# Patient Record
Sex: Female | Born: 1935 | Race: White | Hispanic: No | Marital: Married | Smoking: Never smoker
Health system: Southern US, Community
[De-identification: ages and names within clinical notes are randomized; demographics above are authoritative.]

## PROBLEM LIST (undated history)

## (undated) DIAGNOSIS — G0490 Encephalitis and encephalomyelitis, unspecified: Secondary | ICD-10-CM

## (undated) DIAGNOSIS — H3530 Unspecified macular degeneration: Secondary | ICD-10-CM

## (undated) DIAGNOSIS — F329 Major depressive disorder, single episode, unspecified: Secondary | ICD-10-CM

## (undated) DIAGNOSIS — C44311 Basal cell carcinoma of skin of nose: Secondary | ICD-10-CM

## (undated) DIAGNOSIS — E559 Vitamin D deficiency, unspecified: Secondary | ICD-10-CM

## (undated) DIAGNOSIS — K222 Esophageal obstruction: Secondary | ICD-10-CM

## (undated) DIAGNOSIS — N390 Urinary tract infection, site not specified: Secondary | ICD-10-CM

## (undated) DIAGNOSIS — F32A Depression, unspecified: Secondary | ICD-10-CM

## (undated) DIAGNOSIS — Z780 Asymptomatic menopausal state: Secondary | ICD-10-CM

## (undated) DIAGNOSIS — K5790 Diverticulosis of intestine, part unspecified, without perforation or abscess without bleeding: Secondary | ICD-10-CM

## (undated) DIAGNOSIS — M8580 Other specified disorders of bone density and structure, unspecified site: Secondary | ICD-10-CM

## (undated) DIAGNOSIS — K52832 Lymphocytic colitis: Secondary | ICD-10-CM

## (undated) DIAGNOSIS — K2210 Ulcer of esophagus without bleeding: Secondary | ICD-10-CM

## (undated) DIAGNOSIS — K2270 Barrett's esophagus without dysplasia: Secondary | ICD-10-CM

## (undated) DIAGNOSIS — J189 Pneumonia, unspecified organism: Secondary | ICD-10-CM

## (undated) DIAGNOSIS — T7840XA Allergy, unspecified, initial encounter: Secondary | ICD-10-CM

## (undated) DIAGNOSIS — K760 Fatty (change of) liver, not elsewhere classified: Secondary | ICD-10-CM

## (undated) DIAGNOSIS — R011 Cardiac murmur, unspecified: Secondary | ICD-10-CM

## (undated) DIAGNOSIS — M40209 Unspecified kyphosis, site unspecified: Secondary | ICD-10-CM

## (undated) DIAGNOSIS — K589 Irritable bowel syndrome without diarrhea: Secondary | ICD-10-CM

## (undated) DIAGNOSIS — K449 Diaphragmatic hernia without obstruction or gangrene: Secondary | ICD-10-CM

## (undated) DIAGNOSIS — I10 Essential (primary) hypertension: Secondary | ICD-10-CM

## (undated) DIAGNOSIS — E876 Hypokalemia: Secondary | ICD-10-CM

## (undated) DIAGNOSIS — Z860101 Personal history of adenomatous and serrated colon polyps: Secondary | ICD-10-CM

## (undated) DIAGNOSIS — N952 Postmenopausal atrophic vaginitis: Secondary | ICD-10-CM

## (undated) DIAGNOSIS — C44321 Squamous cell carcinoma of skin of nose: Secondary | ICD-10-CM

## (undated) DIAGNOSIS — K253 Acute gastric ulcer without hemorrhage or perforation: Secondary | ICD-10-CM

## (undated) DIAGNOSIS — E785 Hyperlipidemia, unspecified: Secondary | ICD-10-CM

## (undated) DIAGNOSIS — K219 Gastro-esophageal reflux disease without esophagitis: Secondary | ICD-10-CM

## (undated) DIAGNOSIS — R251 Tremor, unspecified: Secondary | ICD-10-CM

## (undated) DIAGNOSIS — F419 Anxiety disorder, unspecified: Secondary | ICD-10-CM

## (undated) DIAGNOSIS — Z86010 Personal history of colonic polyps: Secondary | ICD-10-CM

## (undated) DIAGNOSIS — H269 Unspecified cataract: Secondary | ICD-10-CM

## (undated) HISTORY — PX: CATARACT EXTRACTION W/ INTRAOCULAR LENS  IMPLANT, BILATERAL: SHX1307

## (undated) HISTORY — PX: POLYPECTOMY: SHX149

## (undated) HISTORY — DX: Diverticulosis of intestine, part unspecified, without perforation or abscess without bleeding: K57.90

## (undated) HISTORY — PX: BREAST BIOPSY: SHX20

## (undated) HISTORY — PX: APPENDECTOMY: SHX54

## (undated) HISTORY — DX: Tremor, unspecified: R25.1

## (undated) HISTORY — DX: Irritable bowel syndrome, unspecified: K58.9

## (undated) HISTORY — PX: COLONOSCOPY: SHX174

## (undated) HISTORY — PX: ESOPHAGOGASTRODUODENOSCOPY (EGD) WITH ESOPHAGEAL DILATION: SHX5812

## (undated) HISTORY — DX: Vitamin D deficiency, unspecified: E55.9

## (undated) HISTORY — DX: Personal history of colonic polyps: Z86.010

## (undated) HISTORY — DX: Encephalitis and encephalomyelitis, unspecified: G04.90

## (undated) HISTORY — PX: EYE SURGERY: SHX253

## (undated) HISTORY — DX: Asymptomatic menopausal state: Z78.0

## (undated) HISTORY — PX: SQUAMOUS CELL CARCINOMA EXCISION: SHX2433

## (undated) HISTORY — DX: Anxiety disorder, unspecified: F41.9

## (undated) HISTORY — DX: Unspecified cataract: H26.9

## (undated) HISTORY — DX: Postmenopausal atrophic vaginitis: N95.2

## (undated) HISTORY — DX: Other specified disorders of bone density and structure, unspecified site: M85.80

## (undated) HISTORY — DX: Hyperlipidemia, unspecified: E78.5

## (undated) HISTORY — DX: Fatty (change of) liver, not elsewhere classified: K76.0

## (undated) HISTORY — DX: Lymphocytic colitis: K52.832

## (undated) HISTORY — DX: Unspecified macular degeneration: H35.30

## (undated) HISTORY — DX: Acute gastric ulcer without hemorrhage or perforation: K25.3

## (undated) HISTORY — PX: BACK SURGERY: SHX140

## (undated) HISTORY — DX: Barrett's esophagus without dysplasia: K22.70

## (undated) HISTORY — PX: LUMBAR DISC SURGERY: SHX700

## (undated) HISTORY — DX: Allergy, unspecified, initial encounter: T78.40XA

## (undated) HISTORY — DX: Urinary tract infection, site not specified: N39.0

## (undated) HISTORY — DX: Esophageal obstruction: K22.2

## (undated) HISTORY — DX: Personal history of adenomatous and serrated colon polyps: Z86.0101

## (undated) HISTORY — DX: Unspecified kyphosis, site unspecified: M40.209

## (undated) HISTORY — DX: Diaphragmatic hernia without obstruction or gangrene: K44.9

## (undated) HISTORY — DX: Hypokalemia: E87.6

---

## 1978-05-11 HISTORY — PX: ABDOMINAL HYSTERECTOMY: SHX81

## 1997-09-18 ENCOUNTER — Other Ambulatory Visit: Admission: RE | Admit: 1997-09-18 | Discharge: 1997-09-18 | Payer: Self-pay | Admitting: Obstetrics & Gynecology

## 1998-07-21 ENCOUNTER — Emergency Department (HOSPITAL_COMMUNITY): Admission: EM | Admit: 1998-07-21 | Discharge: 1998-07-21 | Payer: Self-pay | Admitting: Emergency Medicine

## 1998-07-21 ENCOUNTER — Encounter: Payer: Self-pay | Admitting: Emergency Medicine

## 1999-01-16 ENCOUNTER — Other Ambulatory Visit: Admission: RE | Admit: 1999-01-16 | Discharge: 1999-01-16 | Payer: Self-pay | Admitting: Internal Medicine

## 1999-01-16 ENCOUNTER — Encounter (INDEPENDENT_AMBULATORY_CARE_PROVIDER_SITE_OTHER): Payer: Self-pay | Admitting: Specialist

## 1999-03-11 ENCOUNTER — Other Ambulatory Visit: Admission: RE | Admit: 1999-03-11 | Discharge: 1999-03-11 | Payer: Self-pay | Admitting: Obstetrics and Gynecology

## 2000-09-02 ENCOUNTER — Other Ambulatory Visit: Admission: RE | Admit: 2000-09-02 | Discharge: 2000-09-02 | Payer: Self-pay | Admitting: Obstetrics and Gynecology

## 2003-05-09 ENCOUNTER — Emergency Department (HOSPITAL_COMMUNITY): Admission: EM | Admit: 2003-05-09 | Discharge: 2003-05-09 | Payer: Self-pay | Admitting: Emergency Medicine

## 2003-05-12 DIAGNOSIS — G0490 Encephalitis and encephalomyelitis, unspecified: Secondary | ICD-10-CM

## 2003-05-12 HISTORY — DX: Encephalitis and encephalomyelitis, unspecified: G04.90

## 2003-05-17 ENCOUNTER — Inpatient Hospital Stay (HOSPITAL_COMMUNITY): Admission: EM | Admit: 2003-05-17 | Discharge: 2003-05-23 | Payer: Self-pay | Admitting: Emergency Medicine

## 2003-05-17 ENCOUNTER — Encounter (INDEPENDENT_AMBULATORY_CARE_PROVIDER_SITE_OTHER): Payer: Self-pay | Admitting: Specialist

## 2004-04-18 ENCOUNTER — Ambulatory Visit (HOSPITAL_COMMUNITY): Admission: RE | Admit: 2004-04-18 | Discharge: 2004-04-18 | Payer: Self-pay | Admitting: Family Medicine

## 2007-07-12 ENCOUNTER — Ambulatory Visit: Payer: Self-pay | Admitting: Internal Medicine

## 2007-07-26 ENCOUNTER — Ambulatory Visit: Payer: Self-pay | Admitting: Internal Medicine

## 2007-09-28 ENCOUNTER — Encounter: Admission: RE | Admit: 2007-09-28 | Discharge: 2007-09-28 | Payer: Self-pay | Admitting: Family Medicine

## 2009-03-04 ENCOUNTER — Encounter: Admission: RE | Admit: 2009-03-04 | Discharge: 2009-03-04 | Payer: Self-pay | Admitting: Family Medicine

## 2009-05-29 ENCOUNTER — Encounter: Admission: RE | Admit: 2009-05-29 | Discharge: 2009-08-27 | Payer: Self-pay | Admitting: Neurosurgery

## 2010-01-09 LAB — HM PAP SMEAR

## 2010-02-08 LAB — HM DIABETES EYE EXAM

## 2010-08-24 ENCOUNTER — Encounter: Payer: Self-pay | Admitting: Family Medicine

## 2010-08-24 DIAGNOSIS — M8580 Other specified disorders of bone density and structure, unspecified site: Secondary | ICD-10-CM | POA: Insufficient documentation

## 2010-08-24 DIAGNOSIS — N390 Urinary tract infection, site not specified: Secondary | ICD-10-CM | POA: Insufficient documentation

## 2010-08-24 DIAGNOSIS — Z780 Asymptomatic menopausal state: Secondary | ICD-10-CM | POA: Insufficient documentation

## 2010-08-24 DIAGNOSIS — N952 Postmenopausal atrophic vaginitis: Secondary | ICD-10-CM

## 2010-08-24 DIAGNOSIS — K635 Polyp of colon: Secondary | ICD-10-CM

## 2010-08-24 DIAGNOSIS — M40209 Unspecified kyphosis, site unspecified: Secondary | ICD-10-CM | POA: Insufficient documentation

## 2010-08-24 DIAGNOSIS — K5790 Diverticulosis of intestine, part unspecified, without perforation or abscess without bleeding: Secondary | ICD-10-CM

## 2010-08-24 DIAGNOSIS — G039 Meningitis, unspecified: Secondary | ICD-10-CM

## 2010-08-24 DIAGNOSIS — R251 Tremor, unspecified: Secondary | ICD-10-CM

## 2010-09-26 NOTE — Procedures (Signed)
REFERRING PHYSICIAN:  Jackie Plum, M.D.   CLINICAL HISTORY:  A 77 year old lady with headache and speech and word  finding difficulties and meningoencephalitis.   MEDICATIONS LISTED:  None.   This is a 4-table, 17-channel EEG done using standard 10/20 electrode  placement.   The posterior dominant rhythm consists of 8-9 Hz alpha which is admixed with  slow __________ activity.  Intermittent _________ activity seen bilaterally  in a diffuse distribution, most prominently in the frontal head regions.  No  paroxysmal epileptiform activity, spikes, or sharp waves were seen.  Sleep  changes were not seen in this tracing, except for mild drowsiness.  The  length of the tracing was 22.1 minutes.  Technical component was adequate.  The EKG tracing showed sinus rhythm.   IMPRESSION:  This electroencephalography performed during awake, full sleep,  and drowsiness is abnormal due to presence of mild generalized slowing,  which indicative of mild bihemispheric dysfunction.  This is a nonspecific  finding.  No definite epileptiform activities are identified.    Marney Doctor, MD   FAO:ZHYQ  D:  11/09/2003 19:20:08  T:  11/09/2003 19:52:57  Job #:  [id number]

## 2010-09-26 NOTE — Discharge Summary (Signed)
NAME:  Rachel Walton, Rachel Walton                          ACCOUNT NO.:  [id number]   MEDICAL RECORD NO.:  [id number]                   PATIENT TYPE:  INP   LOCATION:  0465                                 FACILITY:  [facility]   PHYSICIAN:  Jackie Plum, M.D.             DATE OF BIRTH:  1929-08-19   DATE OF ADMISSION:  05/17/2003  DATE OF DISCHARGE:  05/23/2003                                 DISCHARGE SUMMARY   CHIEF COMPLAINT:  Altered mental status.   HISTORY OF PRESENT ILLNESS:  The patient is a 77 year old Caucasian female  with a history of sinusitis who presented to the ED with a mental status  change. She has some problems over the last one week or so with recognition  of close family members as well as some difficulties with her language. She  was seen at [facility] for sinusitis and urinary tract infection  and treated appropriately with antibiotics. According to the family she has  some decreased appetite and now a fever. She has some cough with rarely  sputum production, otherwise no history of GI, cardiac, or significant  urinary symptomatology was obtained. At the ED the patient's initial  temperature was 98.1 degrees Fahrenheit, however it peaked to a temperature  of up to 102.8. In view of her mental status change with fever were are  asked to evaluate for admission.   PAST MEDICAL HISTORY:  The family denies any history of diabetes or heart  disease. I do not see any medications at the moment.   ALLERGIES:  No known drug allergies.   FAMILY HISTORY:  Positive for stroke in mother and heart disease in her  father.   SOCIAL HISTORY:  The patient does not smoke cigarettes nor drink alcohol.  She lives with her husband to whom she has been married for 45 years. She is  a retired Engineer, site.   PHYSICAL EXAMINATION:  VITAL SIGNS:  Blood pressure 136/56, temperature  102.8 degrees Fahrenheit, pulse rate 93 per minute, respiratory rate 20, O2  saturation of 99% on  room air.  GENERAL:  This admission she was not acutely ill appearing. She was alert  with incessant bobbing of her head.  HEENT:  Pupils equal, round, and reactive to light. Extraocular movements  were intact. Oropharynx was benign.  NECK:  Supple. No JVD.  LUNGS:  Clear to auscultation.  CARDIAC:  Regular rate and rhythm without any gallops or murmur.  ABDOMEN:  Soft, nontender. _________  was unremarkable.  NEUROLOGIC:  She was alert and follows commands appropriately, however, she  seemed not able to name objects and could not recognize family members. Her  language was not fluent. Otherwise there were no other acute neurologic  deficits.   LABORATORY DATA:  CT scan of the had obtained in the ED was unremarkable.  WBC was 11.4, hemoglobin 14.1, hematocrit 14.5, MCV 86.9. Her platelet count  291. UA:  Color was yellow, appearance cloudy, specific gravity 1.004, pH  6.0, glucose negative, hemoglobin negative, bilirubin negative, ketones  negative, protein negative, nitrite negative, moderate leukocyte esterase.  WBCs 0-2. Sodium 133, protein 4.0, chloride 105, CO2 27, glucose 95, BUN 7,  creatinine 0.9, calcium 8.9. Total protein 6.8, albumin 3.9, AST 17, ALT 13.  Alkaline phosphatase 52, total bilirubin 0.8. Urine and blood cultures have  been obtained in the ED. EKG has been ordered and is pending.   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 No evidence  of acute meningismus.   IMPRESSION:  Meningitis/encephalitis.   PLAN:  The patient will be admitted to a telemetry bed to obtain and MRI of  the brain and also lumbar puncture. Neurology will be consulted to help  elucidate the etiology of the patient's neurologic symptomatology.                                               Jackie Plum, M.D.    GO/MEDQ  D:  07/30/2003  T:  07/30/2003  Job:  [id number]

## 2010-09-26 NOTE — Op Note (Signed)
NAME:  STEVEN, VEAZIE                          ACCOUNT NO.:  [id number]   MEDICAL RECORD NO.:  [id number]                   PATIENT TYPE:  INP   LOCATION:  0465                                 FACILITY:  [facility]   PHYSICIAN:  Deanna Artis. Sharene Skeans, M.D.           DATE OF BIRTH:  05/21/1929   DATE OF PROCEDURE:  05/17/2003  DATE OF DISCHARGE:                                 OPERATIVE REPORT   PROCEDURE:  Lumbar puncture.   INDICATIONS FOR PROCEDURE:  Fever and altered mental status.   DESCRIPTION OF PROCEDURE:  After informed consent, the patient was sterilely  prepped and draped.  Local anesthesia was applied in the cutaneous tissues  overlying the L3-4 interspace.  Multiple passes were needed to penetrate the  subarachnoid space. Despite this it was apparently not traumatic on the  basis of fluid that came back.  Opening pressure was 21 cm of water, 13 mL  of clear colored fluid was obtained, closing pressure 8 cm of water.  The  patient tolerated the procedure well.  Fluid was sent for culture of  bacteria, fungus, and TB, Gram stain, acid fast and fungal stain, glucose  protein, VDRL, cryptococcal antigen, cell count and differential, 1433  protein for celiaca and EB42/talc protein for Alzheimer's as well as  herpes simplex by PCR.  The patient tolerated the procedure well.                                               Deanna Artis. Sharene Skeans, M.D.    [facility]  D:  05/17/2003  T:  05/18/2003  Job:  [id number]

## 2010-09-26 NOTE — Consult Note (Signed)
NAME:  Rachel Walton                          ACCOUNT NO.:  [id number]   MEDICAL RECORD NO.:  [id number]                   PATIENT TYPE:  INP   LOCATION:  0465                                 FACILITY:  [facility]   PHYSICIAN:  Deanna Artis. Sharene Skeans, M.D.           DATE OF BIRTH:  19-Dec-1929   DATE OF CONSULTATION:  05/17/2003  DATE OF DISCHARGE:                                   CONSULTATION   CHIEF COMPLAINT:  Altered mental status and fever.   I was asked by Dr. Julio Sicks to see Rachel Walton, a 77 year old married  white woman who has had a two week history of difficulty with her language.  This basically took the form of problems with naming, first involved proper  names of family members. The problem seemed to be somewhat intermittent.  Her husband states that the last time he had a substantial conversation with  her was Sunday, five days ago.   The patient has had a three-day history of decreased appetite and increasing  difficulty with her language. During this two-week period she encouraged her  husband to write checks for bills that had to be paid. For their entire  married life she has written almost all the checks. As far as the activities  of daily living, she has continued to dress and keep up with her hygiene, to  cook, although she has had little appetite. She has been able to come and go  including driving her automobile.   Her son noted problems with language and wondered if this was not some form  of dementia.  I cannot obtain a history of dysfunction in other areas that  would suggest that this is the case.   The patient presented to the emergency room tonight with a temperature of  101 and it has since gone up to 102.6. She has not had meningismus, but she  has significant problems with anomia. I was asked to see her to determine  etiology of her dysfunction and to make recommendations for further workup  and treatment.   REVIEW OF SYSTEMS:  The patient has had a  nonproductive cough which was  evident tonight. She has had no coryza, nausea, vomiting, diarrhea, rash.  She does not have problems with arthritis, although she did have lumbar  spondylosis in the past.  She has had no problems with anemia, bruisability,  diabetes, thyroid disease. No known environmental allergies or autoimmune  conditions.  The patient has not had problems with shortness of breath,  chest pain, palpitations, hypertension, dyslipidemia,. She has not had  significant problems with headache. She has not had obvious problems with  sleep. The only constitutional symptom is anorexia.   PAST MEDICAL HISTORY:  The patient has been basically a healthy person. She  was seen twice in the [facility] emergency room; first  for what was thought to be a sinusitis and the second time for  a urinary  tract infection.  She was quite anxious both times. She was seen by Dr. Vernon Prey who took her off any antibiotics after assessing the patient.  We do  not know whether there was any concern about her mental state as voiced on  her two visits despite the fact the  family was noticing problems.   PAST SURGICAL HISTORY:  The patient had lumbar laminectomy, breast biopsy,  appendectomy.   CURRENT MEDICATIONS:  None.   DRUG ALLERGIES:  None.   IMMUNIZATIONS:  Up-to-date.   FAMILY HISTORY:  Her mother died in her 72s of a stroke. Father died in his  92s of a heart attack.   SOCIAL HISTORY:  The patient does not use tobacco or alcohol. She has been  married for 45 years.  She has children and grandchildren.  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   PHYSICAL EXAMINATION:  GENERAL: On examination today, a well-developed, well-  nourished woman in no acute distress, lying in bed, shivering under her  covers. 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  VITAL SIGNS: Temperature 102.6, blood pressure 128/47, resting  pulse 96,  respirations 16.  ENT: No signs of infection.  NECK: Supple neck. No bruits.  LUNGS: Clear.  HEART: No murmurs. Pulses normal.  ABDOMEN: Soft, bowel sounds normal. No hepatosplenomegaly.  EXTREMITIES: Unremarkable.  NEUROLOGIC: The patient is awake and alert. She follows commands. She is  unable to name objects. She tells me her name and her husband's name and can  count fingers. She speaks in social phrases, but she does not speak fluently  in spontaneous language, and has perseveration. Cranial nerves reveal round  and reactive pupils. Visual fields full. Normal fundi. Symmetric facial  strength. Midline tongue and uvula. Air conduction greater than bone  conduction bilaterally. Motor examination reveals normal strength, tone, and  mass. Good fine motor movements. No pronator drift.  Sensation intact to  cold.  Anoxic stimuli. I cannot test her agnosia  because she cannot name  objects. On cerebellar examination, finger-to-nose and repetitive movements  are okay. Gait is not tested. Deep tendon reflexes are diminished to absent,  except at the knees which are normal. Toes are bilaterally flexor.   IMPRESSION:  1. Anomic aphasia, unknown etiology. No obvious abnormality on computer     tomography scan.  2. Fever without meningismus. The combination of altered mental status with     fever raises the possibility of encephalitis, despite the fact the     patient does not have meningismus. We will search for herpes and for     other etiologies of altered nervous system function. We cannot rule out     the possibility of a left anterior insular cortex infarction that would     cause dysnomia without other obvious nervous system findings. I doubt     that this patient's problem is functional.   PLAN:  The patient will have a lumbar puncture.  Will perform an MRI scan of  the brain with and without contrast, an EEG, and have laboratories not only of the CSF but serum looking  for treatable cause of dementia and delirium.  The patient will be followed in my absence by my partners from [facility].  Deanna Artis. Sharene Skeans, M.D.    [facility]  D:  05/17/2003  T:  05/18/2003  Job:  [id number]   cc:   Ernestina Penna, M.D.  [address] [place]  [place] [address]  Fax: [phone]   Jackie Plum, M.D.

## 2010-09-26 NOTE — Discharge Summary (Signed)
NAME:  Rachel Walton, Rachel Walton                          ACCOUNT NO.:  [id number]   MEDICAL RECORD NO.:  [id number]                   PATIENT TYPE:  INP   LOCATION:  0465                                 FACILITY:  [facility]   PHYSICIAN:  Deirdre Peer. Polite, M.D.              DATE OF BIRTH:  03-17-1930   DATE OF ADMISSION:  05/17/2003  DATE OF DISCHARGE:  05/23/2003                                 DISCHARGE SUMMARY   DISCHARGE DIAGNOSES:  1. 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  CSF culture negative, AFB smear negative.  2. Group B strep GGI 20,000 colonies treated empirically.  3. Fever resolved, presumed secondary to #1.   DISCHARGE MEDICATIONS:  Tylenol p.r.n. as indicated.   CONSULTANTS:  1. Dr. Ninetta Lights, Infectious Disease.  2. Dr. Sharene Skeans, Neurology.   DISPOSITION:  This patient is discharged home in stable condition and is to  follow up with her primary M.D. in 1-2 weeks and asked to follow up with Dr.  Ellison Carwin in 1-2 weeks as needed.   STUDIES:  Patient had a lumbar puncture, cerebrospinal fluid culture is  negative.  AFB smear negative.  Fungal smear negative.  Urine culture  positive group B strep 20,000 colonies.  Blood culture negative x2.   MRI of the brain - no evidence of infarct, no altered signal intensity in  the typical distribution of midline encephalitis/encephalopathy.  Bilateral  mastoid air cell opacification, left greater than right.  No obvious  intracranial extension.   CT of the head - no acute intracranial abnormalities, mild diffuse atrophy  and mild changes of small vessel disease of the white matter.   Chest x-ray - no acute disease.   HISTORY OF PRESENT ILLNESS:  Rachel Walton is a 77 year old female who was  brought to the ED for having problems with naming and subsequent fever and  confusion.  In the ED the patient was seen and evaluated, and because of  progressive confusion and fever, admission was deemed  necessary for further  evaluation and treatment.  Please see dictated HPI for further details of  history of present illness.   PAST MEDICAL HISTORY:  Essentially unremarkable.   MEDICATIONS ON ADMISSION:  None.   PAST SURGICAL HISTORY:  Patient has history of lumbar laminectomy, breast  biopsy and appendectomy.   ALLERGIES:  No known drug allergies.   FAMILY HISTORY:  Noncontributory.   HOSPITAL COURSE:  Patient was admitted to a medicine floor bed for  evaluation and treatment of fever and mental status change.  She was seen in  consultation by Neurology and Infectious Disease.  Patient underwent several  studies as outlined in the data section.  She had an LP, final culture was  negative.  Patient also had CT and MRI as stated above.  Patient was treated  empirically with antibiotics while awaiting results of culture.  Patient's  CBC and  BMET were within normal limits throughout this hospitalization.  TSH  0.8.  Vitamin B12 and folate within normal limits.  CSF had glucose of 59,  total protein of 75.  RPR was nonreactive.  VDRL nonreactive.  Herpes  simplex virus __________ was negative as well as cryptococcal antigen.  Patient's course improved dramatically throughout her hospitalization and  was ultimately deemed that antibiotics could be stopped after 7 days and  that patient would be stable for discharge.  The patient was seen by PT and  OT and there were no needs identified.  At this time, patient is given a  diagnosis of meningoencephalitis and is felt stable for discharge, and at  this time cultures are negative.  Patient has been informed to follow up  with her primary M.D. and to follow up with Dr. Sharene Skeans.                                               Deirdre Peer. Polite, M.D.    RDP/MEDQ  D:  05/23/2003  T:  05/23/2003  Job:  [id number]   cc:   Ernestina Penna, M.D.  [address] [place]  [place] [address]  Fax: [phone]

## 2011-04-23 ENCOUNTER — Other Ambulatory Visit: Payer: Self-pay | Admitting: Obstetrics and Gynecology

## 2011-04-23 DIAGNOSIS — N6459 Other signs and symptoms in breast: Secondary | ICD-10-CM

## 2011-04-27 ENCOUNTER — Ambulatory Visit: Payer: Self-pay | Admitting: Physical Therapy

## 2011-05-07 ENCOUNTER — Ambulatory Visit
Admission: RE | Admit: 2011-05-07 | Discharge: 2011-05-07 | Disposition: A | Discharge status: Home / Self Care | Payer: BLUE CROSS/BLUE SHIELD | Source: Ambulatory Visit | Attending: Obstetrics and Gynecology | Admitting: Obstetrics and Gynecology

## 2011-05-07 ENCOUNTER — Other Ambulatory Visit: Payer: Self-pay | Admitting: Obstetrics and Gynecology

## 2011-05-07 DIAGNOSIS — N6459 Other signs and symptoms in breast: Secondary | ICD-10-CM

## 2011-09-04 ENCOUNTER — Other Ambulatory Visit: Payer: Self-pay | Admitting: Obstetrics and Gynecology

## 2011-09-04 DIAGNOSIS — Z1231 Encounter for screening mammogram for malignant neoplasm of breast: Secondary | ICD-10-CM

## 2011-09-07 ENCOUNTER — Ambulatory Visit: Payer: Medicare Other | Attending: Orthopedic Surgery | Admitting: Physical Therapy

## 2011-09-07 DIAGNOSIS — IMO0001 Reserved for inherently not codable concepts without codable children: Secondary | ICD-10-CM | POA: Insufficient documentation

## 2011-09-07 DIAGNOSIS — M25619 Stiffness of unspecified shoulder, not elsewhere classified: Secondary | ICD-10-CM | POA: Insufficient documentation

## 2011-09-07 DIAGNOSIS — M25519 Pain in unspecified shoulder: Secondary | ICD-10-CM | POA: Insufficient documentation

## 2011-09-08 ENCOUNTER — Ambulatory Visit: Payer: Medicare Other | Admitting: Physical Therapy

## 2011-09-10 ENCOUNTER — Ambulatory Visit: Payer: Medicare Other | Attending: Orthopedic Surgery | Admitting: Physical Therapy

## 2011-09-10 DIAGNOSIS — M25619 Stiffness of unspecified shoulder, not elsewhere classified: Secondary | ICD-10-CM | POA: Insufficient documentation

## 2011-09-10 DIAGNOSIS — M25519 Pain in unspecified shoulder: Secondary | ICD-10-CM | POA: Insufficient documentation

## 2011-09-10 DIAGNOSIS — IMO0001 Reserved for inherently not codable concepts without codable children: Secondary | ICD-10-CM | POA: Insufficient documentation

## 2011-09-14 ENCOUNTER — Ambulatory Visit: Payer: Medicare Other | Admitting: Physical Therapy

## 2011-09-15 ENCOUNTER — Ambulatory Visit: Payer: Medicare Other | Admitting: *Deleted

## 2011-09-17 ENCOUNTER — Ambulatory Visit: Payer: Medicare Other | Admitting: Physical Therapy

## 2011-09-18 ENCOUNTER — Ambulatory Visit
Admission: RE | Admit: 2011-09-18 | Discharge: 2011-09-18 | Disposition: A | Discharge status: Home / Self Care | Payer: Medicare Other | Source: Ambulatory Visit | Attending: Obstetrics and Gynecology | Admitting: Obstetrics and Gynecology

## 2011-09-18 DIAGNOSIS — Z1231 Encounter for screening mammogram for malignant neoplasm of breast: Secondary | ICD-10-CM

## 2011-09-29 ENCOUNTER — Ambulatory Visit: Payer: Medicare Other | Admitting: Physical Therapy

## 2011-10-01 ENCOUNTER — Ambulatory Visit: Payer: Medicare Other | Admitting: Physical Therapy

## 2011-10-06 ENCOUNTER — Ambulatory Visit: Payer: Medicare Other | Admitting: Physical Therapy

## 2011-10-08 ENCOUNTER — Ambulatory Visit: Payer: Medicare Other | Admitting: Physical Therapy

## 2012-04-26 ENCOUNTER — Other Ambulatory Visit: Payer: Self-pay | Admitting: Obstetrics and Gynecology

## 2012-04-26 DIAGNOSIS — N6459 Other signs and symptoms in breast: Secondary | ICD-10-CM

## 2012-04-26 DIAGNOSIS — N644 Mastodynia: Secondary | ICD-10-CM

## 2012-05-05 ENCOUNTER — Other Ambulatory Visit: Payer: Medicare Other

## 2012-05-06 ENCOUNTER — Ambulatory Visit
Admission: RE | Admit: 2012-05-06 | Discharge: 2012-05-06 | Disposition: A | Discharge status: Home / Self Care | Payer: Medicare Other | Source: Ambulatory Visit | Attending: Obstetrics and Gynecology | Admitting: Obstetrics and Gynecology

## 2012-05-06 DIAGNOSIS — N644 Mastodynia: Secondary | ICD-10-CM

## 2012-05-06 DIAGNOSIS — N6459 Other signs and symptoms in breast: Secondary | ICD-10-CM

## 2012-07-14 ENCOUNTER — Encounter: Payer: Self-pay | Admitting: Internal Medicine

## 2012-07-22 ENCOUNTER — Telehealth: Payer: Self-pay | Admitting: Internal Medicine

## 2012-07-22 ENCOUNTER — Encounter: Payer: Self-pay | Admitting: *Deleted

## 2012-07-22 NOTE — Telephone Encounter (Signed)
Reviewed. It is just a recommendation based on the quide lines for pt's with high risk for colon cancer. Her sibling had colon cancer and she had polyps in the past, but not on the last colon). The office visit is for [place] to discuss it. It is up to her.

## 2012-07-22 NOTE — Telephone Encounter (Signed)
Spoke with patient and she wants to know if she really needs to have OV and recall colonoscopy given her age. (Previous colonoscopy reports are scanned under media) Please, advise.

## 2012-07-22 NOTE — Telephone Encounter (Signed)
Spoke with patient and gave her Dr. Regino Schultze recommendations. She would like to keep the appointment to discuss.

## 2012-08-04 ENCOUNTER — Encounter: Payer: Self-pay | Admitting: *Deleted

## 2012-08-24 ENCOUNTER — Other Ambulatory Visit: Payer: Self-pay | Admitting: Ophthalmology

## 2012-08-31 ENCOUNTER — Encounter: Payer: Self-pay | Admitting: Internal Medicine

## 2012-09-09 ENCOUNTER — Ambulatory Visit: Payer: Medicare Other | Admitting: Internal Medicine

## 2012-10-21 ENCOUNTER — Ambulatory Visit: Payer: Medicare Other | Admitting: Internal Medicine

## 2012-11-17 ENCOUNTER — Encounter: Payer: Self-pay | Admitting: Nurse Practitioner

## 2012-11-17 ENCOUNTER — Ambulatory Visit: Payer: Self-pay | Admitting: General Practice

## 2012-11-17 ENCOUNTER — Ambulatory Visit (INDEPENDENT_AMBULATORY_CARE_PROVIDER_SITE_OTHER): Payer: Medicare Other | Admitting: Nurse Practitioner

## 2012-11-17 VITALS — BP 136/60 | HR 63 | Temp 97.7°F | Ht 62.0 in | Wt 145.0 lb

## 2012-11-17 DIAGNOSIS — L237 Allergic contact dermatitis due to plants, except food: Secondary | ICD-10-CM

## 2012-11-17 DIAGNOSIS — L255 Unspecified contact dermatitis due to plants, except food: Secondary | ICD-10-CM

## 2012-11-17 MED ORDER — METHYLPREDNISOLONE ACETATE 80 MG/ML IJ SUSP
80.0000 mg | Freq: Once | INTRAMUSCULAR | Status: AC
  Administered 2012-11-17: 80 mg via INTRAMUSCULAR

## 2012-11-17 NOTE — Patient Instructions (Signed)
Poison Oak Poison oak is an inflammation of the skin (contact dermatitis). It is caused by contact with the allergens on the leaves of the oak (toxicodendron) plants. Depending on your sensitivity, the rash may consist simply of redness and itching, or it may also progress to blisters which may break open (rupture). These must be well cared for to prevent secondary germ (bacterial) infection as these infections can lead to scarring. The eyes may also get puffy. The puffiness is worst in the morning and gets better as the day progresses. Healing is best accomplished by keeping any open areas dry, clean, covered with a bandage, and covered with an antibacterial ointment if needed. Without secondary infection, this dermatitis usually heals without scarring within 2 to 3 weeks without treatment. HOME CARE INSTRUCTIONS When you have been exposed to poison oak, it is very important to thoroughly wash with soap and water as soon as the exposure has been discovered. You have about one half hour to remove the plant resin before it will cause the rash. This cleaning will quickly destroy the oil or antigen on the skin (the antigen is what causes the rash). Wash aggressively under the fingernails as any plant resin still there will continue to spread the rash. Do not rub skin vigorously when washing affected area. Poison oak cannot spread if no oil from the plant remains on your body. Rash that has progressed to weeping sores (lesions) will not spread the rash unless you have not washed thoroughly. It is also important to clean any clothes you have been wearing as they may carry active allergens which will spread the rash, even several days later. Avoidance of the plant in the future is the best measure. Poison oak plants can be recognized by the number of leaves. Generally, poison oak has three leaves with flowering branches on a single stem. Diphenhydramine may be purchased over the counter and used as needed for  itching. Do not drive with this medication if it makes you drowsy. Ask your caregiver about medication for children. SEEK IMMEDIATE MEDICAL CARE IF:   Open areas of the rash develop.  You notice redness extending beyond the area of the rash.  There is a pus like discharge.  There is increased pain.  Other signs of infection develop (such as fever). Document Released: 11/01/2002 Document Revised: 07/20/2011 Document Reviewed: 03/13/2009 [employer] Patient Information 2014 [employer].  

## 2012-11-17 NOTE — Progress Notes (Signed)
  Subjective:    Patient ID: Rachel Walton, female    DOB: [birth year], 79 y.o.   MRN: [id number]  HPI  Patient was doing yard work and got intopoison oak and broke out into a rash- mainly on chest and back- very itchy    Review of Systems  All other systems reviewed and are negative.       Objective:   Physical Exam  Constitutional: She appears well-developed and well-nourished.  Cardiovascular: Normal rate and normal heart sounds.   Pulmonary/Chest: Effort normal and breath sounds normal.  Skin:  Erythematous maculopapular lesion in linear pattern on ant chest back and small area on neck      BP 136/60  Pulse 63  Temp(Src) 97.7 F (36.5 C) (Oral)  Ht 5\' 2"  (1.575 m)  Wt 145 lb (65.772 kg)  BMI 26.51 kg/m2     Assessment & Plan:  1. Contact dermatitis due to poison oak Avoid scratching' Calamine lotion if helps Benadryl OTC for itching Cool compresses - methylPREDNISolone acetate (DEPO-MEDROL) injection 80 mg; Inject 1 mL (80 mg total) into the muscle once.  Mary-Margaret Daphine Deutscher, FNP

## 2012-11-19 ENCOUNTER — Ambulatory Visit (INDEPENDENT_AMBULATORY_CARE_PROVIDER_SITE_OTHER): Payer: Medicare Other | Admitting: Nurse Practitioner

## 2012-11-19 VITALS — BP 140/68 | HR 65 | Temp 97.0°F | Ht 62.0 in | Wt 145.0 lb

## 2012-11-19 DIAGNOSIS — L255 Unspecified contact dermatitis due to plants, except food: Secondary | ICD-10-CM

## 2012-11-19 DIAGNOSIS — L237 Allergic contact dermatitis due to plants, except food: Secondary | ICD-10-CM

## 2012-11-19 MED ORDER — PREDNISONE 20 MG PO TABS: ORAL_TABLET | ORAL | Status: DC

## 2012-11-19 NOTE — Patient Instructions (Signed)
Poison Oak Poison oak is an inflammation of the skin (contact dermatitis). It is caused by contact with the allergens on the leaves of the oak (toxicodendron) plants. Depending on your sensitivity, the rash may consist simply of redness and itching, or it may also progress to blisters which may break open (rupture). These must be well cared for to prevent secondary germ (bacterial) infection as these infections can lead to scarring. The eyes may also get puffy. The puffiness is worst in the morning and gets better as the day progresses. Healing is best accomplished by keeping any open areas dry, clean, covered with a bandage, and covered with an antibacterial ointment if needed. Without secondary infection, this dermatitis usually heals without scarring within 2 to 3 weeks without treatment. HOME CARE INSTRUCTIONS When you have been exposed to poison oak, it is very important to thoroughly wash with soap and water as soon as the exposure has been discovered. You have about one half hour to remove the plant resin before it will cause the rash. This cleaning will quickly destroy the oil or antigen on the skin (the antigen is what causes the rash). Wash aggressively under the fingernails as any plant resin still there will continue to spread the rash. Do not rub skin vigorously when washing affected area. Poison oak cannot spread if no oil from the plant remains on your body. Rash that has progressed to weeping sores (lesions) will not spread the rash unless you have not washed thoroughly. It is also important to clean any clothes you have been wearing as they may carry active allergens which will spread the rash, even several days later. Avoidance of the plant in the future is the best measure. Poison oak plants can be recognized by the number of leaves. Generally, poison oak has three leaves with flowering branches on a single stem. Diphenhydramine may be purchased over the counter and used as needed for  itching. Do not drive with this medication if it makes you drowsy. Ask your caregiver about medication for children. SEEK IMMEDIATE MEDICAL CARE IF:   Open areas of the rash develop.  You notice redness extending beyond the area of the rash.  There is a pus like discharge.  There is increased pain.  Other signs of infection develop (such as fever). Document Released: 11/01/2002 Document Revised: 07/20/2011 Document Reviewed: 03/13/2009 [employer] Patient Information 2014 [employer].  

## 2012-11-19 NOTE — Progress Notes (Signed)
  Subjective:    Patient ID: IJEOMA LOOR, female    DOB: 02-07-1930, 79 y.o.   MRN: [id number]  HPI Patient was seen several days ago with Contact dermatitis from poison oak- she was given depo shot and has improved some- but rash on neck still itching and red.    Review of Systems  All other systems reviewed and are negative.       Objective:   Physical Exam  Constitutional: She is oriented to person, place, and time. She appears well-developed and well-nourished.  Cardiovascular: Normal rate, regular rhythm and normal heart sounds.   Pulmonary/Chest: Effort normal and breath sounds normal.  Neurological: She is alert and oriented to person, place, and time.  Skin:  Erythematous maculo papular rash anterior neck    BP 140/68  Pulse 65  Temp(Src) 97 F (36.1 C) (Oral)  Ht 5\' 2"  (1.575 m)  Wt 145 lb (65.772 kg)  BMI 26.51 kg/m2       Assessment & Plan:  1. Contact dermatitis due to poison oak Avoid scratching 'calamine lotion Benadryl OTC for itching RTO prn  - predniSONE (DELTASONE) 20 MG tablet; Take 2 tablets at the same time daily for 5 days  Dispense: 10 tablet; Refill: 0  Mary-Margaret Daphine Deutscher, FNP

## 2012-11-21 ENCOUNTER — Encounter: Payer: Self-pay | Admitting: Family Medicine

## 2012-11-21 ENCOUNTER — Ambulatory Visit (INDEPENDENT_AMBULATORY_CARE_PROVIDER_SITE_OTHER): Payer: Medicare Other | Admitting: Family Medicine

## 2012-11-21 VITALS — BP 149/77 | HR 67 | Temp 97.1°F | Ht 62.0 in | Wt 144.0 lb

## 2012-11-21 DIAGNOSIS — H3530 Unspecified macular degeneration: Secondary | ICD-10-CM

## 2012-11-21 DIAGNOSIS — E782 Mixed hyperlipidemia: Secondary | ICD-10-CM | POA: Insufficient documentation

## 2012-11-21 DIAGNOSIS — E785 Hyperlipidemia, unspecified: Secondary | ICD-10-CM

## 2012-11-21 DIAGNOSIS — N390 Urinary tract infection, site not specified: Secondary | ICD-10-CM

## 2012-11-21 DIAGNOSIS — M810 Age-related osteoporosis without current pathological fracture: Secondary | ICD-10-CM

## 2012-11-21 DIAGNOSIS — E559 Vitamin D deficiency, unspecified: Secondary | ICD-10-CM

## 2012-11-21 DIAGNOSIS — R259 Unspecified abnormal involuntary movements: Secondary | ICD-10-CM

## 2012-11-21 DIAGNOSIS — R5383 Other fatigue: Secondary | ICD-10-CM

## 2012-11-21 DIAGNOSIS — R251 Tremor, unspecified: Secondary | ICD-10-CM

## 2012-11-21 DIAGNOSIS — R5381 Other malaise: Secondary | ICD-10-CM

## 2012-11-21 LAB — POCT URINALYSIS DIPSTICK
Bilirubin, UA: NEGATIVE
Glucose, UA: NEGATIVE
Ketones, UA: NEGATIVE
Nitrite, UA: NEGATIVE
Protein, UA: NEGATIVE
Spec Grav, UA: <=1.005
Urobilinogen, UA: NEGATIVE
pH, UA: 6

## 2012-11-21 LAB — POCT CBC
Granulocyte percent: 59.3 %G (ref 37–80)
HCT, POC: 43.4 % (ref 37.7–47.9)
Hemoglobin: 15 g/dL (ref 12.2–16.2)
POC Granulocyte: 4.9 (ref 2–6.9)
RBC: 5 M/uL (ref 4.04–5.48)
RDW, POC: 12.5 %
WBC: 8.3 10*3/uL (ref 4.6–10.2)

## 2012-11-21 LAB — POCT UA - MICROSCOPIC ONLY
Crystals, Ur, HPF, POC: NEGATIVE
Epithelial cells, urine per micros: NEGATIVE
Yeast, UA: NEGATIVE

## 2012-11-21 MED ORDER — CLONAZEPAM 0.5 MG PO TABS: 0.5000 mg | ORAL_TABLET | Freq: Every evening | ORAL | Status: DC | PRN

## 2012-11-21 NOTE — Patient Instructions (Addendum)
Fall precautions discussed Continue current meds and therapeutic lifestyle changes Drink plenty of fluids and stay well hydrated this summer Get mammogram as planned Continue to use hydrocortisone cream for the area of contact dermatitis between breasts  Poison [facility] is an inflammation of the skin (contact dermatitis). It is caused by contact with the allergens on the leaves of the oak (toxicodendron) plants. Depending on your sensitivity, the rash may consist simply of redness and itching, or it may also progress to blisters which may break open (rupture). These must be well cared for to prevent secondary germ (bacterial) infection as these infections can lead to scarring. The eyes may also get puffy. The puffiness is worst in the morning and gets better as the day progresses. Healing is best accomplished by keeping any open areas dry, clean, covered with a bandage, and covered with an antibacterial ointment if needed. Without secondary infection, this dermatitis usually heals without scarring within 2 to 3 weeks without treatment. HOME CARE INSTRUCTIONS When you have been exposed to poison oak, it is very important to thoroughly wash with soap and water as soon as the exposure has been discovered. You have about one half hour to remove the plant resin before it will cause the rash. This cleaning will quickly destroy the oil or antigen on the skin (the antigen is what causes the rash). Wash aggressively under the fingernails as any plant resin still there will continue to spread the rash. Do not rub skin vigorously when washing affected area. Poison oak cannot spread if no oil from the plant remains on your body. Rash that has progressed to weeping sores (lesions) will not spread the rash unless you have not washed thoroughly. It is also important to clean any clothes you have been wearing as they may carry active allergens which will spread the rash, even several days later. Avoidance of the  plant in the future is the best measure. Poison oak plants can be recognized by the number of leaves. Generally, poison oak has three leaves with flowering branches on a single stem. Diphenhydramine may be purchased over the counter and used as needed for itching. Do not drive with this medication if it makes you drowsy. Ask your caregiver about medication for children. SEEK IMMEDIATE MEDICAL CARE IF:   Open areas of the rash develop.  You notice redness extending beyond the area of the rash.  There is a pus like discharge.  There is increased pain.  Other signs of infection develop (such as fever). Document Released: 11/01/2002 Document Revised: 07/20/2011 Document Reviewed: 03/13/2009 [facility] Patient Information 2014 [place], [place].

## 2012-11-21 NOTE — Addendum Note (Signed)
Addended by: Orma Render F on: 11/21/2012 04:29 PM   Modules accepted: Orders

## 2012-11-21 NOTE — Progress Notes (Signed)
Subjective:    Patient ID: Rachel Walton, female    DOB: January 01, 1930, 79 y.o.   MRN: [id number]  HPI Patient returns to clinic today for followup and management of chronic medical problems. These include hyperlipidemia and osteoporosis. She also has a tremor for which she is seeing the neurologist . She also has macular degeneration.   Review of Systems  Constitutional: Positive for fatigue (slight).  HENT: Negative.   Eyes: Positive for pain (bilateral) and redness.  Respiratory: Negative.   Cardiovascular: Negative.   Gastrointestinal: Negative.   Endocrine: Negative.   Genitourinary: Negative.   Musculoskeletal: Negative.   Skin: Positive for rash (poison oak on chest, L side).  Allergic/Immunologic: Positive for environmental allergies.  Neurological: Positive for tremors.  Hematological: Negative.   Psychiatric/Behavioral: Negative.        Objective:   Physical Exam BP 149/77  Pulse 67  Temp(Src) 97.1 F (36.2 C) (Oral)  Ht 5\' 2"  (1.575 m)  Wt 144 lb (65.318 kg)  BMI 26.33 kg/m2  The patient appeared well nourished and normally developed, alert and oriented to time and place. Speech, behavior and judgement appear normal. Vital signs as documented.  Head exam is unremarkable. No scleral icterus or pallor noted. Ears nose and throat were normal.  Neck is without jugular venous distension, thyromegally, or carotid bruits. Carotid upstrokes are brisk bilaterally. No cervical adenopathy. Lungs are clear anteriorly and posteriorly to auscultation. Normal respiratory effort. Cardiac exam reveals regular rate and rhythm at 72 per minute. First and second heart sounds normal.  No murmurs, rubs or gallops.  Abdominal exam reveals normal bowl sounds, no masses, no organomegaly and no aortic enlargement. No inguinal adenopathy. Slight tenderness due to the full bladder Extremities are nonedematous and both femoral and pedal pulses are normal. Skin without pallor or jaundice.   Warm and dry, without rash, except for contact dermatitis between both breasts over the sternum. Neurologic exam reveals normal deep tendon reflexes and normal sensation.          Assessment & Plan:  1. Hyperlipemia - Hepatic function panel; Standing - Lipid panel; Standing  2. Tremor - clonazePAM (KLONOPIN) 0.5 MG tablet; Take 1 tablet (0.5 mg total) by mouth at bedtime as needed.  Dispense: 30 tablet; Refill: 5  3. Osteoporosis  4. Macular degeneration  5. Fatigue - POCT CBC; Standing - BASIC METABOLIC PANEL WITH GFR; Standing  6. Vitamin D deficiency - Vitamin D 25 hydroxy; Standing  Patient Instructions  Fall precautions discussed Continue current meds and therapeutic lifestyle changes Drink plenty of fluids and stay well hydrated this summer Get mammogram as planned Continue to use hydrocortisone cream for the area of contact dermatitis between breasts  Poison [facility] is an inflammation of the skin (contact dermatitis). It is caused by contact with the allergens on the leaves of the oak (toxicodendron) plants. Depending on your sensitivity, the rash may consist simply of redness and itching, or it may also progress to blisters which may break open (rupture). These must be well cared for to prevent secondary germ (bacterial) infection as these infections can lead to scarring. The eyes may also get puffy. The puffiness is worst in the morning and gets better as the day progresses. Healing is best accomplished by keeping any open areas dry, clean, covered with a bandage, and covered with an antibacterial ointment if needed. Without secondary infection, this dermatitis usually heals without scarring within 2 to 3 weeks without treatment. HOME CARE INSTRUCTIONS When you  have been exposed to poison oak, it is very important to thoroughly wash with soap and water as soon as the exposure has been discovered. You have about one half hour to remove the plant resin before it  will cause the rash. This cleaning will quickly destroy the oil or antigen on the skin (the antigen is what causes the rash). Wash aggressively under the fingernails as any plant resin still there will continue to spread the rash. Do not rub skin vigorously when washing affected area. Poison oak cannot spread if no oil from the plant remains on your body. Rash that has progressed to weeping sores (lesions) will not spread the rash unless you have not washed thoroughly. It is also important to clean any clothes you have been wearing as they may carry active allergens which will spread the rash, even several days later. Avoidance of the plant in the future is the best measure. Poison oak plants can be recognized by the number of leaves. Generally, poison oak has three leaves with flowering branches on a single stem. Diphenhydramine may be purchased over the counter and used as needed for itching. Do not drive with this medication if it makes you drowsy. Ask your caregiver about medication for children. SEEK IMMEDIATE MEDICAL CARE IF:   Open areas of the rash develop.  You notice redness extending beyond the area of the rash.  There is a pus like discharge.  There is increased pain.  Other signs of infection develop (such as fever). Document Released: 11/01/2002 Document Revised: 07/20/2011 Document Reviewed: 03/13/2009 [facility] Patient Information 2014 [place], [place].

## 2012-11-21 NOTE — Addendum Note (Signed)
Addended by: Orma Render F on: 11/21/2012 04:30 PM   Modules accepted: Orders

## 2012-11-21 NOTE — Addendum Note (Signed)
Addended by: Monica Becton on: 11/21/2012 06:24 PM   Modules accepted: Orders

## 2012-11-22 LAB — URINE CULTURE: Colony Count: NO GROWTH

## 2012-11-22 LAB — BASIC METABOLIC PANEL WITH GFR
CO2: 27 mEq/L (ref 19–32)
Chloride: 104 mEq/L (ref 96–112)
Creat: 0.9 mg/dL (ref 0.50–1.10)

## 2012-11-22 LAB — VITAMIN D 25 HYDROXY (VIT D DEFICIENCY, FRACTURES): Vit D, 25-Hydroxy: 50 ng/mL (ref 30–89)

## 2012-11-22 LAB — LIPID PANEL
Cholesterol: 246 mg/dL — ABNORMAL HIGH (ref 0–200)
Total CHOL/HDL Ratio: 3 Ratio
Triglycerides: 257 mg/dL — ABNORMAL HIGH (ref <150)
VLDL: 51 mg/dL — ABNORMAL HIGH (ref 0–40)

## 2012-11-22 LAB — HEPATIC FUNCTION PANEL
AST: 22 U/L (ref 0–37)
Bilirubin, Direct: 0.1 mg/dL (ref 0.0–0.3)
Indirect Bilirubin: 0.2 mg/dL (ref 0.0–0.9)
Total Bilirubin: 0.3 mg/dL (ref 0.3–1.2)

## 2012-12-06 ENCOUNTER — Other Ambulatory Visit: Payer: Self-pay

## 2012-12-06 DIAGNOSIS — Z1231 Encounter for screening mammogram for malignant neoplasm of breast: Secondary | ICD-10-CM

## 2012-12-12 ENCOUNTER — Encounter: Payer: Self-pay | Admitting: *Deleted

## 2012-12-27 ENCOUNTER — Ambulatory Visit
Admission: RE | Admit: 2012-12-27 | Discharge: 2012-12-27 | Disposition: A | Discharge status: Home / Self Care | Payer: Medicare Other | Source: Ambulatory Visit

## 2012-12-27 DIAGNOSIS — Z1231 Encounter for screening mammogram for malignant neoplasm of breast: Secondary | ICD-10-CM

## 2013-01-03 ENCOUNTER — Encounter: Payer: Self-pay | Admitting: Internal Medicine

## 2013-01-18 ENCOUNTER — Ambulatory Visit (HOSPITAL_COMMUNITY)
Admission: RE | Admit: 2013-01-18 | Discharge: 2013-01-18 | Disposition: A | Discharge status: Home / Self Care | Payer: Medicare Other | Source: Ambulatory Visit | Attending: Family Medicine | Admitting: Family Medicine

## 2013-01-18 ENCOUNTER — Ambulatory Visit (INDEPENDENT_AMBULATORY_CARE_PROVIDER_SITE_OTHER): Payer: Medicare Other | Admitting: Family Medicine

## 2013-01-18 VITALS — BP 181/78 | HR 71 | Temp 98.8°F | Wt 142.0 lb

## 2013-01-18 DIAGNOSIS — R51 Headache: Secondary | ICD-10-CM

## 2013-01-18 DIAGNOSIS — G319 Degenerative disease of nervous system, unspecified: Secondary | ICD-10-CM | POA: Insufficient documentation

## 2013-01-18 NOTE — Progress Notes (Signed)
  Subjective:    Patient ID: Rachel Walton, female    DOB: 03-04-1930, 79 y.o.   MRN: [id number]  HPI This 79 y.o. female presents for evaluation of right eye discomfort And felt like her right eye was drawing up.   She is worried about Having a stroke and she came into be evaluated.  She is having elevated Blood pressures.  She has family hx of CVA.  She has hx blurred vision in Her right eye.  She has hx of bilateral cataracts.  She saw her opthamologist A week ago and was told everything was okay.  She took 2 ASA's this AM.   Review of Systems C/o right periorbital discomfort.   No chest pain, SOB, HA, dizziness, vision change, N/V, diarrhea, constipation, dysuria, urinary urgency or frequency, myalgias, arthralgias or rash.  Objective:   Physical Exam Vital signs noted  Well developed female with tremors.  HEENT - Head atraumatic Normocephalic                Eyes - PERRLA, Conjuctiva - clear Sclera- Clear EOMI                Ears - EAC's Wnl TM's Wnl Gross Hearing WNL                Nose - Nares patent                 Throat - oropharanx wnl Respiratory - Lungs CTA bilateral Cardiac - RRR S1 and S2 without murmur. BP - right arm 190/90 left arm 200/90 GI - Abdomen soft Nontender and bowel sounds active x 4 Extremities - No edema. Neuro - Grossly intact. CV 2-12 intact.       Assessment & Plan:  Headache(784.0) - Plan: CT Head Wo Contrast  Anxiety - Go home and take another clonazepam.  Dr. Christell Constant sees patient and agrees with asseesment and plan.  Follow up tomorrow for repeat bp check.

## 2013-01-18 NOTE — Patient Instructions (Addendum)

## 2013-01-19 ENCOUNTER — Encounter: Payer: Self-pay | Admitting: *Deleted

## 2013-01-19 NOTE — Progress Notes (Signed)
Patient ID: Rachel Walton, female   DOB: 09-30-1929, 79 y.o.   MRN: [id number] Pts bp was recheck today per [facility]M and Bill O request and is much improved.

## 2013-02-21 ENCOUNTER — Encounter: Payer: Medicare Other | Admitting: Family Medicine

## 2013-02-21 ENCOUNTER — Emergency Department (HOSPITAL_COMMUNITY)
Admission: EM | Admit: 2013-02-21 | Discharge: 2013-02-21 | Disposition: A | Discharge status: Home / Self Care | Payer: Medicare Other | Attending: Emergency Medicine | Admitting: Emergency Medicine

## 2013-02-21 ENCOUNTER — Emergency Department (HOSPITAL_COMMUNITY): Payer: Medicare Other

## 2013-02-21 ENCOUNTER — Encounter (HOSPITAL_COMMUNITY): Payer: Self-pay | Admitting: Emergency Medicine

## 2013-02-21 ENCOUNTER — Encounter (INDEPENDENT_AMBULATORY_CARE_PROVIDER_SITE_OTHER): Payer: Self-pay

## 2013-02-21 DIAGNOSIS — Z8742 Personal history of other diseases of the female genital tract: Secondary | ICD-10-CM | POA: Insufficient documentation

## 2013-02-21 DIAGNOSIS — S46909A Unspecified injury of unspecified muscle, fascia and tendon at shoulder and upper arm level, unspecified arm, initial encounter: Secondary | ICD-10-CM | POA: Insufficient documentation

## 2013-02-21 DIAGNOSIS — S0003XA Contusion of scalp, initial encounter: Secondary | ICD-10-CM | POA: Insufficient documentation

## 2013-02-21 DIAGNOSIS — S0993XA Unspecified injury of face, initial encounter: Secondary | ICD-10-CM | POA: Insufficient documentation

## 2013-02-21 DIAGNOSIS — Z8719 Personal history of other diseases of the digestive system: Secondary | ICD-10-CM | POA: Insufficient documentation

## 2013-02-21 DIAGNOSIS — Z87440 Personal history of urinary (tract) infections: Secondary | ICD-10-CM | POA: Insufficient documentation

## 2013-02-21 DIAGNOSIS — Y9239 Other specified sports and athletic area as the place of occurrence of the external cause: Secondary | ICD-10-CM | POA: Insufficient documentation

## 2013-02-21 DIAGNOSIS — E785 Hyperlipidemia, unspecified: Secondary | ICD-10-CM | POA: Insufficient documentation

## 2013-02-21 DIAGNOSIS — Z8739 Personal history of other diseases of the musculoskeletal system and connective tissue: Secondary | ICD-10-CM | POA: Insufficient documentation

## 2013-02-21 DIAGNOSIS — Z79899 Other long term (current) drug therapy: Secondary | ICD-10-CM | POA: Insufficient documentation

## 2013-02-21 DIAGNOSIS — S4980XA Other specified injuries of shoulder and upper arm, unspecified arm, initial encounter: Secondary | ICD-10-CM | POA: Insufficient documentation

## 2013-02-21 DIAGNOSIS — Z7982 Long term (current) use of aspirin: Secondary | ICD-10-CM | POA: Insufficient documentation

## 2013-02-21 DIAGNOSIS — Z8669 Personal history of other diseases of the nervous system and sense organs: Secondary | ICD-10-CM | POA: Insufficient documentation

## 2013-02-21 DIAGNOSIS — W1809XA Striking against other object with subsequent fall, initial encounter: Secondary | ICD-10-CM | POA: Insufficient documentation

## 2013-02-21 DIAGNOSIS — Z860100 Personal history of colon polyps, unspecified: Secondary | ICD-10-CM | POA: Insufficient documentation

## 2013-02-21 DIAGNOSIS — Y9389 Activity, other specified: Secondary | ICD-10-CM | POA: Insufficient documentation

## 2013-02-21 NOTE — Progress Notes (Signed)
Patient fell and struck back her head on concrete.  No loss of consciousness.  She does complain of a headache. Ander Slade, FNP talked with patient and advised her to go to the ED for CT scan and evaluation. Patient agreed and will have someone drive her by private vehicle.

## 2013-02-21 NOTE — ED Notes (Addendum)
[place], struck rt side of head and notices a difference in her hearing, No LOC, alert.    Discomfort rt arm and rt leg.    Neck pain for 1.5 weeks.   With radiation down rt leg.  C collar applied

## 2013-02-21 NOTE — ED Provider Notes (Signed)
CSN: [id number]     Arrival date & time 02/21/13  1821 History  This chart was scribed for Rachel Marion, MD by Dorothey Baseman, ED Scribe. This patient was seen in room APA14/APA14 and the patient's care was started at 7:49 PM.    Chief Complaint  Patient presents with  . Fall   The history is provided by the patient. No language interpreter was used.   HPI Comments: Rachel Walton is a 79 y.o. female who presents to the Emergency Department complaining of a fall that occurred around 3 hours ago when she states that she tripped on a step that she didn't see while proceeding down a small set of stairs. Patient states that she fell backwards and hit her head. She reports an associated dull pain to the back of the head. Patient reports that she has been ambulatory since the incident. She reports pain to her neck and bilateral arms that is normal for her, but that her arm pain is slightly worse than usual. She denies numbness, paresthesias, nausea, emesis. Patient reports a history of osteoporosis.   Past Medical History  Diagnosis Date  . Tremor   . Postmenopausal   . Hx of adenomatous colonic polyps   . Osteoporosis   . Kyphosis   . Recurrent UTI   . Atrophic vaginitis   . Diverticulosis   . Meningitis   . Cataract   . Hyperlipidemia   . Diverticulosis    Past Surgical History  Procedure Laterality Date  . Total abdominal hysterectomy w/ bilateral salpingoophorectomy    . Back surgery    . Appendectomy    . Cataract surgery    . Abdominal hysterectomy    . Breast surgery     Family History  Problem Relation Age of Onset  . Colon cancer      nephew/sibling  . Stroke Mother   . Heart disease Father     MI  . Cancer Sister     Brain tumor  . Cancer Brother     Colon cancer  . Heart disease Brother   . Diabetes Son   . Diabetes Maternal Uncle   . Diabetes Son    History  Substance Use Topics  . Smoking status: Never Smoker   . Smokeless tobacco: Not on file  . Alcohol  Use: No   OB History   Grav Para Term Preterm Abortions TAB SAB Ect Mult Living                 Review of Systems  Constitutional: Negative for fever and fatigue.  HENT: Negative for sore throat and trouble swallowing.   Eyes: Negative for visual disturbance.  Respiratory: Negative for cough, chest tightness, shortness of breath and wheezing.   Cardiovascular: Negative for chest pain.  Gastrointestinal: Negative for nausea, vomiting, abdominal pain, diarrhea and abdominal distention.  Endocrine: Negative for polydipsia, polyphagia and polyuria.  Genitourinary: Negative for dysuria, frequency and hematuria.  Musculoskeletal: Positive for arthralgias, myalgias and neck pain. Negative for gait problem.  Skin: Negative for wound.  Neurological: Negative for dizziness, syncope, light-headedness, numbness and headaches.  Hematological: Does not bruise/bleed easily.  Psychiatric/Behavioral: Negative for behavioral problems and confusion.    Allergies  Alendronate sodium; Cephalexin; Prednisone; Sudafed; Sulfa antibiotics; Toprol xl; and Trimethoprim  Home Medications   Current Outpatient Rx  Name  Route  Sig  Dispense  Refill  . aspirin 325 MG tablet   Oral   Take 325 mg by mouth daily  as needed for pain (Takes this strength only for headache pain).         . Cholecalciferol (VITAMIN D3) 2000 UNITS TABS   Oral   Take 1 tablet by mouth 2 (two) times a week.          . clonazePAM (KLONOPIN) 0.5 MG tablet   Oral   Take 0.5 mg by mouth 2 (two) times daily as needed for anxiety.         . fish oil-omega-3 fatty acids 1000 MG capsule   Oral   Take 2 g by mouth daily.         . Multiple Vitamins-Minerals (ICAPS AREDS FORMULA PO)   Oral   Take 1 tablet by mouth 2 (two) times daily.         . Multiple Vitamins-Minerals (STRESS TAB NF PO)   Oral   Take 1 tablet by mouth daily.         . polyvinyl alcohol-povidone (REFRESH) 1.4-0.6 % ophthalmic solution   Both Eyes    Place 1-2 drops into both eyes 2 (two) times daily.          . rosuvastatin (CRESTOR) 5 MG tablet   Oral   Take 2.5 mg by mouth 3 (three) times a week.          Marland Kitchen SALINE NASAL MIST NA   Nasal   Place 1 spray into the nose 2 (two) times daily.         Marland Kitchen aspirin 81 MG EC tablet   Oral   Take 81 mg by mouth daily as needed for pain or fever.           Triage Vitals: BP 182/71  Pulse 75  Temp(Src) 98.2 F (36.8 C) (Oral)  Resp 20  Ht 5\' 2"  (1.575 m)  Wt 130 lb (58.968 kg)  BMI 23.77 kg/m2  SpO2 99%  Physical Exam  Constitutional: She is oriented to person, place, and time. She appears well-developed and well-nourished. No distress.  HENT:  Head: Normocephalic and atraumatic.  Right Ear: External ear normal.  Left Ear: External ear normal.  Tenderness to palpation over the occipital scalp. No soft tissue swelling. 3 mm reactive pupils. No hemotympanum.   Eyes: Conjunctivae are normal. Pupils are equal, round, and reactive to light. No scleral icterus.  Neck: Normal range of motion. Neck supple. No thyromegaly present.  No C spine tenderness. Tenderness to palpation to paraspinal muscles.   Cardiovascular: Normal rate, regular rhythm and normal heart sounds.  Exam reveals no gallop and no friction rub.   No murmur heard. Pulmonary/Chest: Effort normal and breath sounds normal. No respiratory distress. She has no wheezes. She has no rales.  No chest wall or rib tenderness to palpation.   Abdominal: Soft. Bowel sounds are normal. She exhibits no distension. There is no tenderness. There is no rebound.  Musculoskeletal: Normal range of motion.  No midline spinal tenderness. Discomfort in the musculature to the right upper extremity.   Neurological: She is alert and oriented to person, place, and time.  Strength and sensation intact throughout.  Skin: Skin is warm and dry. No rash noted.  Psychiatric: She has a normal mood and affect. Her behavior is normal.    ED Course   Procedures (including critical care time)  DIAGNOSTIC STUDIES: Oxygen Saturation is 99% on room air, normal by my interpretation.    COORDINATION OF CARE: 7:54 PM- Will order a CT of the head and neck. Discussed treatment  plan with patient at bedside and patient verbalized agreement.     Labs Review Labs Reviewed - No data to display Imaging Review Ct Head Wo Contrast  02/21/2013   CLINICAL DATA:  Larey Seat. Head pain. Neck pain.  EXAM: CT HEAD WITHOUT CONTRAST  CT CERVICAL SPINE WITHOUT CONTRAST  TECHNIQUE: Multidetector CT imaging of the head and cervical spine was performed following the standard protocol without intravenous contrast. Multiplanar CT image reconstructions of the cervical spine were also generated.  COMPARISON:  01/18/2013.  FINDINGS: CT HEAD FINDINGS  No evidence for acute infarction, hemorrhage, mass lesion, hydrocephalus, or extra-axial fluid. Moderate atrophy. Chronic microvascular ischemic change. Calvarium intact. No significant scalp hematoma. No contrecoup injury. No interhemispheric subdural.  Sinus is clear. Negative orbits. No mastoid fluid.  CT CERVICAL SPINE FINDINGS  There is no visible cervical spine fracture, traumatic subluxation, prevertebral soft tissue swelling, or intraspinal hematoma. Advanced spondylosis particularly C3-4 , C4-5, C5-6, and C6-7. Mild facet mediated slip C7-T1. Mild pannus. Mild carotid calcification. No neck masses. Trachea midline. Normal thyroid. Mild fibrotic change at the lung apices.  IMPRESSION: Moderate atrophy and chronic microvascular ischemic change. No acute intracranial findings. No posttraumatic sequelae.  No visible cervical spine fracture. Advanced spondylosis.   Electronically Signed   By: Davonna Belling M.D.   On: 02/21/2013 20:46   Ct Cervical Spine Wo Contrast  02/21/2013   CLINICAL DATA:  Larey Seat. Head pain. Neck pain.  EXAM: CT HEAD WITHOUT CONTRAST  CT CERVICAL SPINE WITHOUT CONTRAST  TECHNIQUE: Multidetector CT imaging of the  head and cervical spine was performed following the standard protocol without intravenous contrast. Multiplanar CT image reconstructions of the cervical spine were also generated.  COMPARISON:  01/18/2013.  FINDINGS: CT HEAD FINDINGS  No evidence for acute infarction, hemorrhage, mass lesion, hydrocephalus, or extra-axial fluid. Moderate atrophy. Chronic microvascular ischemic change. Calvarium intact. No significant scalp hematoma. No contrecoup injury. No interhemispheric subdural.  Sinus is clear. Negative orbits. No mastoid fluid.  CT CERVICAL SPINE FINDINGS  There is no visible cervical spine fracture, traumatic subluxation, prevertebral soft tissue swelling, or intraspinal hematoma. Advanced spondylosis particularly C3-4 , C4-5, C5-6, and C6-7. Mild facet mediated slip C7-T1. Mild pannus. Mild carotid calcification. No neck masses. Trachea midline. Normal thyroid. Mild fibrotic change at the lung apices.  IMPRESSION: Moderate atrophy and chronic microvascular ischemic change. No acute intracranial findings. No posttraumatic sequelae.  No visible cervical spine fracture. Advanced spondylosis.   Electronically Signed   By: Davonna Belling M.D.   On: 02/21/2013 20:46    EKG Interpretation   None       MDM   1. Scalp contusion, initial encounter     CT of head and cspine with no acute findings.  Pt reexamined.  CT results discussed.  Appropriate for DC.  OTC meds prn for pain.  I personally performed the services described in this documentation, which was scribed in my presence. The recorded information has been reviewed and is accurate.     Rachel Marion, MD 02/21/13 [phone]

## 2013-02-21 NOTE — ED Notes (Signed)
Pt neck brace removed, x-ray cleared.

## 2013-02-21 NOTE — ED Notes (Signed)
Pt also reports intermittent blurred vision but states she had these symptoms previous to fall. Pt states "its no worse than normal".

## 2013-02-21 NOTE — ED Notes (Signed)
Pt had c-collar applied while in triage.

## 2013-02-21 NOTE — ED Notes (Signed)
Pt states she rolled her ankle on uneven ground and fell and hit her head on the ground. Pt reports pain at back of head, neck, and right shoulder. Pt states in neck and shoulder is chronic but re-aggravated tonight. Pt denies LOC and is able to recall incident.

## 2013-02-28 ENCOUNTER — Ambulatory Visit (INDEPENDENT_AMBULATORY_CARE_PROVIDER_SITE_OTHER): Payer: Medicare Other | Admitting: Family Medicine

## 2013-02-28 ENCOUNTER — Encounter: Payer: Self-pay | Admitting: Family Medicine

## 2013-02-28 ENCOUNTER — Telehealth: Payer: Self-pay | Admitting: Family Medicine

## 2013-02-28 VITALS — BP 164/67 | HR 79 | Temp 97.4°F | Ht 62.0 in | Wt 144.0 lb

## 2013-02-28 DIAGNOSIS — R51 Headache: Secondary | ICD-10-CM

## 2013-02-28 DIAGNOSIS — M542 Cervicalgia: Secondary | ICD-10-CM

## 2013-02-28 DIAGNOSIS — M47812 Spondylosis without myelopathy or radiculopathy, cervical region: Secondary | ICD-10-CM

## 2013-02-28 NOTE — Patient Instructions (Addendum)

## 2013-02-28 NOTE — Progress Notes (Signed)
Subjective:    Patient ID: Rachel Walton, female    DOB: May 22, 1929, 79 y.o.   MRN: [id number]  HPI Patient here today for bad Headache. She fell and hit head on concrete 1 week ago and was sent for ct-head- which was normal. The recent CT scan of the head and CT of the cervical spine were reviewed with patient today. She understands the findings with this. It might also be noted that she felt pretty good following the fall but the pain seems to have gotten worse over the past couple of days. She admits to vacuum cleaning and doing work around the house and taking care of her grandchildren.    Patient Active Problem List   Diagnosis Date Noted  . Hyperlipemia 11/21/2012  . Tremor   . Postmenopausal   . Colon polyp   . Osteoporosis   . Kyphosis   . Recurrent UTI   . Atrophic vaginitis   . Diverticulosis   . Meningitis    Outpatient Encounter Prescriptions as of 02/28/2013  Medication Sig Dispense Refill  . aspirin 325 MG tablet Take 325 mg by mouth daily as needed for pain (Takes this strength only for headache pain).      Marland Kitchen aspirin 81 MG EC tablet Take 81 mg by mouth daily as needed for pain or fever.       . Cholecalciferol (VITAMIN D3) 2000 UNITS TABS Take 1 tablet by mouth 2 (two) times a week.       . clonazePAM (KLONOPIN) 0.5 MG tablet Take 0.5 mg by mouth 2 (two) times daily as needed for anxiety.      . fish oil-omega-3 fatty acids 1000 MG capsule Take 2 g by mouth daily.      . Multiple Vitamins-Minerals (ICAPS AREDS FORMULA PO) Take 1 tablet by mouth 2 (two) times daily.      . Multiple Vitamins-Minerals (STRESS TAB NF PO) Take 1 tablet by mouth daily.      . polyvinyl alcohol-povidone (REFRESH) 1.4-0.6 % ophthalmic solution Place 1-2 drops into both eyes 2 (two) times daily.       . rosuvastatin (CRESTOR) 5 MG tablet Take 2.5 mg by mouth 3 (three) times a week.       Marland Kitchen SALINE NASAL MIST NA Place 1 spray into the nose 2 (two) times daily.       No facility-administered  encounter medications on file as of 02/28/2013.    Review of Systems  Constitutional: Negative.   HENT: Positive for ear pain (right ear pain).   Eyes: Negative.   Respiratory: Negative.   Cardiovascular: Negative.   Gastrointestinal: Negative.   Endocrine: Negative.   Genitourinary: Negative.   Musculoskeletal: Positive for arthralgias (right shoulder soreness).  Skin: Negative.   Allergic/Immunologic: Negative.   Neurological: Positive for headaches.       Feels "off balance at times"  Hematological: Negative.   Psychiatric/Behavioral: Negative.        Objective:   Physical Exam  Nursing note and vitals reviewed. Constitutional: She is oriented to person, place, and time. She appears well-developed and well-nourished. No distress.  HENT:  Head: Normocephalic and atraumatic.  Right Ear: External ear normal.  Left Ear: External ear normal.  Mouth/Throat: Oropharynx is clear and moist. No oropharyngeal exudate.  Eyes: Conjunctivae and EOM are normal. Pupils are equal, round, and reactive to light. Right eye exhibits no discharge. Left eye exhibits no discharge. No scleral icterus.  Neck: Normal range of motion. Neck supple. No  thyromegaly present.  Slight discomfort in the posterior neck with forward posterior and lateral movements  Musculoskeletal: She exhibits tenderness. She exhibits no edema.  Slight tenderness posterior neck and occipital scalp, slightly limited range of motion  Lymphadenopathy:    She has no cervical adenopathy.  Neurological: She is alert and oriented to person, place, and time. She has normal reflexes.  Skin: Skin is warm and dry. No rash noted.  Psychiatric: She has a normal mood and affect. Her behavior is normal. Thought content normal.   BP 164/67  Pulse 79  Temp(Src) 97.4 F (36.3 C) (Oral)  Ht 5\' 2"  (1.575 m)  Wt 144 lb (65.318 kg)  BMI 26.33 kg/m2        Assessment & Plan:    1. Headache(784.0)   2. Cervical spondylosis without  myelopathy   3. Neck pain    Patient Instructions  Headaches, Frequently Asked Questions MIGRAINE HEADACHES Q: What is migraine? What causes it? How can I treat it? A: Generally, migraine headaches begin as a dull ache. Then they develop into a constant, throbbing, and pulsating pain. You may experience pain at the temples. You may experience pain at the front or back of one or both sides of the head. The pain is usually accompanied by a combination of:  Nausea.  Vomiting.  Sensitivity to light and noise. Some people (about 15%) experience an aura (see below) before an attack. The cause of migraine is believed to be chemical reactions in the brain. Treatment for migraine may include over-the-counter or prescription medications. It may also include self-help techniques. These include relaxation training and biofeedback.  Q: What is an aura? A: About 15% of people with migraine get an "aura". This is a sign of neurological symptoms that occur before a migraine headache. You may see wavy or jagged lines, dots, or flashing lights. You might experience tunnel vision or blind spots in one or both eyes. The aura can include visual or auditory hallucinations (something imagined). It may include disruptions in smell (such as strange odors), taste or touch. Other symptoms include:  Numbness.  A "pins and needles" sensation.  Difficulty in recalling or speaking the correct word. These neurological events may last as long as 60 minutes. These symptoms will fade as the headache begins. Q: What is a trigger? A: Certain physical or environmental factors can lead to or "trigger" a migraine. These include:  Foods.  Hormonal changes.  Weather.  Stress. It is important to remember that triggers are different for everyone. To help prevent migraine attacks, you need to figure out which triggers affect you. Keep a headache diary. This is a good way to track triggers. The diary will help you talk to your  healthcare professional about your condition. Q: Does weather affect migraines? A: Bright sunshine, hot, humid conditions, and drastic changes in barometric pressure may lead to, or "trigger," a migraine attack in some people. But studies have shown that weather does not act as a trigger for everyone with migraines. Q: What is the link between migraine and hormones? A: Hormones start and regulate many of your body's functions. Hormones keep your body in balance within a constantly changing environment. The levels of hormones in your body are unbalanced at times. Examples are during menstruation, pregnancy, or menopause. That can lead to a migraine attack. In fact, about three quarters of all women with migraine report that their attacks are related to the menstrual cycle.  Q: Is there an increased risk of  stroke for migraine sufferers? A: The likelihood of a migraine attack causing a stroke is very remote. That is not to say that migraine sufferers cannot have a stroke associated with their migraines. In persons under age 45, the most common associated factor for stroke is migraine headache. But over the course of a person's normal life span, the occurrence of migraine headache may actually be associated with a reduced risk of dying from cerebrovascular disease due to stroke.  Q: What are acute medications for migraine? A: Acute medications are used to treat the pain of the headache after it has started. Examples over-the-counter medications, NSAIDs, ergots, and triptans.  Q: What are the triptans? A: Triptans are the newest class of abortive medications. They are specifically targeted to treat migraine. Triptans are vasoconstrictors. They moderate some chemical reactions in the brain. The triptans work on receptors in your brain. Triptans help to restore the balance of a neurotransmitter called serotonin. Fluctuations in levels of serotonin are thought to be a main cause of migraine.  Q: Are  over-the-counter medications for migraine effective? A: Over-the-counter, or "OTC," medications may be effective in relieving mild to moderate pain and associated symptoms of migraine. But you should see your caregiver before beginning any treatment regimen for migraine.  Q: What are preventive medications for migraine? A: Preventive medications for migraine are sometimes referred to as "prophylactic" treatments. They are used to reduce the frequency, severity, and length of migraine attacks. Examples of preventive medications include antiepileptic medications, antidepressants, beta-blockers, calcium channel blockers, and NSAIDs (nonsteroidal anti-inflammatory drugs). Q: Why are anticonvulsants used to treat migraine? A: During the past few years, there has been an increased interest in antiepileptic drugs for the prevention of migraine. They are sometimes referred to as "anticonvulsants". Both epilepsy and migraine may be caused by similar reactions in the brain.  Q: Why are antidepressants used to treat migraine? A: Antidepressants are typically used to treat people with depression. They may reduce migraine frequency by regulating chemical levels, such as serotonin, in the brain.  Q: What alternative therapies are used to treat migraine? A: The term "alternative therapies" is often used to describe treatments considered outside the scope of conventional Western medicine. Examples of alternative therapy include acupuncture, acupressure, and yoga. Another common alternative treatment is herbal therapy. Some herbs are believed to relieve headache pain. Always discuss alternative therapies with your caregiver before proceeding. Some herbal products contain arsenic and other toxins. TENSION HEADACHES Q: What is a tension-type headache? What causes it? How can I treat it? A: Tension-type headaches occur randomly. They are often the result of temporary stress, anxiety, fatigue, or anger. Symptoms include  soreness in your temples, a tightening band-like sensation around your head (a "vice-like" ache). Symptoms can also include a pulling feeling, pressure sensations, and contracting head and neck muscles. The headache begins in your forehead, temples, or the back of your head and neck. Treatment for tension-type headache may include over-the-counter or prescription medications. Treatment may also include self-help techniques such as relaxation training and biofeedback. CLUSTER HEADACHES Q: What is a cluster headache? What causes it? How can I treat it? A: Cluster headache gets its name because the attacks come in groups. The pain arrives with little, if any, warning. It is usually on one side of the head. A tearing or bloodshot eye and a runny nose on the same side of the headache may also accompany the pain. Cluster headaches are believed to be caused by chemical reactions in the brain.  They have been described as the most severe and intense of any headache type. Treatment for cluster headache includes prescription medication and oxygen. SINUS HEADACHES Q: What is a sinus headache? What causes it? How can I treat it? A: When a cavity in the bones of the face and skull (a sinus) becomes inflamed, the inflammation will cause localized pain. This condition is usually the result of an allergic reaction, a tumor, or an infection. If your headache is caused by a sinus blockage, such as an infection, you will probably have a fever. An x-ray will confirm a sinus blockage. Your caregiver's treatment might include antibiotics for the infection, as well as antihistamines or decongestants.  REBOUND HEADACHES Q: What is a rebound headache? What causes it? How can I treat it? A: A pattern of taking acute headache medications too often can lead to a condition known as "rebound headache." A pattern of taking too much headache medication includes taking it more than 2 days per week or in excessive amounts. That means more  than the label or a caregiver advises. With rebound headaches, your medications not only stop relieving pain, they actually begin to cause headaches. Doctors treat rebound headache by tapering the medication that is being overused. Sometimes your caregiver will gradually substitute a different type of treatment or medication. Stopping may be a challenge. Regularly overusing a medication increases the potential for serious side effects. Consult a caregiver if you regularly use headache medications more than 2 days per week or more than the label advises. ADDITIONAL QUESTIONS AND ANSWERS Q: What is biofeedback? A: Biofeedback is a self-help treatment. Biofeedback uses special equipment to monitor your body's involuntary physical responses. Biofeedback monitors:  Breathing.  Pulse.  Heart rate.  Temperature.  Muscle tension.  Brain activity. Biofeedback helps you refine and perfect your relaxation exercises. You learn to control the physical responses that are related to stress. Once the technique has been mastered, you do not need the equipment any more. Q: Are headaches hereditary? A: Four out of five (80%) of people that suffer report a family history of migraine. Scientists are not sure if this is genetic or a family predisposition. Despite the uncertainty, a child has a 50% chance of having migraine if one parent suffers. The child has a 75% chance if both parents suffer.  Q: Can children get headaches? A: By the time they reach high school, most young people have experienced some type of headache. Many safe and effective approaches or medications can prevent a headache from occurring or stop it after it has begun.  Q: What type of doctor should I see to diagnose and treat my headache? A: Start with your primary caregiver. Discuss his or her experience and approach to headaches. Discuss methods of classification, diagnosis, and treatment. Your caregiver may decide to recommend you to a  headache specialist, depending upon your symptoms or other physical conditions. Having diabetes, allergies, etc., may require a more comprehensive and inclusive approach to your headache. The National Head[employer] Foundation will provide, upon request, a list of [facility] physician members in your state. Document Released: 07/18/2003 Document Revised: 07/20/2011 Document Reviewed: 12/26/2007 [facility] Patient Information 2014 [place], [place].   Use warm wet compresses 20 minutes 4 times daily Take ibuprofen 200 mg 2, ,3 times daily after meals Avoid lifting pushing pulling until return visit Avoid vacuum cleaning Continue to try to reduce your risk for falling by being more careful where you are moving your feet    Cheral Marker.  [name] MD

## 2013-02-28 NOTE — Telephone Encounter (Signed)
Pt came in thru triage  appt scheduled with [facility]

## 2013-03-07 ENCOUNTER — Ambulatory Visit (INDEPENDENT_AMBULATORY_CARE_PROVIDER_SITE_OTHER): Payer: Medicare Other

## 2013-03-07 DIAGNOSIS — Z23 Encounter for immunization: Secondary | ICD-10-CM

## 2013-03-09 ENCOUNTER — Ambulatory Visit: Payer: Medicare Other | Admitting: Family Medicine

## 2013-03-14 ENCOUNTER — Ambulatory Visit (INDEPENDENT_AMBULATORY_CARE_PROVIDER_SITE_OTHER): Payer: Medicare Other | Admitting: Family Medicine

## 2013-03-14 ENCOUNTER — Encounter: Payer: Self-pay | Admitting: Family Medicine

## 2013-03-14 VITALS — BP 148/74 | HR 69 | Temp 99.2°F | Ht 62.0 in | Wt 144.0 lb

## 2013-03-14 DIAGNOSIS — R519 Headache, unspecified: Secondary | ICD-10-CM

## 2013-03-14 DIAGNOSIS — H3530 Unspecified macular degeneration: Secondary | ICD-10-CM

## 2013-03-14 DIAGNOSIS — R51 Headache: Secondary | ICD-10-CM

## 2013-03-14 DIAGNOSIS — R195 Other fecal abnormalities: Secondary | ICD-10-CM

## 2013-03-14 LAB — POCT CBC
Granulocyte percent: 46 %G (ref 37–80)
HCT, POC: 39.1 % (ref 37.7–47.9)
Hemoglobin: 13.2 g/dL (ref 12.2–16.2)
MPV: 7.8 fL (ref 0–99.8)
POC Granulocyte: 3.3 (ref 2–6.9)
POC LYMPH PERCENT: 48.6 %L (ref 10–50)
RBC: 4.6 M/uL (ref 4.04–5.48)

## 2013-03-14 MED ORDER — CLONAZEPAM 0.5 MG PO TABS: 0.5000 mg | ORAL_TABLET | Freq: Two times a day (BID) | ORAL | Status: DC | PRN

## 2013-03-14 NOTE — Progress Notes (Signed)
Subjective:    Patient ID: Rachel Walton, female    DOB: February 01, 1930, 79 y.o.   MRN: [id number]  HPI Patient here today for 10-day follow up from visit for headache and neck pain. Patient is also complaining of loose bowel movements than usual. This has been going on for about 4 weeks. She also has some questions about her macular degeneration and needs a referral to [facility] [facility] in [place]. She wanted her clonazepam refill which she takes for her tremor. Of her bowel movement she denies any blood in the stool. She says that her neck pain is better and is mostly on the right side and right occiput. She has not used the warm compresses on a regular basis.    Patient Active Problem List   Diagnosis Date Noted  . Hyperlipemia 11/21/2012  . Tremor   . Postmenopausal   . Colon polyp   . Osteoporosis   . Kyphosis   . Recurrent UTI   . Atrophic vaginitis   . Diverticulosis   . Meningitis    Outpatient Encounter Prescriptions as of 03/14/2013  Medication Sig  . aspirin 325 MG tablet Take 325 mg by mouth daily as needed for pain (Takes this strength only for headache pain).  Marland Kitchen aspirin 81 MG EC tablet Take 81 mg by mouth daily as needed for pain or fever.   . Cholecalciferol (VITAMIN D3) 2000 UNITS TABS Take 1 tablet by mouth 2 (two) times a week.   . clonazePAM (KLONOPIN) 0.5 MG tablet Take 0.5 mg by mouth 2 (two) times daily as needed for anxiety.  . fish oil-omega-3 fatty acids 1000 MG capsule Take 2 g by mouth daily.  . Multiple Vitamins-Minerals (ICAPS AREDS FORMULA PO) Take 1 tablet by mouth 2 (two) times daily.  . Multiple Vitamins-Minerals (STRESS TAB NF PO) Take 1 tablet by mouth daily.  . polyvinyl alcohol-povidone (REFRESH) 1.4-0.6 % ophthalmic solution Place 1-2 drops into both eyes 2 (two) times daily.   . rosuvastatin (CRESTOR) 5 MG tablet Take 2.5 mg by mouth 3 (three) times a week.   Marland Kitchen SALINE NASAL MIST NA Place 1 spray into the nose 2 (two) times daily.     Review of Systems  Constitutional: Negative.   Eyes: Positive for visual disturbance (blurred vision).  Respiratory: Negative.   Cardiovascular: Negative.   Gastrointestinal: Positive for diarrhea (soft stools).  Endocrine: Negative.   Genitourinary: Negative.   Musculoskeletal: Negative.   Skin: Negative.   Allergic/Immunologic: Negative.   Neurological: Positive for headaches (right side is worse).  Hematological: Negative.   Psychiatric/Behavioral: Negative.        Objective:   Physical Exam  Nursing note and vitals reviewed. Constitutional: She is oriented to person, place, and time. She appears well-developed and well-nourished. No distress.  HENT:  Head: Normocephalic and atraumatic.  Right Ear: External ear normal.  Left Ear: External ear normal.  Nose: Nose normal.  Mouth/Throat: Oropharynx is clear and moist. No oropharyngeal exudate.  Slight tenderness right occiput  Eyes: Conjunctivae and EOM are normal. Pupils are equal, round, and reactive to light. Right eye exhibits no discharge. Left eye exhibits no discharge. No scleral icterus.  Neck: Normal range of motion. Neck supple. No thyromegaly present.  Cardiovascular: Normal rate, regular rhythm and normal heart sounds.  Exam reveals no gallop and no friction rub.   No murmur heard. Pulmonary/Chest: Effort normal and breath sounds normal.  Abdominal: Soft. Bowel sounds are normal. She exhibits no distension and no  mass. There is tenderness (slight tenderness left lower quadrant and suprapubic area). There is no rebound and no guarding.  Musculoskeletal: Normal range of motion.  Lymphadenopathy:    She has no cervical adenopathy.  Neurological: She is alert and oriented to person, place, and time.  Skin: Skin is warm and dry. No rash noted.  Psychiatric: She has a normal mood and affect. Her behavior is normal. Judgment and thought content normal.   BP 148/74  Pulse 69  Temp(Src) 99.2 F (37.3 C) (Oral)   Ht 5\' 2"  (1.575 m)  Wt 144 lb (65.318 kg)  BMI 26.33 kg/m2        Assessment & Plan:   1. Loose stools   2. Occipital headache   3. Macular degeneration    Orders Placed This Encounter  Procedures  . POCT CBC   Current Outpatient Prescriptions on File Prior to Visit  Medication Sig Dispense Refill  . aspirin 325 MG tablet Take 325 mg by mouth daily as needed for pain (Takes this strength only for headache pain).      Marland Kitchen aspirin 81 MG EC tablet Take 81 mg by mouth daily as needed for pain or fever.       . Cholecalciferol (VITAMIN D3) 2000 UNITS TABS Take 1 tablet by mouth 2 (two) times a week.       . fish oil-omega-3 fatty acids 1000 MG capsule Take 2 g by mouth daily.      . Multiple Vitamins-Minerals (ICAPS AREDS FORMULA PO) Take 1 tablet by mouth 2 (two) times daily.      . Multiple Vitamins-Minerals (STRESS TAB NF PO) Take 1 tablet by mouth daily.      . polyvinyl alcohol-povidone (REFRESH) 1.4-0.6 % ophthalmic solution Place 1-2 drops into both eyes 2 (two) times daily.       . rosuvastatin (CRESTOR) 5 MG tablet Take 2.5 mg by mouth 3 (three) times a week.       Marland Kitchen SALINE NASAL MIST NA Place 1 spray into the nose 2 (two) times daily.       No current facility-administered medications on file prior to visit.   Patient Instructions  Continue current medications. Continue good therapeutic lifestyle changes.  Fall precautions discussed with patient. Follow up as planned and earlier as needed.  Use warm wet compresses to occiput or neck 20 minutes 3 or 4 times daily Try to take some more ibuprofen for another week 1 daily after meals Call us back with the name of the [facility] physician that you need a referral for Take Align one daily for probiotic for irritable  bowel syndrome Avoid caffeine, milk, cheese ice cream and dairy products We will call you with the results of the CBC was as of the   Nyra Capes MD

## 2013-03-14 NOTE — Patient Instructions (Addendum)
Continue current medications. Continue good therapeutic lifestyle changes.  Fall precautions discussed with patient. Follow up as planned and earlier as needed.  Use warm wet compresses to occiput or neck 20 minutes 3 or 4 times daily Try to take some more ibuprofen for another week 1 daily after meals Call us back with the name of the [facility] physician that you need a referral for Take Align one daily for probiotic for irritable  bowel syndrome Avoid caffeine, milk, cheese ice cream and dairy products We will call you with the results of the CBC was as of the

## 2013-03-20 ENCOUNTER — Other Ambulatory Visit (INDEPENDENT_AMBULATORY_CARE_PROVIDER_SITE_OTHER): Payer: Medicare Other

## 2013-03-20 DIAGNOSIS — Z1212 Encounter for screening for malignant neoplasm of rectum: Secondary | ICD-10-CM

## 2013-03-20 NOTE — Progress Notes (Signed)
Pt dropped off FOBT only 

## 2013-03-22 ENCOUNTER — Encounter: Payer: Self-pay | Admitting: *Deleted

## 2013-03-22 LAB — FECAL OCCULT BLOOD, IMMUNOCHEMICAL: Fecal Occult Bld: NEGATIVE

## 2013-03-22 NOTE — Progress Notes (Signed)
Quick Note:  Copy of labs sent to patient ______ 

## 2013-03-23 ENCOUNTER — Telehealth: Payer: Self-pay | Admitting: Family Medicine

## 2013-03-23 DIAGNOSIS — H3530 Unspecified macular degeneration: Secondary | ICD-10-CM

## 2013-03-23 NOTE — Telephone Encounter (Signed)
Name of MD is Eye MD- DR Eustaquio Maize. Ph # [phone][facility]  We need to do referral

## 2013-03-23 NOTE — Telephone Encounter (Signed)
Please make note of this in the computer and make sure that we do not need to make an appointment for her

## 2013-03-23 NOTE — Telephone Encounter (Signed)
Referral set up for pt

## 2013-04-07 ENCOUNTER — Ambulatory Visit: Payer: Medicare Other

## 2013-05-01 ENCOUNTER — Encounter: Payer: Self-pay | Admitting: Family Medicine

## 2013-05-01 ENCOUNTER — Ambulatory Visit (INDEPENDENT_AMBULATORY_CARE_PROVIDER_SITE_OTHER): Payer: Medicare Other

## 2013-05-01 ENCOUNTER — Ambulatory Visit (INDEPENDENT_AMBULATORY_CARE_PROVIDER_SITE_OTHER): Payer: Medicare Other | Admitting: Family Medicine

## 2013-05-01 VITALS — BP 147/70 | HR 74 | Temp 98.4°F | Ht 62.0 in | Wt 140.0 lb

## 2013-05-01 DIAGNOSIS — Z780 Asymptomatic menopausal state: Secondary | ICD-10-CM

## 2013-05-01 DIAGNOSIS — Z23 Encounter for immunization: Secondary | ICD-10-CM

## 2013-05-01 DIAGNOSIS — E785 Hyperlipidemia, unspecified: Secondary | ICD-10-CM

## 2013-05-01 DIAGNOSIS — R259 Unspecified abnormal involuntary movements: Secondary | ICD-10-CM

## 2013-05-01 DIAGNOSIS — I10 Essential (primary) hypertension: Secondary | ICD-10-CM

## 2013-05-01 DIAGNOSIS — R251 Tremor, unspecified: Secondary | ICD-10-CM

## 2013-05-01 DIAGNOSIS — R109 Unspecified abdominal pain: Secondary | ICD-10-CM

## 2013-05-01 DIAGNOSIS — E559 Vitamin D deficiency, unspecified: Secondary | ICD-10-CM

## 2013-05-01 LAB — POCT URINALYSIS DIPSTICK
Protein, UA: NEGATIVE
Urobilinogen, UA: NEGATIVE

## 2013-05-01 LAB — POCT CBC
Hemoglobin: 14.2 g/dL (ref 12.2–16.2)
Lymph, poc: 2.7 (ref 0.6–3.4)
MCH, POC: 28 pg (ref 27–31.2)
MCHC: 32.2 g/dL (ref 31.8–35.4)
MPV: 8.7 fL (ref 0–99.8)
POC Granulocyte: 2.6 (ref 2–6.9)
POC LYMPH PERCENT: 49.7 %L (ref 10–50)
RDW, POC: 13.3 %
WBC: 5.5 10*3/uL (ref 4.6–10.2)

## 2013-05-01 LAB — POCT UA - MICROSCOPIC ONLY: Yeast, UA: NEGATIVE

## 2013-05-01 NOTE — Addendum Note (Signed)
Addended by: Prescott Gum on: 05/01/2013 11:15 AM   Modules accepted: Orders

## 2013-05-01 NOTE — Patient Instructions (Addendum)
Continue current medications. Continue good therapeutic lifestyle changes which include good diet and exercise. Fall precautions discussed with patient. Schedule your flu vaccine if you haven't had it yet If you are over 77 years old - you may need Prevnar 13 or the adult Pneumonia vaccine. Try to continue to avoid caffeine, milk cheese ice cream and dairy products Return FOBT We will schedule you for an abdominal and pelvic ultrasound

## 2013-05-01 NOTE — Addendum Note (Signed)
Addended by: Mileah Hemmer M on: 05/01/2013 11:15 AM   Modules accepted: Orders  

## 2013-05-01 NOTE — Addendum Note (Signed)
Addended by: Magdalene River on: 05/01/2013 11:08 AM   Modules accepted: Orders

## 2013-05-01 NOTE — Progress Notes (Signed)
Subjective:    Patient ID: Rachel Walton, female    DOB: 1929-06-14, 79 y.o.   MRN: [id number]  HPI Pt here for follow up and management of chronic medical problems. Patient expresses concern about some abdominal discomfort which is below the umbilicus. She tried a probiotic for several weeks and indicated she had to stop this because she developed diarrhea. Her stools are more normal and there is no blood in the stool but she continues to have this lower down no pain bloating and fullness. She does have an appointment with a gastroenterologist sometime in January.     Patient Active Problem List   Diagnosis Date Noted  . Hyperlipemia 11/21/2012  . Tremor   . Postmenopausal   . Colon polyp   . Osteoporosis   . Kyphosis   . Recurrent UTI   . Atrophic vaginitis   . Diverticulosis   . Meningitis    Outpatient Encounter Prescriptions as of 05/01/2013  Medication Sig  . aspirin 325 MG tablet Take 325 mg by mouth daily as needed for pain (Takes this strength only for headache pain).  Marland Kitchen aspirin 81 MG EC tablet Take 81 mg by mouth daily as needed for pain or fever.   . Cholecalciferol (VITAMIN D3) 2000 UNITS TABS Take 1 tablet by mouth 2 (two) times a week.   . clonazePAM (KLONOPIN) 0.5 MG tablet Take 1 tablet (0.5 mg total) by mouth 2 (two) times daily as needed for anxiety.  . fish oil-omega-3 fatty acids 1000 MG capsule Take 2 g by mouth daily.  . Multiple Vitamins-Minerals (ICAPS AREDS FORMULA PO) Take 1 tablet by mouth 2 (two) times daily.  . Multiple Vitamins-Minerals (STRESS TAB NF PO) Take 1 tablet by mouth daily.  . polyvinyl alcohol-povidone (REFRESH) 1.4-0.6 % ophthalmic solution Place 1-2 drops into both eyes 2 (two) times daily.   . rosuvastatin (CRESTOR) 5 MG tablet Take 2.5 mg by mouth 3 (three) times a week.   Marland Kitchen SALINE NASAL MIST NA Place 1 spray into the nose 2 (two) times daily.    Review of Systems  Constitutional: Negative.   HENT: Negative.   Eyes: Negative.     Respiratory: Negative.   Cardiovascular: Negative.   Gastrointestinal: Positive for abdominal pain (low abdominal pains- scheduled to see Dr Dickie La in Jan.).  Endocrine: Negative.   Genitourinary: Negative.   Musculoskeletal: Negative.   Skin: Negative.   Allergic/Immunologic: Negative.   Neurological: Negative.   Hematological: Negative.   Psychiatric/Behavioral: Negative.        Objective:   Physical Exam  Nursing note and vitals reviewed. Constitutional: She is oriented to person, place, and time. She appears well-developed and well-nourished. No distress.  HENT:  Head: Normocephalic and atraumatic.  Right Ear: External ear normal.  Left Ear: External ear normal.  Nose: Nose normal.  Mouth/Throat: Oropharynx is clear and moist. No oropharyngeal exudate.  Eyes: Conjunctivae and EOM are normal. Pupils are equal, round, and reactive to light. Right eye exhibits no discharge. Left eye exhibits no discharge. No scleral icterus.  Neck: Normal range of motion. Neck supple. No JVD present. No thyromegaly present.  No carotid bruits  Cardiovascular: Normal rate, regular rhythm, normal heart sounds and intact distal pulses.  Exam reveals no gallop and no friction rub.   No murmur heard. At 72 per minute  Pulmonary/Chest: Effort normal and breath sounds normal. No respiratory distress. She has no wheezes. She has no rales. She exhibits no tenderness.  No axillary node  Abdominal: Soft. Bowel sounds are normal. She exhibits no mass. There is tenderness (slight suprapubicand. Umbilical tenderness, there is a vertical midline scar beneath the umbilicus). There is no rebound and no guarding.  No inguinal nodes  Musculoskeletal: Normal range of motion. She exhibits no edema and no tenderness.  Lymphadenopathy:    She has no cervical adenopathy.  Neurological: She is alert and oriented to person, place, and time. She has normal reflexes. No cranial nerve deficit.  The patient does have a  head tremor which is similar to the past  Skin: Skin is warm and dry.  Psychiatric: She has a normal mood and affect. Her behavior is normal. Judgment and thought content normal.   BP 147/70  Pulse 74  Temp(Src) 98.4 F (36.9 C) (Oral)  Ht 5\' 2"  (1.575 m)  Wt 140 lb (63.504 kg)  BMI 25.60 kg/m2  [facility] reading (PRIMARY) by  Dr.Elyas Villamor-chest x-ray-within normal limits, no active disease                                 Results for orders placed in visit on 03/20/13  FECAL OCCULT BLOOD, IMMUNOCHEMICAL      Result Value Range   Fecal Occult Bld Negative  Negative          Assessment & Plan:   1. Hyperlipemia - DG Chest 2 View; Future - POCT CBC - Hepatic function panel - BMP8+EGFR - NMR, lipoprofile  2. Tremor - POCT CBC  3. Vitamin D deficiency disease - POCT CBC - Vit D  25 hydroxy (rtn osteoporosis monitoring)  4. Postmenopausal - DG Bone Density; Future  5. HTN (hypertension) - DG Chest 2 View; Future - BMP8+EGFR  6. Abdominal  pain, other specified site -We will schedule abdominal ultrasound  Orders Placed This Encounter  Procedures  . DG Bone Density    Standing Status: Future     Number of Occurrences:      Standing Expiration Date: 07/02/2014    Order Specific Question:  Reason for Exam (SYMPTOM  OR DIAGNOSIS REQUIRED)    Answer:  postmenopausal    Order Specific Question:  Preferred imaging location?    Answer:  Internal  . DG Chest 2 View    Standing Status: Future     Number of Occurrences: 1     Standing Expiration Date: 07/01/2014    Order Specific Question:  Reason for Exam (SYMPTOM  OR DIAGNOSIS REQUIRED)    Answer:  htn , lipids    Order Specific Question:  Preferred imaging location?    Answer:  Internal  . Hepatic function panel  . BMP8+EGFR  . NMR, lipoprofile  . Vit D  25 hydroxy (rtn osteoporosis monitoring)  . POCT CBC   No orders of the defined types were placed in this encounter.   Patient Instructions  Continue current  medications. Continue good therapeutic lifestyle changes which include good diet and exercise. Fall precautions discussed with patient. Schedule your flu vaccine if you haven't had it yet If you are over 84 years old - you may need Prevnar 13 or the adult Pneumonia vaccine. Try to continue to avoid caffeine, milk cheese ice cream and dairy products Return FOBT We will schedule you for an abdominal and pelvic ultrasound   Nyra Capes MD

## 2013-05-01 NOTE — Addendum Note (Signed)
Addended by: Bearl Mulberry on: 05/01/2013 12:10 PM   Modules accepted: Orders

## 2013-05-02 ENCOUNTER — Other Ambulatory Visit: Payer: Self-pay | Admitting: Family Medicine

## 2013-05-02 ENCOUNTER — Ambulatory Visit (HOSPITAL_COMMUNITY)
Admission: RE | Admit: 2013-05-02 | Discharge: 2013-05-02 | Disposition: A | Discharge status: Home / Self Care | Payer: Medicare Other | Source: Ambulatory Visit | Attending: Family Medicine | Admitting: Family Medicine

## 2013-05-02 ENCOUNTER — Telehealth: Payer: Self-pay | Admitting: Family Medicine

## 2013-05-02 DIAGNOSIS — K7689 Other specified diseases of liver: Secondary | ICD-10-CM | POA: Insufficient documentation

## 2013-05-02 DIAGNOSIS — R109 Unspecified abdominal pain: Secondary | ICD-10-CM

## 2013-05-02 DIAGNOSIS — K8020 Calculus of gallbladder without cholecystitis without obstruction: Secondary | ICD-10-CM | POA: Insufficient documentation

## 2013-05-02 LAB — NMR, LIPOPROFILE
LDL Particle Number: 1628 nmol/L — ABNORMAL HIGH (ref <1000)
LDL Size: 21.4 nm (ref >20.5)
LDLC SERPL CALC-MCNC: 127 mg/dL — ABNORMAL HIGH (ref <100)
LP-IR Score: 25 (ref <=45)
Triglycerides by NMR: 148 mg/dL (ref <150)

## 2013-05-02 LAB — URINE CULTURE

## 2013-05-02 LAB — BMP8+EGFR
BUN: 8 mg/dL (ref 8–27)
Calcium: 9.2 mg/dL (ref 8.6–10.2)
Chloride: 103 mmol/L (ref 97–108)
GFR calc non Af Amer: 60 mL/min/{1.73_m2} (ref >59)
Glucose: 90 mg/dL (ref 65–99)
Potassium: 4.7 mmol/L (ref 3.5–5.2)

## 2013-05-02 LAB — HEPATIC FUNCTION PANEL
ALT: 21 IU/L (ref 0–32)
Bilirubin, Direct: 0.08 mg/dL (ref 0.00–0.40)

## 2013-05-03 NOTE — Telephone Encounter (Signed)
Pt notified of referral Verbalizes understanding 

## 2013-05-03 NOTE — Telephone Encounter (Signed)
Please schedule patient to see the surgical group in [place] for a i.e. Dr. Lavonda Jumbo group

## 2013-05-03 NOTE — Telephone Encounter (Signed)
Message copied by Roselee Culver on Wed May 03, 2013  8:44 AM ------      Message from: Ernestina Penna      Created: Tue May 02, 2013  2:04 PM       The abdominal ultrasound indicates he she has gallstones without infection and a fatty liver. If pain continues or gets worse we can certainly have a surgeon to see her but I do not think that is necessary at this time. No other abnormalities were worsening with kidney spleen or pancreas appear ------

## 2013-05-05 DIAGNOSIS — H35369 Drusen (degenerative) of macula, unspecified eye: Secondary | ICD-10-CM | POA: Insufficient documentation

## 2013-05-10 ENCOUNTER — Telehealth: Payer: Self-pay | Admitting: Family Medicine

## 2013-05-10 NOTE — Telephone Encounter (Signed)
Patient was being referred to a surgeon for gallbladder removal. Evidently Dr. Ezzard Standing is unavailable and she would like a suggestion on who to see.

## 2013-05-10 NOTE — Telephone Encounter (Signed)
All the people and this surgical group are good.

## 2013-05-15 ENCOUNTER — Telehealth: Payer: Self-pay | Admitting: Family Medicine

## 2013-05-15 DIAGNOSIS — R109 Unspecified abdominal pain: Secondary | ICD-10-CM

## 2013-05-15 DIAGNOSIS — R102 Pelvic and perineal pain: Secondary | ICD-10-CM

## 2013-05-17 ENCOUNTER — Other Ambulatory Visit: Payer: Medicare Other

## 2013-05-17 NOTE — Telephone Encounter (Signed)
Patient aware.

## 2013-05-18 LAB — FECAL OCCULT BLOOD, IMMUNOCHEMICAL: Fecal Occult Bld: NEGATIVE

## 2013-05-22 ENCOUNTER — Encounter: Payer: Self-pay | Admitting: *Deleted

## 2013-05-22 NOTE — Progress Notes (Signed)
Quick Note:  Copy of labs sent to patient ______ 

## 2013-05-24 ENCOUNTER — Ambulatory Visit (INDEPENDENT_AMBULATORY_CARE_PROVIDER_SITE_OTHER): Payer: Medicare Other | Admitting: Internal Medicine

## 2013-05-24 ENCOUNTER — Encounter: Payer: Self-pay | Admitting: Internal Medicine

## 2013-05-24 VITALS — BP 134/68 | HR 80 | Ht 62.0 in | Wt 140.2 lb

## 2013-05-24 DIAGNOSIS — Z800 Family history of malignant neoplasm of digestive organs: Secondary | ICD-10-CM

## 2013-05-24 DIAGNOSIS — R1319 Other dysphagia: Secondary | ICD-10-CM

## 2013-05-24 DIAGNOSIS — R197 Diarrhea, unspecified: Secondary | ICD-10-CM

## 2013-05-24 MED ORDER — HYOSCYAMINE SULFATE 0.125 MG SL SUBL: 0.1250 mg | SUBLINGUAL_TABLET | SUBLINGUAL | Status: DC | PRN

## 2013-05-24 MED ORDER — RANITIDINE HCL 150 MG PO TABS: 150.0000 mg | ORAL_TABLET | Freq: Every day | ORAL | Status: DC

## 2013-05-24 MED ORDER — NA SULFATE-K SULFATE-MG SULF 17.5-3.13-1.6 GM/177ML PO SOLN: ORAL | Status: DC

## 2013-05-24 NOTE — Patient Instructions (Signed)
You have been scheduled for an endoscopy and colonoscopy with propofol. Please follow the written instructions given to you at your visit today. Please pick up your prep at the pharmacy within the next 1-3 days. If you use inhalers (even only as needed), please bring them with you on the day of your procedure. Your physician has requested that you go to www.startemmi.com and enter the access code given to you at your visit today. This web site gives a general overview about your procedure. However, you should still follow specific instructions given to you by our office regarding your preparation for the procedure.  We have sent the following medications to your pharmacy for you to pick up at your convenience: Levsin SL  Zantac  CC: Dr Redge Gainer

## 2013-05-24 NOTE — Progress Notes (Signed)
Rachel Walton [birth year] [id number]  Note: This dictation was prepared with Dragon digital system. Any transcriptional errors that result from this procedure are unintentional.   History of Present Illness:  This is an 80 year old, white female who is here to discuss a recall colonoscopy as well as recent onset of solid food dysphagia. There are also new findings of gallstones. We saw her in the past for colorectal screenings. Prior colonoscopies in 2000 and 2003 showed tubuler adenoma. Her most recent colonoscopy in March 2009 showed diverticulosis. Her brother had colon cancer. She is due for a recall colonoscopy. Since October 2014, she has had diarrhea and crampy abdominal pain. She has urgent bowel movements but denies any bleeding. Her weight has remained stable. An upper abdominal ultrasound recently showed multiple small gallstonse in an otherwise normal-appearing gallbladder. Her common bile duct was 4 mm. She has a positive family history of gallbladder disease in 2 sisters and her mother. She denies having any attacks of gallbladder colic. On several occasions recently, the patient had a food impaction. It always passes after gagging and regurgitating. She has occasional heartburn. The dysphagia occurred with a meat bolus. She also has occasional dysphagia to liquids. He takes TUMS on an as necessary basis.    Past Medical History  Diagnosis Date  . Tremor   . Postmenopausal   . Hx of adenomatous colonic polyps   . Osteoporosis   . Kyphosis   . Recurrent UTI   . Atrophic vaginitis   . Diverticulosis   . Meningitis   . Cataract   . Hyperlipidemia   . Diverticulosis     Past Surgical History  Procedure Laterality Date  . Total abdominal hysterectomy w/ bilateral salpingoophorectomy    . Back surgery    . Appendectomy    . Cataract surgery    . Abdominal hysterectomy    . Breast surgery      Allergies  Allergen Reactions  . Alendronate Sodium Other (See Comments)   Caused chest pain  . Cephalexin   . Prednisone Other (See Comments)    Reaction is unknown..then states nervousness  . Sudafed [Pseudoephedrine Hcl] Other (See Comments)    insomnia  . Sulfa Antibiotics Hives  . Toprol Xl [Metoprolol Succinate] Other (See Comments)    Unknown reaction  . Trimethoprim     Unknown reaction    Family history and social history have been reviewed.  Review of Systems: Solid food dysphagia. Occasional heartburn. Crampy lower abdominal pain.  The remainder of the 10 point ROS is negative except as outlined in the H&P  Physical Exam: General Appearance Well developed, in no distress Eyes  Non icteric  HEENT  Non traumatic, normocephalic  Mouth No lesion, tongue papillated, no cheilosis Neck Supple without adenopathy, thyroid not enlarged, no carotid bruits, no JVD Lungs Clear to auscultation bilaterally COR Normal S1, normal S2, regular rhythm, no murmur, quiet precordium Abdomen soft with normoactive bowel sounds. No distention. Minimal tenderness in left lower quadrant and suprapubic area. Post hysterectomy scar, post appendectomy scar. Liver edge at costal margin Rectal soft Hemoccult negative stool Extremities  No pedal edema Skin No lesions Neurological Alert and oriented x 3 fine tremor  of the head Psychological Normal mood and affect  Assessment and Plan:   Problem #1 intermittent solid food dysphagia suggestive of either esophageal stricture, hiatal hernia or reflux esophagitis. She has occasional dysphagia to liquids which raises the possibility of dysmotility. We will start her on Zantac 150 mg daily and  schedule her for an upper endoscopy with possible dilation.  Problem #2 Change in bowel habits. Patient has had diarrhea and crampy abdominal pain since October 2014. 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 She has known diverticulosis. She is due for a recall colonoscopy which normally would  be optional at her age of 52 but because of the  new symptomatology, she agrees to proceed with a colonoscopy and random biopsies to rule out microscopic colitis.Start Levsin SL .125 mg q 4 hrs prn crampy abd. pain  Problem #3 Positive family history of colon cancer in patient's Brother. She has a personal history of adenomatous polyps. We will go ahead with a colonoscopy.  Problem #4 Gallstones in an otherwise normal-appearing gallbladder. Patient has an appointment with Dr. Lucia Gaskins for February 6. I'm not certain that her symptoms are related to her gallbladder. Her sister had a bile leak from an elective cholecystectomy. Her liver function tests in December were normal.     Delfin Edis 05/24/2013

## 2013-05-29 ENCOUNTER — Other Ambulatory Visit: Payer: Self-pay | Admitting: *Deleted

## 2013-05-29 MED ORDER — CLONAZEPAM 0.5 MG PO TABS: 0.5000 mg | ORAL_TABLET | Freq: Two times a day (BID) | ORAL | Status: DC | PRN

## 2013-05-29 NOTE — Telephone Encounter (Signed)
PHONED TO [place]

## 2013-05-29 NOTE — Telephone Encounter (Signed)
Call patient and let her know that prescription is ready for pickup

## 2013-06-06 ENCOUNTER — Ambulatory Visit (HOSPITAL_COMMUNITY)
Admission: RE | Admit: 2013-06-06 | Discharge: 2013-06-06 | Disposition: A | Discharge status: Home / Self Care | Payer: Medicare Other | Source: Ambulatory Visit | Attending: Family Medicine | Admitting: Family Medicine

## 2013-06-06 DIAGNOSIS — R109 Unspecified abdominal pain: Secondary | ICD-10-CM | POA: Insufficient documentation

## 2013-06-06 DIAGNOSIS — R918 Other nonspecific abnormal finding of lung field: Secondary | ICD-10-CM | POA: Insufficient documentation

## 2013-06-06 DIAGNOSIS — R102 Pelvic and perineal pain: Secondary | ICD-10-CM

## 2013-06-06 DIAGNOSIS — K8020 Calculus of gallbladder without cholecystitis without obstruction: Secondary | ICD-10-CM | POA: Insufficient documentation

## 2013-06-06 DIAGNOSIS — K7689 Other specified diseases of liver: Secondary | ICD-10-CM | POA: Insufficient documentation

## 2013-06-06 DIAGNOSIS — K5730 Diverticulosis of large intestine without perforation or abscess without bleeding: Secondary | ICD-10-CM | POA: Insufficient documentation

## 2013-06-06 MED ORDER — IOHEXOL 300 MG/ML  SOLN
100.0000 mL | Freq: Once | INTRAMUSCULAR | Status: AC | PRN
  Administered 2013-06-06: 100 mL via INTRAVENOUS

## 2013-06-07 ENCOUNTER — Telehealth: Payer: Self-pay

## 2013-06-07 NOTE — Telephone Encounter (Signed)
Message copied by Koren Bound on Wed Jun 07, 2013  9:47 AM ------      Message from: Chipper Herb      Created: Tue Jun 06, 2013  3:57 PM       No sign of any obstructive pathology or inflammatory pathology on the CT scan.      There is a fatty liver and some calcified gallstones and some diverticulosis without diverticulitis ------

## 2013-06-07 NOTE — Telephone Encounter (Signed)
Pt aware of results 

## 2013-06-07 NOTE — Telephone Encounter (Signed)
Message copied by Koren Bound on Wed Jun 07, 2013  9:48 AM ------      Message from: Chipper Herb      Created: Tue Jun 06, 2013  4:13 PM       Please call patient with the above results ------

## 2013-06-15 ENCOUNTER — Encounter (INDEPENDENT_AMBULATORY_CARE_PROVIDER_SITE_OTHER): Payer: Self-pay | Admitting: Surgery

## 2013-06-15 ENCOUNTER — Ambulatory Visit (INDEPENDENT_AMBULATORY_CARE_PROVIDER_SITE_OTHER): Payer: Medicare Other | Admitting: Surgery

## 2013-06-15 ENCOUNTER — Encounter (INDEPENDENT_AMBULATORY_CARE_PROVIDER_SITE_OTHER): Payer: Self-pay

## 2013-06-15 VITALS — BP 124/78 | HR 76 | Temp 98.0°F | Resp 18 | Ht 64.0 in | Wt 138.0 lb

## 2013-06-15 DIAGNOSIS — K8020 Calculus of gallbladder without cholecystitis without obstruction: Secondary | ICD-10-CM

## 2013-06-15 HISTORY — DX: Calculus of gallbladder without cholecystitis without obstruction: K80.20

## 2013-06-15 NOTE — Progress Notes (Signed)
Re:   Rachel Walton DOB:   [birth year] MRN:   [id number]  ASSESSMENT AND PLAN: 1.  Gall stones  I discussed with the patient the indications and risks of gall bladder surgery.  The primary risks of gall bladder surgery include, but are not limited to, bleeding, infection, common bile duct injury, and open surgery.  There is also the risk that the patient may have continued symptoms after surgery.  Her symptoms are not entirely typical, in that the pain is lower abdomen, but she does have certain food intolerance. We discussed the typical post-operative recovery course. I tried to answer the patient's questions.  I gave the patient literature about gall bladder surgery.  Plan: 1) Dr. Olevia Perches to do upper endo/colonoscopy 07/26/2013, 2) She'll see me back in 2-3 months to see how she is doing with her symptoms.  If symptoms resolved, continue to observe.  If persistent symptoms, consider cholecystectomy.  2.  Tremor  Exacerbated by general anesthesia 3.  Intermittent solid food dysphagia 4.  Diverticulosis  Chief Complaint  Patient presents with  . New Evaluation    G.B   REFERRING PHYSICIAN: Redge Gainer, MD  HISTORY OF PRESENT ILLNESS: Rachel Walton is a 80 y.o. (DOB: 1929-12-27)  white  female whose primary care physician is Redge Gainer, MD and comes to me today for gall stones. Husband with patient (he is a little hard of hearing).  The patient started having symptoms in about October, 2014, with bad lower mid abdominal pain. She has to go to the bathroom for BM with urgency. She does not tolerate spicy or fried foods and she has limited chocolate intake.  She has no prior upper endo.  No liver disease.  She had her appendix removed at age 37.  She has had a hysterectomy.  Her last colonoscopy was in 2008.  She is scheduled for upper endo/colonoscopy on 07/26/2013. She knew Dr. Sheilah Pigeon -  He did her husband's hernia surgery. She's had multiple family members with gallstones -sisters and  mother.  One sister had bile duct issues post op and was in hospital x 21 days in [place].  I think this complication weighs on [name] mind.  [place] - 05/02/2013 - Hepatic steatosis.  Mobile gall stones. CT scan - 06/06/2013 - hepatic steatosis, calcified gall stones in contracted gall bladder, extra right renal pelvis, diverticulosis   Past Medical History  Diagnosis Date  . Tremor   . Postmenopausal   . Hx of adenomatous colonic polyps   . Osteoporosis   . Kyphosis   . Recurrent UTI   . Atrophic vaginitis   . Diverticulosis   . Meningitis   . Cataract   . Hyperlipidemia   . Diverticulosis       Past Surgical History  Procedure Laterality Date  . Total abdominal hysterectomy w/ bilateral salpingoophorectomy    . Back surgery    . Appendectomy    . Cataract surgery    . Abdominal hysterectomy    . Breast surgery        Current Outpatient Prescriptions  Medication Sig Dispense Refill  . aspirin 325 MG tablet Take 325 mg by mouth daily as needed for pain (Takes this strength only for headache pain).      Marland Kitchen aspirin 81 MG EC tablet Take 81 mg by mouth daily as needed.       . Cholecalciferol (VITAMIN D3) 2000 UNITS TABS Take 1 tablet by mouth 2 (two) times a week. As  needed      . clonazePAM (KLONOPIN) 0.5 MG tablet Take 1 tablet (0.5 mg total) by mouth 2 (two) times daily as needed for anxiety.  60 tablet  5  . fish oil-omega-3 fatty acids 1000 MG capsule Take 2 g by mouth daily.      . Multiple Vitamins-Minerals (ICAPS AREDS FORMULA PO) Take 1 tablet by mouth 2 (two) times daily.      . Multiple Vitamins-Minerals (STRESS TAB NF PO) Take 1 tablet by mouth daily.      . naphazoline-pheniramine (NAPHCON-A) 0.025-0.3 % ophthalmic solution 3 (three) times a week.      . polyvinyl alcohol-povidone (REFRESH) 1.4-0.6 % ophthalmic solution Place 1-2 drops into both eyes 2 (two) times daily.       . rosuvastatin (CRESTOR) 5 MG tablet Take 2.5 mg by mouth 3 (three) times a week.        Marland Kitchen SALINE NASAL MIST NA Place 1 spray into the nose 2 (two) times daily.      . hyoscyamine (LEVSIN/SL) 0.125 MG SL tablet Place 1 tablet (0.125 mg total) under the tongue every 4 (four) hours as needed for cramping (abdominal pain).  30 tablet  1  . Na Sulfate-K Sulfate-Mg Sulf SOLN Use as directed  1 Bottle  0  . ranitidine (ZANTAC) 150 MG tablet Take 1 tablet (150 mg total) by mouth daily.  30 tablet  2   No current facility-administered medications for this visit.      Allergies  Allergen Reactions  . Alendronate Sodium Other (See Comments)    Caused chest pain  . Cephalexin   . Prednisone Other (See Comments)    Reaction is unknown..then states nervousness  . Sudafed [Pseudoephedrine Hcl] Other (See Comments)    insomnia  . Sulfa Antibiotics Hives  . Toprol Xl [Metoprolol Succinate] Other (See Comments)    Unknown reaction  . Trimethoprim     Unknown reaction    REVIEW OF SYSTEMS: Skin:  No history of rash.  No history of abnormal moles. Infection:  No history of hepatitis or HIV.  No history of MRSA. Neurologic:  Tremor. She blames the tremor on prior general anesthesia.  Has seen Dr. Donato Schultz, in [place].  History of viral meningitis - 2005 - hospitalized [facility] Cardiac:  History of "leakage" heart valve.  Not seeing a cardiologist at this time. Pulmonary:  Had pneumonia treated as an outpatient about 2010.  Endocrine:  No diabetes. No thyroid disease. Gastrointestinal:  See HPI.  Plans for upper endo/colonoscopy by Dr. Olevia Perches - 07/26/2013 Urologic:  History of recurrent bladder infections.  Sees Dr. Jeffie Pollock. GYN:  History of hysterectomy. Musculoskeletal:  Back surgery by Dr. Sherwood Gambler - 1994. Hematologic:  No bleeding disorder.  No history of anemia.  Not anticoagulated. Psycho-social:  The patient is oriented.   The patient has no obvious psychologic or social impairment to understanding our conversation and plan.  SOCIAL and FAMILY HISTORY: Married.  Husband  is with her. She has 3 sons:  2 live local, one lives in [place]. Her husband had hernia surgery by Dr. Sheilah Pigeon.  PHYSICAL EXAM: BP 124/78  Pulse 76  Temp(Src) 98 F (36.7 C)  Resp 18  Ht 5\' 4"  (1.626 m)  Wt 138 lb (62.596 kg)  BMI 23.68 kg/m2  General: WN older WF who is alert and generally healthy appearing. She has a noticeable tremor. HEENT: Normal. Pupils equal. Neck: Supple. No mass.  No thyroid mass. Lymph Nodes:  No supraclavicular  or cervical nodes. Lungs: Clear to auscultation and symmetric breath sounds. Heart:  RRR. No murmur or rub. Abdomen: Soft. No mass. No tenderness. No hernia. Normal bowel sounds.  Lower midline scar and RLQ scar. Rectal: Not done. Extremities:  Good strength and ROM  in upper and lower extremities. Neurologic:  Grossly intact to motor and sensory function. Psychiatric: Has normal mood and affect. Behavior is normal.   DATA REVIEWED: Epic notes and Dr. Nichola Sizer note.  Alphonsa Overall, MD,  [facility], [address]  [place], [place]    [place] Phone:  [phone] FAX:  [phone]

## 2013-06-29 ENCOUNTER — Telehealth: Payer: Self-pay | Admitting: Family Medicine

## 2013-06-29 ENCOUNTER — Ambulatory Visit (INDEPENDENT_AMBULATORY_CARE_PROVIDER_SITE_OTHER): Payer: Medicare Other | Admitting: Family Medicine

## 2013-06-29 ENCOUNTER — Encounter: Payer: Self-pay | Admitting: Family Medicine

## 2013-06-29 VITALS — BP 186/74 | HR 73 | Temp 98.4°F | Ht 64.0 in | Wt 139.0 lb

## 2013-06-29 DIAGNOSIS — L259 Unspecified contact dermatitis, unspecified cause: Secondary | ICD-10-CM

## 2013-06-29 MED ORDER — METHYLPREDNISOLONE ACETATE 80 MG/ML IJ SUSP
60.0000 mg | Freq: Once | INTRAMUSCULAR | Status: AC
  Administered 2013-06-29: 60 mg via INTRAMUSCULAR

## 2013-06-29 NOTE — Patient Instructions (Signed)
Take Benadryl 25 mg 4 times daily as needed Apply cortisone 10 sparingly 3-4 times daily to both forearms as directed Stay away from any flagrant soaps detergents and fabric softer

## 2013-06-29 NOTE — Telephone Encounter (Signed)
appt scheduled Pt notified 

## 2013-06-29 NOTE — Progress Notes (Signed)
Subjective:    Patient ID: Rachel Walton, female    DOB: 1929-10-25, 80 y.o.   MRN: [id number]  HPI Patient here today for rash on arms and neck. Patient's husband has been treated for poison oak.      Patient Active Problem List   Diagnosis Date Noted  . Gall stones 06/15/2013  . Hyperlipemia 11/21/2012  . Tremor   . Postmenopausal   . Colon polyp   . Osteoporosis   . Kyphosis   . Recurrent UTI   . Atrophic vaginitis   . Diverticulosis   . Meningitis    Outpatient Encounter Prescriptions as of 06/29/2013  Medication Sig  . aspirin 325 MG tablet Take 325 mg by mouth daily as needed for pain (Takes this strength only for headache pain).  Marland Kitchen aspirin 81 MG EC tablet Take 81 mg by mouth daily as needed.   . Cholecalciferol (VITAMIN D3) 2000 UNITS TABS Take 1 tablet by mouth 2 (two) times a week. As needed  . clonazePAM (KLONOPIN) 0.5 MG tablet Take 1 tablet (0.5 mg total) by mouth 2 (two) times daily as needed for anxiety.  . fish oil-omega-3 fatty acids 1000 MG capsule Take 2 g by mouth daily.  . hyoscyamine (LEVSIN/SL) 0.125 MG SL tablet Place 1 tablet (0.125 mg total) under the tongue every 4 (four) hours as needed for cramping (abdominal pain).  . Multiple Vitamins-Minerals (ICAPS AREDS FORMULA PO) Take 1 tablet by mouth 2 (two) times daily.  . Multiple Vitamins-Minerals (STRESS TAB NF PO) Take 1 tablet by mouth daily.  . Na Sulfate-K Sulfate-Mg Sulf SOLN Use as directed  . naphazoline-pheniramine (NAPHCON-A) 0.025-0.3 % ophthalmic solution 3 (three) times a week.  . polyvinyl alcohol-povidone (REFRESH) 1.4-0.6 % ophthalmic solution Place 1-2 drops into both eyes 2 (two) times daily.   . ranitidine (ZANTAC) 150 MG tablet Take 1 tablet (150 mg total) by mouth daily.  . rosuvastatin (CRESTOR) 5 MG tablet Take 2.5 mg by mouth 3 (three) times a week.   Marland Kitchen SALINE NASAL MIST NA Place 1 spray into the nose 2 (two) times daily.    Review of Systems  Constitutional: Negative.     HENT: Negative.   Eyes: Negative.   Respiratory: Negative.   Cardiovascular: Negative.   Gastrointestinal: Negative.   Endocrine: Negative.   Genitourinary: Negative.   Musculoskeletal: Negative.   Skin: Positive for rash.  Allergic/Immunologic: Negative.   Neurological: Negative.   Hematological: Negative.   Psychiatric/Behavioral: Negative.        Objective:   Physical Exam  Nursing note and vitals reviewed. Constitutional: She appears well-developed and well-nourished. No distress.  HENT:  Head: Normocephalic and atraumatic.  Eyes: Conjunctivae and EOM are normal. Pupils are equal, round, and reactive to light. Right eye exhibits no discharge. Left eye exhibits no discharge. No scleral icterus.  Neck: Normal range of motion.  Musculoskeletal: Normal range of motion. She exhibits no edema.  Skin: Skin is warm and dry. Rash noted.  Papular rash both hands and forearms  Psychiatric: She has a normal mood and affect. Her behavior is normal. Judgment and thought content normal.   BP 186/74  Pulse 73  Temp(Src) 98.4 F (36.9 C) (Oral)  Ht 5\' 4"  (1.626 m)  Wt 139 lb (63.05 kg)  BMI 23.85 kg/m2        Assessment & Plan:  1. Contact dermatitis - methylPREDNISolone acetate (DEPO-MEDROL) injection 60 mg; Inject 0.75 mLs (60 mg total) into the muscle once.  Patient  Instructions  Take Benadryl 25 mg 4 times daily as needed Apply cortisone 10 sparingly 3-4 times daily to both forearms as directed Stay away from any flagrant soaps detergents and fabric softer   Arrie Senate MD

## 2013-07-10 ENCOUNTER — Encounter: Payer: Self-pay | Admitting: Family Medicine

## 2013-07-10 ENCOUNTER — Ambulatory Visit (INDEPENDENT_AMBULATORY_CARE_PROVIDER_SITE_OTHER): Payer: Medicare Other | Admitting: Family Medicine

## 2013-07-10 ENCOUNTER — Ambulatory Visit (INDEPENDENT_AMBULATORY_CARE_PROVIDER_SITE_OTHER): Payer: Medicare Other

## 2013-07-10 ENCOUNTER — Telehealth: Payer: Self-pay | Admitting: Family Medicine

## 2013-07-10 VITALS — BP 119/56 | HR 73 | Temp 99.2°F | Ht 64.0 in | Wt 139.0 lb

## 2013-07-10 DIAGNOSIS — J40 Bronchitis, not specified as acute or chronic: Secondary | ICD-10-CM

## 2013-07-10 DIAGNOSIS — R059 Cough, unspecified: Secondary | ICD-10-CM

## 2013-07-10 DIAGNOSIS — R05 Cough: Secondary | ICD-10-CM

## 2013-07-10 MED ORDER — BENZONATATE 100 MG PO CAPS: 100.0000 mg | ORAL_CAPSULE | Freq: Three times a day (TID) | ORAL | Status: DC | PRN

## 2013-07-10 MED ORDER — AZITHROMYCIN 250 MG PO TABS: ORAL_TABLET | ORAL | Status: DC

## 2013-07-10 NOTE — Telephone Encounter (Signed)
appt today with newton

## 2013-07-10 NOTE — Progress Notes (Signed)
   Subjective:    Patient ID: Rachel Walton, female    DOB: Apr 24, 1930, 80 y.o.   MRN: [id number]  HPI URI Symptoms Onset: 4-5 days  Description: nasal congestion, sinus drainage, cough  Modifying factors:  Remote hx/o PNA  Symptoms Nasal discharge: yes Fever: no Sore throat: no Cough: yes Wheezing: no Ear pain: no GI symptoms: no Sick contacts: yes  Red Flags  Stiff neck: no Dyspnea: minimal  Rash: no Swallowing difficulty: no  Sinusitis Risk Factors Headache/face pain: no Double sickening: no tooth pain: no  Allergy Risk Factors Sneezing: no Itchy scratchy throat: no Seasonal symptoms: no  Flu Risk Factors Headache: no muscle aches: no severe fatigue: no     Review of Systems  All other systems reviewed and are negative.       Objective:   Physical Exam  Constitutional: She appears well-developed and well-nourished.  HENT:  Head: Normocephalic and atraumatic.  Right Ear: External ear normal.  Left Ear: External ear normal.  +nasal erythema, rhinorrhea bilaterally, + post oropharyngeal erythema    Eyes: Conjunctivae are normal. Pupils are equal, round, and reactive to light.  Neck: Normal range of motion. Neck supple.  Cardiovascular: Normal rate and regular rhythm.   Pulmonary/Chest: Effort normal and breath sounds normal. No respiratory distress. She has no wheezes.  Abdominal: Soft.  Neurological: She is alert.  Skin: Skin is warm.   [facility] reading (PRIMARY) by  Dr. Ernestina Patches  Preliminary CXR read negative for any acute focal infiltrate though with ? RLL opacifications. Pendiing formal read.                                          Assessment & Plan:  Cough - Plan: DG Chest 2 View  Bronchitis - Plan: azithromycin (ZITHROMAX) 250 MG tablet, benzonatate (TESSALON) 100 MG capsule, DISCONTINUED: benzonatate (TESSALON) 100 MG capsule  Will place pt on course of zpak for lower resp coverage.  No focal infiltrate on prelminary read though with  ? RLL opacity.  Tessalon perles for cough.  Fairly reassuring resp exam today. No resp distress or hypoxia.  Discussed infectious and resp red flags at length.  Follow up as needed.

## 2013-07-13 ENCOUNTER — Telehealth: Payer: Self-pay | Admitting: Family Medicine

## 2013-07-13 ENCOUNTER — Other Ambulatory Visit: Payer: Self-pay | Admitting: Family Medicine

## 2013-07-13 MED ORDER — AMOXICILLIN 875 MG PO TABS: 875.0000 mg | ORAL_TABLET | Freq: Two times a day (BID) | ORAL | Status: DC

## 2013-07-13 NOTE — Telephone Encounter (Signed)
Amoxicillin sent to her pharmacy, cxr showed copd but otherwise normal

## 2013-07-13 NOTE — Telephone Encounter (Signed)
Patient aware.

## 2013-07-26 ENCOUNTER — Encounter: Payer: Medicare Other | Admitting: Internal Medicine

## 2013-08-03 ENCOUNTER — Other Ambulatory Visit (INDEPENDENT_AMBULATORY_CARE_PROVIDER_SITE_OTHER): Payer: Medicare Other

## 2013-08-03 ENCOUNTER — Other Ambulatory Visit: Payer: Self-pay | Admitting: *Deleted

## 2013-08-03 DIAGNOSIS — R5383 Other fatigue: Secondary | ICD-10-CM

## 2013-08-03 DIAGNOSIS — R531 Weakness: Secondary | ICD-10-CM

## 2013-08-03 DIAGNOSIS — R5381 Other malaise: Secondary | ICD-10-CM

## 2013-08-03 LAB — POCT CBC
Granulocyte percent: 65.1 %G (ref 37–80)
HEMATOCRIT: 42.1 % (ref 37.7–47.9)
HEMOGLOBIN: 13.3 g/dL (ref 12.2–16.2)
Lymph, poc: 2.3 (ref 0.6–3.4)
MCH, POC: 27.7 pg (ref 27–31.2)
MCHC: 31.5 g/dL — AB (ref 31.8–35.4)
MCV: 87.9 fL (ref 80–97)
MPV: 7.9 fL (ref 0–99.8)
POC GRANULOCYTE: 4.4 (ref 2–6.9)
POC LYMPH PERCENT: 33.6 %L (ref 10–50)
Platelet Count, POC: 256 10*3/uL (ref 142–424)
RBC: 4.8 M/uL (ref 4.04–5.48)
RDW, POC: 14.2 %
WBC: 6.7 10*3/uL (ref 4.6–10.2)

## 2013-08-03 NOTE — Progress Notes (Unsigned)
[facility] Pt came by and states she has felt weak for a few weeks and feels like her iron pill is not working CBC checked while here

## 2013-08-21 ENCOUNTER — Encounter: Payer: Self-pay | Admitting: Family Medicine

## 2013-08-21 ENCOUNTER — Ambulatory Visit (INDEPENDENT_AMBULATORY_CARE_PROVIDER_SITE_OTHER): Payer: Medicare Other | Admitting: Family Medicine

## 2013-08-21 VITALS — BP 179/73 | HR 68 | Temp 98.1°F | Ht 64.0 in | Wt 138.0 lb

## 2013-08-21 DIAGNOSIS — F411 Generalized anxiety disorder: Secondary | ICD-10-CM | POA: Insufficient documentation

## 2013-08-21 DIAGNOSIS — K589 Irritable bowel syndrome without diarrhea: Secondary | ICD-10-CM

## 2013-08-21 DIAGNOSIS — R5381 Other malaise: Secondary | ICD-10-CM

## 2013-08-21 DIAGNOSIS — R259 Unspecified abnormal involuntary movements: Secondary | ICD-10-CM

## 2013-08-21 DIAGNOSIS — R5383 Other fatigue: Secondary | ICD-10-CM

## 2013-08-21 DIAGNOSIS — E785 Hyperlipidemia, unspecified: Secondary | ICD-10-CM

## 2013-08-21 DIAGNOSIS — E559 Vitamin D deficiency, unspecified: Secondary | ICD-10-CM

## 2013-08-21 DIAGNOSIS — R251 Tremor, unspecified: Secondary | ICD-10-CM

## 2013-08-21 LAB — POCT CBC
Granulocyte percent: 55.8 %G (ref 37–80)
HCT, POC: 40.4 % (ref 37.7–47.9)
Hemoglobin: 13.1 g/dL (ref 12.2–16.2)
LYMPH, POC: 2.6 (ref 0.6–3.4)
MCH, POC: 28.4 pg (ref 27–31.2)
MCHC: 32.4 g/dL (ref 31.8–35.4)
MCV: 87.7 fL (ref 80–97)
MPV: 9.9 fL (ref 0–99.8)
PLATELET COUNT, POC: 259 10*3/uL (ref 142–424)
POC Granulocyte: 3.6 (ref 2–6.9)
POC LYMPH %: 40.4 % (ref 10–50)
RBC: 4.6 M/uL (ref 4.04–5.48)
RDW, POC: 14 %
WBC: 6.4 10*3/uL (ref 4.6–10.2)

## 2013-08-21 NOTE — Patient Instructions (Addendum)
Medicare Annual Wellness Visit  [facility] and the medical providers at [facility] strive to bring you the best medical care.  In doing so we not only want to address your current medical conditions and concerns but also to detect new conditions early and prevent illness, disease and health-related problems.    Medicare offers a yearly Wellness Visit which allows our clinical staff to assess your need for preventative services including immunizations, lifestyle education, counseling to decrease risk of preventable diseases and screening for fall risk and other medical concerns.    This visit is provided free of charge (no copay) for all Medicare recipients. The clinical pharmacists at [facility] have begun to conduct these Wellness Visits which will also include a thorough review of all your medications.    As you primary medical provider recommend that you make an appointment for your Annual Wellness Visit if you have not done so already this year.  You may set up this appointment before you leave today or you may call back ([phone]) and schedule an appointment.  Please make sure when you call that you mention that you are scheduling your Annual Wellness Visit with the clinical pharmacist so that the appointment may be made for the proper length of time.     Continue current medications. Continue good therapeutic lifestyle changes which include good diet and exercise. Fall precautions discussed with patient. If an FOBT was given today- please return it to our front desk. If you are over 44 years old - you may need Prevnar 44 or the adult Pneumonia vaccine. BRING IN BLOOD PRESSURE READINGS IN ABOUT 2-3 WEEKS!  Continue to use Nasacort over-the-counter 1-2 sprays each nostril at bedtime Continue saline nose Avoid milk and dairy products. These can give you or aggravate your irritable bowel syndrome more Avoid caffeine and  always cook meats well

## 2013-08-21 NOTE — Progress Notes (Signed)
Subjective:    Patient ID: Rachel Walton, female    DOB: [birth year], 80 y.o.   MRN: [id number]  HPI Pt here for follow up and management of chronic medical problems. The patient does complain today of nasal congestion and drainage. She is also having some problems with diarrhea. She also complains of fatigue and weakness. She is getting lab work done today and is scheduled for a colonoscopy in early May with Dr. Delfin Edis. On the other health maintenance issues she is due for a DEXA scan this fall.        Patient Active Problem List   Diagnosis Date Noted  . Gall stones 06/15/2013  . Hyperlipemia 11/21/2012  . Tremor   . Postmenopausal   . Colon polyp   . Osteoporosis   . Kyphosis   . Recurrent UTI   . Atrophic vaginitis   . Diverticulosis   . Meningitis, history of    Outpatient Encounter Prescriptions as of 08/21/2013  Medication Sig  . aspirin 325 MG tablet Take 325 mg by mouth daily as needed for pain (Takes this strength only for headache pain).  Marland Kitchen aspirin 81 MG EC tablet Take 81 mg by mouth daily as needed.   . Cholecalciferol (VITAMIN D3) 2000 UNITS TABS Take 1 tablet by mouth 2 (two) times a week. As needed  . clonazePAM (KLONOPIN) 0.5 MG tablet Take 1 tablet (0.5 mg total) by mouth 2 (two) times daily as needed for anxiety.  . fish oil-omega-3 fatty acids 1000 MG capsule Take 2 g by mouth daily.  . Multiple Vitamins-Minerals (ICAPS AREDS FORMULA PO) Take 1 tablet by mouth 2 (two) times daily.  . Multiple Vitamins-Minerals (STRESS TAB NF PO) Take 1 tablet by mouth daily.  . naphazoline-pheniramine (NAPHCON-A) 0.025-0.3 % ophthalmic solution 3 (three) times a week.  . polyvinyl alcohol-povidone (REFRESH) 1.4-0.6 % ophthalmic solution Place 1-2 drops into both eyes 2 (two) times daily.   . rosuvastatin (CRESTOR) 5 MG tablet Take 2.5 mg by mouth 3 (three) times a week.   . sodium chloride (OCEAN) 0.65 % SOLN nasal spray Place 1 spray into both nostrils as needed for  congestion.  . Triamcinolone Acetonide (NASACORT ALLERGY 24HR NA) Place into the nose.  . [DISCONTINUED] amoxicillin (AMOXIL) 875 MG tablet Take 1 tablet (875 mg total) by mouth 2 (two) times daily.  . [DISCONTINUED] azithromycin (ZITHROMAX) 250 MG tablet Take 2 tabs PO x 1 dose, then 1 tab PO QD x 4 days  . [DISCONTINUED] benzonatate (TESSALON) 100 MG capsule Take 1-2 capsules (100-200 mg total) by mouth 3 (three) times daily as needed for cough.  . [DISCONTINUED] hyoscyamine (LEVSIN/SL) 0.125 MG SL tablet Place 1 tablet (0.125 mg total) under the tongue every 4 (four) hours as needed for cramping (abdominal pain).  . [DISCONTINUED] ranitidine (ZANTAC) 150 MG tablet Take 1 tablet (150 mg total) by mouth daily.  . [DISCONTINUED] SALINE NASAL MIST NA Place 1 spray into the nose 2 (two) times daily.    Review of Systems  Constitutional: Positive for fatigue.  HENT: Positive for postnasal drip (allergy-related?).   Eyes: Negative.   Respiratory: Negative.   Cardiovascular: Negative.   Gastrointestinal: Positive for diarrhea (goes immediately following a meal).  Endocrine: Negative.   Genitourinary: Negative.   Musculoskeletal: Negative.   Skin: Negative.   Allergic/Immunologic: Negative.   Neurological: Positive for weakness.  Hematological: Negative.   Psychiatric/Behavioral: Negative.        Objective:   Physical Exam  Nursing  note and vitals reviewed. Constitutional: She is oriented to person, place, and time. She appears well-developed and well-nourished. No distress.  HENT:  Head: Normocephalic and atraumatic.  Right Ear: External ear normal.  Left Ear: External ear normal.  Nose: Nose normal.  Mouth/Throat: Oropharynx is clear and moist. No oropharyngeal exudate.  Eyes: Conjunctivae and EOM are normal. Pupils are equal, round, and reactive to light. Right eye exhibits no discharge. Left eye exhibits no discharge. No scleral icterus.  Neck: Normal range of motion. Neck  supple. No thyromegaly present.  Cardiovascular: Normal rate, regular rhythm, normal heart sounds and intact distal pulses.  Exam reveals no gallop and no friction rub.   No murmur heard. At 72 per minute  Pulmonary/Chest: Effort normal and breath sounds normal. No respiratory distress. She has no wheezes. She has no rales. She exhibits no tenderness.  Dry cough  Abdominal: Soft. Bowel sounds are normal. She exhibits no mass. There is no tenderness. There is no rebound and no guarding.  Specifically, no tenderness or masses with normal bowel sounds.  Musculoskeletal: Normal range of motion. She exhibits no edema and no tenderness.  Lymphadenopathy:    She has no cervical adenopathy.  Neurological: She is alert and oriented to person, place, and time. She has normal reflexes.  Patient continues to have a tremor which she has had in the past. This is most pronounced with her head and neck.  Skin: Skin is warm and dry.  Psychiatric: She has a normal mood and affect. Her behavior is normal. Judgment and thought content normal.   BP 179/73  Pulse 68  Temp(Src) 98.1 F (36.7 C) (Oral)  Ht _0  (1.626 m)  Wt 138 lb (62.596 kg)  BMI 23.68 kg/m2        Assessment & Plan:  1. Hyperlipemia - POCT CBC - Hepatic function panel  2. Tremor - POCT CBC - Vit D  25 hydroxy (rtn osteoporosis monitoring)  3. Other malaise and fatigue - POCT CBC - BMP8+EGFR - Hepatic function panel - NMR, lipoprofile - Vit D  25 hydroxy (rtn osteoporosis monitoring)  4. Vitamin D deficiency - Vit D  25 hydroxy (rtn osteoporosis monitoring)  5. Generalized anxiety disorder  6. IBS (irritable bowel syndrome) Patient Instructions                       Medicare Annual Wellness Visit  [facility] and the medical providers at [facility] strive to bring you the best medical care.  In doing so we not only want to address your current medical conditions and concerns but also to detect  new conditions early and prevent illness, disease and health-related problems.    Medicare offers a yearly Wellness Visit which allows our clinical staff to assess your need for preventative services including immunizations, lifestyle education, counseling to decrease risk of preventable diseases and screening for fall risk and other medical concerns.    This visit is provided free of charge (no copay) for all Medicare recipients. The clinical pharmacists at [facility] have begun to conduct these Wellness Visits which will also include a thorough review of all your medications.    As you primary medical provider recommend that you make an appointment for your Annual Wellness Visit if you have not done so already this year.  You may set up this appointment before you leave today or you may call back ([phone]) and schedule an appointment.  Please make  sure when you call that you mention that you are scheduling your Annual Wellness Visit with the clinical pharmacist so that the appointment may be made for the proper length of time.     Continue current medications. Continue good therapeutic lifestyle changes which include good diet and exercise. Fall precautions discussed with patient. If an FOBT was given today- please return it to our front desk. If you are over 29 years old - you may need Prevnar 74 or the adult Pneumonia vaccine. BRING IN BLOOD PRESSURE READINGS IN ABOUT 2-3 WEEKS!  Continue to use Nasacort over-the-counter 1-2 sprays each nostril at bedtime Continue saline nose Avoid milk and dairy products. These can give you or aggravate your irritable bowel syndrome more Avoid caffeine and always cook meats well   Arrie Senate MD

## 2013-08-22 LAB — NMR, LIPOPROFILE
Cholesterol: 218 mg/dL — ABNORMAL HIGH (ref <200)
HDL Cholesterol by NMR: 93 mg/dL (ref >=40)
HDL Particle Number: 42.7 umol/L (ref >=30.5)
LDL Particle Number: 1040 nmol/L — ABNORMAL HIGH (ref <1000)
LDL SIZE: 21.5 nm (ref >20.5)
LDLC SERPL CALC-MCNC: 102 mg/dL — ABNORMAL HIGH (ref <100)
LP-IR Score: 26 (ref <=45)
TRIGLYCERIDES BY NMR: 114 mg/dL (ref <150)

## 2013-08-22 LAB — HEPATIC FUNCTION PANEL
ALT: 31 IU/L (ref 0–32)
AST: 29 IU/L (ref 0–40)
Albumin: 3.7 g/dL (ref 3.5–4.7)
Alkaline Phosphatase: 60 IU/L (ref 39–117)
BILIRUBIN DIRECT: 0.09 mg/dL (ref 0.00–0.40)
BILIRUBIN TOTAL: 0.3 mg/dL (ref 0.0–1.2)
Total Protein: 5.6 g/dL — ABNORMAL LOW (ref 6.0–8.5)

## 2013-08-22 LAB — BMP8+EGFR
BUN/Creatinine Ratio: 8 — ABNORMAL LOW (ref 11–26)
BUN: 7 mg/dL — ABNORMAL LOW (ref 8–27)
CALCIUM: 9 mg/dL (ref 8.7–10.3)
CO2: 26 mmol/L (ref 18–29)
CREATININE: 0.86 mg/dL (ref 0.57–1.00)
Chloride: 104 mmol/L (ref 97–108)
GFR calc Af Amer: 73 mL/min/{1.73_m2} (ref >59)
GFR, EST NON AFRICAN AMERICAN: 64 mL/min/{1.73_m2} (ref >59)
Glucose: 97 mg/dL (ref 65–99)
POTASSIUM: 4.1 mmol/L (ref 3.5–5.2)
SODIUM: 145 mmol/L — AB (ref 134–144)

## 2013-08-22 LAB — VITAMIN D 25 HYDROXY (VIT D DEFICIENCY, FRACTURES): Vit D, 25-Hydroxy: 29.1 ng/mL — ABNORMAL LOW (ref 30.0–100.0)

## 2013-09-12 ENCOUNTER — Telehealth: Payer: Self-pay | Admitting: Gastroenterology

## 2013-09-12 NOTE — Telephone Encounter (Signed)
She vomited most of the first dose of her prep this evening.  Some must have gotten through because she's started having diarrhea already.  I recommended she drink 1/2 bottle of magnesium citrate to supplement tonight and then continue the morning prep as already instructed.

## 2013-09-13 ENCOUNTER — Encounter: Payer: Self-pay | Admitting: Internal Medicine

## 2013-09-13 ENCOUNTER — Ambulatory Visit (AMBULATORY_SURGERY_CENTER): Payer: Medicare Other | Admitting: Internal Medicine

## 2013-09-13 VITALS — BP 134/57 | HR 68 | Temp 97.5°F | Resp 20 | Ht 62.0 in | Wt 140.0 lb

## 2013-09-13 DIAGNOSIS — K2210 Ulcer of esophagus without bleeding: Secondary | ICD-10-CM

## 2013-09-13 DIAGNOSIS — Z1211 Encounter for screening for malignant neoplasm of colon: Secondary | ICD-10-CM

## 2013-09-13 DIAGNOSIS — R1310 Dysphagia, unspecified: Secondary | ICD-10-CM

## 2013-09-13 DIAGNOSIS — Z800 Family history of malignant neoplasm of digestive organs: Secondary | ICD-10-CM

## 2013-09-13 MED ORDER — OMEPRAZOLE 40 MG PO CPDR: 40.0000 mg | DELAYED_RELEASE_CAPSULE | Freq: Two times a day (BID) | ORAL | Status: DC

## 2013-09-13 MED ORDER — SODIUM CHLORIDE 0.9 % IV SOLN: 500.0000 mL | INTRAVENOUS | Status: DC

## 2013-09-13 NOTE — Progress Notes (Signed)
A/ox3 pleased with MAC, report to April RN 

## 2013-09-13 NOTE — Progress Notes (Signed)
Called to room to assist during endoscopic procedure.  Patient ID and intended procedure confirmed with present staff. Received instructions for my participation in the procedure from the performing physician.  

## 2013-09-13 NOTE — Op Note (Signed)
[place]  [address]. [place], [id number]   ENDOSCOPY PROCEDURE REPORT  PATIENT: Rachel Walton, Rachel Walton  MR#: [id number] BIRTHDATE: 1930/03/06 , 83  yrs. old GENDER: Female ENDOSCOPIST: Lafayette Dragon, MD REFERRED BY:  Redge Gainer, M.D. PROCEDURE DATE:  09/13/2013 PROCEDURE:  EGD w/ biopsy ASA CLASS:     Class II INDICATIONS:  Dysphagia.   Chest pain.   episodes of food impaction.  MEDICATIONS: MAC sedation, administered by CRNA and Propofol (Diprivan) 120 mg IV TOPICAL ANESTHETIC: none  DESCRIPTION OF PROCEDURE: After the risks benefits and alternatives of the procedure were thoroughly explained, informed consent was obtained.  The LB [id number] endoscope was introduced through the mouth and advanced to the second portion of the duodenum. Without limitations.  The instrument was slowly withdrawn as the mucosa was fully examined.      Esophagus: Proximal and mid esophageal mucosa appeared normal. There were several linear erosions in distal esophagus. There was a large deep ulceration at the GE junction which show had friable edges and bled on contact of the scope. There was a mild fibrotic stricture with esophagitis. The size of the ulcer was at least 25 mm. Multiple biopsies were taken for the ages of the [facility], r there was no significant obstruction Stomach: Gastric mucosa was essentially normal in the body of the stomach. There was mild erythema in the gastric antrum pyloric outlet was normal. Flexion of the endoscope of the video to ulcerations at the GE junction Duodenum: Duodenal bulb and descending duodenum was normal[ The scope was then withdrawn from the patient and the procedure completed.  COMPLICATIONS: There were no complications. ENDOSCOPIC IMPRESSION: large deep ulcerations at the GE junction rule out malignancy.,status post multiple biopsies Grade 2 esophagitis Nonobstructing esophageal stricture RECOMMENDATIONS: 1.  Await  pathology results 2.  Prilosec 40 mg twice a day Biopsies have been sent [facility], depending on the pathology consider CT scan of the chest Proceed with colonoscopy  REPEAT EXAM: for EGD pending biopsy results.  eSigned:  Lafayette Dragon, MD 09/13/2013 10:42 AM   CC:  PATIENT NAME:  Rachel Walton, Rachel Walton MR#: [id number]

## 2013-09-13 NOTE — Patient Instructions (Addendum)
YOU HAD AN ENDOSCOPIC PROCEDURE TODAY AT THE [facility]: Refer to the procedure report that was given to you for any specific questions about what was found during the examination.  If the procedure report does not answer your questions, please call your gastroenterologist to clarify.  If you requested that your care partner not be given the details of your procedure findings, then the procedure report has been included in a sealed envelope for you to review at your convenience later.  YOU SHOULD EXPECT: Some feelings of bloating in the abdomen. Passage of more gas than usual.  Walking can help get rid of the air that was put into your GI tract during the procedure and reduce the bloating. If you had a lower endoscopy (such as a colonoscopy or flexible sigmoidoscopy) you may notice spotting of blood in your stool or on the toilet paper. If you underwent a bowel prep for your procedure, then you may not have a normal bowel movement for a few days.  DIET: Your first meal following the procedure should be a light meal and then it is ok to progress to your normal diet.  A half-sandwich or bowl of soup is an example of a good first meal.  Heavy or fried foods are harder to digest and may make you feel nauseous or bloated.  Likewise meals heavy in dairy and vegetables can cause extra gas to form and this can also increase the bloating.  Drink plenty of fluids but you should avoid alcoholic beverages for 24 hours.  ACTIVITY: Your care partner should take you home directly after the procedure.  You should plan to take it easy, moving slowly for the rest of the day.  You can resume normal activity the day after the procedure however you should NOT DRIVE or use heavy machinery for 24 hours (because of the sedation medicines used during the test).    SYMPTOMS TO REPORT IMMEDIATELY: A gastroenterologist can be reached at any hour.  During normal business hours, 8:30 AM to 5:00 PM Monday through Friday,  call [phone].  After hours and on weekends, please call the GI answering service at [phone] who will take a message and have the physician on call contact you.   Following lower endoscopy (colonoscopy or flexible sigmoidoscopy):  Excessive amounts of blood in the stool  Significant tenderness or worsening of abdominal pains  Swelling of the abdomen that is new, acute  Fever of 100F or higher  Following upper endoscopy (EGD)  Vomiting of blood or coffee ground material  New chest pain or pain under the shoulder blades  Painful or persistently difficult swallowing  New shortness of breath  Fever of 100F or higher  Black, tarry-looking stools  FOLLOW UP: If any biopsies were taken you will be contacted by phone or by letter within the next 1-3 weeks.  Call your gastroenterologist if you have not heard about the biopsies in 3 weeks.  Our staff will call the home number listed on your records the next business day following your procedure to check on you and address any questions or concerns that you may have at that time regarding the information given to you following your procedure. This is a courtesy call and so if there is no answer at the home number and we have not heard from you through the emergency physician on call, we will assume that you have returned to your regular daily activities without incident.  SIGNATURES/CONFIDENTIALITY: You and/or your care   partner have signed paperwork which will be entered into your electronic medical record.  These signatures attest to the fact that that the information above on your After Visit Summary has been reviewed and is understood.  Full responsibility of the confidentiality of this discharge information lies with you and/or your care-partner.  Esophagitis, stricture, diverticulosis, high fiber diet-handouts given  Prilosec 40 mg twice a day.  Wait pathology results.  No aspirin, aspirin products, or anti-inflammatory  medications (aleve, motrin, advil, naproxen, mobic, etc)  No recall on colonoscopy due to age.

## 2013-09-13 NOTE — Op Note (Addendum)
[place]  [address] [place], [id number]   COLONOSCOPY PROCEDURE REPORT  PATIENT: Rachel Walton, Rachel Walton  MR#: [id number] BIRTHDATE: 1929-10-15 , 83  yrs. old GENDER: Female ENDOSCOPIST: Lafayette Dragon, MD REFERRED PX:[id number] [name], M.D. PROCEDURE DATE:  09/13/2013 PROCEDURE:   Colonoscopy, screening First Screening Colonoscopy - Avg.  risk and is 50 yrs.  old or older - No.  Prior Negative Screening - Now for repeat screening. N/A  History of Adenoma - Now for follow-up colonoscopy & has been > or = to 3 yrs.  Yes hx of adenoma.  Has been 3 or more years since last colonoscopy.  Polyps Removed Today? No.  Recommend repeat exam, <10 yrs? Yes.  High risk (family or personal hx). ASA CLASS:   Class II INDICATIONS:Brother with colon cancer.  I do not this polyp removed in 2003.  Prior colonoscopies in 2000, 2003 and in 2009. MEDICATIONS: MAC sedation, administered by CRNA and Propofol (Diprivan) 80 mg IV  DESCRIPTION OF PROCEDURE:   After the risks benefits and alternatives of the procedure were thoroughly explained, informed consent was obtained.  A digital rectal exam revealed no abnormalities of the rectum.   The LB [id number]  endoscope was introduced through the anus and advanced to the cecum, which was identified by both the appendix and ileocecal valve. No adverse events experienced.   The quality of the prep was good, using MoviPrep  The instrument was then slowly withdrawn as the colon was fully examined.      COLON FINDINGS: Mild diverticulosis was noted in the sigmoid colon. Retroflexed views revealed no abnormalities. The time to cecum=9 minutes 43 seconds.  Withdrawal time=6 minutes 05 seconds.  The scope was withdrawn and the procedure completed. COMPLICATIONS: There were no complications.  ENDOSCOPIC IMPRESSION: Mild diverticulosis was noted in the sigmoid colon  RECOMMENDATIONS: high fiber diet No recall due to age   eSigned:  Lafayette Dragon, MD 09/13/2013 10:55 AM Revised: 09/13/2013 10:55 AM  cc:   PATIENT NAME:  Tinsley, Everman MR#: [id number]

## 2013-09-14 ENCOUNTER — Telehealth: Payer: Self-pay | Admitting: *Deleted

## 2013-09-14 ENCOUNTER — Encounter: Payer: Self-pay | Admitting: Internal Medicine

## 2013-09-14 ENCOUNTER — Telehealth: Payer: Self-pay

## 2013-09-14 DIAGNOSIS — K259 Gastric ulcer, unspecified as acute or chronic, without hemorrhage or perforation: Secondary | ICD-10-CM

## 2013-09-14 DIAGNOSIS — R1319 Other dysphagia: Secondary | ICD-10-CM

## 2013-09-14 NOTE — Telephone Encounter (Signed)
Spoke with Dr. Rosary Lively re: [facility] path. Results: Benign ulcer, positive for Barrett's, no cancer.

## 2013-09-14 NOTE — Telephone Encounter (Signed)
Line busy - will try again later

## 2013-09-14 NOTE — Addendum Note (Signed)
Addended by: Hulan Saas on: 09/14/2013 12:51 PM   Modules accepted: Orders

## 2013-09-14 NOTE — Telephone Encounter (Signed)
Spoke with patient and gave her pathology results as per letter on 09/14/13. Scheduled repeat EGD on 11/08/13 at 10:00 AM in [place]. Pre visit on 11/01/13 at 2:00 PM.

## 2013-09-14 NOTE — Telephone Encounter (Signed)
  Follow up Call-  Call back number 09/13/2013  Post procedure Call Back phone  # [phone]  Permission to leave phone message Yes     Patient questions:  Do you have a fever, pain , or abdominal swelling? no Pain Score  0 *  Have you tolerated food without any problems? yes  Have you been able to return to your normal activities? yes  Do you have any questions about your discharge instructions: Diet   no Medications  no Follow up visit  no  Do you have questions or concerns about your Care? no  Actions: * If pain score is 4 or above: No action needed, pain <4.  Per the pt she did okay yesterday.  She ate a little food and drank some juice and tolerated them both well.  I asked her to call Dr. Olevia Perches if she had any problems.  Pt said she would. maw

## 2013-09-14 NOTE — Telephone Encounter (Signed)
I just  Send the Path report letter- repeat EGD in 6 weeks., please schedule.

## 2013-09-18 ENCOUNTER — Encounter: Payer: Self-pay | Admitting: *Deleted

## 2013-09-20 ENCOUNTER — Encounter: Payer: Self-pay | Admitting: Internal Medicine

## 2013-10-25 ENCOUNTER — Encounter (INDEPENDENT_AMBULATORY_CARE_PROVIDER_SITE_OTHER): Payer: Self-pay | Admitting: Surgery

## 2013-11-01 ENCOUNTER — Ambulatory Visit (AMBULATORY_SURGERY_CENTER): Payer: Self-pay | Admitting: *Deleted

## 2013-11-01 VITALS — Ht 62.0 in | Wt 137.4 lb

## 2013-11-01 DIAGNOSIS — K2210 Ulcer of esophagus without bleeding: Secondary | ICD-10-CM

## 2013-11-01 NOTE — Progress Notes (Signed)
Patient denies any allergies to eggs or soy. Patient denies any problems with anesthesia/sedation. Patient denies any oxygen use at home and does not take any diet/weight loss medications.  

## 2013-11-02 ENCOUNTER — Telehealth (INDEPENDENT_AMBULATORY_CARE_PROVIDER_SITE_OTHER): Payer: Self-pay

## 2013-11-02 NOTE — Telephone Encounter (Signed)
Patient states she was advised to wait until her ucler has healed before she has gallbladder surgery per DR. Olevia Perches , she is scheduled for upper endo next week . Patient will call when she is ready to move forward with surgery

## 2013-11-08 ENCOUNTER — Ambulatory Visit (AMBULATORY_SURGERY_CENTER): Payer: Medicare Other | Admitting: Internal Medicine

## 2013-11-08 ENCOUNTER — Encounter: Payer: Self-pay | Admitting: Internal Medicine

## 2013-11-08 VITALS — BP 144/73 | HR 67 | Temp 96.4°F | Resp 23 | Ht 62.0 in | Wt 137.0 lb

## 2013-11-08 DIAGNOSIS — K2100 Gastro-esophageal reflux disease with esophagitis, without bleeding: Secondary | ICD-10-CM

## 2013-11-08 DIAGNOSIS — K222 Esophageal obstruction: Secondary | ICD-10-CM

## 2013-11-08 DIAGNOSIS — K2210 Ulcer of esophagus without bleeding: Secondary | ICD-10-CM

## 2013-11-08 MED ORDER — SODIUM CHLORIDE 0.9 % IV SOLN: 500.0000 mL | INTRAVENOUS | Status: DC

## 2013-11-08 NOTE — Progress Notes (Signed)
Patient denies any allergies to eggs or soy. Patient denies any problems with anesthesia/sedation. Patient denies any oxygen use at home and does not take any diet/weight loss medications.  

## 2013-11-08 NOTE — Progress Notes (Signed)
Called to room to assist during endoscopic procedure.  Patient ID and intended procedure confirmed with present staff. Received instructions for my participation in the procedure from the performing physician.  

## 2013-11-08 NOTE — Patient Instructions (Signed)
YOU HAD AN ENDOSCOPIC PROCEDURE TODAY AT [facility]: Refer to the procedure report that was given to you for any specific questions about what was found during the examination.  If the procedure report does not answer your questions, please call your gastroenterologist to clarify.  If you requested that your care partner not be given the details of your procedure findings, then the procedure report has been included in a sealed envelope for you to review at your convenience later.  YOU SHOULD EXPECT: Some feelings of bloating in the abdomen. Passage of more gas than usual.  Walking can help get rid of the air that was put into your GI tract during the procedure and reduce the bloating. If you had a lower endoscopy (such as a colonoscopy or flexible sigmoidoscopy) you may notice spotting of blood in your stool or on the toilet paper. If you underwent a bowel prep for your procedure, then you may not have a normal bowel movement for a few days.  DIET: You may have clear liquids until 10:30 AM, then you may go to a soft diet for the rest of the day.  Tomorrow you may have a regular diet.  Drink plenty of fluids but you should avoid alcoholic beverages for 24 hours.  ACTIVITY: Your care partner should take you home directly after the procedure.  You should plan to take it easy, moving slowly for the rest of the day.  You can resume normal activity the day after the procedure however you should NOT DRIVE or use heavy machinery for 24 hours (because of the sedation medicines used during the test).    SYMPTOMS TO REPORT IMMEDIATELY: A gastroenterologist can be reached at any hour.  During normal business hours, 8:30 AM to 5:00 PM Monday through Friday, call [phone].  After hours and on weekends, please call the GI answering service at [phone] who will take a message and have the physician on call contact you.   Following upper endoscopy (EGD)  Vomiting of blood or coffee ground  material  New chest pain or pain under the shoulder blades  Painful or persistently difficult swallowing  New shortness of breath  Fever of 100F or higher  Black, tarry-looking stools  FOLLOW UP: If any biopsies were taken you will be contacted by phone or by letter within the next 1-3 weeks.  Call your gastroenterologist if you have not heard about the biopsies in 3 weeks.  Our staff will call the home number listed on your records the next business day following your procedure to check on you and address any questions or concerns that you may have at that time regarding the information given to you following your procedure. This is a courtesy call and so if there is no answer at the home number and we have not heard from you through the emergency physician on call, we will assume that you have returned to your regular daily activities without incident.  SIGNATURES/CONFIDENTIALITY: You and/or your care partner have signed paperwork which will be entered into your electronic medical record.  These signatures attest to the fact that that the information above on your After Visit Summary has been reviewed and is understood.  Full responsibility of the confidentiality of this discharge information lies with you and/or your care-partner.  Read the handouts given to you by your recovery room nurse.  Your prilosec has been ordered for only ONCE PER DAY NOW per Dr. Olevia Perches.

## 2013-11-08 NOTE — Op Note (Signed)
[place]  [address]. [place], [address]   ENDOSCOPY PROCEDURE REPORT  PATIENT: Rachel, Walton  MR#: [id number] BIRTHDATE: January 22, 1930 , 83  yrs. old GENDER: Female ENDOSCOPIST: Lafayette Dragon, MD REFERRED BY:  Redge Gainer, M.D. PROCEDURE DATE:  11/08/2013 PROCEDURE:  EGD w/ biopsy ASA CLASS:     Class III INDICATIONS:  oral upper endoscopy from 09/13/2013 for large gastric ulcer at the GE junction.  Patient is asymptomatic.  He has occasional dysphagia. MEDICATIONS: propofol (Diprivan) 200mg  IV TOPICAL ANESTHETIC: none  DESCRIPTION OF PROCEDURE: After the risks benefits and alternatives of the procedure were thoroughly explained, informed consent was obtained.  The LB [id number] endoscope was introduced through the mouth and advanced to the second portion of the duodenum. Without limitations.  The instrument was slowly withdrawn as the mucosa was fully examined.      [Esophagus: proximal and mid-esophageal mucosa was normal. There was a mild nonobstructing esophageal stricture which allowed the endoscope to traverse into the stomach. The previously present large gastric ulcer at the GE junction was no longer present. It showed 95% healing. A scar tissue was present at the GE junction from per from the ulcer healing. Multiple biopsies were obtained from this area to rule out dysplasia area but there was a small 2 cm hiatal hernia Stomach: the gastric folds were normal. There was mild erythema throughout the stomach. Gastric antrum and pyloric outlet were normal. Retroflexion of the endoscope revealed normal fundus and cardia Duodenum: duodenal bulb and descending duodenum was normal Savary dilator was passed over the guidewire using a 16 mm dilator which passed through the stricture without much resistance. There was no blood on the dilator         The scope was then withdrawn from the patient and the procedure completed.  COMPLICATIONS:  There were no complications. ENDOSCOPIC IMPRESSION:  95% healing off a the large ulcer at GE junction. Remaining  scar tissue at the site of the ulcer has been biopsied Mild nonobstructing distal esophageal stricture dilated to 16 mm Small 2 cm hiatal hernia RECOMMENDATIONS: 1.  Await pathology results 2.  Anti-reflux regimen to be follow 3.  Continue current medications  REPEAT EXAM: for EGD pending biopsy results.  eSigned:  Lafayette Dragon, MD 11/08/2013 10:35 AM   CC:  PATIENT NAME:  Rachel, Walton MR#: [id number]

## 2013-11-08 NOTE — Progress Notes (Signed)
A/ox3, pleased with MAC, report to RN 

## 2013-11-09 ENCOUNTER — Telehealth: Payer: Self-pay | Admitting: *Deleted

## 2013-11-09 NOTE — Telephone Encounter (Signed)
  Follow up Call-  Call back number 11/08/2013 09/13/2013  Post procedure Call Back phone  # [phone]  Permission to leave phone message Yes Yes     Patient questions:  Do you have a fever, pain , or abdominal swelling? No. Pain Score 0  Have you tolerated food without any problems? Yes.    Have you been able to return to your normal activities?yes  Do you have any questions about your discharge instructions: Diet   No. Medications  No. Follow up visit  No.  Do you have questions or concerns about your Care? No.  Actions: * If pain score is 4 or above: No action needed, pain <4.

## 2013-11-16 ENCOUNTER — Encounter: Payer: Self-pay | Admitting: Internal Medicine

## 2013-11-22 ENCOUNTER — Encounter: Payer: Self-pay | Admitting: *Deleted

## 2013-11-23 ENCOUNTER — Telehealth: Payer: Self-pay | Admitting: Internal Medicine

## 2013-11-23 NOTE — Telephone Encounter (Signed)
Spoke with pt and reviewed path results and letter with pt.

## 2013-12-06 ENCOUNTER — Telehealth: Payer: Self-pay | Admitting: Family Medicine

## 2013-12-06 NOTE — Telephone Encounter (Signed)
appt moved up due to high BP

## 2013-12-07 DIAGNOSIS — H53001 Unspecified amblyopia, right eye: Secondary | ICD-10-CM | POA: Insufficient documentation

## 2013-12-11 ENCOUNTER — Ambulatory Visit (INDEPENDENT_AMBULATORY_CARE_PROVIDER_SITE_OTHER): Payer: Medicare Other | Admitting: Family Medicine

## 2013-12-11 ENCOUNTER — Encounter: Payer: Self-pay | Admitting: Family Medicine

## 2013-12-11 VITALS — BP 175/75 | HR 68 | Temp 97.6°F | Ht 62.0 in | Wt 136.0 lb

## 2013-12-11 DIAGNOSIS — K222 Esophageal obstruction: Secondary | ICD-10-CM | POA: Insufficient documentation

## 2013-12-11 DIAGNOSIS — J301 Allergic rhinitis due to pollen: Secondary | ICD-10-CM

## 2013-12-11 DIAGNOSIS — J302 Other seasonal allergic rhinitis: Secondary | ICD-10-CM | POA: Insufficient documentation

## 2013-12-11 DIAGNOSIS — M810 Age-related osteoporosis without current pathological fracture: Secondary | ICD-10-CM

## 2013-12-11 DIAGNOSIS — I10 Essential (primary) hypertension: Secondary | ICD-10-CM | POA: Insufficient documentation

## 2013-12-11 DIAGNOSIS — E559 Vitamin D deficiency, unspecified: Secondary | ICD-10-CM

## 2013-12-11 DIAGNOSIS — E785 Hyperlipidemia, unspecified: Secondary | ICD-10-CM

## 2013-12-11 LAB — POCT CBC
GRANULOCYTE PERCENT: 57 % (ref 37–80)
HCT, POC: 42.5 % (ref 37.7–47.9)
Hemoglobin: 13.5 g/dL (ref 12.2–16.2)
LYMPH, POC: 2.3 (ref 0.6–3.4)
MCH, POC: 28.2 pg (ref 27–31.2)
MCHC: 31.8 g/dL (ref 31.8–35.4)
MCV: 88.5 fL (ref 80–97)
MPV: 8.6 fL (ref 0–99.8)
PLATELET COUNT, POC: 221 10*3/uL (ref 142–424)
POC Granulocyte: 3.1 (ref 2–6.9)
POC LYMPH PERCENT: 42 %L (ref 10–50)
RBC: 4.8 M/uL (ref 4.04–5.48)
RDW, POC: 13.1 %
WBC: 5.4 10*3/uL (ref 4.6–10.2)

## 2013-12-11 MED ORDER — CLONAZEPAM 0.5 MG PO TABS: 0.5000 mg | ORAL_TABLET | Freq: Two times a day (BID) | ORAL | Status: DC | PRN

## 2013-12-11 MED ORDER — LISINOPRIL 5 MG PO TABS: 5.0000 mg | ORAL_TABLET | Freq: Every day | ORAL | Status: DC

## 2013-12-11 NOTE — Patient Instructions (Addendum)
Medicare Annual Wellness Visit  [facility] and the medical providers at [facility] strive to bring you the best medical care.  In doing so we not only want to address your current medical conditions and concerns but also to detect new conditions early and prevent illness, disease and health-related problems.    Medicare offers a yearly Wellness Visit which allows our clinical staff to assess your need for preventative services including immunizations, lifestyle education, counseling to decrease risk of preventable diseases and screening for fall risk and other medical concerns.    This visit is provided free of charge (no copay) for all Medicare recipients. The clinical pharmacists at [facility] have begun to conduct these Wellness Visits which will also include a thorough review of all your medications.    As you primary medical provider recommend that you make an appointment for your Annual Wellness Visit if you have not done so already this year.  You may set up this appointment before you leave today or you may call back ([phone]) and schedule an appointment.  Please make sure when you call that you mention that you are scheduling your Annual Wellness Visit with the clinical pharmacist so that the appointment may be made for the proper length of time.     Continue current medications. Continue good therapeutic lifestyle changes which include good diet and exercise. Fall precautions discussed with patient. If an FOBT was given today- please return it to our front desk. If you are over 71 years old - you may need Prevnar 66 or the adult Pneumonia vaccine.  Take BP medication for your blood pressure as directed Return blood pressure readings for review in a couple of weeks and get a BMP We will call you with the results of the lab work his sinuses are well sore available  You'll also be scheduled for a DEXA scan.

## 2013-12-11 NOTE — Progress Notes (Signed)
Subjective:    Patient ID: Rachel Walton, female    DOB: June 16, 1929, 80 y.o.   MRN: [id number]  HPI Pt here for follow up and management of chronic medical problems. This patient has a history of anxiety, hyperlipidemia, and allergic rhinitis. Health maintenance was reviewed and she is due to get lab work today and will be scheduled for a DEXA scan. Her clonazepam will be refill. The patient has been to see the ophthalmologist recently and she was found to have a retinal hemorrhage. According to the patient I think this could be do to her blood pressure running too high. She's also had an endoscopy and a colonoscopy. The endoscopy revealed an esophageal ulcer and stricture and on a PPI. She is doing better with this. She had a followup endoscopy in this showed healing of the ulcer in the esophagus. The patient checks her blood pressures at home and she brought in some readings which will be scanned into the record. The readings at home are better than the ones in the office. She indicates that at home the right arm seems to run higher than the left arm. Repeat blood pressures will be done today. She does complain today of seasonal allergies and she complains of her blood pressure.        Patient Active Problem List   Diagnosis Date Noted  . Generalized anxiety disorder 08/21/2013  . Gall stones 06/15/2013  . Hyperlipemia 11/21/2012  . Tremor   . Postmenopausal   . Colon polyp   . Osteoporosis   . Kyphosis   . Recurrent UTI   . Atrophic vaginitis   . Diverticulosis   . Meningitis, history of    Outpatient Encounter Prescriptions as of 12/11/2013  Medication Sig  . Cholecalciferol (VITAMIN D3) 2000 UNITS TABS Take 1 tablet by mouth 2 (two) times a week. As needed  . clonazePAM (KLONOPIN) 0.5 MG tablet Take 1 tablet (0.5 mg total) by mouth 2 (two) times daily as needed for anxiety.  . fish oil-omega-3 fatty acids 1000 MG capsule Take 2 g by mouth daily.  . Multiple Vitamins-Minerals  (ICAPS AREDS FORMULA PO) Take 1 tablet by mouth 2 (two) times daily.  . Multiple Vitamins-Minerals (STRESS TAB NF PO) Take 1 tablet by mouth daily.  . naphazoline-pheniramine (NAPHCON-A) 0.025-0.3 % ophthalmic solution 3 (three) times a week.  Marland Kitchen omeprazole (PRILOSEC) 40 MG capsule Take 40 mg by mouth daily.  . polyvinyl alcohol-povidone (REFRESH) 1.4-0.6 % ophthalmic solution Place 1-2 drops into both eyes 2 (two) times daily.   . rosuvastatin (CRESTOR) 5 MG tablet Take 2.5 mg by mouth 3 (three) times a week.   . sodium chloride (OCEAN) 0.65 % SOLN nasal spray Place 1 spray into both nostrils as needed for congestion.  . Triamcinolone Acetonide (NASACORT ALLERGY 24HR NA) Place into the nose.    Review of Systems  Constitutional: Negative.   HENT: Negative.        Seasonal allergies  Eyes: Negative.   Respiratory: Negative.   Cardiovascular: Negative.   Gastrointestinal: Negative.   Endocrine: Negative.   Genitourinary: Negative.   Musculoskeletal: Negative.   Skin: Negative.   Allergic/Immunologic: Negative.   Neurological: Negative.   Hematological: Negative.   Psychiatric/Behavioral: Negative.        Objective:   Physical Exam  Nursing note and vitals reviewed. Constitutional: She is oriented to person, place, and time. She appears well-developed and well-nourished. No distress.  HENT:  Head: Normocephalic and atraumatic.  Right Ear:  External ear normal.  Left Ear: External ear normal.  Mouth/Throat: Oropharynx is clear and moist.  Nasal congestion bilaterally  Eyes: Conjunctivae and EOM are normal. Pupils are equal, round, and reactive to light. Right eye exhibits no discharge. Left eye exhibits no discharge. No scleral icterus.  Neck: Normal range of motion. Neck supple. No thyromegaly present.   No carotid bruits  Cardiovascular: Normal rate, regular rhythm, normal heart sounds and intact distal pulses.  Exam reveals no gallop and no friction rub.   No murmur  heard. At 72 per minute with a regular rate and rhythm  Pulmonary/Chest: Effort normal and breath sounds normal. No respiratory distress. She has no wheezes. She has no rales. She exhibits no tenderness.  Abdominal: Soft. Bowel sounds are normal. She exhibits no mass. There is no tenderness. There is no rebound and no guarding.  The abdomen was nontender today without organ enlargement  Musculoskeletal: Normal range of motion. She exhibits no edema and no tenderness.  Lymphadenopathy:    She has no cervical adenopathy.  Neurological: She is alert and oriented to person, place, and time. She has normal reflexes. No cranial nerve deficit.  The patient continues to have a persistent head tremor. There are no tremors in either of her arms.  Skin: Skin is warm and dry. No rash noted.  Psychiatric: She has a normal mood and affect. Her behavior is normal. Judgment and thought content normal.   BP 175/75  Pulse 68  Temp(Src) 97.6 F (36.4 C) (Oral)  Ht 5' 2" (1.575 m)  Wt 136 lb (61.689 kg)  BMI 24.87 kg/m2        Assessment & Plan:  1. Hyperlipemia - POCT CBC - BMP8+EGFR - Hepatic function panel - NMR, lipoprofile  2. Osteoporosis - POCT CBC - Vit D  25 hydroxy (rtn osteoporosis monitoring)  3. Vitamin D deficiency - Vit D  25 hydroxy (rtn osteoporosis monitoring)  4. Essential hypertension - BMP8+EGFR; Future -Monitor blood pressures closely and bring these by for review and a couple of weeks and get a repeat BMP  5. Allergic rhinitis due to pollen -Continue nasal steroid  6. Esophageal stricture -Continue proton pump inhibitor  Patient Instructions                       Medicare Annual Wellness Visit  [facility] and the medical providers at [facility] strive to bring you the best medical care.  In doing so we not only want to address your current medical conditions and concerns but also to detect new conditions early and prevent illness,  disease and health-related problems.    Medicare offers a yearly Wellness Visit which allows our clinical staff to assess your need for preventative services including immunizations, lifestyle education, counseling to decrease risk of preventable diseases and screening for fall risk and other medical concerns.    This visit is provided free of charge (no copay) for all Medicare recipients. The clinical pharmacists at [facility] have begun to conduct these Wellness Visits which will also include a thorough review of all your medications.    As you primary medical provider recommend that you make an appointment for your Annual Wellness Visit if you have not done so already this year.  You may set up this appointment before you leave today or you may call back ([phone]) and schedule an appointment.  Please make sure when you call that you mention that you are  scheduling your Annual Wellness Visit with the clinical pharmacist so that the appointment may be made for the proper length of time.     Continue current medications. Continue good therapeutic lifestyle changes which include good diet and exercise. Fall precautions discussed with patient. If an FOBT was given today- please return it to our front desk. If you are over 35 years old - you may need Prevnar 68 or the adult Pneumonia vaccine.  Take BP medication for your blood pressure as directed Return blood pressure readings for review in a couple of weeks and get a BMP We will call you with the results of the lab work his sinuses are well sore available  You'll also be scheduled for a DEXA scan.    Arrie Senate MD

## 2013-12-12 LAB — HEPATIC FUNCTION PANEL
ALBUMIN: 4 g/dL (ref 3.5–4.7)
ALT: 36 IU/L — ABNORMAL HIGH (ref 0–32)
AST: 29 IU/L (ref 0–40)
Alkaline Phosphatase: 73 IU/L (ref 39–117)
BILIRUBIN TOTAL: 0.3 mg/dL (ref 0.0–1.2)
Bilirubin, Direct: 0.09 mg/dL (ref 0.00–0.40)
TOTAL PROTEIN: 6.1 g/dL (ref 6.0–8.5)

## 2013-12-12 LAB — NMR, LIPOPROFILE
CHOLESTEROL: 271 mg/dL — AB (ref 100–199)
HDL Cholesterol by NMR: 87 mg/dL (ref >39)
HDL Particle Number: 41.1 umol/L (ref >=30.5)
LDL Particle Number: 1455 nmol/L — ABNORMAL HIGH (ref <1000)
LDL Size: 22 nm (ref >20.5)
LDLC SERPL CALC-MCNC: 147 mg/dL — ABNORMAL HIGH (ref 0–99)
LP-IR Score: 32 (ref <=45)
Triglycerides by NMR: 186 mg/dL — ABNORMAL HIGH (ref 0–149)

## 2013-12-12 LAB — BMP8+EGFR
BUN / CREAT RATIO: 6 — AB (ref 11–26)
BUN: 5 mg/dL — AB (ref 8–27)
CO2: 27 mmol/L (ref 18–29)
CREATININE: 0.84 mg/dL (ref 0.57–1.00)
Calcium: 9.3 mg/dL (ref 8.7–10.3)
Chloride: 102 mmol/L (ref 97–108)
GFR calc Af Amer: 75 mL/min/{1.73_m2} (ref >59)
GFR calc non Af Amer: 65 mL/min/{1.73_m2} (ref >59)
GLUCOSE: 105 mg/dL — AB (ref 65–99)
Potassium: 4.4 mmol/L (ref 3.5–5.2)
Sodium: 143 mmol/L (ref 134–144)

## 2013-12-12 LAB — VITAMIN D 25 HYDROXY (VIT D DEFICIENCY, FRACTURES): Vit D, 25-Hydroxy: 32.6 ng/mL (ref 30.0–100.0)

## 2013-12-25 ENCOUNTER — Other Ambulatory Visit: Payer: Medicare Other

## 2013-12-25 ENCOUNTER — Ambulatory Visit: Payer: Medicare Other | Admitting: Family Medicine

## 2013-12-25 DIAGNOSIS — I10 Essential (primary) hypertension: Secondary | ICD-10-CM

## 2013-12-25 NOTE — Progress Notes (Signed)
Pt came too ealry

## 2013-12-27 ENCOUNTER — Encounter (INDEPENDENT_AMBULATORY_CARE_PROVIDER_SITE_OTHER): Payer: Self-pay | Admitting: Surgery

## 2013-12-27 ENCOUNTER — Ambulatory Visit (INDEPENDENT_AMBULATORY_CARE_PROVIDER_SITE_OTHER): Payer: Medicare Other | Admitting: Surgery

## 2013-12-27 VITALS — BP 128/70 | HR 74 | Temp 98.0°F | Resp 18 | Ht 63.0 in | Wt 133.0 lb

## 2013-12-27 DIAGNOSIS — K8020 Calculus of gallbladder without cholecystitis without obstruction: Secondary | ICD-10-CM

## 2013-12-27 NOTE — Progress Notes (Addendum)
Re:   Rachel Walton DOB:   August 02, 1929 MRN:   [id number]  ASSESSMENT AND PLAN: 1.  Gall stones  I discussed with the patient the indications and risks of gall bladder surgery.  The primary risks of gall bladder surgery include, but are not limited to, bleeding, infection, common bile duct injury, and open surgery.  There is also the risk that the patient may have continued symptoms after surgery.  Her symptoms are not entirely typical, in that the pain is lower abdomen, but she does have certain food intolerance. We discussed the typical post-operative recovery course. I tried to answer the patient's questions.  I gave the patient literature about gall bladder surgery (again).  I also gave her copies of her [place].  Plan: She is now asymptomatic.  We talked about gallstones in asymptomatic patients and at this time, there is no immediate indication for surgery.  She is very concerned because of what happened to her sister with gall bladder surgery and her complicaitons.  For now, we will follow her.  She wants to see my back in 6 months.  2.  Ulcer at GE junction with esophageal stricture on endo by Dr. Olevia Perches  Her last endo was 11/08/2013 with 95% healing of ulcer.  The patients symptoms are better 3.  Tremor  Exacerbated by general anesthesia 4.  Intermittent solid food dysphagia 5.  Diverticulosis 6.  Anxiety 7.  Macular degenertion, mild 8.  HTN - she was just started on meds by Dr. [name].  Chief Complaint  Patient presents with  . Routine Post Op    upper ENDo results   REFERRING PHYSICIAN: Redge Gainer, MD  HISTORY OF PRESENT ILLNESS: Rachel Walton is a 80 y.o. (DOB: 29-Jun-1929)  white  female whose primary care physician is Redge Gainer, MD and comes to me today for gall stones. Husband with patient (he is a little hard of hearing). She had her last endo on 11/08/2013 by Dr. Olevia Perches.  She saw Dr. [name] on 12/11/2013. She is doing better than when I last saw her.  She is basically  having no upper abdominal symptoms.  She is on omeprazole for her esophageal ulcer.  History of GI symptoms (06/2013): The patient started having symptoms in about October, 2014, with bad lower mid abdominal pain. She has to go to the bathroom for BM with urgency. She does not tolerate spicy or fried foods and she has limited chocolate intake.  She has no prior upper endo.  No liver disease.  She had her appendix removed at age 25.  She has had a hysterectomy.  Her last colonoscopy was in 2008.  She is scheduled for upper endo/colonoscopy on 07/26/2013. She knew Dr. Sheilah Pigeon -  He did her husband's hernia surgery. She's had multiple family members with gallstones -sisters and mother.  One sister had bile duct issues post op and was in hospital x 21 days in [place].  I think this complication weighs on [name] mind.  [place] - 05/02/2013 - Hepatic steatosis.  Mobile gall stones. CT scan - 06/06/2013 - hepatic steatosis, calcified gall stones in contracted gall bladder, extra right renal pelvis, diverticulosis   Past Medical History  Diagnosis Date  . Tremor   . Postmenopausal   . Hx of adenomatous colonic polyps   . Osteoporosis   . Kyphosis   . Recurrent UTI   . Atrophic vaginitis   . Diverticulosis   . Meningitis   . Cataract   .  Hyperlipidemia   . Diverticulosis       Past Surgical History  Procedure Laterality Date  . Total abdominal hysterectomy w/ bilateral salpingoophorectomy    . Back surgery    . Appendectomy    . Cataract surgery    . Abdominal hysterectomy    . Breast surgery       Current Outpatient Prescriptions  Medication Sig Dispense Refill  . Cholecalciferol (VITAMIN D3) 2000 UNITS TABS Take 1 tablet by mouth 2 (two) times a week. As needed      . clonazePAM (KLONOPIN) 0.5 MG tablet Take 1 tablet (0.5 mg total) by mouth 2 (two) times daily as needed for anxiety.  60 tablet  3  . fish oil-omega-3 fatty acids 1000 MG capsule Take 2 g by mouth daily.      Marland Kitchen  lisinopril (PRINIVIL,ZESTRIL) 5 MG tablet Take 1 tablet (5 mg total) by mouth daily.  30 tablet  3  . Multiple Vitamins-Minerals (ICAPS AREDS FORMULA PO) Take 1 tablet by mouth 2 (two) times daily.      . Multiple Vitamins-Minerals (STRESS TAB NF PO) Take 1 tablet by mouth daily.      . naphazoline-pheniramine (NAPHCON-A) 0.025-0.3 % ophthalmic solution 3 (three) times a week.      Marland Kitchen omeprazole (PRILOSEC) 40 MG capsule Take 40 mg by mouth daily.      . polyvinyl alcohol-povidone (REFRESH) 1.4-0.6 % ophthalmic solution Place 1-2 drops into both eyes 2 (two) times daily.       . rosuvastatin (CRESTOR) 5 MG tablet Take 2.5 mg by mouth 3 (three) times a week.       . sodium chloride (OCEAN) 0.65 % SOLN nasal spray Place 1 spray into both nostrils as needed for congestion.      . Triamcinolone Acetonide (NASACORT ALLERGY 24HR NA) Place into the nose.       No current facility-administered medications for this visit.      Allergies  Allergen Reactions  . Alendronate Sodium Other (See Comments)    Caused chest pain  . Cephalexin   . Prednisone Other (See Comments)    Reaction is unknown..then states nervousness  . Sudafed [Pseudoephedrine Hcl] Other (See Comments)    insomnia  . Sulfa Antibiotics Hives  . Toprol Xl [Metoprolol Succinate] Other (See Comments)    Unknown reaction  . Trimethoprim     Unknown reaction    REVIEW OF SYSTEMS: Skin:  No history of rash.  No history of abnormal moles. Infection:  No history of hepatitis or HIV.  No history of MRSA. Neurologic:  Tremor. She blames the tremor on prior general anesthesia.  Has seen Dr. Donato Schultz, in [place].  History of viral meningitis - 2005 - hospitalized at [facility]. Cardiac:  History of "leakage" heart valve.  Not seeing a cardiologist at this time. Pulmonary:  Had pneumonia treated as an outpatient about 2010.  Endocrine:  No diabetes. No thyroid disease. Gastrointestinal:  See HPI.  Plans for upper endo/colonoscopy by Dr.  Olevia Perches - 07/26/2013 Urologic:  History of recurrent bladder infections.  Sees Dr. Jeffie Pollock. GYN:  History of hysterectomy. Musculoskeletal:  Back surgery by Dr. Sherwood Gambler - 1994. Hematologic:  No bleeding disorder.  No history of anemia.  Not anticoagulated. Psycho-social:  The patient is oriented.   The patient has no obvious psychologic or social impairment to understanding our conversation and plan.  SOCIAL and FAMILY HISTORY: Married.  Husband is with her. She has 3 sons:  2 live local, one  lives in [place]. Her husband had hernia surgery by Dr. Sheilah Pigeon.  PHYSICAL EXAM: BP 128/70  Pulse 74  Temp(Src) 98 F (36.7 C)  Resp 18  Ht 5\' 3"  (1.6 m)  Wt 133 lb (60.328 kg)  BMI 23.57 kg/m2  General: WN older WF who is alert and generally healthy appearing. She has a noticeable tremor. HEENT: Normal. Pupils equal. Neck: Supple. No mass.  No thyroid mass. Lungs: Clear to auscultation and symmetric breath sounds. Heart:  RRR. No murmur or rub. Abdomen: Soft. No mass. No tenderness. No hernia. Normal bowel sounds.  Lower midline scar and RLQ scar.  DATA REVIEWED: Epic notes and Dr. Nichola Sizer note.  Alphonsa Overall, MD,  [facility], [address]  [place], [place]    [place] Phone:  [phone] FAX:  [phone]

## 2014-01-05 ENCOUNTER — Other Ambulatory Visit: Payer: Self-pay | Admitting: Family Medicine

## 2014-01-05 ENCOUNTER — Telehealth: Payer: Self-pay | Admitting: Internal Medicine

## 2014-01-05 NOTE — Telephone Encounter (Signed)
I have spoken to patient to advise that she continue omeprazole as prescribed due to her history of esophageal stricture and gastric ulcer. She states that she has started having a bit of dysphagia occasionally and asks if she can go back on twice daily dosing. I advised that she may increase to twice daily and if symptoms do not resolve within the next couple of weeks, she is to call back. She verbalizes understanding.

## 2014-01-08 NOTE — Telephone Encounter (Signed)
Looks like this was already done on 12/11/13, did she get printed Rx?

## 2014-01-10 ENCOUNTER — Ambulatory Visit: Payer: Medicare Other | Admitting: Family Medicine

## 2014-01-25 ENCOUNTER — Ambulatory Visit (INDEPENDENT_AMBULATORY_CARE_PROVIDER_SITE_OTHER): Payer: Medicare Other | Admitting: Family Medicine

## 2014-01-25 VITALS — BP 157/65 | HR 123 | Temp 97.1°F | Ht 62.0 in | Wt 134.6 lb

## 2014-01-25 DIAGNOSIS — H9209 Otalgia, unspecified ear: Secondary | ICD-10-CM

## 2014-01-25 DIAGNOSIS — E785 Hyperlipidemia, unspecified: Secondary | ICD-10-CM

## 2014-01-25 DIAGNOSIS — H9203 Otalgia, bilateral: Secondary | ICD-10-CM

## 2014-01-25 DIAGNOSIS — H6980 Other specified disorders of Eustachian tube, unspecified ear: Secondary | ICD-10-CM

## 2014-01-25 MED ORDER — ACETIC ACID 2 % OT SOLN: 4.0000 [drp] | Freq: Three times a day (TID) | OTIC | Status: DC

## 2014-01-25 MED ORDER — AMOXICILLIN 500 MG PO CAPS: 500.0000 mg | ORAL_CAPSULE | Freq: Three times a day (TID) | ORAL | Status: DC

## 2014-01-25 NOTE — Progress Notes (Signed)
   Subjective:    Patient ID: Rachel Walton, female    DOB: Dec 05, 1929, 80 y.o.   MRN: [id number]  HPI This 80 y.o. female presents for evaluation of bilateral ear discomfort.  She would like to get her LFT done today.  She was recently seen by Dr. [name] and her AST was mildly elevated.  She is intolerant to a lot of medicine and she cannot take certain ear gtt's.  She is wanting ear gtt's.     Review of Systemsc/o bilateral ear discomfort No chest pain, SOB, HA, dizziness, vision change, N/V, diarrhea, constipation, dysuria, urinary urgency or frequency, myalgias, arthralgias or rash.     Objective:   Physical Exam  Vital signs noted  Well developed well nourished female.  HEENT - Head atraumatic Normocephalic                Eyes - PERRLA, Conjuctiva - clear Sclera- Clear EOMI                Ears - EAC's Wnl TM's Wnl Gross Hearing WNL                Nose - Nares patent                 Throat - oropharanx wnl Respiratory - Lungs CTA bilateral Cardiac - RRR S1 and S2 without murmur GI - Abdomen soft Nontender and bowel sounds active x 4 Extremities - No edema. Neuro - Grossly intact.      Assessment & Plan:  Otalgia of both ears - Plan: amoxicillin (AMOXIL) 500 MG capsule, acetic acid (VOSOL) 2 % otic solution  ETD (eustachian tube dysfunction), unspecified laterality - Plan: amoxicillin (AMOXIL) 500 MG capsule, acetic acid (VOSOL) 2 % otic solution  Lysbeth Penner FNP

## 2014-01-25 NOTE — Addendum Note (Signed)
Addended by: Zannie Cove on: 01/25/2014 11:12 AM   Modules accepted: Orders

## 2014-01-26 ENCOUNTER — Other Ambulatory Visit: Payer: Self-pay

## 2014-01-26 DIAGNOSIS — Z1231 Encounter for screening mammogram for malignant neoplasm of breast: Secondary | ICD-10-CM

## 2014-01-26 DIAGNOSIS — Z803 Family history of malignant neoplasm of breast: Secondary | ICD-10-CM

## 2014-01-26 LAB — HEPATIC FUNCTION PANEL
ALT: 27 IU/L (ref 0–32)
AST: 26 IU/L (ref 0–40)
Albumin: 3.9 g/dL (ref 3.5–4.7)
Alkaline Phosphatase: 70 IU/L (ref 39–117)
BILIRUBIN TOTAL: 0.4 mg/dL (ref 0.0–1.2)
Bilirubin, Direct: 0.13 mg/dL (ref 0.00–0.40)
Total Protein: 6.3 g/dL (ref 6.0–8.5)

## 2014-01-30 ENCOUNTER — Telehealth: Payer: Self-pay | Admitting: Family Medicine

## 2014-01-30 DIAGNOSIS — M542 Cervicalgia: Secondary | ICD-10-CM

## 2014-01-31 NOTE — Telephone Encounter (Signed)
Chronic neck pain. Had physical therapy years ago and it helped.

## 2014-02-14 ENCOUNTER — Ambulatory Visit
Admission: RE | Admit: 2014-02-14 | Discharge: 2014-02-14 | Disposition: A | Discharge status: Home / Self Care | Payer: Medicare Other | Source: Ambulatory Visit

## 2014-02-14 DIAGNOSIS — Z803 Family history of malignant neoplasm of breast: Secondary | ICD-10-CM

## 2014-02-14 DIAGNOSIS — Z1231 Encounter for screening mammogram for malignant neoplasm of breast: Secondary | ICD-10-CM

## 2014-02-28 ENCOUNTER — Telehealth: Payer: Self-pay | Admitting: Family Medicine

## 2014-02-28 ENCOUNTER — Encounter: Payer: Self-pay | Admitting: Family Medicine

## 2014-02-28 ENCOUNTER — Ambulatory Visit (INDEPENDENT_AMBULATORY_CARE_PROVIDER_SITE_OTHER): Payer: Medicare Other | Admitting: Family Medicine

## 2014-02-28 VITALS — BP 144/74 | HR 73 | Temp 98.6°F | Ht 62.0 in | Wt 137.0 lb

## 2014-02-28 DIAGNOSIS — J0100 Acute maxillary sinusitis, unspecified: Secondary | ICD-10-CM

## 2014-02-28 DIAGNOSIS — M26629 Arthralgia of temporomandibular joint, unspecified side: Secondary | ICD-10-CM

## 2014-02-28 DIAGNOSIS — J301 Allergic rhinitis due to pollen: Secondary | ICD-10-CM

## 2014-02-28 DIAGNOSIS — M2662 Arthralgia of temporomandibular joint: Secondary | ICD-10-CM

## 2014-02-28 MED ORDER — AZELASTINE HCL 0.1 % NA SOLN: NASAL | Status: DC

## 2014-02-28 NOTE — Progress Notes (Signed)
Subjective:    Patient ID: Rachel Walton, female    DOB: May 15, 1929, 80 y.o.   MRN: [id number]  HPI Patient here today for jaw pain, tooth and ear pain on right side and sinus pressure. She has seen a dentist and had xray of sinus, whom also started her on Augmentin.       Patient Active Problem List   Diagnosis Date Noted  . Esophageal stricture 12/11/2013  . Allergic rhinitis due to pollen 12/11/2013  . Essential hypertension 12/11/2013  . Generalized anxiety disorder 08/21/2013  . Gall stones 06/15/2013  . Hyperlipemia 11/21/2012  . Tremor   . Postmenopausal   . Colon polyp   . Osteoporosis   . Kyphosis   . Recurrent UTI   . Atrophic vaginitis   . Diverticulosis   . Meningitis, history of    Outpatient Encounter Prescriptions as of 02/28/2014  Medication Sig  . acetic acid (VOSOL) 2 % otic solution Place 4 drops into both ears 3 (three) times daily.  Marland Kitchen amoxicillin-clavulanate (AUGMENTIN) 500-125 MG per tablet Take 1 tablet by mouth every 12 (twelve) hours.  . Cholecalciferol (VITAMIN D3) 2000 UNITS TABS Take 1 tablet by mouth 2 (two) times a week. As needed  . clonazePAM (KLONOPIN) 0.5 MG tablet TAKE ONE TABLET TWICE DAILY AS NEEDED  . fish oil-omega-3 fatty acids 1000 MG capsule Take 2 g by mouth daily.  Marland Kitchen lisinopril (PRINIVIL,ZESTRIL) 5 MG tablet Take 1 tablet (5 mg total) by mouth daily.  . Multiple Vitamins-Minerals (ICAPS AREDS FORMULA PO) Take 1 tablet by mouth 2 (two) times daily.  . Multiple Vitamins-Minerals (STRESS TAB NF PO) Take 1 tablet by mouth daily.  . naphazoline-pheniramine (NAPHCON-A) 0.025-0.3 % ophthalmic solution 3 (three) times a week.  Marland Kitchen omeprazole (PRILOSEC) 40 MG capsule Take 40 mg by mouth daily.  . polyvinyl alcohol-povidone (REFRESH) 1.4-0.6 % ophthalmic solution Place 1-2 drops into both eyes 2 (two) times daily.   . rosuvastatin (CRESTOR) 5 MG tablet Take 2.5 mg by mouth 3 (three) times a week.   . sodium chloride (OCEAN) 0.65 % SOLN  nasal spray Place 1 spray into both nostrils as needed for congestion.  . Triamcinolone Acetonide (NASACORT ALLERGY 24HR NA) Place into the nose.  . [DISCONTINUED] amoxicillin (AMOXIL) 500 MG capsule Take 1 capsule (500 mg total) by mouth 3 (three) times daily.    Review of Systems  Constitutional: Negative.   HENT: Positive for ear pain (right side) and sinus pressure.        Jaw pain right side  Eyes: Negative.   Respiratory: Negative.   Cardiovascular: Negative.   Gastrointestinal: Negative.   Endocrine: Negative.   Genitourinary: Negative.   Musculoskeletal: Negative.   Skin: Negative.   Allergic/Immunologic: Negative.   Neurological: Negative.   Hematological: Negative.   Psychiatric/Behavioral: Negative.        Objective:   Physical Exam  Nursing note and vitals reviewed. Constitutional: She is oriented to person, place, and time. She appears well-developed and well-nourished. No distress.  HENT:  Head: Normocephalic and atraumatic.  Right Ear: External ear normal.  Left Ear: External ear normal.  Mouth/Throat: Oropharynx is clear and moist. No oropharyngeal exudate.  Nasal congestion bilaterally, tenderness in the right TM J. Minimal ethmoid and right maxillary sinus  Eyes: Conjunctivae and EOM are normal. Pupils are equal, round, and reactive to light. Right eye exhibits no discharge. Left eye exhibits no discharge. No scleral icterus.  Neck: Normal range of motion. Neck supple. No  thyromegaly present.  No adenopathy or carotid bruits  Cardiovascular: Normal rate, regular rhythm, normal heart sounds and intact distal pulses.   No murmur heard. Pulmonary/Chest: Effort normal and breath sounds normal. No respiratory distress. She has no wheezes. She has no rales. She exhibits no tenderness.  Musculoskeletal: Normal range of motion.  Lymphadenopathy:    She has no cervical adenopathy.  Neurological: She is alert and oriented to person, place, and time.  The patient  continues with a tremor of her head.  Skin: Skin is warm and dry. No rash noted.  Psychiatric: She has a normal mood and affect. Her behavior is normal. Judgment and thought content normal.  The patient is alert and relates her history well.   BP 144/74  Pulse 73  Temp(Src) 98.6 F (37 C) (Oral)  Ht 5\' 2"  (1.575 m)  Wt 137 lb (62.143 kg)  BMI 25.05 kg/m2        Assessment & Plan:   1. Acute maxillary sinusitis, recurrence not specified - azelastine (ASTELIN) 0.1 % nasal spray; 1-2 sprays each nostril nightly  Dispense: 30 mL; Refill: 12  2. Allergic rhinitis due to pollen - azelastine (ASTELIN) 0.1 % nasal spray; 1-2 sprays each nostril nightly  Dispense: 30 mL; Refill: 12  3. TMJ arthralgia  Patient Instructions  The patient should continue with Augmentin until this antibiotic is completed.  She should also continue with Nasacort and saline nose spray. Take Tylenol for aches pains and fever and drink plenty of fluids  Please call on Tuesday with the progress on the discomfort in the right jaw--we will arrange some additional x-rays if you're not better Use additional nose spray as directed              Arrie Senate MD

## 2014-02-28 NOTE — Telephone Encounter (Signed)
Appointment given for 3:30 today with [name]

## 2014-02-28 NOTE — Patient Instructions (Addendum)
The patient should continue with Augmentin until this antibiotic is completed.  She should also continue with Nasacort and saline nose spray. Take Tylenol for aches pains and fever and drink plenty of fluids  Please call on Tuesday with the progress on the discomfort in the right jaw--we will arrange some additional x-rays if you're not better Use additional nose spray as directed

## 2014-03-06 ENCOUNTER — Telehealth: Payer: Self-pay | Admitting: Family Medicine

## 2014-03-06 DIAGNOSIS — R519 Headache, unspecified: Secondary | ICD-10-CM

## 2014-03-06 DIAGNOSIS — R51 Headache: Principal | ICD-10-CM

## 2014-03-06 DIAGNOSIS — J3489 Other specified disorders of nose and nasal sinuses: Secondary | ICD-10-CM

## 2014-03-06 DIAGNOSIS — H9202 Otalgia, left ear: Secondary | ICD-10-CM

## 2014-03-06 DIAGNOSIS — H9201 Otalgia, right ear: Secondary | ICD-10-CM

## 2014-03-06 NOTE — Telephone Encounter (Signed)
This is for a CT scan of her TMJ and sinus

## 2014-03-06 NOTE — Telephone Encounter (Signed)
Right jaw / ear area no better - wants the ct scan of sinus and tmj = ordered

## 2014-03-06 NOTE — Telephone Encounter (Signed)
Said that DR. Moore told her to call you today about an appointment for her jaw pain she said she needs an appointment today please call her back.

## 2014-03-09 ENCOUNTER — Other Ambulatory Visit: Payer: Self-pay | Admitting: Family Medicine

## 2014-03-17 IMAGING — CT CT ABD-PELV W/ CM
2 of 5 series · 16 of 46 positions shown, 18 images · IV contrast (Omnipaque 300)
Comparison: US ABDOMEN COMPLETE dated 05/02/2013

CLINICAL DATA: Low abdominal pain since [DATE] worse with eating;
appendectomy; hysterectomy; lumbar surg; hx basal cell carcinoma on
face

EXAM:
CT ABDOMEN AND PELVIS WITH CONTRAST
TECHNIQUE: Multidetector CT imaging of the abdomen and pelvis was performed
using the standard protocol following bolus administration of
intravenous contrast.
CONTRAST:  100mL OMNIPAQUE IOHEXOL 300 MG/ML  SOLN

[Series 2: abd_pel_with 5.0 b40f · axial · 0.69mm/px · z∈[-462,-77]mm · 13 of 87 slices shown, 15 images]
[im 5/87  soft-tissue]
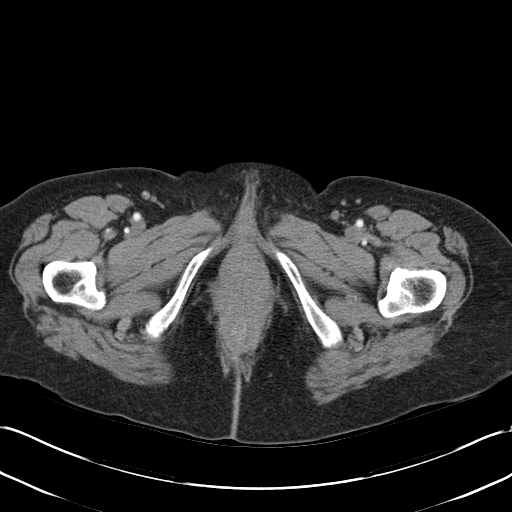
[im 5/87  bone]
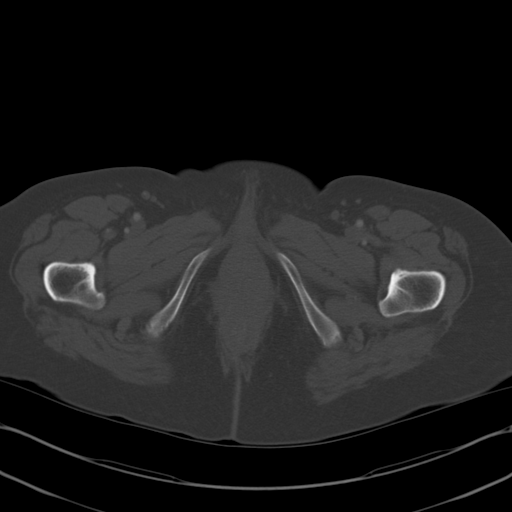
[im 14/87  soft-tissue]
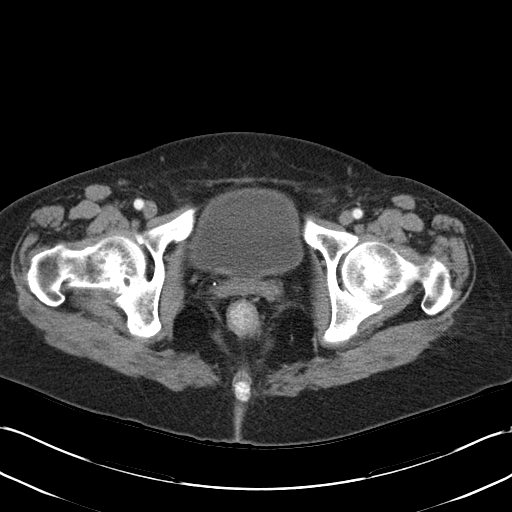
[im 19/87  soft-tissue]
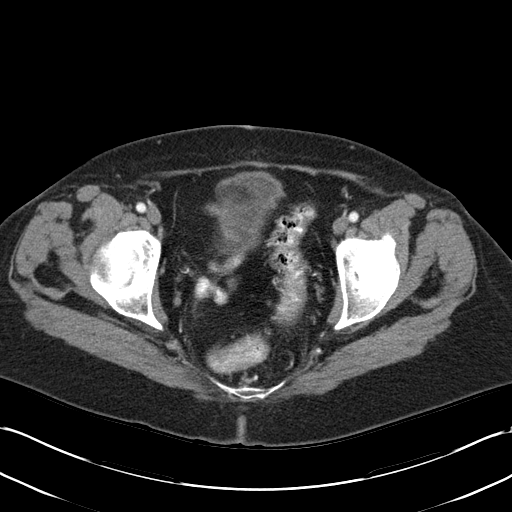
[im 23/87  soft-tissue]
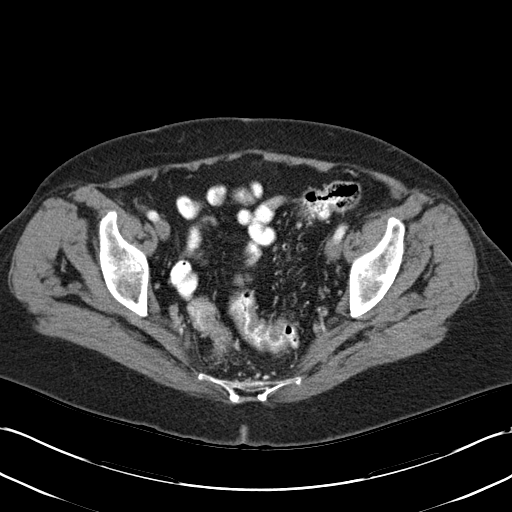
[im 32/87  soft-tissue]
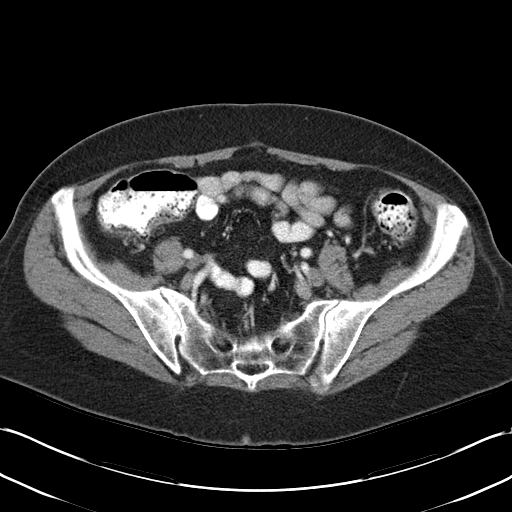
[im 37/87  soft-tissue]
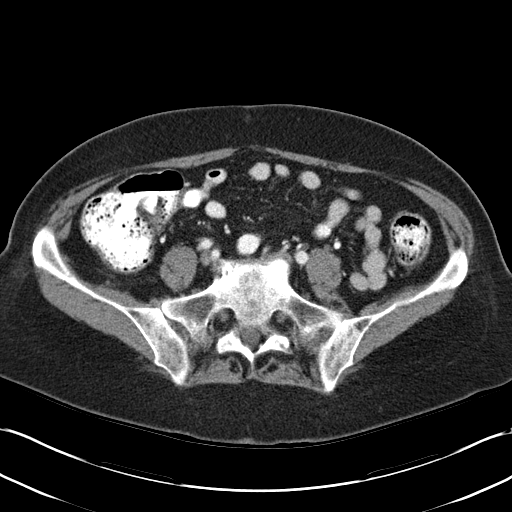
[im 46/87  soft-tissue]
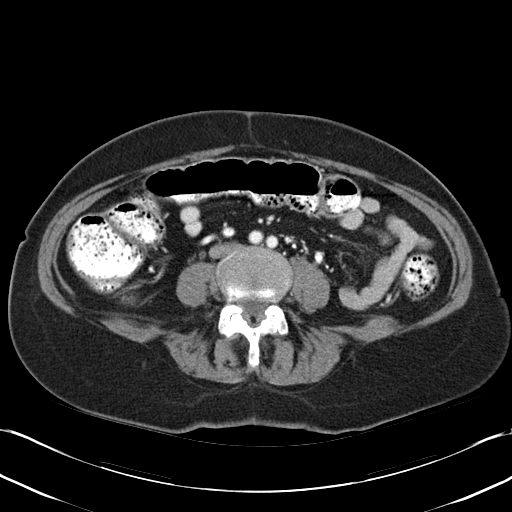
[im 50/87  soft-tissue]
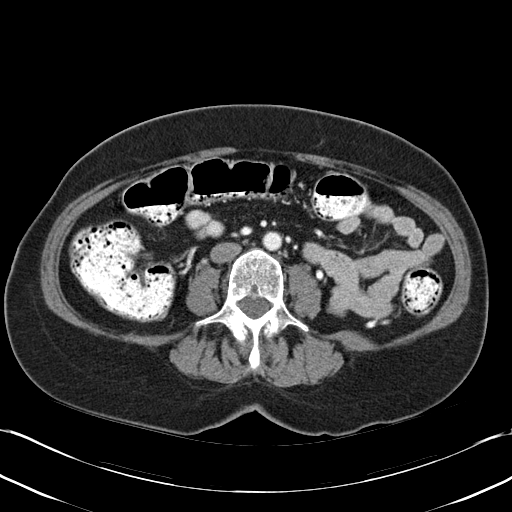
[im 55/87  soft-tissue]
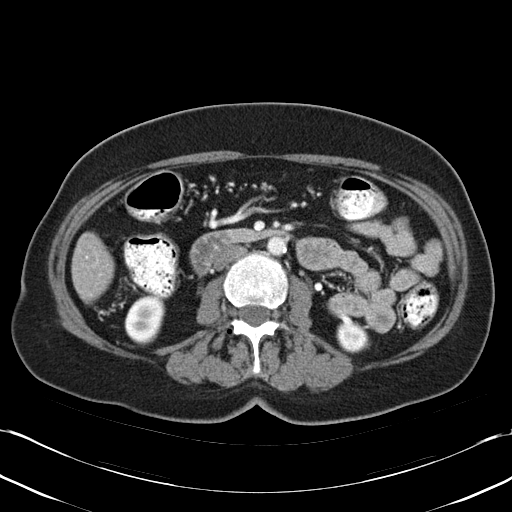
[im 55/87  bone]
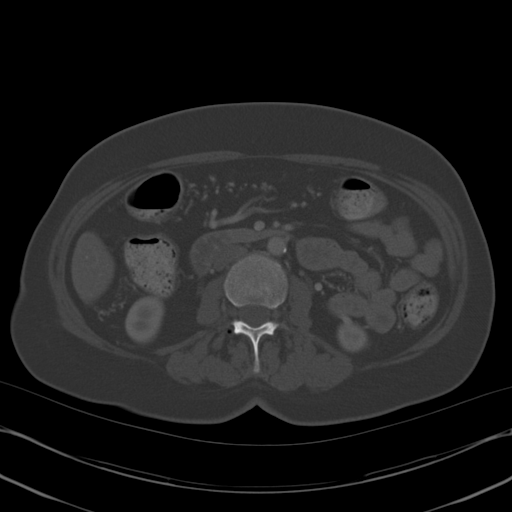
[im 64/87  soft-tissue]
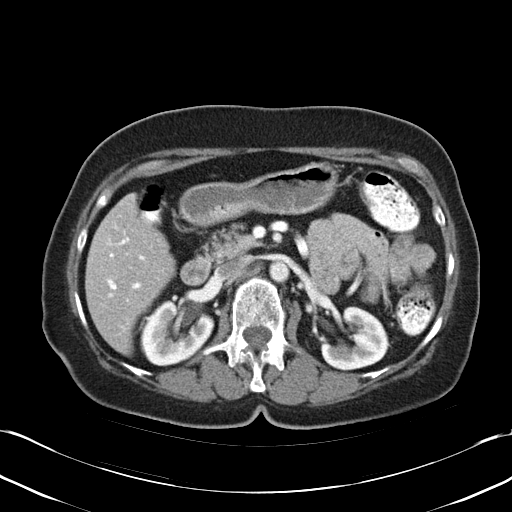
[im 68/87  soft-tissue]
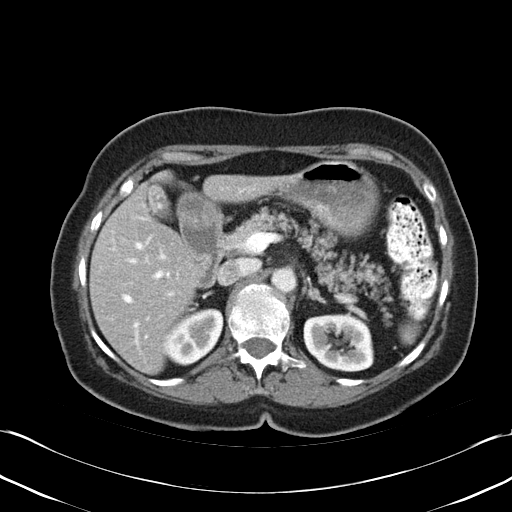
[im 73/87  soft-tissue]
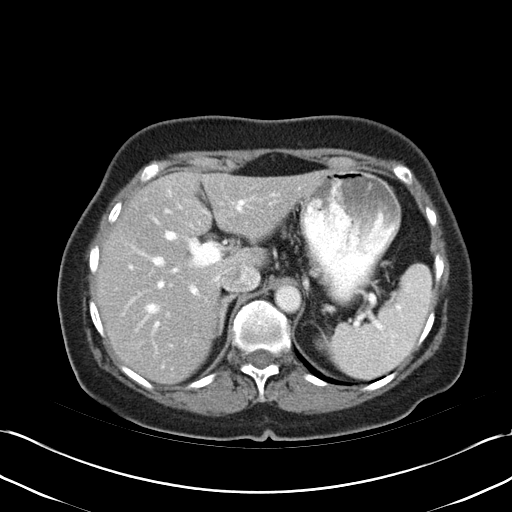
[im 82/87  soft-tissue]
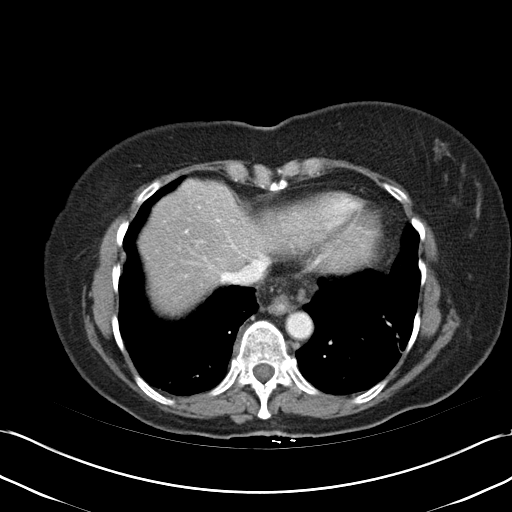

[Series 4: abd_pel_with 3.0 spo · coronal · 0.70mm/px · 3 of 73 slices shown]
[im 25/73  soft-tissue]
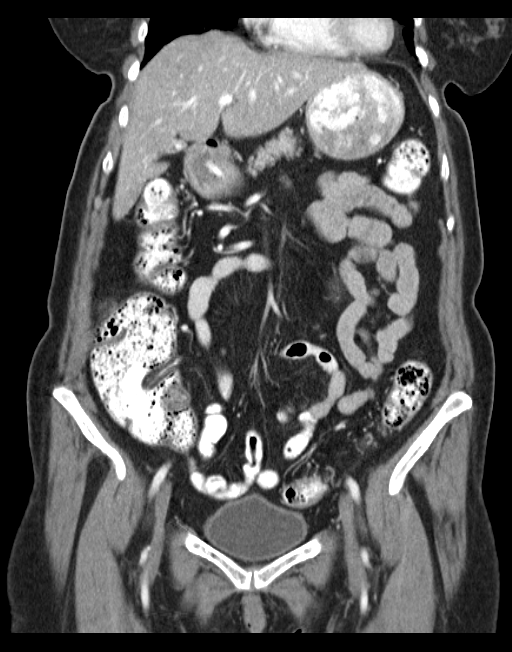
[im 33/73  soft-tissue]
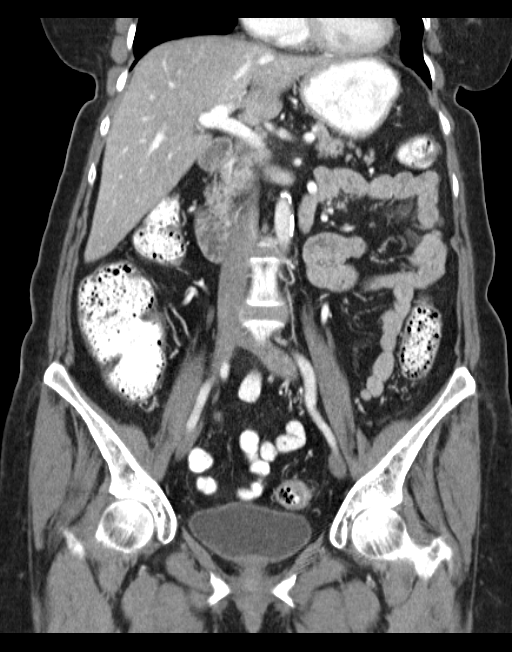
[im 41/73  soft-tissue]
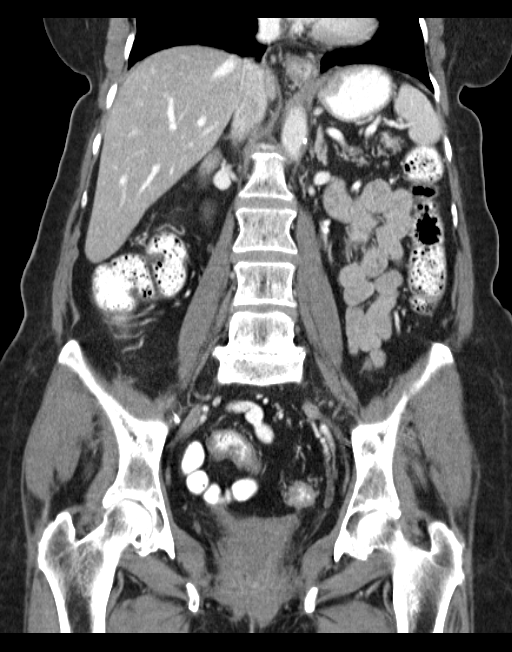

[16 of 46 positions shown; findings below may reference images not displayed]

FINDINGS: The lung bases demonstrate areas of intralobular and subpleural
septal thickening as well as mild hypoventilation. No focal regions
consolidation appreciated.

The liver demonstrates a diffuse low attenuating architecture is
otherwise unremarkable. Calcified gallstones are appreciated within
a partially contracted gallbladder. The spleen, adrenals, pancreas,
and left kidney are unremarkable. A moderate to large extrarenal
pelvis is identified within the right kidney is otherwise
unremarkable.

There is no evidence of bowel obstruction, enteritis, colitis, nor
diverticulitis. The patient is status post appendectomy.

There is no evidence of abdominal aortic aneurysm. Atherosclerotic
calcifications are appreciated within the abdominal aorta. The
celiac, SMA, IMA, portal vein, SMV are opacified.

There is diverticulosis within the sigmoid colon. There is no
evidence of an abdominal wall nor inguinal hernia.
IMPRESSION: 1. No CT evidence of obstructive or inflammatory abnormalities, nor
CT findings representing in the etiology of the patient's abdominal
pain.
2. Hepatic steatosis
3. Calcified gallstones within a contracted gallbladder
4. Extra renal pelvis involving the right kidney
5. Diverticulosis without evidence diverticulitis
6. Interstitial fibrotic changes within the lung bases.

## 2014-03-19 ENCOUNTER — Ambulatory Visit (HOSPITAL_COMMUNITY)
Admission: RE | Admit: 2014-03-19 | Discharge: 2014-03-19 | Disposition: A | Discharge status: Home / Self Care | Payer: Medicare Other | Source: Ambulatory Visit | Attending: Family Medicine | Admitting: Family Medicine

## 2014-03-19 DIAGNOSIS — R51 Headache: Secondary | ICD-10-CM | POA: Insufficient documentation

## 2014-03-19 DIAGNOSIS — M2662 Arthralgia of temporomandibular joint: Secondary | ICD-10-CM | POA: Diagnosis not present

## 2014-03-19 DIAGNOSIS — R519 Headache, unspecified: Secondary | ICD-10-CM

## 2014-03-19 DIAGNOSIS — H9201 Otalgia, right ear: Secondary | ICD-10-CM

## 2014-03-19 DIAGNOSIS — J3489 Other specified disorders of nose and nasal sinuses: Secondary | ICD-10-CM

## 2014-03-19 DIAGNOSIS — H9202 Otalgia, left ear: Secondary | ICD-10-CM | POA: Diagnosis not present

## 2014-03-20 ENCOUNTER — Telehealth: Payer: Self-pay

## 2014-03-20 DIAGNOSIS — M26629 Arthralgia of temporomandibular joint, unspecified side: Secondary | ICD-10-CM

## 2014-03-20 NOTE — Telephone Encounter (Signed)
Pt aware of CT results and request referral to Dr. Cresenciano Lick for ENT

## 2014-03-28 ENCOUNTER — Other Ambulatory Visit: Payer: Self-pay | Admitting: *Deleted

## 2014-03-28 ENCOUNTER — Ambulatory Visit (INDEPENDENT_AMBULATORY_CARE_PROVIDER_SITE_OTHER): Payer: Medicare Other | Admitting: Pharmacist

## 2014-03-28 ENCOUNTER — Encounter: Payer: Self-pay | Admitting: Pharmacist

## 2014-03-28 ENCOUNTER — Ambulatory Visit (INDEPENDENT_AMBULATORY_CARE_PROVIDER_SITE_OTHER): Payer: Medicare Other

## 2014-03-28 VITALS — BP 145/80 | HR 78 | Ht 62.0 in | Wt 133.5 lb

## 2014-03-28 DIAGNOSIS — R51 Headache: Secondary | ICD-10-CM

## 2014-03-28 DIAGNOSIS — Z0000 Encounter for general adult medical examination without abnormal findings: Secondary | ICD-10-CM

## 2014-03-28 DIAGNOSIS — M8580 Other specified disorders of bone density and structure, unspecified site: Secondary | ICD-10-CM

## 2014-03-28 DIAGNOSIS — S0300XA Dislocation of jaw, unspecified side, initial encounter: Secondary | ICD-10-CM

## 2014-03-28 DIAGNOSIS — Z780 Asymptomatic menopausal state: Secondary | ICD-10-CM

## 2014-03-28 DIAGNOSIS — E785 Hyperlipidemia, unspecified: Secondary | ICD-10-CM

## 2014-03-28 DIAGNOSIS — R519 Headache, unspecified: Secondary | ICD-10-CM

## 2014-03-28 LAB — HM DEXA SCAN

## 2014-03-28 NOTE — Patient Instructions (Signed)
Health Maintenance Summary     INFLUENZA VACCINE Postponed 04/10/2014  Will wait until finished with antibiotics    MAMMOGRAM Next Due 02/15/2015  Last done 02/14/2014    TETANUS/TDAP Next Due 05/11/2016 Last done 05/11/2006    COLONOSCOPY Next Due 09/14/2023 Last done 09/13/2013   Dexa / bone density Next Due 03/28/2016 Last done 03/28/2014   Zostavax / shingles vaccine Completed     Pneumonia Vaccine Completed     Eye Exam Next Due 2016            Fall Prevention and Home Safety Falls cause injuries and can affect all age groups. It is possible to use preventive measures to significantly decrease the likelihood of falls. There are many simple measures which can make your home safer and prevent falls. OUTDOORS  Repair cracks and edges of walkways and driveways.  Remove high doorway thresholds.  Trim shrubbery on the main path into your home.  Have good outside lighting.  Clear walkways of tools, rocks, debris, and clutter.  Check that handrails are not broken and are securely fastened. Both sides of steps should have handrails.  Have leaves, snow, and ice cleared regularly.  Use sand or salt on walkways during winter months.  In the garage, clean up grease or oil spills. BATHROOM  Install night lights.  Install grab bars by the toilet and in the tub and shower.  Use non-skid mats or decals in the tub or shower.  Place a plastic non-slip stool in the shower to sit on, if needed.  Keep floors dry and clean up all water on the floor immediately.  Remove soap buildup in the tub or shower on a regular basis.  Secure bath mats with non-slip, double-sided rug tape.  Remove throw rugs and tripping hazards from the floors. BEDROOMS  Install night lights.  Make sure a bedside light is easy to reach.  Do not use oversized bedding.  Keep a telephone by your bedside.  Have a firm chair with side arms to use for getting dressed.  Remove throw rugs and tripping hazards from  the floor. KITCHEN  Keep handles on pots and pans turned toward the center of the stove. Use back burners when possible.  Clean up spills quickly and allow time for drying.  Avoid walking on wet floors.  Avoid hot utensils and knives.  Position shelves so they are not too high or low.  Place commonly used objects within easy reach.  If necessary, use a sturdy step stool with a grab bar when reaching.  Keep electrical cables out of the way.  Do not use floor polish or wax that makes floors slippery. If you must use wax, use non-skid floor wax.  Remove throw rugs and tripping hazards from the floor. STAIRWAYS  Never leave objects on stairs.  Place handrails on both sides of stairways and use them. Fix any loose handrails. Make sure handrails on both sides of the stairways are as long as the stairs.  Check carpeting to make sure it is firmly attached along stairs. Make repairs to worn or loose carpet promptly.  Avoid placing throw rugs at the top or bottom of stairways, or properly secure the rug with carpet tape to prevent slippage. Get rid of throw rugs, if possible.  Have an electrician put in a light switch at the top and bottom of the stairs. OTHER FALL PREVENTION TIPS  Wear low-heel or rubber-soled shoes that are supportive and fit well. Wear closed toe shoes.  When using a stepladder, make sure it is fully opened and both spreaders are firmly locked. Do not climb a closed stepladder.  Add color or contrast paint or tape to grab bars and handrails in your home. Place contrasting color strips on first and last steps.  Learn and use mobility aids as needed. Install an electrical emergency response system.  Turn on lights to avoid dark areas. Replace light bulbs that burn out immediately. Get light switches that glow.  Arrange furniture to create clear pathways. Keep furniture in the same place.  Firmly attach carpet with non-skid or double-sided tape.  Eliminate  uneven floor surfaces.  Select a carpet pattern that does not visually hide the edge of steps.  Be aware of all pets. OTHER HOME SAFETY TIPS  Set the water temperature for 120 F (48.8 C).  Keep emergency numbers on or near the telephone.  Keep smoke detectors on every level of the home and near sleeping areas. Document Released: 04/17/2002 Document Revised: 10/27/2011 Document Reviewed: 07/17/2011 [facility] Patient Information 2015 [place], [place]. This information is not intended to replace advice given to you by your health care provider. Make sure you discuss any questions you have with your health care provider.                Exercise for Strong Bones  Exercise is important to build and maintain strong bones / bone density.  There are 2 types of exercises that are important to building and maintaining strong bones:  Weight- bearing and muscle-stregthening.  Weight-bearing Exercises  These exercises include activities that make you move against gravity while staying upright. Weight-bearing exercises can be high-impact or low-impact.  High-impact weight-bearing exercises help build bones and keep them strong. If you have broken a bone due to osteoporosis or are at risk of breaking a bone, you may need to avoid high-impact exercises. If you're not sure, you should check with your healthcare provider.  Examples of high-impact weight-bearing exercises are: Dancing  Doing high-impact aerobics  Hiking  Jogging/running  Jumping Rope  Stair climbing  Tennis  Low-impact weight-bearing exercises can also help keep bones strong and are a safe alternative if you cannot do high-impact exercises.   Examples of low-impact weight-bearing exercises are: Using elliptical training machines  Doing low-impact aerobics  Using stair-step machines  Fast walking on a treadmill or outside   Muscle-Strengthening Exercises These exercises include activities where you move your body, a weight or  some other resistance against gravity. They are also known as resistance exercises and include: Lifting weights  Using elastic exercise bands  Using weight machines  Lifting your own body weight  Functional movements, such as standing and rising up on your toes  Yoga and Pilates can also improve strength, balance and flexibility. However, certain positions may not be safe for people with osteoporosis or those at increased risk of broken bones. For example, exercises that have you bend forward may increase the chance of breaking a bone in the spine.   Non-Impact Exercises There are other types of exercises that can help prevent falls.  Non-impact exercises can help you to improve balance, posture and how well you move in everyday activities. Some of these exercises include: Balance exercises that strengthen your legs and test your balance, such as Tai Chi, can decrease your risk of falls.  Posture exercises that improve your posture and reduce rounded or "sloping" shoulders can help you decrease the chance of breaking a bone, especially in the spine.  Functional exercises that improve how well you move can help you with everyday activities and decrease your chance of falling and breaking a bone. For example, if you have trouble getting up from a chair or climbing stairs, you should do these activities as exercises.   **A physical therapist can teach you balance, posture and functional exercises. He/she can also help you learn which exercises are safe and appropriate for you.  [facility] has a physical therapy office in [place] in front of our office and referrals can be made for assessments and treatment as needed and strength and balance training.  If you would like to have an assessment with [facility] and our physical therapy team please let a nurse or provider know.

## 2014-03-28 NOTE — Progress Notes (Signed)
Patient ID: Rachel Walton, female   DOB: [birth year], 80 y.o.   MRN: [id number]  Subjective:    Rachel Walton is a 80 y.o. female who presents for Medicare Initial preventive examination and Review DEXA / osteopenia.  Preventive Screening-Counseling & Management  Tobacco History  Smoking status  . Never Smoker   Smokeless tobacco  . Never Used     Current Problems (verified) Patient Active Problem List   Diagnosis Date Noted  . Esophageal stricture 12/11/2013  . Allergic rhinitis due to pollen 12/11/2013  . Essential hypertension 12/11/2013  . Generalized anxiety disorder 08/21/2013  . Gall stones 06/15/2013  . Hyperlipemia 11/21/2012  . Tremor   . Postmenopausal   . Colon polyp   . Osteopenia of the elderly   . Kyphosis   . Recurrent UTI   . Atrophic vaginitis   . Diverticulosis   . Meningitis, history of     Medications Prior to Visit Current Outpatient Prescriptions on File Prior to Visit  Medication Sig Dispense Refill  . amoxicillin-clavulanate (AUGMENTIN) 500-125 MG per tablet Take 1 tablet by mouth every 12 (twelve) hours.    . Cholecalciferol (VITAMIN D3) 2000 UNITS TABS Take 1 tablet by mouth 2 (two) times a week. As needed    . clonazePAM (KLONOPIN) 0.5 MG tablet TAKE ONE TABLET TWICE DAILY AS NEEDED 60 tablet 2  . lisinopril (PRINIVIL,ZESTRIL) 5 MG tablet TAKE ONE (1) TABLET EACH DAY 30 tablet 5  . Multiple Vitamins-Minerals (ICAPS AREDS FORMULA PO) Take 1 tablet by mouth 2 (two) times daily.    . Multiple Vitamins-Minerals (STRESS TAB NF PO) Take 1 tablet by mouth daily.    . naphazoline-pheniramine (NAPHCON-A) 0.025-0.3 % ophthalmic solution 3 (three) times a week.    Marland Kitchen omeprazole (PRILOSEC) 40 MG capsule Take 40 mg by mouth daily.    . polyvinyl alcohol-povidone (REFRESH) 1.4-0.6 % ophthalmic solution Place 1-2 drops into both eyes 2 (two) times daily.     . rosuvastatin (CRESTOR) 5 MG tablet Take 2.5 mg by mouth 3 (three) times a week.     . sodium  chloride (OCEAN) 0.65 % SOLN nasal spray Place 1 spray into both nostrils as needed for congestion.    Marland Kitchen azelastine (ASTELIN) 0.1 % nasal spray 1-2 sprays each nostril nightly 30 mL 12  . fish oil-omega-3 fatty acids 1000 MG capsule Take 2 g by mouth daily.    . Triamcinolone Acetonide (NASACORT ALLERGY 24HR NA) Place into the nose.     No current facility-administered medications on file prior to visit.    Current Medications (verified) Current Outpatient Prescriptions  Medication Sig Dispense Refill  . amoxicillin-clavulanate (AUGMENTIN) 500-125 MG per tablet Take 1 tablet by mouth every 12 (twelve) hours.    . Cholecalciferol (VITAMIN D3) 2000 UNITS TABS Take 1 tablet by mouth 2 (two) times a week. As needed    . clonazePAM (KLONOPIN) 0.5 MG tablet TAKE ONE TABLET TWICE DAILY AS NEEDED 60 tablet 2  . lisinopril (PRINIVIL,ZESTRIL) 5 MG tablet TAKE ONE (1) TABLET EACH DAY 30 tablet 5  . Multiple Vitamins-Minerals (ICAPS AREDS FORMULA PO) Take 1 tablet by mouth 2 (two) times daily.    . Multiple Vitamins-Minerals (STRESS TAB NF PO) Take 1 tablet by mouth daily.    . naphazoline-pheniramine (NAPHCON-A) 0.025-0.3 % ophthalmic solution 3 (three) times a week.    Marland Kitchen omeprazole (PRILOSEC) 40 MG capsule Take 40 mg by mouth daily.    . polyvinyl alcohol-povidone (REFRESH) 1.4-0.6 % ophthalmic  solution Place 1-2 drops into both eyes 2 (two) times daily.     . rosuvastatin (CRESTOR) 5 MG tablet Take 2.5 mg by mouth 3 (three) times a week.     . sodium chloride (OCEAN) 0.65 % SOLN nasal spray Place 1 spray into both nostrils as needed for congestion.    Marland Kitchen azelastine (ASTELIN) 0.1 % nasal spray 1-2 sprays each nostril nightly 30 mL 12  . fish oil-omega-3 fatty acids 1000 MG capsule Take 2 g by mouth daily.    . Triamcinolone Acetonide (NASACORT ALLERGY 24HR NA) Place into the nose.     No current facility-administered medications for this visit.     Allergies (verified) Alendronate sodium;  Cephalexin; Prednisone; Sudafed; Sulfa antibiotics; Toprol xl; and Trimethoprim   PAST HISTORY  Family History Family History  Problem Relation Age of Onset  . Colon cancer      nephew/sibling  . Stroke Mother   . Heart disease Father     MI  . Cancer Sister     Brain tumor  . Hypertension Sister   . Stroke Sister   . Cancer Brother     Colon cancer  . Heart disease Brother   . Diabetes Son   . Diabetes Maternal Uncle   . Diabetes Son   . Hypertension Sister   . Cancer Sister     breast  . Hypertension Sister   . Hypertension Sister   . Hypertension Sister   . Hypertension Sister   . Hypertension Sister     Social History History  Substance Use Topics  . Smoking status: Never Smoker   . Smokeless tobacco: Never Used  . Alcohol Use: No     Are there smokers in your home (other than you)? No  Risk Factors Current exercise habits: Exercises with physical therapist 1-3 times per week - stationary bicycle Dietary issues discussed: limiting fat and CHO in diet due to elevated LDL and triglycerides  Cardiac risk factors: advanced age (older than 53 for men, 50 for women), dyslipidemia, family history of premature cardiovascular disease and hypertension.  Depression Screen (Note: if answer to either of the following is "Yes", a more complete depression screening is indicated)   Over the past 2 weeks, have you felt down, depressed or hopeless? Yes - a few days  Over the past 2 weeks, have you felt little interest or pleasure in doing things? No - stays active  Have you lost interest or pleasure in daily life? No  Do you often feel hopeless? No  Do you cry easily over simple problems? No  Activities of Daily Living In your present state of health, do you have any difficulty performing the following activities?:  Driving? No Managing money?  No Feeding yourself? No Getting from bed to chair? No  Climbing a flight of stairs? No Preparing food and eating?:  No Bathing or showering? No Getting dressed: No Getting to the toilet? No Using the toilet:No Moving around from place to place: No In the past year have you fallen or had a near fall?:No   Are you sexually active?  No  Do you have more than one partner?  No  Hearing Difficulties: Yes - currently having sinus congestion and ear fullness - has appt to ENT in next 1-2 weeks. Do you often ask people to speak up or repeat themselves? No Do you experience ringing or noises in your ears? No Do you have difficulty understanding soft or whispered voices? No  Do you feel that you have a problem with memory? No  Do you often misplace items? No  Do you feel safe at home?  Yes  Cognitive Testing  Alert? Yes  Normal Appearance?Yes  Oriented to person? Yes  Place? Yes   Time? Yes  Recall of three objects?  Yes  Can perform simple calculations? Yes  Displays appropriate judgment?Yes  Can read the correct time from a watch face?Yes   Advanced Directives have been discussed with the patient? Yes  List the Names of Other Physician/Practitioners you currently use: 1.  Optometrist - Dr Sabra Heck 2.  GI - Olevia Perches   Indicate any recent Medical Services you may have received from other than [facility] providers in the past year (date may be approximate).  Immunization History  Administered Date(s) Administered  . Influenza Whole 02/08/2010  . Influenza,inj,Quad PF,36+ Mos 03/07/2013  . Pneumococcal Conjugate-13 05/01/2013  . Pneumococcal Polysaccharide-23 02/08/2001  . Td 05/11/2006  . Zoster 09/09/2006    Screening Tests Health Maintenance  Topic Date Due  . INFLUENZA VACCINE  04/10/2014 (Originally 12/09/2013)  . MAMMOGRAM  02/15/2015  . TETANUS/TDAP  05/11/2016  . COLONOSCOPY  09/14/2023  . PNEUMOCOCCAL POLYSACCHARIDE VACCINE AGE 92 AND OVER  Completed  . ZOSTAVAX  Completed   Calcium Assessment Calcium Intake  # of servings/day  Calcium mg  Milk (8 oz) 1  x  300  = 300mg   Yogurt (4 oz)  1 x  200 = 200mg   Cheese (1 oz) 0 x  200 = 0  Other Calcium sources   250mg   Ca supplement 0 = 0   Estimated calcium intake per day 750mg     All answers were reviewed with the patient and necessary referrals were made:  Cherre Robins, [facility]   03/28/2014   History reviewed: allergies, current medications, past family history, past medical history, past social history, past surgical history and problem list   Objective:     Body mass index is 24.41 kg/(m^2). BP 145/80 mmHg  Pulse 78  Ht 5\' 2"  (1.575 m)  Wt 133 lb 8 oz (60.555 kg)  BMI 24.41 kg/m2  DEXA Results Date of Test T-Score for AP Spine L1-L4 T-Score for Total Left Hip T-Score for Total Right Hip  03/28/2014 -1.0 -1.5 -1.6  04/06/2012 -1.0 -1.5 -1.4  08/28/2009 -1.1 -1.5 -1.4  05/16/2007 -0.8 -1.5 -1.3     Assessment:     Annual Medicare Wellness Visit Osteopenia with stable BMD -  Has tried multiple bisphosphonates in past with intolerance to all tried. Dyslipidemia - elevated LDL and Triglycerides     Plan:     During the course of the visit the patient was educated and counseled about appropriate screening and preventive services including:    Pneumococcal vaccine - UTD  Influenza vaccine - discussed today but postpones due to patient is currently taking ABX  Td vaccine - UTD  Screening mammography - UTD  Bone densitometry screening - done today  Colorectal cancer screening - UTD  Diabetes screening - UTD  Glaucoma screening - UTD  Nutrition counseling - discussed limiting high CHO and fat foods for dyslipidemia.  Handout also given about ways to decrease triglycerices with diet.  Advanced directives: has NO advanced directive - not interested in additional information   recommend calcium 1200mg  daily through supplementation or diet.    recommend weight bearing exercise - 30 minutes at least 4 days per week.    Counseled and educated about fall risk and prevention.  Recheck DEXA:  2  years  Time spent counseling patient:  60 minutes  Patient Instructions (the written plan) was given to the patient.  Medicare Attestation I have personally reviewed: The patient's medical and social history Their use of alcohol, tobacco or illicit drugs Their current medications and supplements The patient's functional ability including ADLs,fall risks, home safety risks, cognitive, and hearing and visual impairment Diet and physical activities Evidence for depression or mood disorders  The patient's weight, height, BMI, and HR/BP have been recorded in the chart.  I have made referrals, counseling, and provided education to the patient based on review of the above and I have provided the patient with a written personalized care plan for preventive services.     Cherre Robins, [facility]   03/28/2014

## 2014-04-02 ENCOUNTER — Ambulatory Visit: Payer: Medicare Other | Admitting: Physical Therapy

## 2014-04-03 ENCOUNTER — Telehealth: Payer: Self-pay | Admitting: *Deleted

## 2014-04-03 NOTE — Telephone Encounter (Signed)
Keep appointment, but no more antibiotics.

## 2014-04-03 NOTE — Telephone Encounter (Signed)
FYI  Finished antibiotic today and pain is somewhat better She has appt with Dr Wilburn Cornelia on 04-10-14. Is there anything she needs to do in the meantime And do you think she needs to keep this appt   If meds are needed - use the drug store

## 2014-04-12 ENCOUNTER — Encounter: Payer: Self-pay | Admitting: Family Medicine

## 2014-04-12 ENCOUNTER — Ambulatory Visit (INDEPENDENT_AMBULATORY_CARE_PROVIDER_SITE_OTHER): Payer: Medicare Other | Admitting: Family Medicine

## 2014-04-12 ENCOUNTER — Ambulatory Visit (INDEPENDENT_AMBULATORY_CARE_PROVIDER_SITE_OTHER): Payer: Medicare Other | Admitting: *Deleted

## 2014-04-12 DIAGNOSIS — H6521 Chronic serous otitis media, right ear: Secondary | ICD-10-CM

## 2014-04-12 DIAGNOSIS — E559 Vitamin D deficiency, unspecified: Secondary | ICD-10-CM

## 2014-04-12 DIAGNOSIS — I10 Essential (primary) hypertension: Secondary | ICD-10-CM

## 2014-04-12 DIAGNOSIS — E785 Hyperlipidemia, unspecified: Secondary | ICD-10-CM

## 2014-04-12 DIAGNOSIS — Z23 Encounter for immunization: Secondary | ICD-10-CM

## 2014-04-12 DIAGNOSIS — R251 Tremor, unspecified: Secondary | ICD-10-CM

## 2014-04-12 NOTE — Progress Notes (Signed)
Subjective:    Patient ID: Rachel Walton, female    DOB: [birth year], 80 y.o.   MRN: [id number]  HPI Pt here for follow up and management of chronic medical problems. He should recently saw Dr. Wilburn Walton and he has determined that she has persistent serous otitis of the right ear and plans to place a  tube placement to help this in January. She is due to get lab work today and a flu shot today. She is also to be schedule with Rachel Walton for a pelvic exam.          Patient Active Problem List   Diagnosis Date Noted  . Esophageal stricture 12/11/2013  . Allergic rhinitis due to pollen 12/11/2013  . Essential hypertension 12/11/2013  . Generalized anxiety disorder 08/21/2013  . Gall stones 06/15/2013  . Hyperlipemia 11/21/2012  . Tremor   . Postmenopausal   . Colon polyp   . Osteopenia of the elderly   . Kyphosis   . Recurrent UTI   . Atrophic vaginitis   . Diverticulosis   . Meningitis, history of    Outpatient Encounter Prescriptions as of 04/12/2014  Medication Sig  . Cholecalciferol (VITAMIN D3) 2000 UNITS TABS Take 1 tablet by mouth 2 (two) times a week. As needed  . clonazePAM (KLONOPIN) 0.5 MG tablet TAKE ONE TABLET TWICE DAILY AS NEEDED  . fish oil-omega-3 fatty acids 1000 MG capsule Take 2 g by mouth daily.  . fluticasone (FLONASE) 50 MCG/ACT nasal spray   . lisinopril (PRINIVIL,ZESTRIL) 5 MG tablet TAKE ONE (1) TABLET EACH DAY  . Multiple Vitamins-Minerals (ICAPS AREDS FORMULA PO) Take 1 tablet by mouth 2 (two) times daily.  . naphazoline-pheniramine (NAPHCON-A) 0.025-0.3 % ophthalmic solution 3 (three) times a week.  Marland Kitchen omeprazole (PRILOSEC) 40 MG capsule Take 40 mg by mouth daily.  . polyvinyl alcohol-povidone (REFRESH) 1.4-0.6 % ophthalmic solution Place 1-2 drops into both eyes 2 (two) times daily.   . rosuvastatin (CRESTOR) 5 MG tablet Take 2.5 mg by mouth 3 (three) times a week.   . sodium chloride (OCEAN) 0.65 % SOLN nasal spray Place 1 spray into both  nostrils as needed for congestion.  . Triamcinolone Acetonide (NASACORT ALLERGY 24HR NA) Place into the nose.  . [DISCONTINUED] amoxicillin-clavulanate (AUGMENTIN) 500-125 MG per tablet Take 1 tablet by mouth every 12 (twelve) hours.  . [DISCONTINUED] azelastine (ASTELIN) 0.1 % nasal spray 1-2 sprays each nostril nightly  . [DISCONTINUED] Multiple Vitamins-Minerals (STRESS TAB NF PO) Take 1 tablet by mouth daily.    Review of Systems  Constitutional: Negative.   HENT: Positive for ear pain (right side facial and ear pain).   Eyes: Negative.   Respiratory: Negative.   Cardiovascular: Negative.   Gastrointestinal: Negative.   Endocrine: Negative.   Genitourinary: Negative.   Musculoskeletal: Negative.   Skin: Negative.   Allergic/Immunologic: Negative.   Neurological: Positive for dizziness.  Hematological: Negative.   Psychiatric/Behavioral: Negative.        Objective:   Physical Exam  Constitutional: She is oriented to person, place, and time. She appears well-developed and well-nourished. No distress.  HENT:  Head: Normocephalic and atraumatic.  Right Ear: External ear normal.  Left Ear: External ear normal.  Mouth/Throat: Oropharynx is clear and moist.  Nasal Congestion bilaterally  Eyes: Conjunctivae and EOM are normal. Pupils are equal, round, and reactive to light. Right eye exhibits no discharge. Left eye exhibits no discharge. No scleral icterus.  Neck: Normal range of motion. Neck supple. No thyromegaly  present.  No carotid bruits or anterior cervical adenopathy  Cardiovascular: Normal rate, regular rhythm, normal heart sounds and intact distal pulses.   No murmur heard. At 72/m  Pulmonary/Chest: Effort normal and breath sounds normal. No respiratory distress. She has no wheezes. She has no rales. She exhibits no tenderness.  Abdominal: Soft. Bowel sounds are normal. She exhibits no mass. There is no tenderness. There is no rebound and no guarding.  Good inguinal  pulses without adenopathy  Musculoskeletal: Normal range of motion. She exhibits no edema or tenderness.  Lymphadenopathy:    She has no cervical adenopathy.  Neurological: She is alert and oriented to person, place, and time. She has normal reflexes. No cranial nerve deficit.  She has her persistent tremor which is unchanged from the past mostly involving the head.  Skin: Skin is warm and dry. No rash noted.  Psychiatric: She has a normal mood and affect. Her behavior is normal. Judgment and thought content normal.  Nursing note and vitals reviewed.  BP 112/68 mmHg  Pulse 72  Temp(Src) 97.8 F (36.6 C) (Oral)  Ht 5' 2"  (1.575 m)  Wt 133 lb (60.328 kg)  BMI 24.32 kg/m2        Assessment & Plan:  1. Hyperlipidemia - POCT CBC - NMR, lipoprofile  2. Essential hypertension - POCT CBC - BMP8+EGFR - Hepatic function panel  3. Vitamin D deficiency - POCT CBC - Vit D  25 hydroxy (rtn osteoporosis monitoring)  4. Right chronic serous otitis media -Continue follow-up with ENT  5. Tremor   Patient Instructions                       Medicare Annual Wellness Visit  [name] and [name] strive to bring you the best medical care.  In doing so we not only want to address your current medical conditions and concerns but also to detect new conditions early and prevent illness, disease and health-related problems.    Medicare offers a yearly Wellness Visit which allows our clinical staff to assess your need for preventative services including immunizations, lifestyle education, counseling to decrease risk of preventable diseases and screening for fall risk and other medical concerns.    This visit is provided free of charge (no copay) for all Medicare recipients. The clinical pharmacists at [facility] have begun to conduct these Wellness Visits which will also include a thorough review of all your  medications.    As you primary medical provider recommend that you make an appointment for your Annual Wellness Visit if you have not done so already this year.  You may set up this appointment before you leave today or you may call back ([phone]) and schedule an appointment.  Please make sure when you call that you mention that you are scheduling your Annual Wellness Visit with the clinical pharmacist so that the appointment may be made for the proper length of time.     Continue current medications. Continue good therapeutic lifestyle changes which include good diet and exercise. Fall precautions discussed with patient. If an FOBT was given today- please return it to our front desk. If you are over 21 years old - you may need Prevnar 6 or the adult Pneumonia vaccine.  Flu Shots will be available at our office starting mid- September. Please call and schedule a FLU CLINIC APPOINTMENT.   Continue to follow up with ear nose and throat specialists  Make sure you follow up with your pelvic exam with the nurse practitioner Use Nasacort as directed by ENT This winter, drink plenty of fluids and keep the house cooler We will call you with the results of your lab work as soon as those results are available   Arrie Senate MD

## 2014-04-12 NOTE — Addendum Note (Signed)
Addended by: Earlene Plater on: 04/12/2014 12:33 PM   Modules accepted: Orders

## 2014-04-12 NOTE — Patient Instructions (Addendum)
Medicare Annual Wellness Visit  [facility] and the medical providers at [facility] strive to bring you the best medical care.  In doing so we not only want to address your current medical conditions and concerns but also to detect new conditions early and prevent illness, disease and health-related problems.    Medicare offers a yearly Wellness Visit which allows our clinical staff to assess your need for preventative services including immunizations, lifestyle education, counseling to decrease risk of preventable diseases and screening for fall risk and other medical concerns.    This visit is provided free of charge (no copay) for all Medicare recipients. The clinical pharmacists at [facility] have begun to conduct these Wellness Visits which will also include a thorough review of all your medications.    As you primary medical provider recommend that you make an appointment for your Annual Wellness Visit if you have not done so already this year.  You may set up this appointment before you leave today or you may call back ([phone]) and schedule an appointment.  Please make sure when you call that you mention that you are scheduling your Annual Wellness Visit with the clinical pharmacist so that the appointment may be made for the proper length of time.     Continue current medications. Continue good therapeutic lifestyle changes which include good diet and exercise. Fall precautions discussed with patient. If an FOBT was given today- please return it to our front desk. If you are over 25 years old - you may need Prevnar 61 or the adult Pneumonia vaccine.  Flu Shots will be available at our office starting mid- September. Please call and schedule a FLU CLINIC APPOINTMENT.   Continue to follow up with ear nose and throat specialists Make sure you follow up with your pelvic exam with the nurse practitioner Use Nasacort as  directed by ENT This winter, drink plenty of fluids and keep the house cooler We will call you with the results of your lab work as soon as those results are available

## 2014-04-13 LAB — BMP8+EGFR
BUN / CREAT RATIO: 10 — AB (ref 11–26)
BUN: 9 mg/dL (ref 8–27)
CO2: 27 mmol/L (ref 18–29)
CREATININE: 0.9 mg/dL (ref 0.57–1.00)
Calcium: 9.2 mg/dL (ref 8.7–10.3)
Chloride: 102 mmol/L (ref 97–108)
GFR calc non Af Amer: 60 mL/min/{1.73_m2} (ref >59)
GFR, EST AFRICAN AMERICAN: 69 mL/min/{1.73_m2} (ref >59)
Glucose: 95 mg/dL (ref 65–99)
Potassium: 4.6 mmol/L (ref 3.5–5.2)
SODIUM: 142 mmol/L (ref 134–144)

## 2014-04-13 LAB — CBC WITH DIFFERENTIAL
BASOS ABS: 0.1 10*3/uL (ref 0.0–0.2)
Basos: 1 %
EOS ABS: 0.1 10*3/uL (ref 0.0–0.4)
Eos: 3 %
HEMATOCRIT: 41.6 % (ref 34.0–46.6)
Hemoglobin: 13.7 g/dL (ref 11.1–15.9)
IMMATURE GRANS (ABS): 0 10*3/uL (ref 0.0–0.1)
Immature Granulocytes: 0 %
LYMPHS: 45 %
Lymphocytes Absolute: 2.3 10*3/uL (ref 0.7–3.1)
MCH: 29.5 pg (ref 26.6–33.0)
MCHC: 32.9 g/dL (ref 31.5–35.7)
MCV: 90 fL (ref 79–97)
Monocytes Absolute: 0.4 10*3/uL (ref 0.1–0.9)
Monocytes: 8 %
Neutrophils Absolute: 2.1 10*3/uL (ref 1.4–7.0)
Neutrophils Relative %: 43 %
PLATELETS: 282 10*3/uL (ref 150–379)
RBC: 4.65 x10E6/uL (ref 3.77–5.28)
RDW: 13.8 % (ref 12.3–15.4)
WBC: 5 10*3/uL (ref 3.4–10.8)

## 2014-04-13 LAB — HEPATIC FUNCTION PANEL
ALT: 25 IU/L (ref 0–32)
AST: 23 IU/L (ref 0–40)
Albumin: 4 g/dL (ref 3.5–4.7)
Alkaline Phosphatase: 66 IU/L (ref 39–117)
Bilirubin, Direct: 0.1 mg/dL (ref 0.00–0.40)
Total Bilirubin: 0.5 mg/dL (ref 0.0–1.2)
Total Protein: 6.1 g/dL (ref 6.0–8.5)

## 2014-04-13 LAB — NMR, LIPOPROFILE
Cholesterol: 265 mg/dL — ABNORMAL HIGH (ref 100–199)
HDL CHOLESTEROL BY NMR: 82 mg/dL (ref >39)
HDL Particle Number: 43.5 umol/L (ref >=30.5)
LDL Particle Number: 1595 nmol/L — ABNORMAL HIGH (ref <1000)
LDL SIZE: 22 nm (ref >20.5)
LDL-C: 147 mg/dL — ABNORMAL HIGH (ref 0–99)
LP-IR Score: 32 (ref <=45)
Small LDL Particle Number: 150 nmol/L (ref <=527)
Triglycerides by NMR: 181 mg/dL — ABNORMAL HIGH (ref 0–149)

## 2014-04-13 LAB — VITAMIN D 25 HYDROXY (VIT D DEFICIENCY, FRACTURES): Vit D, 25-Hydroxy: 29.2 ng/mL — ABNORMAL LOW (ref 30.0–100.0)

## 2014-04-17 ENCOUNTER — Telehealth: Payer: Self-pay | Admitting: *Deleted

## 2014-04-17 NOTE — Telephone Encounter (Signed)
Please call. Cholesterol still elevated. Are you taking crestor 5 mg every day?  If so , we need to up the dose.  If not,  You need to take daily.

## 2014-04-19 NOTE — Telephone Encounter (Signed)
Pt aware of results and is aware of medication changes; Pt states she takes no medicaton consistently

## 2014-04-19 NOTE — Telephone Encounter (Signed)
-----   Message from Chipper Herb, MD sent at 04/13/2014  2:08 PM EST ----- The blood sugar is good at 95. The creatinine, the most important kidney function test is within normal limits. The electrolytes including potassium are within normal limits.  All liver function tests are within normal limits Cholesterol numbers with advanced lipid testing have an LDL particle number that remains elevated at 1595. The LDL C is elevated at 147. The triglycerides are elevated at 181. The good cholesterol or the HDL particle number is good.----- please confirm with the patient how she is currently taking her Crestor 5 mg. If she is taking 5 mg daily she should increase this to 10 mg daily. If she is not taking it regularly she should take the 5 mg regularly every day. It is also important to him reemphasized aggressive therapeutic lifestyle changes which include diet and exercise The vitamin D level remains low.----Please confirm current treatment----if she is taking 2000 daily she should increase this intake 2000 daily Monday through Friday and 4000 on Saturday and Sunday

## 2014-05-14 ENCOUNTER — Encounter: Payer: Self-pay | Admitting: *Deleted

## 2014-06-06 ENCOUNTER — Telehealth: Payer: Self-pay | Admitting: Family Medicine

## 2014-06-07 ENCOUNTER — Encounter: Payer: Self-pay | Admitting: Family Medicine

## 2014-06-07 ENCOUNTER — Ambulatory Visit (INDEPENDENT_AMBULATORY_CARE_PROVIDER_SITE_OTHER): Payer: Medicare Other | Admitting: Family Medicine

## 2014-06-07 VITALS — BP 124/68 | HR 83 | Temp 98.8°F | Ht 62.0 in | Wt 136.0 lb

## 2014-06-07 DIAGNOSIS — J069 Acute upper respiratory infection, unspecified: Secondary | ICD-10-CM

## 2014-06-07 MED ORDER — AZITHROMYCIN 250 MG PO TABS: ORAL_TABLET | ORAL | Status: DC

## 2014-06-07 NOTE — Telephone Encounter (Signed)
Appointment given for today with Sallyanne Havers, [place]

## 2014-06-07 NOTE — Progress Notes (Signed)
   Subjective:    Patient ID: Rachel Walton, female    DOB: May 14, 1929, 81 y.o.   MRN: [id number]  HPI C/o URI sx's for over a week.  Review of Systems  Constitutional: Negative for fever.  HENT: Negative for ear pain.   Eyes: Negative for discharge.  Respiratory: Negative for cough.   Cardiovascular: Negative for chest pain.  Gastrointestinal: Negative for abdominal distention.  Endocrine: Negative for polyuria.  Genitourinary: Negative for difficulty urinating.  Musculoskeletal: Negative for gait problem and neck pain.  Skin: Negative for color change and rash.  Neurological: Negative for speech difficulty and headaches.  Psychiatric/Behavioral: Negative for agitation.       Objective:    BP 124/68 mmHg  Pulse 83  Temp(Src) 98.8 F (37.1 C) (Oral)  Ht 5\' 2"  (1.575 m)  Wt 136 lb (61.689 kg)  BMI 24.87 kg/m2 Physical Exam  Constitutional: She is oriented to person, place, and time. She appears well-developed and well-nourished.  HENT:  Head: Normocephalic and atraumatic.  Mouth/Throat: Oropharynx is clear and moist.  Eyes: Pupils are equal, round, and reactive to light.  Neck: Normal range of motion. Neck supple.  Cardiovascular: Normal rate and regular rhythm.   No murmur heard. Pulmonary/Chest: Effort normal and breath sounds normal.  Abdominal: Soft. Bowel sounds are normal. There is no tenderness.  Neurological: She is alert and oriented to person, place, and time.  Skin: Skin is warm and dry.  Psychiatric: She has a normal mood and affect.          Assessment & Plan:     ICD-9-CM ICD-10-CM   1. URI (upper respiratory infection) 465.9 J06.9 azithromycin (ZITHROMAX) 250 MG tablet   Push po fluids, rest, tylenol and motrin otc prn as directed for fever, arthralgias, and myalgias.  Follow up prn if sx's continue or persist.  No Follow-up on file.  Lysbeth Penner FNP

## 2014-06-14 ENCOUNTER — Telehealth: Payer: Self-pay | Admitting: *Deleted

## 2014-06-14 MED ORDER — LEVOFLOXACIN 500 MG PO TABS: 500.0000 mg | ORAL_TABLET | Freq: Every day | ORAL | Status: DC

## 2014-06-14 NOTE — Telephone Encounter (Signed)
Pt aware.

## 2014-06-14 NOTE — Telephone Encounter (Signed)
Finished Zpak on Monday  Still having a lot of sinus headache and pain across eyebrow area Still having congestion - yellow - tint No fever Is hoarse   Any suggestions  i gave her a mucinex sheet She is already using nose spray

## 2014-06-14 NOTE — Telephone Encounter (Signed)
If she is not allergic to Levaquin, do 500 mg #7 one daily for 7 days along with Mucinex and nasal saline

## 2014-06-16 ENCOUNTER — Telehealth: Payer: Self-pay | Admitting: Family Medicine

## 2014-06-16 NOTE — Telephone Encounter (Signed)
levaquin gave pt a headache and kept her awake all night Please change to something else

## 2014-06-17 NOTE — Telephone Encounter (Signed)
I call the patient on Sunday and told her to try half of tablet with food this evening and if it did not bother her to try another half in the morning with breakfast and she will call us if she continues to have problems.

## 2014-07-03 ENCOUNTER — Telehealth: Payer: Self-pay | Admitting: Internal Medicine

## 2014-07-03 NOTE — Telephone Encounter (Signed)
It would be OK with me to take ASA 81 mg qd if Dr [name] recommends it. She needs to continue Zantac as prescribed for recent ulcer treatment.

## 2014-07-03 NOTE — Telephone Encounter (Signed)
Spoke with patient and she is having some tingling in her fingers. She thinks it may carpal tunnel and is seeing a doctor for this. She is interested in taking Aspirin 81 mg daily if Dr. Olevia Perches thinks this is okay. Please, advise.

## 2014-07-04 NOTE — Telephone Encounter (Signed)
Patient notified of recommendations. 

## 2014-08-10 ENCOUNTER — Ambulatory Visit: Payer: Medicare Other | Admitting: Family Medicine

## 2014-08-14 ENCOUNTER — Encounter: Payer: Self-pay | Admitting: Physician Assistant

## 2014-08-14 ENCOUNTER — Ambulatory Visit (INDEPENDENT_AMBULATORY_CARE_PROVIDER_SITE_OTHER): Payer: Medicare Other

## 2014-08-14 ENCOUNTER — Ambulatory Visit: Payer: Medicare Other | Admitting: Family Medicine

## 2014-08-14 ENCOUNTER — Ambulatory Visit (INDEPENDENT_AMBULATORY_CARE_PROVIDER_SITE_OTHER): Payer: Medicare Other | Admitting: Physician Assistant

## 2014-08-14 VITALS — BP 150/68 | HR 76 | Temp 97.1°F | Ht 62.0 in | Wt 136.4 lb

## 2014-08-14 DIAGNOSIS — M25571 Pain in right ankle and joints of right foot: Secondary | ICD-10-CM

## 2014-08-14 NOTE — Patient Instructions (Signed)
Ankle Sprain  An ankle sprain is an injury to the strong, fibrous tissues (ligaments) that hold your ankle bones together.   HOME CARE   · Put ice on your ankle for 1-2 days or as told by your doctor.  ¨ Put ice in a plastic bag.  ¨ Place a towel between your skin and the bag.  ¨ Leave the ice on for 15-20 minutes at a time, every 2 hours while you are awake.  · Only take medicine as told by your doctor.  · Raise (elevate) your injured ankle above the level of your heart as much as possible for 2-3 days.  · Use crutches if your doctor tells you to. Slowly put your own weight on the affected ankle. Use the crutches until you can walk without pain.  · If you have a plaster splint:  ¨ Do not rest it on anything harder than a pillow for 24 hours.  ¨ Do not put weight on it.  ¨ Do not get it wet.  ¨ Take it off to shower or bathe.  · If given, use an elastic wrap or support stocking for support. Take the wrap off if your toes lose feeling (numb), tingle, or turn cold or blue.  · If you have an air splint:  ¨ Add or let out air to make it comfortable.  ¨ Take it off at night and to shower and bathe.  ¨ Wiggle your toes and move your ankle up and down often while you are wearing it.  GET HELP IF:  · You have rapidly increasing bruising or puffiness (swelling).  · Your toes feel very cold.  · You lose feeling in your foot.  · Your medicine does not help your pain.  GET HELP RIGHT AWAY IF:   · Your toes lose feeling (numb) or turn blue.  · You have severe pain that is increasing.  MAKE SURE YOU:   · Understand these instructions.  · Will watch your condition.  · Will get help right away if you are not doing well or get worse.  Document Released: 10/14/2007 Document Revised: 09/11/2013 Document Reviewed: 11/09/2011  [employer]® Patient Information ©2015 [employer]. This information is not intended to replace advice given to you by your health care provider. Make sure you discuss any questions you have with your health care  provider.

## 2014-08-14 NOTE — Progress Notes (Signed)
   Subjective:    Patient ID: Rachel Walton, female    DOB: 1929-10-22, 80 y.o.   MRN: [id number]  HPI 81 y/o female presents with c/o right ankle pain s/p fall 3 days ago. Thinks that her ankle turned inverted when falling down steps. She did not fall to the ground. Has been wearing lace up brace and applied ice. Has not tried antiinflammatory. Prior ankle injury on same side several years ago.    Review of Systems  Musculoskeletal:       Right ankle pain and mild swelling.        Objective:   Physical Exam  Musculoskeletal: Normal range of motion. She exhibits edema (trace of edema on lateral malleolus ) and tenderness (lateral ttp on malleolus).  Xray negative for fracture, dislocation   Vitals reviewed.         Assessment & Plan:  1. Right ankle sprain: Rest, ice, compression with ankle brace and elevation. Discussed nsaids with patient but she has had problems in the past with ibuprofen, etc so decided against nsaid treatment. She should rtc if s/s worsen or do not improve.

## 2014-08-30 ENCOUNTER — Encounter: Payer: Self-pay | Admitting: *Deleted

## 2014-08-30 ENCOUNTER — Ambulatory Visit (INDEPENDENT_AMBULATORY_CARE_PROVIDER_SITE_OTHER): Payer: Medicare Other | Admitting: *Deleted

## 2014-08-30 VITALS — BP 145/59 | HR 71 | Ht 62.0 in | Wt 137.0 lb

## 2014-08-30 DIAGNOSIS — Z0000 Encounter for general adult medical examination without abnormal findings: Secondary | ICD-10-CM

## 2014-08-30 NOTE — Progress Notes (Signed)
Subjective:   Rachel Walton is a 81 y.o. female who presents for an Initial Medicare Annual Wellness Visit.  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  She lives with her husband.  Rachel Walton states that she is under a significant amount of stress as one of her sons was diagnosed with brain cancer in December.  He is going through some complications and is currently in a nursing facility get rehabilitation.  She and her husband visit him daily.  She also states that she went earlier in the week to see Dr. Amedeo Plenty because she has been having hand pain and finger numbness.  She is scheduled for an EMG in 2 weeks to determine if she has carpal tunnel.    Review of Systems      Cardiac Risk Factors include: advanced age (>84men, >50 women);hypertension     Objective:    Today's Vitals   08/30/14 1131  BP: 145/59  Pulse: 71  Height: 5\' 2"  (1.575 Walton)  Weight: 137 lb (62.143 kg)  PainSc: 2   PainLoc: Hand    Current Medications (verified) Outpatient Encounter Prescriptions as of 08/30/2014  Medication Sig  . Cholecalciferol (VITAMIN D3) 2000 UNITS TABS Take 1 tablet by mouth 2 (two) times a week. As needed  . clonazePAM (KLONOPIN) 0.5 MG tablet TAKE ONE TABLET TWICE DAILY AS NEEDED  . fish oil-omega-3 fatty acids 1000 MG capsule Take 2 g by mouth daily.  . fluticasone (FLONASE) 50 MCG/ACT nasal spray as needed.   Marland Kitchen lisinopril (PRINIVIL,ZESTRIL) 5 MG tablet TAKE ONE (1) TABLET EACH DAY  . Multiple Vitamins-Minerals (ICAPS AREDS FORMULA PO) Take 1 tablet by mouth 2 (two) times daily.  . naphazoline-pheniramine (NAPHCON-A) 0.025-0.3 % ophthalmic solution 3 (three) times a week.  Marland Kitchen omeprazole (PRILOSEC) 40 MG capsule Take 40 mg by mouth daily as needed.   . polyvinyl alcohol-povidone (REFRESH) 1.4-0.6 % ophthalmic solution Place 1-2 drops into both eyes 2 (two) times daily.   . rosuvastatin (CRESTOR) 5 MG tablet Take 5 mg by mouth 3 (three) times a week.   . sodium  chloride (OCEAN) 0.65 % SOLN nasal spray Place 1 spray into both nostrils as needed for congestion.  Marland Kitchen levofloxacin (LEVAQUIN) 500 MG tablet Take 1 tablet (500 mg total) by mouth daily. (Patient not taking: Reported on 08/30/2014)  . Triamcinolone Acetonide (NASACORT ALLERGY 24HR NA) Place into the nose daily as needed.     Allergies (verified) Alendronate sodium; Amoxicillin; Cephalexin; Prednisone; Sudafed; Sulfa antibiotics; Toprol xl; and Trimethoprim   History: Past Medical History  Diagnosis Date  . Tremor   . Postmenopausal   . Hx of adenomatous colonic polyps   . Kyphosis   . Recurrent UTI   . Atrophic vaginitis   . Diverticulosis   . Meningitis   . Cataract   . Hyperlipidemia   . Diverticulosis   . Osteopenia of the elderly   . Macular degeneration   . Breast cyst    Past Surgical History  Procedure Laterality Date  . Total abdominal hysterectomy w/ bilateral salpingoophorectomy    . Back surgery    . Appendectomy    . Cataract surgery    . Eye surgery    . Breast surgery      cyst removal  . Abdominal hysterectomy  1980   Family History  Problem Relation Age of Onset  . Colon cancer      nephew/sibling  . Stroke Mother   . Heart  disease Father     MI  . Cancer Sister     Brain tumor  . Hypertension Sister   . Alcohol abuse Sister   . Cancer Brother     Colon cancer  . Heart disease Brother   . Diabetes Son   . Diabetes Maternal Uncle   . Diabetes Son   . Hypertension Sister   . Cancer Sister     breast  . Hypertension Sister   . Hypertension Sister   . Hypertension Sister   . Stroke Sister   . Hypertension Sister   . Hypertension Sister    Social History   Occupational History  . Retired    Social History Main Topics  . Smoking status: Never Smoker   . Smokeless tobacco: Never Used  . Alcohol Use: No  . Drug Use: No  . Sexual Activity: No    Tobacco Counseling Counseling given: No   Activities of Daily Living In your present  state of health, do you have any difficulty performing the following activities: 08/30/2014  Hearing? N  Vision? N  Difficulty concentrating or making decisions? N  Walking or climbing stairs? N  Dressing or bathing? N  Doing errands, shopping? N  Preparing Food and eating ? N  Using the Toilet? N  In the past six months, have you accidently leaked urine? Y  Do you have problems with loss of bowel control? N  Managing your Medications? N  Managing your Finances? N  Housekeeping or managing your Housekeeping? N    Immunizations and Health Maintenance Immunization History  Administered Date(s) Administered  . Influenza Whole 02/08/2010  . Influenza,inj,Quad PF,36+ Mos 03/07/2013, 04/12/2014  . Pneumococcal Conjugate-13 05/01/2013  . Pneumococcal Polysaccharide-23 02/08/2001  . Td 05/11/2006  . Zoster 09/09/2006   There are no preventive care reminders to display for this patient.  Patient Care Team: Chipper Herb, MD as PCP - General (Family Medicine) Lafayette Dragon, MD as Consulting Physician (Gastroenterology) Alphonsa Overall, MD as Consulting Physician (General Surgery) Irine Seal, MD as Attending Physician (Urology)       Assessment:   This is a routine wellness examination for Rachel Walton.   Hearing/Vision screen Sees Dr. Sabra Heck at [facility] in [place], [place]- recently seen - in the process of getting new glasses now He is following her for mild macular degeneration also, but states it currently does not require any intervention History of bilateral cataract surgery by Dr. Herbert Deaner No hearing deficit noted  Dietary issues and exercise activities discussed: Current Exercise Habits:: Home exercise routine, Type of exercise: walking, Time (Minutes): 30, Frequency (Times/Week): 3, Weekly Exercise (Minutes/Week): 90, Intensity: Mild  Has not been able to walk as much lately, as she is spending a lot of time with her son who has brain cancer.    Depression  Screen PHQ 2/9 Scores 08/30/2014 04/12/2014 03/28/2014 12/11/2013  PHQ - 2 Score 1 0 1 0  PHQ- 9 Score - - 2 -       Fall Risk Fall Risk  08/30/2014 04/12/2014 03/28/2014 12/11/2013 11/21/2012  Falls in the past year? Yes No No Yes No  Number falls in past yr: 1 - - 1 -  Injury with Fall? Yes - - Yes -  Risk for fall due to : History of fall(s) - Impaired balance/gait - -   Tripped and fell, causing a sprained ankle while on vacation at [place].  Does not feel she has balance problems.  Cognitive Function: MMSE -  Mini Mental State Exam 08/30/2014  Orientation to time 5  Orientation to Place 5  Registration 3  Attention/ Calculation 5  Recall 3  Language- name 2 objects 2  Language- repeat 1  Language- follow 3 step command 3  Language- read & follow direction 1  Write a sentence 1  Copy design 1  Total score 30    Screening Tests Health Maintenance  Topic Date Due  . INFLUENZA VACCINE  12/10/2014  . MAMMOGRAM  02/15/2015  . TETANUS/TDAP  05/11/2016  . COLONOSCOPY  09/14/2023  . DEXA SCAN  Completed  . ZOSTAVAX  Completed  . PNA vac Low Risk Adult  Completed      Plan:     During the course of the visit, Rachel Walton was educated and counseled about the following appropriate screening and preventive services:   Vaccines to include Pneumoccal, Influenza, Td, Zostavax,- up to date  Colorectal cancer screening- up to date  Bone density screening - up to date  Diabetes screening- up to date  Glaucoma screening- up to date  Mammography/PAP- up to date  Nutrition counseling discussed today   Patient Instructions (the written plan) were given to the patient.    Rachel Wurzer M, RN   08/30/2014      I have reviewed and agree with the above AWV documentation.  Claretta Fraise, Walton.D.

## 2014-08-30 NOTE — Patient Instructions (Signed)
Thank you for coming in for your Annual Wellness Visit today!  This is the website to look advanced directives [email or web address]  Preventive Care for Adults A healthy lifestyle and preventive care can promote health and wellness. Preventive health guidelines for women include the following key practices.  A routine yearly physical is a good way to check with your health care provider about your health and preventive screening. It is a chance to share any concerns and updates on your health and to receive a thorough exam.  Visit your dentist for a routine exam and preventive care every 6 months. Brush your teeth twice a day and floss once a day. Good oral hygiene prevents tooth decay and gum disease.  The frequency of eye exams is based on your age, health, family medical history, use of contact lenses, and other factors. Follow your health care provider's recommendations for frequency of eye exams.  Eat a healthy diet. Foods like vegetables, fruits, whole grains, low-fat dairy products, and lean protein foods contain the nutrients you need without too many calories. Decrease your intake of foods high in solid fats, added sugars, and salt. Eat the right amount of calories for you.Get information about a proper diet from your health care provider, if necessary.  Regular physical exercise is one of the most important things you can do for your health. Most adults should get at least 150 minutes of moderate-intensity exercise (any activity that increases your heart rate and causes you to sweat) each week. In addition, most adults need muscle-strengthening exercises on 2 or more days a week.  Maintain a healthy weight. The body mass index (BMI) is a screening tool to identify possible weight problems. It provides an estimate of body fat based on height and weight. Your health care provider can find your BMI and can help you achieve or maintain a healthy weight.For adults 20 years and older:  A BMI  below 18.5 is considered underweight.  A BMI of 18.5 to 24.9 is normal.  A BMI of 25 to 29.9 is considered overweight.  A BMI of 30 and above is considered obese.  Maintain normal blood lipids and cholesterol levels by exercising and minimizing your intake of saturated fat. Eat a balanced diet with plenty of fruit and vegetables. Blood tests for lipids and cholesterol should begin at age 12 and be repeated every 5 years. If your lipid or cholesterol levels are high, you are over 50, or you are at high risk for heart disease, you may need your cholesterol levels checked more frequently.Ongoing high lipid and cholesterol levels should be treated with medicines if diet and exercise are not working.  If you smoke, find out from your health care provider how to quit. If you do not use tobacco, do not start.  Lung cancer screening is recommended for adults aged 6-80 years who are at high risk for developing lung cancer because of a history of smoking. A yearly low-dose CT scan of the lungs is recommended for people who have at least a 30-pack-year history of smoking and are a current smoker or have quit within the past 15 years. A pack year of smoking is smoking an average of 1 pack of cigarettes a day for 1 year (for example: 1 pack a day for 30 years or 2 packs a day for 15 years). Yearly screening should continue until the smoker has stopped smoking for at least 15 years. Yearly screening should be stopped for people who develop a health problem  that would prevent them from having lung cancer treatment.  If you are pregnant, do not drink alcohol. If you are breastfeeding, be very cautious about drinking alcohol. If you are not pregnant and choose to drink alcohol, do not have more than 1 drink per day. One drink is considered to be 12 ounces (355 mL) of beer, 5 ounces (148 mL) of wine, or 1.5 ounces (44 mL) of liquor.  Avoid use of street drugs. Do not share needles with anyone. Ask for help if you  need support or instructions about stopping the use of drugs.  High blood pressure causes heart disease and increases the risk of stroke. Your blood pressure should be checked at least every 1 to 2 years. Ongoing high blood pressure should be treated with medicines if weight loss and exercise do not work.  If you are 58-63 years old, ask your health care provider if you should take aspirin to prevent strokes.  Diabetes screening involves taking a blood sample to check your fasting blood sugar level. This should be done once every 3 years, after age 77, if you are within normal weight and without risk factors for diabetes. Testing should be considered at a younger age or be carried out more frequently if you are overweight and have at least 1 risk factor for diabetes.  Breast cancer screening is essential preventive care for women. You should practice "breast self-awareness." This means understanding the normal appearance and feel of your breasts and may include breast self-examination. Any changes detected, no matter how small, should be reported to a health care provider. Women in their 26s and 30s should have a clinical breast exam (CBE) by a health care provider as part of a regular health exam every 1 to 3 years. After age 60, women should have a CBE every year. Starting at age 58, women should consider having a mammogram (breast X-ray test) every year. Women who have a family history of breast cancer should talk to their health care provider about genetic screening. Women at a high risk of breast cancer should talk to their health care providers about having an MRI and a mammogram every year.  Breast cancer gene (BRCA)-related cancer risk assessment is recommended for women who have family members with BRCA-related cancers. BRCA-related cancers include breast, ovarian, tubal, and peritoneal cancers. Having family members with these cancers may be associated with an increased risk for harmful changes  (mutations) in the breast cancer genes BRCA1 and BRCA2. Results of the assessment will determine the need for genetic counseling and BRCA1 and BRCA2 testing.  Routine pelvic exams to screen for cancer are no longer recommended for nonpregnant women who are considered low risk for cancer of the pelvic organs (ovaries, uterus, and vagina) and who do not have symptoms. Ask your health care provider if a screening pelvic exam is right for you.  If you have had past treatment for cervical cancer or a condition that could lead to cancer, you need Pap tests and screening for cancer for at least 20 years after your treatment. If Pap tests have been discontinued, your risk factors (such as having a new sexual partner) need to be reassessed to determine if screening should be resumed. Some women have medical problems that increase the chance of getting cervical cancer. In these cases, your health care provider may recommend more frequent screening and Pap tests.  The HPV test is an additional test that may be used for cervical cancer screening. The HPV test  looks for the virus that can cause the cell changes on the cervix. The cells collected during the Pap test can be tested for HPV. The HPV test could be used to screen women aged 39 years and older, and should be used in women of any age who have unclear Pap test results. After the age of 39, women should have HPV testing at the same frequency as a Pap test.  Colorectal cancer can be detected and often prevented. Most routine colorectal cancer screening begins at the age of 65 years and continues through age 35 years. However, your health care provider may recommend screening at an earlier age if you have risk factors for colon cancer. On a yearly basis, your health care provider may provide home test kits to check for hidden blood in the stool. Use of a small camera at the end of a tube, to directly examine the colon (sigmoidoscopy or colonoscopy), can detect the  earliest forms of colorectal cancer. Talk to your health care provider about this at age 8, when routine screening begins. Direct exam of the colon should be repeated every 5-10 years through age 16 years, unless early forms of pre-cancerous polyps or small growths are found.  People who are at an increased risk for hepatitis B should be screened for this virus. You are considered at high risk for hepatitis B if:  You were born in a country where hepatitis B occurs often. Talk with your health care provider about which countries are considered high risk.  Your parents were born in a high-risk country and you have not received a shot to protect against hepatitis B (hepatitis B vaccine).  You have HIV or AIDS.  You use needles to inject street drugs.  You live with, or have sex with, someone who has hepatitis B.  You get hemodialysis treatment.  You take certain medicines for conditions like cancer, organ transplantation, and autoimmune conditions.  Hepatitis C blood testing is recommended for all people born from [birth year] through 1965 and any individual with known risks for hepatitis C.  Practice safe sex. Use condoms and avoid high-risk sexual practices to reduce the spread of sexually transmitted infections (STIs). STIs include gonorrhea, chlamydia, syphilis, trichomonas, herpes, HPV, and human immunodeficiency virus (HIV). Herpes, HIV, and HPV are viral illnesses that have no cure. They can result in disability, cancer, and death.  You should be screened for sexually transmitted illnesses (STIs) including gonorrhea and chlamydia if:  You are sexually active and are younger than 24 years.  You are older than 24 years and your health care provider tells you that you are at risk for this type of infection.  Your sexual activity has changed since you were last screened and you are at an increased risk for chlamydia or gonorrhea. Ask your health care provider if you are at risk.  If you are  at risk of being infected with HIV, it is recommended that you take a prescription medicine daily to prevent HIV infection. This is called preexposure prophylaxis (PrEP). You are considered at risk if:  You are a heterosexual woman, are sexually active, and are at increased risk for HIV infection.  You take drugs by injection.  You are sexually active with a partner who has HIV.  Talk with your health care provider about whether you are at high risk of being infected with HIV. If you choose to begin PrEP, you should first be tested for HIV. You should then be tested every 3  months for as long as you are taking PrEP.  Osteoporosis is a disease in which the bones lose minerals and strength with aging. This can result in serious bone fractures or breaks. The risk of osteoporosis can be identified using a bone density scan. Women ages 89 years and over and women at risk for fractures or osteoporosis should discuss screening with their health care providers. Ask your health care provider whether you should take a calcium supplement or vitamin D to reduce the rate of osteoporosis.  Menopause can be associated with physical symptoms and risks. Hormone replacement therapy is available to decrease symptoms and risks. You should talk to your health care provider about whether hormone replacement therapy is right for you.  Use sunscreen. Apply sunscreen liberally and repeatedly throughout the day. You should seek shade when your shadow is shorter than you. Protect yourself by wearing long sleeves, pants, a wide-brimmed hat, and sunglasses year round, whenever you are outdoors.  Once a month, do a whole body skin exam, using a mirror to look at the skin on your back. Tell your health care provider of new moles, moles that have irregular borders, moles that are larger than a pencil eraser, or moles that have changed in shape or color.  Stay current with required vaccines (immunizations).  Influenza vaccine.  All adults should be immunized every year.  Tetanus, diphtheria, and acellular pertussis (Td, Tdap) vaccine. Pregnant women should receive 1 dose of Tdap vaccine during each pregnancy. The dose should be obtained regardless of the length of time since the last dose. Immunization is preferred during the 27th-36th week of gestation. An adult who has not previously received Tdap or who does not know her vaccine status should receive 1 dose of Tdap. This initial dose should be followed by tetanus and diphtheria toxoids (Td) booster doses every 10 years. Adults with an unknown or incomplete history of completing a 3-dose immunization series with Td-containing vaccines should begin or complete a primary immunization series including a Tdap dose. Adults should receive a Td booster every 10 years.  Varicella vaccine. An adult without evidence of immunity to varicella should receive 2 doses or a second dose if she has previously received 1 dose. Pregnant females who do not have evidence of immunity should receive the first dose after pregnancy. This first dose should be obtained before leaving the health care facility. The second dose should be obtained 4-8 weeks after the first dose.  Human papillomavirus (HPV) vaccine. Females aged 13-26 years who have not received the vaccine previously should obtain the 3-dose series. The vaccine is not recommended for use in pregnant females. However, pregnancy testing is not needed before receiving a dose. If a female is found to be pregnant after receiving a dose, no treatment is needed. In that case, the remaining doses should be delayed until after the pregnancy. Immunization is recommended for any person with an immunocompromised condition through the age of 65 years if she did not get any or all doses earlier. During the 3-dose series, the second dose should be obtained 4-8 weeks after the first dose. The third dose should be obtained 24 weeks after the first dose and 16  weeks after the second dose.  Zoster vaccine. One dose is recommended for adults aged 29 years or older unless certain conditions are present.  Measles, mumps, and rubella (MMR) vaccine. Adults born before [birth year] generally are considered immune to measles and mumps. Adults born in [birth year] or later should have 1  or more doses of MMR vaccine unless there is a contraindication to the vaccine or there is laboratory evidence of immunity to each of the three diseases. A routine second dose of MMR vaccine should be obtained at least 28 days after the first dose for students attending postsecondary schools, health care workers, or international travelers. People who received inactivated measles vaccine or an unknown type of measles vaccine during 1963-1967 should receive 2 doses of MMR vaccine. People who received inactivated mumps vaccine or an unknown type of mumps vaccine before 1979 and are at high risk for mumps infection should consider immunization with 2 doses of MMR vaccine. For females of childbearing age, rubella immunity should be determined. If there is no evidence of immunity, females who are not pregnant should be vaccinated. If there is no evidence of immunity, females who are pregnant should delay immunization until after pregnancy. Unvaccinated health care workers born before [birth year] who lack laboratory evidence of measles, mumps, or rubella immunity or laboratory confirmation of disease should consider measles and mumps immunization with 2 doses of MMR vaccine or rubella immunization with 1 dose of MMR vaccine.  Pneumococcal 13-valent conjugate (PCV13) vaccine. When indicated, a person who is uncertain of her immunization history and has no record of immunization should receive the PCV13 vaccine. An adult aged 8 years or older who has certain medical conditions and has not been previously immunized should receive 1 dose of PCV13 vaccine. This PCV13 should be followed with a dose of pneumococcal  polysaccharide (PPSV23) vaccine. The PPSV23 vaccine dose should be obtained at least 8 weeks after the dose of PCV13 vaccine. An adult aged 42 years or older who has certain medical conditions and previously received 1 or more doses of PPSV23 vaccine should receive 1 dose of PCV13. The PCV13 vaccine dose should be obtained 1 or more years after the last PPSV23 vaccine dose.  Pneumococcal polysaccharide (PPSV23) vaccine. When PCV13 is also indicated, PCV13 should be obtained first. All adults aged 12 years and older should be immunized. An adult younger than age 65 years who has certain medical conditions should be immunized. Any person who resides in a nursing home or long-term care facility should be immunized. An adult smoker should be immunized. People with an immunocompromised condition and certain other conditions should receive both PCV13 and PPSV23 vaccines. People with human immunodeficiency virus (HIV) infection should be immunized as soon as possible after diagnosis. Immunization during chemotherapy or radiation therapy should be avoided. Routine use of PPSV23 vaccine is not recommended for American Indians, [place], or people younger than 65 years unless there are medical conditions that require PPSV23 vaccine. When indicated, people who have unknown immunization and have no record of immunization should receive PPSV23 vaccine. One-time revaccination 5 years after the first dose of PPSV23 is recommended for people aged 19-64 years who have chronic kidney failure, nephrotic syndrome, asplenia, or immunocompromised conditions. People who received 1-2 doses of PPSV23 before age 85 years should receive another dose of PPSV23 vaccine at age 59 years or later if at least 5 years have passed since the previous dose. Doses of PPSV23 are not needed for people immunized with PPSV23 at or after age 21 years.  Meningococcal vaccine. Adults with asplenia or persistent complement component deficiencies  should receive 2 doses of quadrivalent meningococcal conjugate (MenACWY-D) vaccine. The doses should be obtained at least 2 months apart. Microbiologists working with certain meningococcal bacteria, [employer] [place] recruits, people at risk during an outbreak, and people who  travel to or live in countries with a high rate of meningitis should be immunized. A first-year college student up through age 74 years who is living in a residence hall should receive a dose if she did not receive a dose on or after her 16th birthday. Adults who have certain high-risk conditions should receive one or more doses of vaccine.  Hepatitis A vaccine. Adults who wish to be protected from this disease, have certain high-risk conditions, work with hepatitis A-infected animals, work in hepatitis A research labs, or travel to or work in countries with a high rate of hepatitis A should be immunized. Adults who were previously unvaccinated and who anticipate close contact with an international adoptee during the first 60 days after arrival in the [place] from a country with a high rate of hepatitis A should be immunized.  Hepatitis B vaccine. Adults who wish to be protected from this disease, have certain high-risk conditions, may be exposed to blood or other infectious body fluids, are household contacts or sex partners of hepatitis B positive people, are clients or workers in certain care facilities, or travel to or work in countries with a high rate of hepatitis B should be immunized.  Haemophilus influenzae type b (Hib) vaccine. A previously unvaccinated person with asplenia or sickle cell disease or having a scheduled splenectomy should receive 1 dose of Hib vaccine. Regardless of previous immunization, a recipient of a hematopoietic stem cell transplant should receive a 3-dose series 6-12 months after her successful transplant. Hib vaccine is not recommended for adults with HIV infection. Preventive Services / Frequency Ages 7  to 19 years  Blood pressure check.** / Every 1 to 2 years.  Lipid and cholesterol check.** / Every 5 years beginning at age 26.  Clinical breast exam.** / Every 3 years for women in their 97s and 72s.  BRCA-related cancer risk assessment.** / For women who have family members with a BRCA-related cancer (breast, ovarian, tubal, or peritoneal cancers).  Pap test.** / Every 2 years from ages 26 through 42. Every 3 years starting at age 21 through age 66 or 52 with a history of 3 consecutive normal Pap tests.  HPV screening.** / Every 3 years from ages 32 through ages 68 to 50 with a history of 3 consecutive normal Pap tests.  Hepatitis C blood test.** / For any individual with known risks for hepatitis C.  Skin self-exam. / Monthly.  Influenza vaccine. / Every year.  Tetanus, diphtheria, and acellular pertussis (Tdap, Td) vaccine.** / Consult your health care provider. Pregnant women should receive 1 dose of Tdap vaccine during each pregnancy. 1 dose of Td every 10 years.  Varicella vaccine.** / Consult your health care provider. Pregnant females who do not have evidence of immunity should receive the first dose after pregnancy.  HPV vaccine. / 3 doses over 6 months, if 50 and younger. The vaccine is not recommended for use in pregnant females. However, pregnancy testing is not needed before receiving a dose.  Measles, mumps, rubella (MMR) vaccine.** / You need at least 1 dose of MMR if you were born in 1957 or later. You may also need a 2nd dose. For females of childbearing age, rubella immunity should be determined. If there is no evidence of immunity, females who are not pregnant should be vaccinated. If there is no evidence of immunity, females who are pregnant should delay immunization until after pregnancy.  Pneumococcal 13-valent conjugate (PCV13) vaccine.** / Consult your health care provider.  Pneumococcal polysaccharide (PPSV23) vaccine.** / 1 to 2 doses if you smoke cigarettes  or if you have certain conditions.  Meningococcal vaccine.** / 1 dose if you are age 15 to 44 years and a Market researcher living in a residence hall, or have one of several medical conditions, you need to get vaccinated against meningococcal disease. You may also need additional booster doses.  Hepatitis A vaccine.** / Consult your health care provider.  Hepatitis B vaccine.** / Consult your health care provider.  Haemophilus influenzae type b (Hib) vaccine.** / Consult your health care provider. Ages 64 to 26 years  Blood pressure check.** / Every 1 to 2 years.  Lipid and cholesterol check.** / Every 5 years beginning at age 71 years.  Lung cancer screening. / Every year if you are aged 73-80 years and have a 30-pack-year history of smoking and currently smoke or have quit within the past 15 years. Yearly screening is stopped once you have quit smoking for at least 15 years or develop a health problem that would prevent you from having lung cancer treatment.  Clinical breast exam.** / Every year after age 75 years.  BRCA-related cancer risk assessment.** / For women who have family members with a BRCA-related cancer (breast, ovarian, tubal, or peritoneal cancers).  Mammogram.** / Every year beginning at age 28 years and continuing for as long as you are in good health. Consult with your health care provider.  Pap test.** / Every 3 years starting at age 98 years through age 87 or 77 years with a history of 3 consecutive normal Pap tests.  HPV screening.** / Every 3 years from ages 27 years through ages 82 to 74 years with a history of 3 consecutive normal Pap tests.  Fecal occult blood test (FOBT) of stool. / Every year beginning at age 60 years and continuing until age 50 years. You may not need to do this test if you get a colonoscopy every 10 years.  Flexible sigmoidoscopy or colonoscopy.** / Every 5 years for a flexible sigmoidoscopy or every 10 years for a colonoscopy  beginning at age 67 years and continuing until age 47 years.  Hepatitis C blood test.** / For all people born from [birth year] through 1963 and any individual with known risks for hepatitis C.  Skin self-exam. / Monthly.  Influenza vaccine. / Every year.  Tetanus, diphtheria, and acellular pertussis (Tdap/Td) vaccine.** / Consult your health care provider. Pregnant women should receive 1 dose of Tdap vaccine during each pregnancy. 1 dose of Td every 10 years.  Varicella vaccine.** / Consult your health care provider. Pregnant females who do not have evidence of immunity should receive the first dose after pregnancy.  Zoster vaccine.** / 1 dose for adults aged 27 years or older.  Measles, mumps, rubella (MMR) vaccine.** / You need at least 1 dose of MMR if you were born in 1955 or later. You may also need a 2nd dose. For females of childbearing age, rubella immunity should be determined. If there is no evidence of immunity, females who are not pregnant should be vaccinated. If there is no evidence of immunity, females who are pregnant should delay immunization until after pregnancy.  Pneumococcal 13-valent conjugate (PCV13) vaccine.** / Consult your health care provider.  Pneumococcal polysaccharide (PPSV23) vaccine.** / 1 to 2 doses if you smoke cigarettes or if you have certain conditions.  Meningococcal vaccine.** / Consult your health care provider.  Hepatitis A vaccine.** / Consult your health care provider.  Hepatitis  B vaccine.** / Consult your health care provider.  Haemophilus influenzae type b (Hib) vaccine.** / Consult your health care provider. Ages 49 years and over  Blood pressure check.** / Every 1 to 2 years.  Lipid and cholesterol check.** / Every 5 years beginning at age 38 years.  Lung cancer screening. / Every year if you are aged 24-80 years and have a 30-pack-year history of smoking and currently smoke or have quit within the past 15 years. Yearly screening is stopped  once you have quit smoking for at least 15 years or develop a health problem that would prevent you from having lung cancer treatment.  Clinical breast exam.** / Every year after age 38 years.  BRCA-related cancer risk assessment.** / For women who have family members with a BRCA-related cancer (breast, ovarian, tubal, or peritoneal cancers).  Mammogram.** / Every year beginning at age 107 years and continuing for as long as you are in good health. Consult with your health care provider.  Pap test.** / Every 3 years starting at age 11 years through age 34 or 46 years with 3 consecutive normal Pap tests. Testing can be stopped between 65 and 70 years with 3 consecutive normal Pap tests and no abnormal Pap or HPV tests in the past 10 years.  HPV screening.** / Every 3 years from ages 36 years through ages 34 or 64 years with a history of 3 consecutive normal Pap tests. Testing can be stopped between 65 and 70 years with 3 consecutive normal Pap tests and no abnormal Pap or HPV tests in the past 10 years.  Fecal occult blood test (FOBT) of stool. / Every year beginning at age 19 years and continuing until age 73 years. You may not need to do this test if you get a colonoscopy every 10 years.  Flexible sigmoidoscopy or colonoscopy.** / Every 5 years for a flexible sigmoidoscopy or every 10 years for a colonoscopy beginning at age 30 years and continuing until age 68 years.  Hepatitis C blood test.** / For all people born from [birth year] through 1963 and any individual with known risks for hepatitis C.  Osteoporosis screening.** / A one-time screening for women ages 19 years and over and women at risk for fractures or osteoporosis.  Skin self-exam. / Monthly.  Influenza vaccine. / Every year.  Tetanus, diphtheria, and acellular pertussis (Tdap/Td) vaccine.** / 1 dose of Td every 10 years.  Varicella vaccine.** / Consult your health care provider.  Zoster vaccine.** / 1 dose for adults aged 22 years  or older.  Pneumococcal 13-valent conjugate (PCV13) vaccine.** / Consult your health care provider.  Pneumococcal polysaccharide (PPSV23) vaccine.** / 1 dose for all adults aged 44 years and older.  Meningococcal vaccine.** / Consult your health care provider.  Hepatitis A vaccine.** / Consult your health care provider.  Hepatitis B vaccine.** / Consult your health care provider.  Haemophilus influenzae type b (Hib) vaccine.** / Consult your health care provider. ** Family history and personal history of risk and conditions may change your health care provider's recommendations. Document Released: 06/23/2001 Document Revised: 09/11/2013 Document Reviewed: 09/22/2010 [facility] Patient Information 2015 [place], [place]. This information is not intended to replace advice given to you by your health care provider. Make sure you discuss any questions you have with your health care provider.   Fall Prevention and Home Safety Falls cause injuries and can affect all age groups. It is possible to use preventive measures to significantly decrease the likelihood of falls. There  are many simple measures which can make your home safer and prevent falls. OUTDOORS  Repair cracks and edges of walkways and driveways.  Remove high doorway thresholds.  Trim shrubbery on the main path into your home.  Have good outside lighting.  Clear walkways of tools, rocks, debris, and clutter.  Check that handrails are not broken and are securely fastened. Both sides of steps should have handrails.  Have leaves, snow, and ice cleared regularly.  Use sand or salt on walkways during winter months.  In the garage, clean up grease or oil spills. BATHROOM  Install night lights.  Install grab bars by the toilet and in the tub and shower.  Use non-skid mats or decals in the tub or shower.  Place a plastic non-slip stool in the shower to sit on, if needed.  Keep floors dry and clean up all water on the floor  immediately.  Remove soap buildup in the tub or shower on a regular basis.  Secure bath mats with non-slip, double-sided rug tape.  Remove throw rugs and tripping hazards from the floors. BEDROOMS  Install night lights.  Make sure a bedside light is easy to reach.  Do not use oversized bedding.  Keep a telephone by your bedside.  Have a firm chair with side arms to use for getting dressed.  Remove throw rugs and tripping hazards from the floor. KITCHEN  Keep handles on pots and pans turned toward the center of the stove. Use back burners when possible.  Clean up spills quickly and allow time for drying.  Avoid walking on wet floors.  Avoid hot utensils and knives.  Position shelves so they are not too high or low.  Place commonly used objects within easy reach.  If necessary, use a sturdy step stool with a grab bar when reaching.  Keep electrical cables out of the way.  Do not use floor polish or wax that makes floors slippery. If you must use wax, use non-skid floor wax.  Remove throw rugs and tripping hazards from the floor. STAIRWAYS  Never leave objects on stairs.  Place handrails on both sides of stairways and use them. Fix any loose handrails. Make sure handrails on both sides of the stairways are as long as the stairs.  Check carpeting to make sure it is firmly attached along stairs. Make repairs to worn or loose carpet promptly.  Avoid placing throw rugs at the top or bottom of stairways, or properly secure the rug with carpet tape to prevent slippage. Get rid of throw rugs, if possible.  Have an electrician put in a light switch at the top and bottom of the stairs. OTHER FALL PREVENTION TIPS  Wear low-heel or rubber-soled shoes that are supportive and fit well. Wear closed toe shoes.  When using a stepladder, make sure it is fully opened and both spreaders are firmly locked. Do not climb a closed stepladder.  Add color or contrast paint or tape to  grab bars and handrails in your home. Place contrasting color strips on first and last steps.  Learn and use mobility aids as needed. Install an electrical emergency response system.  Turn on lights to avoid dark areas. Replace light bulbs that burn out immediately. Get light switches that glow.  Arrange furniture to create clear pathways. Keep furniture in the same place.  Firmly attach carpet with non-skid or double-sided tape.  Eliminate uneven floor surfaces.  Select a carpet pattern that does not visually hide the edge of steps.  Be aware of all pets. OTHER HOME SAFETY TIPS  Set the water temperature for 120 F (48.8 C).  Keep emergency numbers on or near the telephone.  Keep smoke detectors on every level of the home and near sleeping areas. Document Released: 04/17/2002 Document Revised: 10/27/2011 Document Reviewed: 07/17/2011 [facility] Patient Information 2015 [place], [place]. This information is not intended to replace advice given to you by your health care provider. Make sure you discuss any questions you have with your health care provider.

## 2014-09-05 ENCOUNTER — Other Ambulatory Visit: Payer: Self-pay | Admitting: Family Medicine

## 2014-09-17 ENCOUNTER — Ambulatory Visit (INDEPENDENT_AMBULATORY_CARE_PROVIDER_SITE_OTHER): Payer: Medicare Other | Admitting: Family Medicine

## 2014-09-17 ENCOUNTER — Encounter: Payer: Self-pay | Admitting: Family Medicine

## 2014-09-17 VITALS — BP 151/69 | HR 75 | Temp 98.4°F | Ht 62.0 in | Wt 135.0 lb

## 2014-09-17 DIAGNOSIS — E785 Hyperlipidemia, unspecified: Secondary | ICD-10-CM | POA: Diagnosis not present

## 2014-09-17 DIAGNOSIS — F4323 Adjustment disorder with mixed anxiety and depressed mood: Secondary | ICD-10-CM | POA: Diagnosis not present

## 2014-09-17 DIAGNOSIS — I10 Essential (primary) hypertension: Secondary | ICD-10-CM

## 2014-09-17 DIAGNOSIS — E559 Vitamin D deficiency, unspecified: Secondary | ICD-10-CM | POA: Diagnosis not present

## 2014-09-17 DIAGNOSIS — R251 Tremor, unspecified: Secondary | ICD-10-CM | POA: Diagnosis not present

## 2014-09-17 LAB — POCT CBC
Granulocyte percent: 55.5 %G (ref 37–80)
HCT, POC: 42.1 % (ref 37.7–47.9)
HEMOGLOBIN: 13.5 g/dL (ref 12.2–16.2)
Lymph, poc: 2 (ref 0.6–3.4)
MCH: 27.8 pg (ref 27–31.2)
MCHC: 32 g/dL (ref 31.8–35.4)
MCV: 86.9 fL (ref 80–97)
MPV: 8.4 fL (ref 0–99.8)
POC Granulocyte: 2.8 (ref 2–6.9)
POC LYMPH PERCENT: 40.4 %L (ref 10–50)
Platelet Count, POC: 244 10*3/uL (ref 142–424)
RBC: 4.84 M/uL (ref 4.04–5.48)
RDW, POC: 13.4 %
WBC: 5 10*3/uL (ref 4.6–10.2)

## 2014-09-17 NOTE — Progress Notes (Signed)
Subjective:    Patient ID: Rachel Walton, female    DOB: [birth year], 81 y.o.   MRN: [id number]  HPI Pt here for follow up and management of chronic medical problems which includes hypertension and hypertension. She is taking medications regularly. The patient is dealing with the terminal illness of her son has a brain tumor. She complains today of a cyst on her right foot and also of a headache that seems to be there since she fell and hit her head a couple weeks ago. The patient is due to get lab work today and will be given an FOBT to return.     Patient Active Problem List   Diagnosis Date Noted  . Esophageal stricture 12/11/2013  . Allergic rhinitis due to pollen 12/11/2013  . Essential hypertension 12/11/2013  . Generalized anxiety disorder 08/21/2013  . Gall stones 06/15/2013  . Hyperlipemia 11/21/2012  . Tremor   . Postmenopausal   . Colon polyp   . Osteopenia of the elderly   . Kyphosis   . Recurrent UTI   . Atrophic vaginitis   . Diverticulosis   . Meningitis, history of    Outpatient Encounter Prescriptions as of 09/17/2014  Medication Sig  . Cholecalciferol (VITAMIN D3) 2000 UNITS TABS Take 1 tablet by mouth 2 (two) times a week. As needed  . clonazePAM (KLONOPIN) 0.5 MG tablet TAKE ONE TABLET TWICE DAILY AS NEEDED  . fish oil-omega-3 fatty acids 1000 MG capsule Take 2 g by mouth daily.  . fluticasone (FLONASE) 50 MCG/ACT nasal spray as needed.   Marland Kitchen lisinopril (PRINIVIL,ZESTRIL) 5 MG tablet TAKE ONE (1) TABLET EACH DAY  . Multiple Vitamins-Minerals (ICAPS AREDS FORMULA PO) Take 1 tablet by mouth 2 (two) times daily.  . naphazoline-pheniramine (NAPHCON-A) 0.025-0.3 % ophthalmic solution 3 (three) times a week.  Marland Kitchen omeprazole (PRILOSEC) 40 MG capsule Take 40 mg by mouth daily as needed.   . polyvinyl alcohol-povidone (REFRESH) 1.4-0.6 % ophthalmic solution Place 1-2 drops into both eyes 2 (two) times daily.   . rosuvastatin (CRESTOR) 5 MG tablet Take 5 mg by mouth 3  (three) times a week.   . sodium chloride (OCEAN) 0.65 % SOLN nasal spray Place 1 spray into both nostrils as needed for congestion.  . Triamcinolone Acetonide (NASACORT ALLERGY 24HR NA) Place into the nose daily as needed.   . [DISCONTINUED] levofloxacin (LEVAQUIN) 500 MG tablet Take 1 tablet (500 mg total) by mouth daily. (Patient not taking: Reported on 08/30/2014)   No facility-administered encounter medications on file as of 09/17/2014.     Review of Systems  Constitutional: Negative.   Eyes: Negative.   Respiratory: Negative.   Cardiovascular: Negative.   Gastrointestinal: Negative.   Endocrine: Negative.   Genitourinary: Negative.   Musculoskeletal: Negative.   Skin: Negative.        Right foot knot / cyst  Allergic/Immunologic: Negative.   Neurological: Positive for headaches (hit head 2 weeks ago ).  Hematological: Negative.   Psychiatric/Behavioral: Negative.        Objective:   Physical Exam  Constitutional: She is oriented to person, place, and time. She appears well-developed and well-nourished. She appears distressed.  The patient is obviously distressed regarding the terminal illness of her son who has a brain tumor.  HENT:  Head: Normocephalic and atraumatic.  Right Ear: External ear normal.  Left Ear: External ear normal.  Mouth/Throat: Oropharynx is clear and moist. No oropharyngeal exudate.  Nasal congestion bilaterally  Eyes: Conjunctivae and EOM are  normal. Pupils are equal, round, and reactive to light. Right eye exhibits no discharge. Left eye exhibits no discharge. No scleral icterus.  The optic nerve appears normal bilaterally and pupils were equal round and reactive to light and accommodation bilaterally  Neck: Normal range of motion. Neck supple. No thyromegaly present.  No thyromegaly bruits or adenopathy  Cardiovascular: Normal rate, regular rhythm and normal heart sounds.   No murmur heard. At 72/m  Pulmonary/Chest: Effort normal and breath sounds  normal. No respiratory distress. She has no wheezes. She has no rales. She exhibits no tenderness.  Clear anteriorly and posteriorly  Abdominal: Soft. Bowel sounds are normal. She exhibits no mass. There is no tenderness. There is no rebound and no guarding.  Nontender without masses or organ enlargement  Musculoskeletal: Normal range of motion. She exhibits no edema.  There is a cyst on the lateral aspect of the right foot that is soft and non-inflamed and nondraining  Lymphadenopathy:    She has no cervical adenopathy.  Neurological: She is alert and oriented to person, place, and time. She has normal reflexes. No cranial nerve deficit.  The patient continues to have a head tremor which appears to be no worse than it has been in the past and she is followed by the neurologist for this in the past.  Skin: Skin is warm and dry. No rash noted.  Psychiatric: She has a normal mood and affect. Her behavior is normal. Judgment and thought content normal.  Nursing note and vitals reviewed.  BP 151/69 mmHg  Pulse 75  Temp(Src) 98.4 F (36.9 C) (Oral)  Ht $R'5\' 2"'So$  (1.575 m)  Wt 135 lb (61.236 kg)  BMI 24.69 kg/m2        Assessment & Plan:  1. Hyperlipidemia -The patient should continue with current treatment pending results of lab work today - POCT CBC - NMR, lipoprofile  2. Essential hypertension -The blood pressure medications will not be changed in light of the fact that the patient is stressed dealing with a son who has been put on hospice - POCT CBC - BMP8+EGFR - Hepatic function panel  3. Vitamin D deficiency -Continue current treatment pending results of lab work - POCT CBC - Vit D  25 hydroxy (rtn osteoporosis monitoring)  4. Situational mixed anxiety and depressive disorder -We spent a good period of time discussing the signs brain tumor and what could've caused this. As a mother she is having trouble dealing with this situation but does have a lot of faith and believes that  her son will be better off once the terminal event occurs  5. Tremor -This is an ongoing problem and it has not gotten any worse.  Patient Instructions                       Medicare Annual Wellness Visit  [facility] and the medical providers at [facility] strive to bring you the best medical care.  In doing so we not only want to address your current medical conditions and concerns but also to detect new conditions early and prevent illness, disease and health-related problems.    Medicare offers a yearly Wellness Visit which allows our clinical staff to assess your need for preventative services including immunizations, lifestyle education, counseling to decrease risk of preventable diseases and screening for fall risk and other medical concerns.    This visit is provided free of charge (no copay) for all Medicare recipients. The clinical  pharmacists at [facility] have begun to conduct these Wellness Visits which will also include a thorough review of all your medications.    As you primary medical provider recommend that you make an appointment for your Annual Wellness Visit if you have not done so already this year.  You may set up this appointment before you leave today or you may call back ([phone]) and schedule an appointment.  Please make sure when you call that you mention that you are scheduling your Annual Wellness Visit with the clinical pharmacist so that the appointment may be made for the proper length of time.     Continue current medications. Continue good therapeutic lifestyle changes which include good diet and exercise. Fall precautions discussed with patient. If an FOBT was given today- please return it to our front desk. If you are over 50 years old - you may need Prevnar 26 or the adult Pneumonia vaccine.  Flu Shots are still available at our office. If you still haven't had one please call to set up a nurse visit to get  one.   After your visit with [place] today you will receive a survey in the mail or online from [employer] regarding your care with [place]. Please take a moment to fill this out. Your feedback is very important to [place] as you can help [place] better understand your patient needs as well as improve your experience and satisfaction. WE CARE ABOUT YOU!!!   At your convenience, let us know and we will make an appointment for you to see the dermatologist regarding the skin changes below the right eye If the cystic area on your right foot becomes painful or irritated, we will arrange for an orthopedic surgeon to remove this otherwise continue to watch this cyst and make sure your shoes are always wide to not put pressure on the cyst We will keep you and your family in our prayers because of your sons medical issues   Arrie Senate MD

## 2014-09-17 NOTE — Patient Instructions (Addendum)
Medicare Annual Wellness Visit  [facility] and the medical providers at [facility] strive to bring you the best medical care.  In doing so we not only want to address your current medical conditions and concerns but also to detect new conditions early and prevent illness, disease and health-related problems.    Medicare offers a yearly Wellness Visit which allows our clinical staff to assess your need for preventative services including immunizations, lifestyle education, counseling to decrease risk of preventable diseases and screening for fall risk and other medical concerns.    This visit is provided free of charge (no copay) for all Medicare recipients. The clinical pharmacists at [facility] have begun to conduct these Wellness Visits which will also include a thorough review of all your medications.    As you primary medical provider recommend that you make an appointment for your Annual Wellness Visit if you have not done so already this year.  You may set up this appointment before you leave today or you may call back ([phone]) and schedule an appointment.  Please make sure when you call that you mention that you are scheduling your Annual Wellness Visit with the clinical pharmacist so that the appointment may be made for the proper length of time.     Continue current medications. Continue good therapeutic lifestyle changes which include good diet and exercise. Fall precautions discussed with patient. If an FOBT was given today- please return it to our front desk. If you are over 36 years old - you may need Prevnar 61 or the adult Pneumonia vaccine.  Flu Shots are still available at our office. If you still haven't had one please call to set up a nurse visit to get one.   After your visit with [place] today you will receive a survey in the mail or online from [employer] regarding your care with [place]. Please take a moment to  fill this out. Your feedback is very important to [place] as you can help [place] better understand your patient needs as well as improve your experience and satisfaction. WE CARE ABOUT YOU!!!   At your convenience, let us know and we will make an appointment for you to see the dermatologist regarding the skin changes below the right eye If the cystic area on your right foot becomes painful or irritated, we will arrange for an orthopedic surgeon to remove this otherwise continue to watch this cyst and make sure your shoes are always wide to not put pressure on the cyst We will keep you and your family in our prayers because of your sons medical issues

## 2014-09-18 LAB — NMR, LIPOPROFILE
Cholesterol: 241 mg/dL — ABNORMAL HIGH (ref 100–199)
HDL CHOLESTEROL BY NMR: 85 mg/dL (ref >39)
HDL Particle Number: 44.6 umol/L (ref >=30.5)
LDL Particle Number: 1222 nmol/L — ABNORMAL HIGH (ref <1000)
LDL SIZE: 21.9 nm (ref >20.5)
LDL-C: 109 mg/dL — ABNORMAL HIGH (ref 0–99)
LP-IR Score: 33 (ref <=45)
Small LDL Particle Number: <90 nmol/L (ref <=527)
Triglycerides by NMR: 237 mg/dL — ABNORMAL HIGH (ref 0–149)

## 2014-09-18 LAB — BMP8+EGFR
BUN / CREAT RATIO: 6 — AB (ref 11–26)
BUN: 5 mg/dL — ABNORMAL LOW (ref 8–27)
CHLORIDE: 102 mmol/L (ref 97–108)
CO2: 24 mmol/L (ref 18–29)
CREATININE: 0.85 mg/dL (ref 0.57–1.00)
Calcium: 9.1 mg/dL (ref 8.7–10.3)
GFR calc Af Amer: 74 mL/min/{1.73_m2} (ref >59)
GFR calc non Af Amer: 64 mL/min/{1.73_m2} (ref >59)
GLUCOSE: 96 mg/dL (ref 65–99)
Potassium: 5 mmol/L (ref 3.5–5.2)
Sodium: 143 mmol/L (ref 134–144)

## 2014-09-18 LAB — HEPATIC FUNCTION PANEL
ALT: 22 IU/L (ref 0–32)
AST: 18 IU/L (ref 0–40)
Albumin: 4 g/dL (ref 3.5–4.7)
Alkaline Phosphatase: 63 IU/L (ref 39–117)
BILIRUBIN, DIRECT: 0.11 mg/dL (ref 0.00–0.40)
Bilirubin Total: 0.4 mg/dL (ref 0.0–1.2)
Total Protein: 6.1 g/dL (ref 6.0–8.5)

## 2014-09-18 LAB — VITAMIN D 25 HYDROXY (VIT D DEFICIENCY, FRACTURES): Vit D, 25-Hydroxy: 22 ng/mL — ABNORMAL LOW (ref 30.0–100.0)

## 2014-09-27 ENCOUNTER — Encounter: Payer: Self-pay | Admitting: *Deleted

## 2014-10-09 ENCOUNTER — Encounter: Payer: Self-pay | Admitting: *Deleted

## 2014-10-16 ENCOUNTER — Other Ambulatory Visit: Payer: Medicare Other

## 2014-10-16 DIAGNOSIS — Z1212 Encounter for screening for malignant neoplasm of rectum: Secondary | ICD-10-CM

## 2014-10-16 NOTE — Progress Notes (Signed)
Lab only 

## 2014-10-17 LAB — FECAL OCCULT BLOOD, IMMUNOCHEMICAL: Fecal Occult Bld: NEGATIVE

## 2014-12-05 ENCOUNTER — Encounter: Payer: Self-pay | Admitting: Internal Medicine

## 2014-12-05 ENCOUNTER — Ambulatory Visit (INDEPENDENT_AMBULATORY_CARE_PROVIDER_SITE_OTHER): Payer: Medicare Other | Admitting: Internal Medicine

## 2014-12-05 VITALS — BP 124/70 | HR 72 | Ht 62.0 in | Wt 135.0 lb

## 2014-12-05 DIAGNOSIS — K253 Acute gastric ulcer without hemorrhage or perforation: Secondary | ICD-10-CM

## 2014-12-05 DIAGNOSIS — R197 Diarrhea, unspecified: Secondary | ICD-10-CM

## 2014-12-05 DIAGNOSIS — Z800 Family history of malignant neoplasm of digestive organs: Secondary | ICD-10-CM

## 2014-12-05 DIAGNOSIS — K222 Esophageal obstruction: Secondary | ICD-10-CM | POA: Diagnosis not present

## 2014-12-05 MED ORDER — CLONAZEPAM 0.5 MG PO TABS: 0.5000 mg | ORAL_TABLET | Freq: Two times a day (BID) | ORAL | Status: DC | PRN

## 2014-12-05 MED ORDER — DICYCLOMINE HCL 10 MG PO CAPS: 10.0000 mg | ORAL_CAPSULE | Freq: Two times a day (BID) | ORAL | Status: DC

## 2014-12-05 NOTE — Patient Instructions (Addendum)
We have sent the following medications to your pharmacy for you to pick up at your convenience: Klonopin Bentyl  CC: Dr Morrie Sheldon

## 2014-12-05 NOTE — Progress Notes (Signed)
Rachel Walton August 05, 1929 [id number]  Note: This dictation was prepared with Dragon digital system. Any transcriptional errors that result from this procedure are unintentional.   History of Present Illness: This is a 81 year old white female with history of gastric ulcer and irritable bowel syndrome. She is here today because of the change in bowel habits toward diarrhea. She has  leakage of stool at times. She denies rectal bleeding or abdominal pain. She describes explosive bowel movements. Colonoscopy  in June 2015 showed mild diverticulosis of the left colon. There is a family history of colon cancer in her brother. She underwent prior colonoscopy in 2000 and 2003 when adenomatous polyp was removed, subsequent  colonoscopy in 2009 was normal. She has a history of fatty liver and gallstones on ultrasound in December 2014 and she saw Dr. Lucia Gaskins regarding lap chole  but decision was made to  observe because the gallbladder wall was only  1.1 mm and common bile duct was 4 mm. Upper endoscopy in May 2015 and again in July 2015 showed 90% healing of the large antral ulcer. She also had 2 cm hiatal hernia with mild esophageal stricture which was dilated with 16 mm dilator. Patient describes loose stools during the day but none at night. She has been under stressful last 6 months because her son passed away with the brain tumor just 2 months ago. She has Klonopin 0.5 mg to take twice a day for tremors but doesn't really take it    Past Medical History  Diagnosis Date  . Tremor   . Postmenopausal   . Hx of adenomatous colonic polyps   . Kyphosis   . Recurrent UTI   . Atrophic vaginitis   . Diverticulosis   . Meningitis   . Cataract   . Hyperlipidemia   . Diverticulosis   . Osteopenia of the elderly   . Macular degeneration   . Breast cyst     Past Surgical History  Procedure Laterality Date  . Total abdominal hysterectomy w/ bilateral salpingoophorectomy    . Back surgery    .  Appendectomy    . Cataract surgery    . Eye surgery    . Breast surgery      cyst removal  . Abdominal hysterectomy  1980    Allergies  Allergen Reactions  . Alendronate Sodium Other (See Comments)    Caused chest pain  . Amoxicillin     875 mg only . Can take when lesser strength  . Cephalexin   . Prednisone Other (See Comments)    Reaction is unknown..then states nervousness  . Sudafed [Pseudoephedrine Hcl] Other (See Comments)    insomnia  . Sulfa Antibiotics Hives  . Toprol Xl [Metoprolol Succinate] Other (See Comments)    Unknown reaction  . Trimethoprim     Unknown reaction    Family history and social history have been reviewed.  Review of Systems: Loose stools. Some small amount of fall stool leaking. No weight loss  The remainder of the 10 point ROS is negative except as outlined in the H&P  Physical Exam: General Appearance Well developed, in no distress Eyes  Non icteric  HEENT  Non traumatic, normocephalic  Mouth No lesion, tongue papillated, no cheilosis Neck Supple without adenopathy, thyroid not enlarged, no carotid bruits, no JVD Lungs Clear to auscultation bilaterally COR Normal S1, normal S2, regular rhythm, no murmur, quiet precordium Abdomen soft, nontender. Normoactive bowel sounds. No tenderness. Rectal normal rectal sphincter tone. Small amount of loose  Hemoccult-negative stool Extremities  No pedal edema Skin No lesions Neurological Alert and oriented x 3 Psychological Normal mood and affect  Assessment and Plan:   81 year old white female with what sounds like an irritable bowel syndrome with predominant  diarrhea likely associated with recent stress of death of her son. We will start Bentyl 10 mg twice a day. She will start on Benefiber 1 heaping teaspoon daily to bulk up the stool and she will start taking Klonopin 0.5 mg every morning and possibly another 0.5 mg at night.  Family history of colon cancer she is up-to-date on colonoscopy.  No further recall necessary because of her age  Gallstones. Asymptomatic ??  Gastroesophageal reflux disease hiatal hernia and nonobstructing stricture. Asymptomatic. History of gastric ulcer healed up. She has discontinued PPIs which were causing diarrhea    Rachel Walton 12/05/2014

## 2014-12-21 ENCOUNTER — Ambulatory Visit (INDEPENDENT_AMBULATORY_CARE_PROVIDER_SITE_OTHER): Payer: Medicare Other | Admitting: Family Medicine

## 2014-12-21 ENCOUNTER — Encounter: Payer: Self-pay | Admitting: Family Medicine

## 2014-12-21 VITALS — BP 143/64 | HR 66 | Temp 97.0°F | Ht 62.0 in | Wt 131.0 lb

## 2014-12-21 DIAGNOSIS — R531 Weakness: Secondary | ICD-10-CM

## 2014-12-21 DIAGNOSIS — R197 Diarrhea, unspecified: Secondary | ICD-10-CM | POA: Diagnosis not present

## 2014-12-21 DIAGNOSIS — R195 Other fecal abnormalities: Secondary | ICD-10-CM

## 2014-12-21 MED ORDER — ROSUVASTATIN CALCIUM 5 MG PO TABS: 5.0000 mg | ORAL_TABLET | Freq: Every day | ORAL | Status: DC

## 2014-12-21 NOTE — Patient Instructions (Addendum)
Hold milk and dairy products. Take Align Probiotics over the counter. Take Bentyl regularly. Avoid caffeine

## 2014-12-21 NOTE — Progress Notes (Signed)
Subjective:    Patient ID: Rachel Walton, female    DOB: November 12, 1929, 81 y.o.   MRN: [id number]  HPI  Patient comes in today with complaint of frequent diarrhea after meals and at times she has some bowel incontinence. This is accompanied by some mild abdominal pain and bloating. She saw her gastroenterologist, Dr Olevia Perches, on 12/05/14 and was prescribed Bentyl. She took one dose and was hesitant to take anymore. She did not notice any improvement of symptoms nor did she have any negative side effects. The patient has also been under a lot of stress and she's had loved ones who have passed away most importantly her son. He died of a brain tumor. She is having 3-4 loose bowel movements a day and is complaining of weakness. He was given a day until but has only taken 1 by mouth. She did not have any problems with this. She does not drink any caffeine.  Patient Active Problem List   Diagnosis Date Noted  . Esophageal stricture 12/11/2013  . Allergic rhinitis due to pollen 12/11/2013  . Essential hypertension 12/11/2013  . Generalized anxiety disorder 08/21/2013  . Gall stones 06/15/2013  . Hyperlipemia 11/21/2012  . Tremor   . Postmenopausal   . Colon polyp   . Osteopenia of the elderly   . Kyphosis   . Recurrent UTI   . Atrophic vaginitis   . Diverticulosis   . Meningitis, history of    Outpatient Encounter Prescriptions as of 12/21/2014  Medication Sig  . Cholecalciferol (VITAMIN D3) 2000 UNITS TABS Take 1 tablet by mouth 2 (two) times a week. As needed  . clonazePAM (KLONOPIN) 0.5 MG tablet Take 1 tablet (0.5 mg total) by mouth 2 (two) times daily as needed (stress).  . fish oil-omega-3 fatty acids 1000 MG capsule Take 2 g by mouth daily.  . fluticasone (FLONASE) 50 MCG/ACT nasal spray as needed.   Marland Kitchen lisinopril (PRINIVIL,ZESTRIL) 5 MG tablet TAKE ONE (1) TABLET EACH DAY  . Multiple Vitamins-Minerals (ICAPS AREDS FORMULA PO) Take 1 tablet by mouth once.   . naphazoline-pheniramine  (NAPHCON-A) 0.025-0.3 % ophthalmic solution as needed.   . polyvinyl alcohol-povidone (REFRESH) 1.4-0.6 % ophthalmic solution Place 1-2 drops into both eyes as needed.   . rosuvastatin (CRESTOR) 5 MG tablet Take 5 mg by mouth 3 (three) times a week.   . sodium chloride (OCEAN) 0.65 % SOLN nasal spray Place 1 spray into both nostrils as needed for congestion.  . Triamcinolone Acetonide (NASACORT ALLERGY 24HR NA) Place into the nose daily as needed.   . dicyclomine (BENTYL) 10 MG capsule Take 1 capsule (10 mg total) by mouth 2 (two) times daily. (Patient not taking: Reported on 12/21/2014)  . omeprazole (PRILOSEC) 40 MG capsule Take 40 mg by mouth daily as needed.    No facility-administered encounter medications on file as of 12/21/2014.     Review of Systems  Constitutional: Positive for fatigue.  HENT: Negative.   Eyes: Negative.   Respiratory: Negative.   Cardiovascular: Negative.   Gastrointestinal: Positive for abdominal pain, diarrhea and abdominal distention. Negative for nausea, constipation and blood in stool.  Endocrine: Negative.   Genitourinary: Negative.   Musculoskeletal: Negative.   Skin: Negative.   Allergic/Immunologic: Negative.   Neurological: Negative.   Hematological: Negative.   Psychiatric/Behavioral: Negative.        Objective:   Physical Exam  Constitutional: She is oriented to person, place, and time. She appears well-developed and well-nourished. No distress.  The  patient has had several losses recently most importantly her son from a brain tumor another relative from a brain tumor and she has been under a lot of stress.  HENT:  Head: Normocephalic and atraumatic.  Eyes: Conjunctivae and EOM are normal. Pupils are equal, round, and reactive to light. Right eye exhibits no discharge. Left eye exhibits no discharge. No scleral icterus.  Neck: Normal range of motion. Neck supple. No thyromegaly present.  Cardiovascular: Normal rate and regular rhythm.   No  murmur heard. Pulmonary/Chest: Effort normal and breath sounds normal. No respiratory distress. She has no wheezes. She has no rales. She exhibits no tenderness.  Abdominal: Soft. Bowel sounds are normal. She exhibits no mass. There is no tenderness. There is no rebound and no guarding.  Musculoskeletal: Normal range of motion. She exhibits no edema.  Lymphadenopathy:    She has no cervical adenopathy.  Neurological: She is alert and oriented to person, place, and time.  Skin: Skin is warm and dry. No rash noted.  Psychiatric: She has a normal mood and affect. Her behavior is normal. Judgment and thought content normal.  Nursing note and vitals reviewed.   BP 143/64 mmHg  Pulse 66  Temp(Src) 97 F (36.1 C) (Oral)  Ht 5\' 2"  (1.575 m)  Wt 131 lb (59.421 kg)  BMI 23.95 kg/m2        Assessment & Plan:  1. Loose bowel movements -The patient will take the bentyl as prescribed by the gastroenterologist -I will also have her take align, the probiotic -She will watch her milk and dairy products -She will reduce her caffeine intake of which she is not drinking any she said -If no better in 2 weeks patient will call me back and we will discuss the case with the gastroenterologist  2. Weakness -BMP and CBC  Patient Instructions  Hold milk and dairy products. Take Align Probiotics over the counter. Take Bentyl regularly. Avoid caffeine   Arrie Senate MD

## 2014-12-22 LAB — CBC WITH DIFFERENTIAL/PLATELET
Basophils Absolute: 0.1 10*3/uL (ref 0.0–0.2)
Basos: 1 %
EOS (ABSOLUTE): 0.2 10*3/uL (ref 0.0–0.4)
EOS: 2 %
Hematocrit: 40.1 % (ref 34.0–46.6)
Hemoglobin: 13.4 g/dL (ref 11.1–15.9)
Immature Grans (Abs): 0 10*3/uL (ref 0.0–0.1)
Immature Granulocytes: 0 %
Lymphocytes Absolute: 2.8 10*3/uL (ref 0.7–3.1)
Lymphs: 45 %
MCH: 28.8 pg (ref 26.6–33.0)
MCHC: 33.4 g/dL (ref 31.5–35.7)
MCV: 86 fL (ref 79–97)
Monocytes Absolute: 0.5 10*3/uL (ref 0.1–0.9)
Monocytes: 7 %
NEUTROS PCT: 45 %
Neutrophils Absolute: 2.8 10*3/uL (ref 1.4–7.0)
PLATELETS: 250 10*3/uL (ref 150–379)
RBC: 4.66 x10E6/uL (ref 3.77–5.28)
RDW: 13.9 % (ref 12.3–15.4)
WBC: 6.3 10*3/uL (ref 3.4–10.8)

## 2014-12-22 LAB — BMP8+EGFR
BUN / CREAT RATIO: 8 — AB (ref 11–26)
BUN: 6 mg/dL — AB (ref 8–27)
CO2: 28 mmol/L (ref 18–29)
CREATININE: 0.78 mg/dL (ref 0.57–1.00)
Calcium: 9.1 mg/dL (ref 8.7–10.3)
Chloride: 101 mmol/L (ref 97–108)
GFR, EST AFRICAN AMERICAN: 82 mL/min/{1.73_m2} (ref >59)
GFR, EST NON AFRICAN AMERICAN: 71 mL/min/{1.73_m2} (ref >59)
GLUCOSE: 85 mg/dL (ref 65–99)
POTASSIUM: 4.2 mmol/L (ref 3.5–5.2)
Sodium: 142 mmol/L (ref 134–144)

## 2014-12-22 LAB — THYROID PANEL WITH TSH
Free Thyroxine Index: 2.4 (ref 1.2–4.9)
T3 Uptake Ratio: 27 % (ref 24–39)
T4, Total: 9 ug/dL (ref 4.5–12.0)
TSH: 1.91 u[IU]/mL (ref 0.450–4.500)

## 2015-01-04 ENCOUNTER — Ambulatory Visit: Payer: Self-pay | Admitting: Internal Medicine

## 2015-01-25 ENCOUNTER — Encounter: Payer: Self-pay | Admitting: Family Medicine

## 2015-01-25 ENCOUNTER — Ambulatory Visit (INDEPENDENT_AMBULATORY_CARE_PROVIDER_SITE_OTHER): Payer: Medicare Other | Admitting: Family Medicine

## 2015-01-25 VITALS — BP 154/80 | HR 66 | Temp 97.4°F | Ht 62.0 in | Wt 129.0 lb

## 2015-01-25 DIAGNOSIS — R1032 Left lower quadrant pain: Secondary | ICD-10-CM | POA: Diagnosis not present

## 2015-01-25 DIAGNOSIS — I10 Essential (primary) hypertension: Secondary | ICD-10-CM | POA: Diagnosis not present

## 2015-01-25 DIAGNOSIS — K589 Irritable bowel syndrome without diarrhea: Secondary | ICD-10-CM | POA: Diagnosis not present

## 2015-01-25 DIAGNOSIS — R399 Unspecified symptoms and signs involving the genitourinary system: Secondary | ICD-10-CM | POA: Diagnosis not present

## 2015-01-25 DIAGNOSIS — R202 Paresthesia of skin: Secondary | ICD-10-CM

## 2015-01-25 DIAGNOSIS — E785 Hyperlipidemia, unspecified: Secondary | ICD-10-CM

## 2015-01-25 DIAGNOSIS — E559 Vitamin D deficiency, unspecified: Secondary | ICD-10-CM

## 2015-01-25 DIAGNOSIS — R197 Diarrhea, unspecified: Secondary | ICD-10-CM | POA: Diagnosis not present

## 2015-01-25 LAB — POCT URINALYSIS DIPSTICK
BILIRUBIN UA: NEGATIVE
Blood, UA: NEGATIVE
GLUCOSE UA: NEGATIVE
Nitrite, UA: NEGATIVE
Protein, UA: NEGATIVE
SPEC GRAV UA: 1.01
Urobilinogen, UA: NEGATIVE
pH, UA: 6

## 2015-01-25 LAB — POCT UA - MICROSCOPIC ONLY
Bacteria, U Microscopic: NEGATIVE
CASTS, UR, LPF, POC: NEGATIVE
Crystals, Ur, HPF, POC: NEGATIVE
MUCUS UA: NEGATIVE
YEAST UA: NEGATIVE

## 2015-01-25 NOTE — Patient Instructions (Addendum)
Medicare Annual Wellness Visit  [facility] and the medical providers at [facility] strive to bring you the best medical care.  In doing so we not only want to address your current medical conditions and concerns but also to detect new conditions early and prevent illness, disease and health-related problems.    Medicare offers a yearly Wellness Visit which allows our clinical staff to assess your need for preventative services including immunizations, lifestyle education, counseling to decrease risk of preventable diseases and screening for fall risk and other medical concerns.    This visit is provided free of charge (no copay) for all Medicare recipients. The clinical pharmacists at [facility] have begun to conduct these Wellness Visits which will also include a thorough review of all your medications.    As you primary medical [facility] Walkins recommend that you make an appointment for your Annual Wellness Visit if you have not done so already this year.  You may set up this appointment before you leave today or you may call back ([phone]) and schedule an appointment.  Please make sure when you call that you mention that you are scheduling your Annual Wellness Visit with the clinical pharmacist so that the appointment may be made for the proper length of time.     Continue current medications. Continue good therapeutic lifestyle changes which include good diet and exercise. Fall precautions discussed with patient. If an FOBT was given today- please return it to our front desk. If you are over 63 years old - you may need Prevnar 11 or the adult Pneumonia vaccine.  **Flu shots will be available soon--- please call and schedule a FLU-CLINIC appointment**  After your visit with [place] today you will receive a survey in the mail or online from [employer] regarding your care with [place]. Please take a moment to fill this out. Your feedback is  very important to [place] as you can help [place] better understand your patient needs as well as improve your experience and satisfaction. WE CARE ABOUT YOU!!!   **Please join [place] SEPT.[email or web address], 2016 from 5:00 to 7:00pm for our OPEN HOUSE! Come out and meet our NEW providers**  the patient should follow-up with the gastroenterologist and the surgeon as planned  We will arrange for her to have a CT scan or ultrasound of her abdomen  We will also encourage her to take align , the probiotic that you can purchase over-the-counter and take one daily  Also to use Benefiber daily and avoid milk cheese ice cream and dairy products and caffeine in the diet  She should continue to drink plenty of water and fluids She is encouraged to exercise and walk more. She will increase her lisinopril to 10 mg daily and bring blood pressure readings by for review in 2-[redacted] weeks along with getting another BMP.

## 2015-01-25 NOTE — Addendum Note (Signed)
Addended by: Earlene Plater on: 01/25/2015 04:08 PM   Modules accepted: Orders

## 2015-01-25 NOTE — Progress Notes (Signed)
Subjective:    Patient ID: Rachel Walton, female    DOB: [birth year], 81 y.o.   MRN: [id number]  HPI Pt here for follow up and management of chronic medical problems which includes hypertension and hyperlipidemia. She is taking medications regularly. Patient still complains of loose bowel movements for 3 months. The patient saw the gastroenterologist the end of July. She thought that she had irritable bowel syndrome, diarrhea type. She prescribed Benefiber, and Klonopin and  Dicyclomine. She is no longer taking the dicyclomine. She has not been using the Benefiber regularly. She is currently not taking a probiotic. She is having 3-4 loose bowel movements daily and they were watery in nature. She denies any blood in the stool. The patient's electrolytes in August were good and this includes sodium chloride and potassium. She continues to take her lisinopril 5 mg for her blood pressure. There is a family history of hypertension with this patient. She also complains of tingling in her hands and feet.       Patient Active Problem List   Diagnosis Date Noted  . Esophageal stricture 12/11/2013  . Allergic rhinitis due to pollen 12/11/2013  . Essential hypertension 12/11/2013  . Generalized anxiety disorder 08/21/2013  . Gall stones 06/15/2013  . Hyperlipemia 11/21/2012  . Tremor   . Postmenopausal   . Colon polyp   . Osteopenia of the elderly   . Kyphosis   . Recurrent UTI   . Atrophic vaginitis   . Diverticulosis   . Meningitis, history of    Outpatient Encounter Prescriptions as of 01/25/2015  Medication Sig  . Cholecalciferol (VITAMIN D3) 2000 UNITS TABS Take 1 tablet by mouth 2 (two) times a week. As needed  . clonazePAM (KLONOPIN) 0.5 MG tablet Take 1 tablet (0.5 mg total) by mouth 2 (two) times daily as needed (stress).  Marland Kitchen dicyclomine (BENTYL) 10 MG capsule Take 1 capsule (10 mg total) by mouth 2 (two) times daily. (Patient not taking: Reported on 12/21/2014)  . fish oil-omega-3  fatty acids 1000 MG capsule Take 2 g by mouth daily.  . fluticasone (FLONASE) 50 MCG/ACT nasal spray as needed.   Marland Kitchen lisinopril (PRINIVIL,ZESTRIL) 5 MG tablet TAKE ONE (1) TABLET EACH DAY  . Multiple Vitamins-Minerals (ICAPS AREDS FORMULA PO) Take 1 tablet by mouth once.   . naphazoline-pheniramine (NAPHCON-A) 0.025-0.3 % ophthalmic solution as needed.   Marland Kitchen omeprazole (PRILOSEC) 40 MG capsule Take 40 mg by mouth daily as needed.   . polyvinyl alcohol-povidone (REFRESH) 1.4-0.6 % ophthalmic solution Place 1-2 drops into both eyes as needed.   . rosuvastatin (CRESTOR) 5 MG tablet Take 1 tablet (5 mg total) by mouth at bedtime.  . sodium chloride (OCEAN) 0.65 % SOLN nasal spray Place 1 spray into both nostrils as needed for congestion.  . Triamcinolone Acetonide (NASACORT ALLERGY 24HR NA) Place into the nose daily as needed.    No facility-administered encounter medications on file as of 01/25/2015.     Review of Systems  Constitutional: Negative.   HENT: Negative.   Eyes: Negative.   Respiratory: Negative.   Cardiovascular: Negative.   Gastrointestinal: Positive for diarrhea (x 3 months).       LLQ pain  Endocrine: Negative.   Genitourinary: Negative.   Musculoskeletal: Negative.   Skin: Negative.   Allergic/Immunologic: Negative.   Neurological: Negative.        Hands and feet - tingling sensation  Hematological: Negative.   Psychiatric/Behavioral: Negative.        Objective:  Physical Exam  Constitutional: She is oriented to person, place, and time. She appears well-developed and well-nourished.  HENT:  Head: Normocephalic and atraumatic.  Right Ear: External ear normal.  Left Ear: External ear normal.  Nose: Nose normal.  Mouth/Throat: Oropharynx is clear and moist.  Eyes: Conjunctivae and EOM are normal. Pupils are equal, round, and reactive to light. Right eye exhibits no discharge. Left eye exhibits no discharge. No scleral icterus.  Neck: Normal range of motion. Neck  supple. No thyromegaly present.  Without bruits or thyromegaly or anterior cervical adenopathy  Cardiovascular: Normal rate, regular rhythm, normal heart sounds and intact distal pulses.   No murmur heard. Heart is regular at 72/m  Pulmonary/Chest: Effort normal and breath sounds normal. No respiratory distress. She has no wheezes. She has no rales. She exhibits no tenderness.  Clear anteriorly and posteriorly  Abdominal: Soft. Bowel sounds are normal. She exhibits no mass. There is no tenderness. There is no rebound and no guarding.  No abdominal bruits tenderness or organ enlargement or masses.  Musculoskeletal: Normal range of motion. She exhibits no edema or tenderness.  Lymphadenopathy:    She has no cervical adenopathy.  Neurological: She is alert and oriented to person, place, and time. She has normal reflexes. No cranial nerve deficit.  The patient continues to have a slight head tremor which she has had for a good while.  Skin: Skin is warm and dry. No rash noted.  Psychiatric: She has a normal mood and affect. Her behavior is normal. Judgment and thought content normal.  Nursing note and vitals reviewed.  BP 151/63 mmHg  Pulse 66  Temp(Src) 97.4 F (36.3 C) (Oral)  Ht _0  (1.575 m)  Wt 129 lb (58.514 kg)  BMI 23.59 kg/m2        Assessment & Plan:  1. Hyperlipidemia -The patient should continue her current treatment pending results of lab work - CBC with Differential/Platelet - Lipid panel  2. Essential hypertension -The blood pressure is elevated today on 2 different occasions in the office today and she will increase her lisinopril to 10 mg daily from 5 mg and will bring readings back for review in 2-[redacted] weeks along with getting another BMP. - BMP8+EGFR - Hepatic function panel - CBC with Differential/Platelet - BMP8+EGFR; Future  3. Vitamin D deficiency -She will continue current treatment pending results of lab work - CBC with Differential/Platelet - Vit D   25 hydroxy (rtn osteoporosis monitoring)  4. Tingling in extremities -She has good pulses in her feet. - Thyroid Panel With TSH - Vitamin B12  5. Diarrhea -Try taking a probiotic as recommended and follow-up with surgery and gastroenterology as planned - CT Abdomen Pelvis Wo Contrast; Future  6. LLQ pain -Follow the above directions for diarrhea - CT Abdomen Pelvis Wo Contrast; Future  7. IBS (irritable bowel syndrome) -Continue Benefiber avoiding milk cheese ice cream and dairy products and caffeine and take probiotic and use Benefiber  No orders of the defined types were placed in this encounter.   Patient Instructions                       Medicare Annual Wellness Visit  [facility] and the medical providers at [facility] strive to bring you the best medical care.  In doing so we not only want to address your current medical conditions and concerns but also to detect new conditions early and prevent illness, disease and health-related problems.  Medicare offers a yearly Wellness Visit which allows our clinical staff to assess your need for preventative services including immunizations, lifestyle education, counseling to decrease risk of preventable diseases and screening for fall risk and other medical concerns.    This visit is provided free of charge (no copay) for all Medicare recipients. The clinical pharmacists at [facility] have begun to conduct these Wellness Visits which will also include a thorough review of all your medications.    As you primary medical provider recommend that you make an appointment for your Annual Wellness Visit if you have not done so already this year.  You may set up this appointment before you leave today or you may call back ([phone]) and schedule an appointment.  Please make sure when you call that you mention that you are scheduling your Annual Wellness Visit with the clinical pharmacist so that  the appointment may be made for the proper length of time.     Continue current medications. Continue good therapeutic lifestyle changes which include good diet and exercise. Fall precautions discussed with patient. If an FOBT was given today- please return it to our front desk. If you are over 42 years old - you may need Prevnar 31 or the adult Pneumonia vaccine.  **Flu shots will be available soon--- please call and schedule a FLU-CLINIC appointment**  After your visit with [place] today you will receive a survey in the mail or online from [employer] regarding your care with [place]. Please take a moment to fill this out. Your feedback is very important to [place] as you can help [place] better understand your patient needs as well as improve your experience and satisfaction. WE CARE ABOUT YOU!!!   **Please join [place] [email or web address], 2016 from 5:00 to 7:00pm for our OPEN HOUSE! Come out and meet our NEW providers**  the patient should follow-up with the gastroenterologist and the surgeon as planned  We will arrange for her to have a CT scan or ultrasound of her abdomen  We will also encourage her to take align , the probiotic that you can purchase over-the-counter and take one daily  Also to use Benefiber daily and avoid milk cheese ice cream and dairy products and caffeine in the diet  She should continue to drink plenty of water and fluids She is encouraged to exercise and walk more. She will increase her lisinopril to 10 mg daily and bring blood pressure readings by for review in 2-[redacted] weeks along with getting another BMP.   Arrie Senate MD

## 2015-01-26 LAB — THYROID PANEL WITH TSH
Free Thyroxine Index: 2.2 (ref 1.2–4.9)
T3 UPTAKE RATIO: 26 % (ref 24–39)
T4, Total: 8.6 ug/dL (ref 4.5–12.0)
TSH: 2.14 u[IU]/mL (ref 0.450–4.500)

## 2015-01-26 LAB — BMP8+EGFR
BUN/Creatinine Ratio: 7 — ABNORMAL LOW (ref 11–26)
BUN: 5 mg/dL — ABNORMAL LOW (ref 8–27)
CO2: 26 mmol/L (ref 18–29)
Calcium: 8.9 mg/dL (ref 8.7–10.3)
Chloride: 101 mmol/L (ref 97–108)
Creatinine, Ser: 0.72 mg/dL (ref 0.57–1.00)
GFR calc Af Amer: 90 mL/min/1.73
GFR calc non Af Amer: 78 mL/min/1.73
Glucose: 88 mg/dL (ref 65–99)
Potassium: 3.7 mmol/L (ref 3.5–5.2)
Sodium: 143 mmol/L (ref 134–144)

## 2015-01-26 LAB — CBC WITH DIFFERENTIAL/PLATELET
Basophils Absolute: 0 10*3/uL (ref 0.0–0.2)
Basos: 1 %
EOS (ABSOLUTE): 0.2 10*3/uL (ref 0.0–0.4)
EOS: 3 %
HEMATOCRIT: 39.4 % (ref 34.0–46.6)
Hemoglobin: 12.8 g/dL (ref 11.1–15.9)
Immature Grans (Abs): 0 10*3/uL (ref 0.0–0.1)
Immature Granulocytes: 0 %
LYMPHS ABS: 2.4 10*3/uL (ref 0.7–3.1)
Lymphs: 47 %
MCH: 28.3 pg (ref 26.6–33.0)
MCHC: 32.5 g/dL (ref 31.5–35.7)
MCV: 87 fL (ref 79–97)
MONOS ABS: 0.5 10*3/uL (ref 0.1–0.9)
Monocytes: 10 %
Neutrophils Absolute: 2 10*3/uL (ref 1.4–7.0)
Neutrophils: 39 %
Platelets: 259 10*3/uL (ref 150–379)
RBC: 4.53 x10E6/uL (ref 3.77–5.28)
RDW: 14.3 % (ref 12.3–15.4)
WBC: 5 10*3/uL (ref 3.4–10.8)

## 2015-01-26 LAB — LIPID PANEL
Chol/HDL Ratio: 2.9 ratio (ref 0.0–4.4)
Cholesterol, Total: 275 mg/dL — ABNORMAL HIGH (ref 100–199)
HDL: 94 mg/dL
LDL Calculated: 155 mg/dL — ABNORMAL HIGH (ref 0–99)
Triglycerides: 129 mg/dL (ref 0–149)
VLDL Cholesterol Cal: 26 mg/dL (ref 5–40)

## 2015-01-26 LAB — HEPATIC FUNCTION PANEL
ALT: 20 IU/L (ref 0–32)
AST: 24 IU/L (ref 0–40)
Albumin: 4 g/dL (ref 3.5–4.7)
Alkaline Phosphatase: 58 IU/L (ref 39–117)
BILIRUBIN, DIRECT: 0.11 mg/dL (ref 0.00–0.40)
Bilirubin Total: 0.4 mg/dL (ref 0.0–1.2)
TOTAL PROTEIN: 5.9 g/dL — AB (ref 6.0–8.5)

## 2015-01-26 LAB — VITAMIN B12: Vitamin B-12: 362 pg/mL (ref 211–946)

## 2015-01-26 LAB — URINE CULTURE

## 2015-01-26 LAB — VITAMIN D 25 HYDROXY (VIT D DEFICIENCY, FRACTURES): Vit D, 25-Hydroxy: 29.2 ng/mL — ABNORMAL LOW (ref 30.0–100.0)

## 2015-01-28 ENCOUNTER — Telehealth: Payer: Self-pay | Admitting: Internal Medicine

## 2015-01-28 ENCOUNTER — Other Ambulatory Visit: Payer: Self-pay | Admitting: *Deleted

## 2015-01-28 MED ORDER — LISINOPRIL 10 MG PO TABS: 10.0000 mg | ORAL_TABLET | Freq: Every day | ORAL | Status: DC

## 2015-01-28 NOTE — Telephone Encounter (Signed)
Left message for pt to call back  °

## 2015-01-29 NOTE — Telephone Encounter (Signed)
Discussed with pt that Dr. Hilarie Fredrickson would have to review her chart and decide if he felt he could absorb her into his practice or not. Pt decided to keep her appt with Dr. Silverio Decamp.

## 2015-01-30 ENCOUNTER — Ambulatory Visit (HOSPITAL_COMMUNITY)
Admission: RE | Admit: 2015-01-30 | Discharge: 2015-01-30 | Disposition: A | Discharge status: Home / Self Care | Payer: Medicare Other | Source: Ambulatory Visit | Attending: Family Medicine | Admitting: Family Medicine

## 2015-01-30 ENCOUNTER — Encounter (HOSPITAL_COMMUNITY): Payer: Self-pay

## 2015-01-30 DIAGNOSIS — I700 Atherosclerosis of aorta: Secondary | ICD-10-CM | POA: Diagnosis not present

## 2015-01-30 DIAGNOSIS — K5730 Diverticulosis of large intestine without perforation or abscess without bleeding: Secondary | ICD-10-CM | POA: Diagnosis not present

## 2015-01-30 DIAGNOSIS — K760 Fatty (change of) liver, not elsewhere classified: Secondary | ICD-10-CM | POA: Insufficient documentation

## 2015-01-30 DIAGNOSIS — K8020 Calculus of gallbladder without cholecystitis without obstruction: Secondary | ICD-10-CM | POA: Insufficient documentation

## 2015-01-30 DIAGNOSIS — R197 Diarrhea, unspecified: Secondary | ICD-10-CM | POA: Diagnosis present

## 2015-01-30 DIAGNOSIS — R1032 Left lower quadrant pain: Secondary | ICD-10-CM

## 2015-01-30 MED ORDER — IOHEXOL 300 MG/ML  SOLN
100.0000 mL | Freq: Once | INTRAMUSCULAR | Status: AC | PRN
  Administered 2015-01-30: 100 mL via INTRAVENOUS

## 2015-02-05 ENCOUNTER — Ambulatory Visit: Payer: Medicare Other | Admitting: Family Medicine

## 2015-02-12 ENCOUNTER — Other Ambulatory Visit: Payer: Self-pay | Admitting: *Deleted

## 2015-02-12 DIAGNOSIS — R197 Diarrhea, unspecified: Secondary | ICD-10-CM

## 2015-02-13 ENCOUNTER — Other Ambulatory Visit: Payer: Medicare Other

## 2015-02-13 DIAGNOSIS — R197 Diarrhea, unspecified: Secondary | ICD-10-CM

## 2015-02-14 LAB — GASTROINTESTINAL PATHOGEN PANEL PCR
C. difficile Tox A/B, PCR: NEGATIVE
CAMPYLOBACTER, PCR: NEGATIVE
Cryptosporidium, PCR: NEGATIVE
E COLI (STEC) STX1/STX2, PCR: NEGATIVE
E coli (ETEC) LT/ST PCR: NEGATIVE
E coli 0157, PCR: NEGATIVE
Giardia lamblia, PCR: NEGATIVE
NOROVIRUS, PCR: NEGATIVE
ROTAVIRUS, PCR: NEGATIVE
SALMONELLA, PCR: NEGATIVE
Shigella, PCR: NEGATIVE

## 2015-02-14 LAB — FECAL LACTOFERRIN, QUANT: LACTOFERRIN: NEGATIVE

## 2015-02-21 ENCOUNTER — Telehealth: Payer: Self-pay | Admitting: Internal Medicine

## 2015-02-21 NOTE — Telephone Encounter (Signed)
ok 

## 2015-02-21 NOTE — Telephone Encounter (Signed)
Appointment scheduled with Dr. Hilarie Fredrickson and spoke to Amy at [facility], she will call the patient to confirm

## 2015-02-22 LAB — PANCREATIC ELASTASE, FECAL: Pancreatic Elastase-1, Stool: 319 mcg/g

## 2015-03-11 ENCOUNTER — Ambulatory Visit: Payer: Medicare Other | Admitting: Gastroenterology

## 2015-03-14 ENCOUNTER — Other Ambulatory Visit: Payer: Self-pay

## 2015-03-14 DIAGNOSIS — Z1231 Encounter for screening mammogram for malignant neoplasm of breast: Secondary | ICD-10-CM

## 2015-03-18 ENCOUNTER — Telehealth: Payer: Self-pay | Admitting: Family Medicine

## 2015-03-18 NOTE — Telephone Encounter (Signed)
Stp and she hasn't started taking the benefiber of probiotic as instructed by DW[facility]. Advised pt to start on both of these and to follow up with GI as scheduled and to call GI to see if they have any cancellations if she can get in sooner. Pt voiced understanding.

## 2015-03-29 ENCOUNTER — Ambulatory Visit (INDEPENDENT_AMBULATORY_CARE_PROVIDER_SITE_OTHER): Payer: Medicare Other

## 2015-03-29 DIAGNOSIS — Z23 Encounter for immunization: Secondary | ICD-10-CM | POA: Diagnosis not present

## 2015-04-05 ENCOUNTER — Encounter: Payer: Self-pay | Admitting: Gastroenterology

## 2015-04-05 ENCOUNTER — Encounter: Payer: Self-pay | Admitting: Internal Medicine

## 2015-04-11 ENCOUNTER — Encounter: Payer: Self-pay | Admitting: *Deleted

## 2015-04-24 ENCOUNTER — Ambulatory Visit (INDEPENDENT_AMBULATORY_CARE_PROVIDER_SITE_OTHER): Payer: Medicare Other | Admitting: Internal Medicine

## 2015-04-24 ENCOUNTER — Encounter: Payer: Self-pay | Admitting: Internal Medicine

## 2015-04-24 VITALS — BP 152/70 | HR 72 | Ht 62.0 in | Wt 121.5 lb

## 2015-04-24 DIAGNOSIS — K529 Noninfective gastroenteritis and colitis, unspecified: Secondary | ICD-10-CM | POA: Diagnosis not present

## 2015-04-24 DIAGNOSIS — K580 Irritable bowel syndrome with diarrhea: Secondary | ICD-10-CM

## 2015-04-24 MED ORDER — RIFAXIMIN 550 MG PO TABS: 550.0000 mg | ORAL_TABLET | Freq: Three times a day (TID) | ORAL | Status: DC

## 2015-04-24 NOTE — Patient Instructions (Signed)
We have sent a prescription for Xifaxan to [facility]. If you have not heard from them in the next weeks, please let us know.  Call our office if no better after the Xifaxan. We may need to complete a colonoscopy with biopsy.  Follow up with Dr Hilarie Fredrickson in 3 months.

## 2015-04-24 NOTE — Addendum Note (Signed)
Addended by: Larina Bras on: 04/24/2015 12:07 PM   Modules accepted: Orders

## 2015-04-24 NOTE — Progress Notes (Signed)
Subjective:    Patient ID: Rachel Walton, female    DOB: [birth year], 81 y.o.   MRN: [id number]  HPI Aryn Mole is an 81 yo female with PMH of GERD, Schatzki's ring, gastric ulcer, IBS, diverticulosis, remote colon polyps, gallstones who is seen in follow-up. This is her first visit with me and she is alone today. She was previously seen and followed by Dr. Olevia Perches. She was last here in July 2016. She also has a history of tremor, hyperlipidemia, osteopenia, and macular degeneration. She follows with Dr. [name] for primary care.  She has been dealing with explosive watery diarrhea for 6-9 months. Stools are worse after eating and unpredictable. She reports at times she has no control over them. She has had accidents. It is made her not want to leave home. Stools are associated with bloating and gas. No rectal bleeding or melena. She has lost 15-20 pounds. She has tried dicyclomine which did not help and might have worsened her symptoms. She tried Stage manager and probiotic in the form of Align. She's avoided lactose. Stop caffeine. Watch her diet, all with no help. Stools occur 5-6 times a day and can happen throughout the night. Appetite has remained good though she worries about eating away from home due to possible diarrhea. She denies nausea, vomiting, fever or chills. She denies night sweats and flushing. She denies abdominal pain and hepato-biliary complaint.  Dr. [name] ordered a CT scan of the abdomen and pelvis in September which I have reviewed  While waiting on her appointment with me I ordered GI pathogen panel, fecal elastase and fecal leukocytes which were unrevealing/negative  Her last colonoscopy was on 09/13/2013. This showed mild diverticulosis in the sigmoid but was otherwise normal. No polyps or masses were seen. Prep reported as good. Exam completed to the cecum.  Review of Systems As per HPI, otherwise negative  Current Medications, Allergies, Past Medical History, Past Surgical  History, Family History and Social History were reviewed in [employer] record.     Objective:   Physical Exam BP 152/70 mmHg  Pulse 72  Ht 5\' 2"  (1.575 m)  Wt 121 lb 8 oz (55.112 kg)  BMI 22.22 kg/m2 Constitutional: Well-developed and well-nourished. No distress. HEENT: Normocephalic and atraumatic. Oropharynx is clear and moist. No oropharyngeal exudate. Conjunctivae are normal.  No scleral icterus. Neck: Neck supple. Trachea midline. Cardiovascular: Normal rate, regular rhythm and intact distal pulses. No M/R/G Pulmonary/chest: Effort normal and breath sounds normal. No wheezing, rales or rhonchi. Abdominal: Soft, nontender, nondistended. Bowel sounds active throughout. There are no masses palpable. Extremities: no clubbing, cyanosis, or edema Lymphadenopathy: No cervical adenopathy noted. Neurological: Alert and oriented to person place and time. Resting tremor, benign appearing Skin: Skin is warm and dry. No rashes noted. Psychiatric: Normal mood and affect. Behavior is normal.   CLINICAL DATA:  Chronic diarrhea but worsening over the last several months. Left lower quadrant abdominal pain.   EXAM: CT ABDOMEN AND PELVIS WITH CONTRAST   TECHNIQUE: Multidetector CT imaging of the abdomen and pelvis was performed using the standard protocol following bolus administration of intravenous contrast.   CONTRAST:  122mL OMNIPAQUE IOHEXOL 300 MG/ML  SOLN   COMPARISON:  06/06/2013   FINDINGS: Lower chest:  Stable scarring in the left lower lobe.   Hepatobiliary: Contracted gallbladder with multiple internal gallstones measuring up to approximately 1.2 cm in diameter. Diffuse hepatic steatosis.   Pancreas: Unremarkable   Spleen: Unremarkable   Adrenals/Urinary Tract: Slight nodularity  of the left adrenal gland without overt adrenal mass. Right extrarenal pelvis.   Stomach/Bowel: Air fluid levels in the distal colon reflecting diarrheal process.  Mild sigmoid diverticulosis. Preserved haustration. Borderline wall thickening in the descending and sigmoid colon terminal ileum unremarkable.   Vascular/Lymphatic: Mild abdominal aortic atherosclerotic calcification. Patent celiac trunk, SMA, and IMA.   Reproductive: Uterus absent.  Ovaries not seen.   Other: No supplemental non-categorized findings.   Musculoskeletal: Loss of disc height and vacuum disc phenomenon at L5-S1 with intervertebral and facet spurring and borderline left foraminal stenosis at this level.   IMPRESSION: 1. Air- fluid levels in the distal colon compatible with diarrheal process. Borderline thickening of the wall of the descending and sigmoid colon. There is sigmoid diverticulosis but no active diverticulitis identified. The mesenteric arteries are widely patent, and there is only mild abdominal aortic atherosclerosis, and accordingly a vascular/ischemic cause for the chronic diarrhea is less likely. No mass in the pancreas or elsewhere in the abdomen. 2. Cholelithiasis. 3. Diffuse hepatic steatosis.     Electronically Signed   By: Van Clines M.D.   On: 01/30/2015 16:44       Assessment & Plan:   81 yo female with PMH of GERD, Schatzki's ring, gastric ulcer, IBS, diverticulosis, remote colon polyps, gallstones who is seen in follow-up  1. Chronic, explosive diarrhea -- she has had appropriate lab and stool testing, CT scan which showed air-fluid levels consistent with diarrhea but no acute findings. Differential includes IBS with diarrhea predominance, bacterial overgrowth and microscopic colitis. I recommended first treating with rifaximin 550 mg 3 times a day 14 days. If no improvement, my recommendation would be for colonoscopy for biopsies to exclude microscopic colitis. Symptoms improve with rifaximin she can liberalize her diet to eat the food she enjoys.  2. Gallstones -- asymptomatic at this point. No indication for  cholecystectomy  3. Colon cancer screening -- up-to-date. My suspicion for malignancy as the cause of #1 is very low. Will not need repeat screening colonoscopy but may need colonoscopy for diarrhea if no improvement.  25 minutes spent with the patient today. Greater than 50% was spent in counseling and coordination of care with the patient

## 2015-04-25 ENCOUNTER — Telehealth: Payer: Self-pay | Admitting: Internal Medicine

## 2015-04-25 NOTE — Telephone Encounter (Signed)
I have refaxed paperwork with physicians signature added. 0 refills on Xifaxan. I have advised [facility] of this.

## 2015-04-25 NOTE — Telephone Encounter (Signed)
Dottie do you know about this?

## 2015-04-29 ENCOUNTER — Telehealth: Payer: Self-pay | Admitting: Internal Medicine

## 2015-04-29 NOTE — Telephone Encounter (Signed)
Patient states that her copay for Xifaxan will be $100. She will go forward with rx.

## 2015-04-30 ENCOUNTER — Telehealth: Payer: Self-pay | Admitting: Internal Medicine

## 2015-04-30 NOTE — Telephone Encounter (Signed)
Discussed with pt and she is concerned about the side effects from xifaxan. Discussed with her they have to list all possible. Pt wanted to know if she could cut it in half. Let pt know she should take it as ordered. States she will start it tomorrow.

## 2015-06-05 ENCOUNTER — Ambulatory Visit (INDEPENDENT_AMBULATORY_CARE_PROVIDER_SITE_OTHER): Payer: Medicare Other | Admitting: Family Medicine

## 2015-06-05 ENCOUNTER — Encounter: Payer: Self-pay | Admitting: Family Medicine

## 2015-06-05 VITALS — BP 152/68 | HR 79 | Temp 97.0°F | Ht 62.0 in | Wt 118.0 lb

## 2015-06-05 DIAGNOSIS — E785 Hyperlipidemia, unspecified: Secondary | ICD-10-CM | POA: Diagnosis not present

## 2015-06-05 DIAGNOSIS — R634 Abnormal weight loss: Secondary | ICD-10-CM

## 2015-06-05 DIAGNOSIS — E559 Vitamin D deficiency, unspecified: Secondary | ICD-10-CM | POA: Diagnosis not present

## 2015-06-05 DIAGNOSIS — R1084 Generalized abdominal pain: Secondary | ICD-10-CM

## 2015-06-05 DIAGNOSIS — I10 Essential (primary) hypertension: Secondary | ICD-10-CM

## 2015-06-05 DIAGNOSIS — F411 Generalized anxiety disorder: Secondary | ICD-10-CM | POA: Diagnosis not present

## 2015-06-05 DIAGNOSIS — R531 Weakness: Secondary | ICD-10-CM

## 2015-06-05 MED ORDER — CLONAZEPAM 0.5 MG PO TABS: 0.5000 mg | ORAL_TABLET | Freq: Two times a day (BID) | ORAL | Status: DC | PRN

## 2015-06-05 NOTE — Patient Instructions (Addendum)
Medicare Annual Wellness Visit  [facility] and the medical providers at [facility] strive to bring you the best medical care.  In doing so we not only want to address your current medical conditions and concerns but also to detect new conditions early and prevent illness, disease and health-related problems.    Medicare offers a yearly Wellness Visit which allows our clinical staff to assess your need for preventative services including immunizations, lifestyle education, counseling to decrease risk of preventable diseases and screening for fall risk and other medical concerns.    This visit is provided free of charge (no copay) for all Medicare recipients. The clinical pharmacists at [facility] have begun to conduct these Wellness Visits which will also include a thorough review of all your medications.    As you primary medical provider recommend that you make an appointment for your Annual Wellness Visit if you have not done so already this year.  You may set up this appointment before you leave today or you may call back [phone] and schedule an appointment.  Please make sure when you call that you mention that you are scheduling your Annual Wellness Visit with the clinical pharmacist so that the appointment may be made for the proper length of time.     Continue current medications. Continue good therapeutic lifestyle changes which include good diet and exercise. Fall precautions discussed with patient. If an FOBT was given today- please return it to our front desk. If you are over 73 years old - you may need Prevnar 49 or the adult Pneumonia vaccine.  **Flu shots are available--- please call and schedule a FLU-CLINIC appointment**  After your visit with [place] today you will receive a survey in the mail or online from [employer] regarding your care with [place]. Please take a moment to fill this out. Your feedback is very  important to [place] as you can help [place] better understand your patient needs as well as improve your experience and satisfaction. WE CARE ABOUT YOU!!!   The patient is encouraged to follow-up with her neurologist at [facility] for her tremor. She should exercise regularly and walking is one of the best exercises of all. She should also call her gastroenterologist and make arrangements to follow up with him as he recommended We will schedule an ultrasound of her abdomen and pelvis because of the continued abdominal pain and we'll make sure that he gets a copy of this report

## 2015-06-05 NOTE — Progress Notes (Signed)
Subjective:    Patient ID: Rachel Walton, female    DOB: 1929-11-11, 82 y.o.   MRN: [id number]  HPI Pt here for follow up and management of chronic medical problems which includes hypertension and hyperlipidemia. She is taking medications regularly the patient today complains of abnormal bowel problems. She has seen Dr. Elmo Putt in the past. She is also complaining of abdominal pain. She is having weakness and weight loss. She is requesting a refill on her clonazepam. Patient continues to have abdominal pain and loose bowel movements. The pain is worse around the periumbilical area. She has not seen any blood in the stool. She has had some weight loss and weakness. She was told by the gastroenterologist that she should call back and reschedule a follow-up visit with him which she plans to do. She denies any chest pain or shortness of breath. She is swallowing mostly without problems but does have occasional swallowing issues also. Is not been any blood in the stool or black tarry bowel movements. She is passing her water without problems and she denies chest pain or shortness of breath.      Patient Active Problem List   Diagnosis Date Noted  . Esophageal stricture 12/11/2013  . Allergic rhinitis due to pollen 12/11/2013  . Essential hypertension 12/11/2013  . Generalized anxiety disorder 08/21/2013  . Gall stones 06/15/2013  . Hyperlipemia 11/21/2012  . Tremor   . Postmenopausal   . Colon polyp   . Osteopenia of the elderly   . Kyphosis   . Recurrent UTI   . Atrophic vaginitis   . Diverticulosis   . Meningitis, history of    Outpatient Encounter Prescriptions as of 06/05/2015  Medication Sig  . Cholecalciferol (VITAMIN D3) 2000 UNITS TABS Take 1 tablet by mouth 2 (two) times a week. As needed  . clonazePAM (KLONOPIN) 0.5 MG tablet Take 1 tablet (0.5 mg total) by mouth 2 (two) times daily as needed (stress).  . fish oil-omega-3 fatty acids 1000 MG capsule Take 2 g by mouth daily.  .  fluticasone (FLONASE) 50 MCG/ACT nasal spray as needed.   Marland Kitchen lisinopril (PRINIVIL,ZESTRIL) 10 MG tablet Take 1 tablet (10 mg total) by mouth daily.  . Multiple Vitamins-Minerals (ICAPS AREDS FORMULA PO) Take 1 tablet by mouth once.   . naphazoline-pheniramine (NAPHCON-A) 0.025-0.3 % ophthalmic solution as needed.   . polyvinyl alcohol-povidone (REFRESH) 1.4-0.6 % ophthalmic solution Place 1-2 drops into both eyes as needed.   . rosuvastatin (CRESTOR) 5 MG tablet Take 1 tablet (5 mg total) by mouth at bedtime. (Patient taking differently: Take by mouth. 2.5 mg 2-3 times per week)  . sodium chloride (OCEAN) 0.65 % SOLN nasal spray Place 1 spray into both nostrils as needed for congestion.  . [DISCONTINUED] rifaximin (XIFAXAN) 550 MG TABS tablet Take 1 tablet (550 mg total) by mouth 3 (three) times daily.   No facility-administered encounter medications on file as of 06/05/2015.     Review of Systems  Constitutional: Negative.   HENT: Negative.   Eyes: Negative.   Respiratory: Negative.   Cardiovascular: Negative.   Gastrointestinal: Negative.        Abnormal stool, weight loss, weak  Endocrine: Negative.   Genitourinary: Negative.   Musculoskeletal: Negative.   Skin: Negative.   Allergic/Immunologic: Negative.   Neurological: Negative.   Hematological: Negative.   Psychiatric/Behavioral: Negative.        Objective:   Physical Exam  Constitutional: She is oriented to person, place, and time. She  appears well-developed and well-nourished. She appears distressed.  The patient has been somewhat stressed worrying about her husband who's had a GI bleed and the loss of her son this past summer. She is followed regularly by a neurologist regarding her tremor.  HENT:  Head: Normocephalic and atraumatic.  Right Ear: External ear normal.  Left Ear: External ear normal.  Nose: Nose normal.  Mouth/Throat: Oropharynx is clear and moist.  Eyes: Conjunctivae and EOM are normal. Pupils are equal,  round, and reactive to light. Right eye exhibits no discharge. Left eye exhibits no discharge. No scleral icterus.  Neck: Normal range of motion. Neck supple. No thyromegaly present.  No thyromegaly or bruits or anterior cervical adenopathy  Cardiovascular: Normal rate, regular rhythm and normal heart sounds.   No murmur heard. The heart is regular at 84/m  Pulmonary/Chest: Effort normal and breath sounds normal. No respiratory distress. She has no wheezes. She has no rales. She exhibits no tenderness.  Clear anteriorly and posteriorly  Abdominal: Soft. Bowel sounds are normal. She exhibits no mass. There is tenderness. There is no rebound and no guarding.  There is some generalized. Umbilical tenderness without liver or spleen enlargement and no masses palpated. There are also no bruits.  Musculoskeletal: Normal range of motion. She exhibits no edema.  Lymphadenopathy:    She has no cervical adenopathy.  Neurological: She is alert and oriented to person, place, and time. She has normal reflexes. No cranial nerve deficit.  The patient did have a tremor in both hands but with continued talking this dissipated.  Skin: Skin is warm and dry. No rash noted.  Psychiatric: She has a normal mood and affect. Her behavior is normal. Judgment and thought content normal.  Nursing note and vitals reviewed.  BP 152/68 mmHg  Pulse 79  Temp(Src) 97 F (36.1 C) (Oral)  Ht 5' 2"  (1.575 m)  Wt 118 lb (53.524 kg)  BMI 21.58 kg/m2        Assessment & Plan:  1. Hyperlipidemia -Continue current treatment pending results of lab work - CBC with Differential/Platelet - NMR, lipoprofile  2. Vitamin D deficiency -Continue current treatment pending results of lab work - CBC with Differential/Platelet - VITAMIN D 25 Hydroxy (Vit-D Deficiency, Fractures)  3. Essential hypertension -The blood pressure slightly elevated today and no change in treatment is recommended because of the stress she's been under  with taking care of her husband. - BMP8+EGFR - CBC with Differential/Platelet - Hepatic function panel  4. Hyperlipemia -Continue current treatment pending results of lab work  5. Generalized anxiety disorder  6. Weak -She has had some weight loss associated with the continued loose bowel movements. - US Abdomen Complete; Future  7. Generalized abdominal pain - US Abdomen Complete; Future  8. Loss of weight - US Abdomen Complete; Future  Meds ordered this encounter  Medications  . clonazePAM (KLONOPIN) 0.5 MG tablet    Sig: Take 1 tablet (0.5 mg total) by mouth 2 (two) times daily as needed (stress).    Dispense:  60 tablet    Refill:  2   Patient Instructions                       Medicare Annual Wellness Visit  [facility] and the medical providers at [facility] strive to bring you the best medical care.  In doing so we not only want to address your current medical conditions and concerns but also to detect new  conditions early and prevent illness, disease and health-related problems.    Medicare offers a yearly Wellness Visit which allows our clinical staff to assess your need for preventative services including immunizations, lifestyle education, counseling to decrease risk of preventable diseases and screening for fall risk and other medical concerns.    This visit is provided free of charge (no copay) for all Medicare recipients. The clinical pharmacists at [facility] have begun to conduct these Wellness Visits which will also include a thorough review of all your medications.    As you primary medical provider recommend that you make an appointment for your Annual Wellness Visit if you have not done so already this year.  You may set up this appointment before you leave today or you may call back [phone]) and schedule an appointment.  Please make sure when you call that you mention that you are scheduling your Annual  Wellness Visit with the clinical pharmacist so that the appointment may be made for the proper length of time.     Continue current medications. Continue good therapeutic lifestyle changes which include good diet and exercise. Fall precautions discussed with patient. If an FOBT was given today- please return it to our front desk. If you are over 64 years old - you may need Prevnar 60 or the adult Pneumonia vaccine.  **Flu shots are available--- please call and schedule a FLU-CLINIC appointment**  After your visit with [place] today you will receive a survey in the mail or online from [employer] regarding your care with [place]. Please take a moment to fill this out. Your feedback is very important to [place] as you can help [place] better understand your patient needs as well as improve your experience and satisfaction. WE CARE ABOUT YOU!!!   The patient is encouraged to follow-up with her neurologist at [facility] for her tremor. She should exercise regularly and walking is one of the best exercises of all. She should also call her gastroenterologist and make arrangements to follow up with him as he recommended We will schedule an ultrasound of her abdomen and pelvis because of the continued abdominal pain and we'll make sure that he gets a copy of this report   Arrie Senate MD

## 2015-06-06 LAB — BMP8+EGFR
BUN / CREAT RATIO: 12 (ref 11–26)
BUN: 8 mg/dL (ref 8–27)
CALCIUM: 8.6 mg/dL — AB (ref 8.7–10.3)
CHLORIDE: 101 mmol/L (ref 96–106)
CO2: 28 mmol/L (ref 18–29)
CREATININE: 0.69 mg/dL (ref 0.57–1.00)
GFR calc Af Amer: 93 mL/min/{1.73_m2} (ref >59)
GFR calc non Af Amer: 81 mL/min/{1.73_m2} (ref >59)
GLUCOSE: 91 mg/dL (ref 65–99)
Potassium: 3.5 mmol/L (ref 3.5–5.2)
Sodium: 143 mmol/L (ref 134–144)

## 2015-06-06 LAB — CBC WITH DIFFERENTIAL/PLATELET
BASOS ABS: 0 10*3/uL (ref 0.0–0.2)
Basos: 1 %
EOS (ABSOLUTE): 0.1 10*3/uL (ref 0.0–0.4)
EOS: 3 %
HEMATOCRIT: 41.1 % (ref 34.0–46.6)
HEMOGLOBIN: 13.5 g/dL (ref 11.1–15.9)
IMMATURE GRANS (ABS): 0 10*3/uL (ref 0.0–0.1)
IMMATURE GRANULOCYTES: 0 %
LYMPHS ABS: 2.1 10*3/uL (ref 0.7–3.1)
LYMPHS: 43 %
MCH: 29 pg (ref 26.6–33.0)
MCHC: 32.8 g/dL (ref 31.5–35.7)
MCV: 88 fL (ref 79–97)
MONOCYTES: 7 %
Monocytes Absolute: 0.4 10*3/uL (ref 0.1–0.9)
NEUTROS PCT: 46 %
Neutrophils Absolute: 2.2 10*3/uL (ref 1.4–7.0)
Platelets: 241 10*3/uL (ref 150–379)
RBC: 4.65 x10E6/uL (ref 3.77–5.28)
RDW: 13.6 % (ref 12.3–15.4)
WBC: 4.9 10*3/uL (ref 3.4–10.8)

## 2015-06-06 LAB — NMR, LIPOPROFILE
CHOLESTEROL: 295 mg/dL — AB (ref 100–199)
HDL Cholesterol by NMR: 101 mg/dL (ref >39)
HDL PARTICLE NUMBER: 40 umol/L (ref >=30.5)
LDL Particle Number: 1697 nmol/L — ABNORMAL HIGH (ref <1000)
LDL Size: 21.8 nm (ref >20.5)
LDL-C: 174 mg/dL — AB (ref 0–99)
Triglycerides by NMR: 101 mg/dL (ref 0–149)

## 2015-06-06 LAB — HEPATIC FUNCTION PANEL
ALBUMIN: 3.8 g/dL (ref 3.5–4.7)
ALK PHOS: 56 IU/L (ref 39–117)
ALT: 13 IU/L (ref 0–32)
AST: 21 IU/L (ref 0–40)
BILIRUBIN TOTAL: 0.5 mg/dL (ref 0.0–1.2)
Bilirubin, Direct: 0.12 mg/dL (ref 0.00–0.40)
TOTAL PROTEIN: 5.9 g/dL — AB (ref 6.0–8.5)

## 2015-06-06 LAB — VITAMIN D 25 HYDROXY (VIT D DEFICIENCY, FRACTURES): VIT D 25 HYDROXY: 33.9 ng/mL (ref 30.0–100.0)

## 2015-06-10 ENCOUNTER — Ambulatory Visit (HOSPITAL_COMMUNITY)
Admission: RE | Admit: 2015-06-10 | Discharge: 2015-06-10 | Disposition: A | Discharge status: Home / Self Care | Payer: Medicare Other | Source: Ambulatory Visit | Attending: Family Medicine | Admitting: Family Medicine

## 2015-06-10 DIAGNOSIS — R531 Weakness: Secondary | ICD-10-CM

## 2015-06-10 DIAGNOSIS — R634 Abnormal weight loss: Secondary | ICD-10-CM

## 2015-06-10 DIAGNOSIS — R932 Abnormal findings on diagnostic imaging of liver and biliary tract: Secondary | ICD-10-CM | POA: Insufficient documentation

## 2015-06-10 DIAGNOSIS — R1084 Generalized abdominal pain: Secondary | ICD-10-CM | POA: Insufficient documentation

## 2015-06-10 DIAGNOSIS — K8020 Calculus of gallbladder without cholecystitis without obstruction: Secondary | ICD-10-CM | POA: Diagnosis not present

## 2015-07-17 ENCOUNTER — Other Ambulatory Visit: Payer: Self-pay | Admitting: Family Medicine

## 2015-07-25 ENCOUNTER — Telehealth: Payer: Self-pay | Admitting: Internal Medicine

## 2015-07-25 NOTE — Telephone Encounter (Signed)
Pt states that she is having issues with diarrhea. States the xifaxan really did not help. Pt reports that when she eats the food seems to just go right through her, states she does not make it to the bathroom sometimes. Pt states she has lost quite a bit of weight also. States she doesn't know what to do but she is tired of all the diarrhea. Please advise.

## 2015-07-26 ENCOUNTER — Ambulatory Visit (INDEPENDENT_AMBULATORY_CARE_PROVIDER_SITE_OTHER): Payer: Medicare Other | Admitting: Family Medicine

## 2015-07-26 ENCOUNTER — Encounter: Payer: Self-pay | Admitting: Family Medicine

## 2015-07-26 VITALS — BP 190/68 | HR 69 | Temp 96.8°F | Ht 62.0 in | Wt 119.2 lb

## 2015-07-26 DIAGNOSIS — L259 Unspecified contact dermatitis, unspecified cause: Secondary | ICD-10-CM | POA: Diagnosis not present

## 2015-07-26 MED ORDER — BETAMETHASONE SOD PHOS & ACET 6 (3-3) MG/ML IJ SUSP
6.0000 mg | Freq: Once | INTRAMUSCULAR | Status: AC
  Administered 2015-07-26: 6 mg via INTRAMUSCULAR

## 2015-07-26 NOTE — Progress Notes (Signed)
Subjective:  Patient ID: Rachel Walton, female    DOB: 08-16-1929  Age: 82 y.o. MRN: [id number]  CC: Rash   HPI Rachel Walton presents for 2 days of rash on the forearms and legs that is mild to moderately pruritic. There is no burning sensation or pain. There are no upper respiratory symptoms. No cough and no fever. Patient states that she had an explosive diarrhea episode 3 days ago and she is wondering if some of the fecal matter splattered on her arms. She denies rash in the buttocks and lower back and thighs however.   History Rachel Walton has a past medical history of Tremor; Postmenopausal; adenomatous colonic polyps; Kyphosis; Recurrent UTI; Atrophic vaginitis; Diverticulosis; Meningoencephalitis (2005); Cataract; Hyperlipidemia; Diverticulosis; Osteopenia of the elderly; Macular degeneration; Breast cyst; IBS (irritable bowel syndrome); Vitamin D deficiency; Acute gastric ulcer; Esophageal stricture; Fatty liver; and Hiatal hernia.   She has past surgical history that includes Back surgery; Appendectomy; Cataract surgery; Eye surgery; Breast surgery; and Abdominal hysterectomy (1980).   Her family history includes Alcohol abuse in her sister; Brain cancer in her son; Breast cancer in her sister; Cancer in her sister; Colon cancer in her brother; Diabetes in her maternal uncle, son, and son; Heart disease in her brother and father; Hypertension in her sister, sister, sister, sister, sister, sister, and sister; Stroke in her mother and sister.She reports that she has never smoked. She has never used smokeless tobacco. She reports that she does not drink alcohol or use illicit drugs.    ROS Review of Systems  Constitutional: Negative for fever, activity change and appetite change.  HENT: Negative for congestion, rhinorrhea and sore throat.   Eyes: Negative for visual disturbance.  Respiratory: Negative for cough and shortness of breath.   Cardiovascular: Negative for chest pain and  palpitations.  Gastrointestinal: Positive for diarrhea (chronic for about a year and f. Multiple workups have been performed by primary care and GI. No resolution in spite of multiple medications. See  history of present illness). Negative for nausea and abdominal pain.  Genitourinary: Negative for dysuria.    Objective:  BP 190/68 mmHg  Pulse 69  Temp(Src) 96.8 F (36 C) (Oral)  Ht 5\' 2"  (1.575 m)  Wt 119 lb 3.2 oz (54.069 kg)  BMI 21.80 kg/m2  BP Readings from Last 3 Encounters:  07/26/15 190/68  06/05/15 152/68  04/24/15 152/70    Wt Readings from Last 3 Encounters:  07/26/15 119 lb 3.2 oz (54.069 kg)  06/05/15 118 lb (53.524 kg)  04/24/15 121 lb 8 oz (55.112 kg)     Physical Exam  Constitutional: She is oriented to person, place, and time. She appears well-developed and well-nourished. No distress.  HENT:  Head: Normocephalic and atraumatic.  Eyes: Conjunctivae are normal. Pupils are equal, round, and reactive to light.  Cardiovascular: Normal rate, regular rhythm and normal heart sounds.   No murmur heard. Pulmonary/Chest: Effort normal and breath sounds normal.  Abdominal: Soft.  Musculoskeletal: Normal range of motion.  Neurological: She is alert and oriented to person, place, and time.  Skin: Skin is warm and dry. Rash (maculopapular erythema is moderate over the forearms minimal at the lower legs with dry scaly skin as well on the X) noted.  Psychiatric: Her speech is normal and behavior is normal. Judgment and thought content normal. She exhibits a depressed mood (at one point tearful when discussing the death of her son 10 months ago).     Lab Results  Component  Value Date   WBC 4.9 06/05/2015   HGB 13.5 09/17/2014   HCT 41.1 06/05/2015   PLT 241 06/05/2015   GLUCOSE 91 06/05/2015   CHOL 295* 06/05/2015   TRIG 101 06/05/2015   HDL 101 06/05/2015   LDLCALC 155* 01/25/2015   ALT 13 06/05/2015   AST 21 06/05/2015   NA 143 06/05/2015   K 3.5 06/05/2015     CL 101 06/05/2015   CREATININE 0.69 06/05/2015   BUN 8 06/05/2015   CO2 28 06/05/2015   TSH 2.140 01/25/2015    US Abdomen Complete  06/10/2015  ADDENDUM REPORT: 06/10/2015 11:52 ADDENDUM: Comparison was made with CT abdomen 01/30/2015 during the original interpretation. No significant interval change compared with that exam. Electronically Signed   By: Kathreen Devoid   On: 06/10/2015 11:52  06/10/2015  CLINICAL DATA:  Mid abdominal pain.  Weakness.  Weight loss. EXAM: ABDOMEN ULTRASOUND COMPLETE COMPARISON:  None. FINDINGS: Gallbladder: Echogenic shadowing material within the gallbladder a with the areas shadowing. No gallbladder wall thickening. No pericholecystic fluid. Negative sonographic Murphy sign. Common bile duct: Diameter: 4.1 mm Liver: No focal lesion identified. Increased hepatic parenchymal echogenicity. IVC: No abnormality visualized. Pancreas: Limited visualization secondary to overlying bowel gas. Spleen: Size and appearance within normal limits. Right Kidney: Length: 11 cm. Echogenicity within normal limits. No mass or hydronephrosis visualized. Left Kidney: Length: 11 cm. Echogenicity within normal limits. No mass or hydronephrosis visualized. Abdominal aorta: No aneurysm visualized. Other findings: None. IMPRESSION: 1. Cholelithiasis and tumefactive gallbladder sludge without sonographic evidence of acute cholecystitis. 2. Increased hepatic echogenicity as can be seen with hepatic steatosis. Electronically Signed: By: Kathreen Devoid On: 06/10/2015 10:20    Assessment & Plan:   Rachel Walton was seen today for rash.  Diagnoses and all orders for this visit:  Contact dermatitis -     betamethasone acetate-betamethasone sodium phosphate (CELESTONE) injection 6 mg; Inject 1 mL (6 mg total) into the muscle once.    Some elements of depression, diarrhea that need to be investigated further by her primary care doctor. She was referred back to Dr. [name] and to her gastroenterologist  I am  having Rachel Walton maintain her Vitamin D3, fish oil-omega-3 fatty acids, polyvinyl alcohol-povidone, Multiple Vitamins-Minerals (ICAPS AREDS FORMULA PO), naphazoline-pheniramine, sodium chloride, fluticasone, rosuvastatin, clonazePAM, lisinopril, DiphenhydrAMINE HCl (BENADRYL ALLERGY PO), and hydrocortisone. We administered betamethasone acetate-betamethasone sodium phosphate.  Meds ordered this encounter  Medications  . DiphenhydrAMINE HCl (BENADRYL ALLERGY PO)    Sig: Take by mouth.  . hydrocortisone 2.5 % cream    Sig: Apply topically 2 (two) times daily.  . betamethasone acetate-betamethasone sodium phosphate (CELESTONE) injection 6 mg    Sig:      Follow-up: Return if symptoms worsen or fail to improve.  Claretta Fraise, M.D.

## 2015-07-29 NOTE — Telephone Encounter (Signed)
I recommend colonoscopy for further evaluation of her chronic diarrhea

## 2015-07-30 NOTE — Telephone Encounter (Signed)
Pt scheduled for previsit 08/08/15@2 :30pm, Colon scheduled in the [facility] 08/15/15@10am . Pt aware of appts.

## 2015-07-31 ENCOUNTER — Other Ambulatory Visit: Payer: Self-pay | Admitting: Physician Assistant

## 2015-08-06 ENCOUNTER — Ambulatory Visit: Payer: Medicare Other | Admitting: Family Medicine

## 2015-08-06 ENCOUNTER — Other Ambulatory Visit: Payer: Self-pay | Admitting: Physician Assistant

## 2015-08-08 ENCOUNTER — Ambulatory Visit (AMBULATORY_SURGERY_CENTER): Payer: Self-pay | Admitting: *Deleted

## 2015-08-08 ENCOUNTER — Telehealth: Payer: Self-pay | Admitting: *Deleted

## 2015-08-08 VITALS — Ht 62.0 in | Wt 114.0 lb

## 2015-08-08 DIAGNOSIS — K529 Noninfective gastroenteritis and colitis, unspecified: Secondary | ICD-10-CM

## 2015-08-08 NOTE — Progress Notes (Signed)
No egg or soy allergy known to patient  No issues with past sedation with any surgeries  or procedures, no intubation problems  No diet pills per patient No home 02 use per patient  No blood thinners per patient  Pt denies issues with constipation  Put in suprep and has an allergy / contraindication because of the alendronate- will do miralax due to allergy.

## 2015-08-08 NOTE — Telephone Encounter (Signed)
Dr Hilarie Fredrickson, This pt is in [place] with me today and she is scheduled for a colon with you on 4-6 Thursday for chronic diarrhea. She had an EGD with Dr Olevia Perches in 2015 where she ahd an ulcer and then a repeat egd showed 95% healing but she was positive for Barrett's. This was July 2015. Pt is asking if she can have an egd with her colon?  I was not sure since her last egd  was July 2015  if insurance would pay for it now or not until July or if you wanted her to have both. Please advise,  thanks  Lelan Pons PV

## 2015-08-09 ENCOUNTER — Encounter: Payer: Medicare Other | Admitting: *Deleted

## 2015-08-09 LAB — HM MAMMOGRAPHY

## 2015-08-09 NOTE — Telephone Encounter (Signed)
Given history of ulceration at GE junction along with Barrett's esophagus 2 years ago, repeat endoscopy at the time of plan colonoscopy is reasonable This test can be added

## 2015-08-09 NOTE — Telephone Encounter (Signed)
Pt informed Dr Hilarie Fredrickson does think egd is needed. Offered to RS and have both procedures the same day but pt wishes to keep her colopn 4-6 and schedule egd separate.   Scheduled PV 4-20 at 2 pm and EGD 4-25 at 11 am to arrive at 10 am. Instructed pt she will get these appointments on her AVS next week 4-6 at her colon. She verbalized understanding. Lelan Pons PV

## 2015-08-15 ENCOUNTER — Telehealth: Payer: Self-pay | Admitting: Gastroenterology

## 2015-08-15 ENCOUNTER — Ambulatory Visit (AMBULATORY_SURGERY_CENTER): Payer: Medicare Other | Admitting: Internal Medicine

## 2015-08-15 ENCOUNTER — Encounter: Payer: Self-pay | Admitting: Internal Medicine

## 2015-08-15 VITALS — BP 145/61 | HR 69 | Temp 98.0°F | Resp 18 | Ht 62.0 in | Wt 114.0 lb

## 2015-08-15 DIAGNOSIS — K529 Noninfective gastroenteritis and colitis, unspecified: Secondary | ICD-10-CM

## 2015-08-15 DIAGNOSIS — K6389 Other specified diseases of intestine: Secondary | ICD-10-CM | POA: Diagnosis not present

## 2015-08-15 MED ORDER — SODIUM CHLORIDE 0.9 % IV SOLN: 500.0000 mL | INTRAVENOUS | Status: DC

## 2015-08-15 NOTE — Progress Notes (Signed)
Patient without symptoms, does say that she is having "some discomfort in llq. Patient unable to rate from 0-10.

## 2015-08-15 NOTE — Telephone Encounter (Signed)
On call note. Pt up all night and feels weak from bowel prep. Stools clear. Chronic diarrhea for months. Doesn't feel she can take second dose of prep. Advised to push clear liquids for 30 minutes and take some of second dose of prep if she can. Report for colonoscopy as scheduled today.

## 2015-08-15 NOTE — Progress Notes (Signed)
Pt has rash to arms and legs since middle of March-has been seen by MD for this and areas have been bx'd  Dr. Hilarie Fredrickson is aware that pt was seen by EMS today and pt is ok to proceed with procedure

## 2015-08-15 NOTE — Progress Notes (Signed)
Patient alert, skin warm, dry and pink. Patient is symptomatic.

## 2015-08-15 NOTE — Op Note (Signed)
[facility] Patient Name: Rachel Walton Procedure Date: 08/15/2015 9:29 AM MRN: [id number] Endoscopist: Jerene Bears , MD Age: 82 Date of Birth: [birth year] Gender: Female Procedure:                Colonoscopy Indications:              Last colonoscopy: May 2015, Chronic diarrhea Medicines:                Monitored Anesthesia Care Procedure:                Pre-Anesthesia Assessment:                           - Prior to the procedure, a History and Physical                            was performed, and patient medications and                            allergies were reviewed. The patient's tolerance of                            previous anesthesia was also reviewed. The risks                            and benefits of the procedure and the sedation                            options and risks were discussed with the patient.                            All questions were answered, and informed consent                            was obtained. Prior Anticoagulants: The patient has                            taken no previous anticoagulant or antiplatelet                            agents. ASA Grade Assessment: III - A patient with                            severe systemic disease. After reviewing the risks                            and benefits, the patient was deemed in                            satisfactory condition to undergo the procedure.                           After obtaining informed consent, the colonoscope  was passed under direct vision. Throughout the                            procedure, the patient's blood pressure, pulse, and                            oxygen saturations were monitored continuously. The                            Model PCF-H190DL [id number]) scope was introduced                            through the anus and advanced to the the terminal                            ileum. The colonoscopy was performed without                      difficulty. The patient tolerated the procedure                            well. The quality of the bowel preparation was                            good. The terminal ileum, ileocecal valve,                            appendiceal orifice, and rectum were photographed. Scope In: 9:46:02 AM Scope Out: 10:00:32 AM Scope Withdrawal Time: 0 hours 10 minutes 7 seconds  Total Procedure Duration: 0 hours 14 minutes 30 seconds  Findings:                 The digital rectal exam findings include                            non-thrombosed internal hemorrhoids.                           A few diverticula were found in the sigmoid colon.                           The exam was otherwise without abnormality.                           Biopsies for histology were taken with a cold                            forceps from the right colon and left colon for                            evaluation of microscopic colitis.                           Internal hemorrhoids were found during  retroflexion. The hemorrhoids were small. Complications:            No immediate complications. Estimated Blood Loss:     Estimated blood loss was minimal. Impression:               - Diverticulosis in the sigmoid colon.                           - The examination was otherwise normal.                           - Internal hemorrhoids.                           - Biopsies were taken with a cold forceps from the                            right colon and left colon for evaluation of                            microscopic colitis. Recommendation:           - Patient has a contact number available for                            emergencies. The signs and symptoms of potential                            delayed complications were discussed with the                            patient. Return to normal activities tomorrow.                            Written discharge instructions were  provided to the                            patient.                           - Resume previous diet.                           - Continue present medications.                           - Await pathology results.                           - No repeat colonoscopy due to age. Jerene Bears, MD 08/15/2015 10:06:55 AM This report has been signed electronically.

## 2015-08-15 NOTE — Patient Instructions (Signed)
YOU HAD AN ENDOSCOPIC PROCEDURE TODAY AT THE [facility]:   Refer to the procedure report that was given to you for any specific questions about what was found during the examination.  If the procedure report does not answer your questions, please call your gastroenterologist to clarify.  If you requested that your care partner not be given the details of your procedure findings, then the procedure report has been included in a sealed envelope for you to review at your convenience later.  YOU SHOULD EXPECT: Some feelings of bloating in the abdomen. Passage of more gas than usual.  Walking can help get rid of the air that was put into your GI tract during the procedure and reduce the bloating. If you had a lower endoscopy (such as a colonoscopy or flexible sigmoidoscopy) you may notice spotting of blood in your stool or on the toilet paper. If you underwent a bowel prep for your procedure, you may not have a normal bowel movement for a few days.  Please Note:  You might notice some irritation and congestion in your nose or some drainage.  This is from the oxygen used during your procedure.  There is no need for concern and it should clear up in a day or so.  SYMPTOMS TO REPORT IMMEDIATELY:   Following lower endoscopy (colonoscopy or flexible sigmoidoscopy):  Excessive amounts of blood in the stool  Significant tenderness or worsening of abdominal pains  Swelling of the abdomen that is new, acute  Fever of 100F or higher   For urgent or emergent issues, a gastroenterologist can be reached at any hour by calling [phone].   DIET: Your first meal following the procedure should be a small meal and then it is ok to progress to your normal diet. Heavy or fried foods are harder to digest and may make you feel nauseous or bloated.  Likewise, meals heavy in dairy and vegetables can increase bloating.  Drink plenty of fluids but you should avoid alcoholic beverages for 24  hours.  ACTIVITY:  You should plan to take it easy for the rest of today and you should NOT DRIVE or use heavy machinery until tomorrow (because of the sedation medicines used during the test).    FOLLOW UP: Our staff will call the number listed on your records the next business day following your procedure to check on you and address any questions or concerns that you may have regarding the information given to you following your procedure. If we do not reach you, we will leave a message.  However, if you are feeling well and you are not experiencing any problems, there is no need to return our call.  We will assume that you have returned to your regular daily activities without incident.  If any biopsies were taken you will be contacted by phone or by letter within the next 1-3 weeks.  Please call us at [phone] if you have not heard about the biopsies in 3 weeks.    SIGNATURES/CONFIDENTIALITY: You and/or your care partner have signed paperwork which will be entered into your electronic medical record.  These signatures attest to the fact that that the information above on your After Visit Summary has been reviewed and is understood.  Full responsibility of the confidentiality of this discharge information lies with you and/or your care-partner. 

## 2015-08-15 NOTE — Progress Notes (Signed)
Patient awakening,vss,report to rn 

## 2015-08-15 NOTE — Progress Notes (Signed)
Called to room to assist during endoscopic procedure.  Patient ID and intended procedure confirmed with present staff. Received instructions for my participation in the procedure from the performing physician.  

## 2015-08-16 ENCOUNTER — Telehealth: Payer: Self-pay

## 2015-08-16 ENCOUNTER — Telehealth: Payer: Self-pay | Admitting: Family Medicine

## 2015-08-16 NOTE — Telephone Encounter (Signed)
  Follow up Call-  Call back number 08/15/2015 11/08/2013 09/13/2013  Post procedure Call Back phone  # [phone]  Permission to leave phone message Yes Yes Yes     Patient questions:  Do you have a fever, pain , or abdominal swelling? No. Pain Score  0 *  Have you tolerated food without any problems? Yes.    Have you been able to return to your normal activities? Yes.    Do you have any questions about your discharge instructions: Diet   No. Medications  No. Follow up visit  No.  Do you have questions or concerns about your Care? No.  Actions: * If pain score is 4 or above: No action needed, pain <4.

## 2015-08-16 NOTE — Telephone Encounter (Signed)
Appointment given for Monday with Dr. [name].

## 2015-08-19 ENCOUNTER — Encounter: Payer: Self-pay | Admitting: *Deleted

## 2015-08-19 ENCOUNTER — Encounter: Payer: Self-pay | Admitting: Family Medicine

## 2015-08-19 ENCOUNTER — Ambulatory Visit (INDEPENDENT_AMBULATORY_CARE_PROVIDER_SITE_OTHER): Payer: Medicare Other | Admitting: Family Medicine

## 2015-08-19 ENCOUNTER — Encounter (INDEPENDENT_AMBULATORY_CARE_PROVIDER_SITE_OTHER): Payer: Self-pay

## 2015-08-19 VITALS — BP 136/87 | HR 76 | Temp 98.3°F | Ht 62.0 in | Wt 117.0 lb

## 2015-08-19 DIAGNOSIS — Z4802 Encounter for removal of sutures: Secondary | ICD-10-CM

## 2015-08-19 DIAGNOSIS — L209 Atopic dermatitis, unspecified: Secondary | ICD-10-CM

## 2015-08-19 DIAGNOSIS — D489 Neoplasm of uncertain behavior, unspecified: Secondary | ICD-10-CM | POA: Diagnosis not present

## 2015-08-19 NOTE — Patient Instructions (Signed)
We will arrange for you to have an appointment with the dermatologist to look at your nasal lesion and skin rash on arms and legs if the rash is not better by the time he see them. In the meantime try the compounded mixture of Aristocort with a moisturizer and apply this twice daily to arms and legs sparingly for the next 10 days.

## 2015-08-19 NOTE — Progress Notes (Signed)
Subjective:    Patient ID: Rachel Walton, female    DOB: Dec 06, 1929, 82 y.o.   MRN: [id number]  HPI Patient here today for stitch removal - right lower leg. These were put in at her Dermatology clinic about 11 days ago.She also has a rash and she is not sure where this rash came from on her legs and arms. She attributed it to something she may have eaten that while the local restaurants. She also is thinking it could've come from some pork meat that she ate. She is not certain. This is a irritating rash on both arms and legs that is faint in appearance. She is also requesting a dermatology referral because of the possibility skin cancer on her nose and would like this looked into further. The patient indicates that she is using soaps or fabric softeners and detergents that are scent free.      Patient Active Problem List   Diagnosis Date Noted  . Esophageal stricture 12/11/2013  . Allergic rhinitis due to pollen 12/11/2013  . Essential hypertension 12/11/2013  . Generalized anxiety disorder 08/21/2013  . Gall stones 06/15/2013  . Hyperlipemia 11/21/2012  . Tremor   . Postmenopausal   . Colon polyp   . Osteopenia of the elderly   . Kyphosis   . Recurrent UTI   . Atrophic vaginitis   . Diverticulosis   . Meningitis, history of    Outpatient Encounter Prescriptions as of 08/19/2015  Medication Sig  . Cholecalciferol (VITAMIN D3) 2000 UNITS TABS Take 1 tablet by mouth 2 (two) times a week. As needed  . clonazePAM (KLONOPIN) 0.5 MG tablet Take 1 tablet (0.5 mg total) by mouth 2 (two) times daily as needed (stress).  . DiphenhydrAMINE HCl (BENADRYL ALLERGY PO) Take by mouth.  . fish oil-omega-3 fatty acids 1000 MG capsule Take 2 g by mouth daily.  . fluticasone (FLONASE) 50 MCG/ACT nasal spray as needed.   Marland Kitchen lisinopril (PRINIVIL,ZESTRIL) 10 MG tablet TAKE ONE (1) TABLET EACH DAY  . Multiple Vitamins-Minerals (ICAPS AREDS FORMULA PO) Take 1 tablet by mouth 2 (two) times daily before a  meal.   . naphazoline-pheniramine (NAPHCON-A) 0.025-0.3 % ophthalmic solution as needed.   . polyvinyl alcohol-povidone (REFRESH) 1.4-0.6 % ophthalmic solution Place 1-2 drops into both eyes as needed.   . rosuvastatin (CRESTOR) 5 MG tablet Take 1 tablet (5 mg total) by mouth at bedtime. (Patient taking differently: Take 5 mg by mouth at bedtime. 1/2 tablet)  . sodium chloride (OCEAN) 0.65 % SOLN nasal spray Place 1 spray into both nostrils as needed for congestion. Reported on 08/08/2015   No facility-administered encounter medications on file as of 08/19/2015.     Review of Systems  Constitutional: Negative.   HENT: Negative.   Eyes: Negative.   Respiratory: Negative.   Cardiovascular: Negative.   Gastrointestinal: Negative.   Endocrine: Negative.   Genitourinary: Negative.   Musculoskeletal: Negative.   Skin: Negative.        Left lower leg - stitches  Allergic/Immunologic: Negative.   Neurological: Negative.   Hematological: Negative.   Psychiatric/Behavioral: Negative.        Objective:   Physical Exam  Constitutional: She is oriented to person, place, and time. She appears well-developed and well-nourished. She appears distressed.  HENT:  Head: Normocephalic.  Eyes: Conjunctivae and EOM are normal. Pupils are equal, round, and reactive to light. Right eye exhibits no discharge. Left eye exhibits no discharge. No scleral icterus.  Neck: Normal range of  motion.  Cardiovascular: Regular rhythm.   Pulmonary/Chest: Effort normal and breath sounds normal.  Abdominal: Bowel sounds are normal. She exhibits no mass.  Musculoskeletal: Normal range of motion. She exhibits no edema.  Lymphadenopathy:    She has no cervical adenopathy.  Neurological: She is alert and oriented to person, place, and time.  Skin: Skin is warm and dry. Rash noted.  The patient also has a lesion on the left side of her nose and would like a referral to dermatology to have this further evaluated There is  a faint rash with dry skin on both upper extremities and both lower extremities. This is a nonspecific rash. The patient has recently visited the dermatologist and I soon had biopsies of 2 places on her right leg and sutures were removed from these biopsy sites. Also the skin is dry and somewhat scaly with splotches of redness that is faint.  Psychiatric: She has a normal mood and affect. Her behavior is normal. Judgment and thought content normal.  Nursing note and vitals reviewed.   BP 158/67 mmHg  Pulse 81  Temp(Src) 98.3 F (36.8 C) (Oral)  Ht 5' 2"  (1.575 m)  Wt 117 lb (53.071 kg)  BMI 21.39 kg/m2       Assessment & Plan:  1. Atopic dermatitis -A prescription was called to the pharmacy for Aristocort mixed with a moisturizer to apply twice daily to her extremities for 10 days - CBC with Differential/Platelet - BMP8+EGFR  2. Visit for suture removal -Sutures were removed without problems from the biopsy site on her leg  3. Neoplasm of uncertain behavior -She is referred to the dermatologist for further evaluation of the skin lesion of the nose.  Patient Instructions  We will arrange for you to have an appointment with the dermatologist to look at your nasal lesion and skin rash on arms and legs if the rash is not better by the time he see them. In the meantime try the compounded mixture of Aristocort with a moisturizer and apply this twice daily to arms and legs sparingly for the next 10 days.   Arrie Senate MD

## 2015-08-19 NOTE — Addendum Note (Signed)
Addended by: Zannie Cove on: 08/19/2015 12:36 PM   Modules accepted: Orders

## 2015-08-20 DIAGNOSIS — H02403 Unspecified ptosis of bilateral eyelids: Secondary | ICD-10-CM | POA: Insufficient documentation

## 2015-08-20 LAB — CBC WITH DIFFERENTIAL/PLATELET
BASOS: 1 %
Basophils Absolute: 0.1 10*3/uL (ref 0.0–0.2)
EOS (ABSOLUTE): 0.1 10*3/uL (ref 0.0–0.4)
EOS: 2 %
HEMATOCRIT: 41.2 % (ref 34.0–46.6)
Hemoglobin: 13.5 g/dL (ref 11.1–15.9)
IMMATURE GRANS (ABS): 0 10*3/uL (ref 0.0–0.1)
IMMATURE GRANULOCYTES: 0 %
LYMPHS: 35 %
Lymphocytes Absolute: 1.6 10*3/uL (ref 0.7–3.1)
MCH: 28.4 pg (ref 26.6–33.0)
MCHC: 32.8 g/dL (ref 31.5–35.7)
MCV: 87 fL (ref 79–97)
Monocytes Absolute: 0.3 10*3/uL (ref 0.1–0.9)
Monocytes: 7 %
NEUTROS PCT: 55 %
Neutrophils Absolute: 2.5 10*3/uL (ref 1.4–7.0)
PLATELETS: 238 10*3/uL (ref 150–379)
RBC: 4.75 x10E6/uL (ref 3.77–5.28)
RDW: 13.5 % (ref 12.3–15.4)
WBC: 4.5 10*3/uL (ref 3.4–10.8)

## 2015-08-20 LAB — BMP8+EGFR
BUN/Creatinine Ratio: 7 — ABNORMAL LOW (ref 12–28)
BUN: 5 mg/dL — AB (ref 8–27)
CALCIUM: 8.9 mg/dL (ref 8.7–10.3)
CHLORIDE: 100 mmol/L (ref 96–106)
CO2: 29 mmol/L (ref 18–29)
Creatinine, Ser: 0.68 mg/dL (ref 0.57–1.00)
GFR calc non Af Amer: 81 mL/min/{1.73_m2} (ref >59)
GFR, EST AFRICAN AMERICAN: 94 mL/min/{1.73_m2} (ref >59)
Glucose: 90 mg/dL (ref 65–99)
POTASSIUM: 3 mmol/L — AB (ref 3.5–5.2)
Sodium: 144 mmol/L (ref 134–144)

## 2015-08-22 ENCOUNTER — Encounter: Payer: Self-pay | Admitting: *Deleted

## 2015-08-23 LAB — ANA: Anti Nuclear Antibody(ANA): POSITIVE — AB

## 2015-08-23 LAB — PROTEIN ELECTROPHORESIS
A/G Ratio: 1.2 (ref 0.7–1.7)
ALPHA 1: 0.2 g/dL (ref 0.0–0.4)
Albumin ELP: 3.3 g/dL (ref 2.9–4.4)
Alpha 2: 0.7 g/dL (ref 0.4–1.0)
Beta: 1 g/dL (ref 0.7–1.3)
GLOBULIN, TOTAL: 2.7 g/dL (ref 2.2–3.9)
Gamma Globulin: 0.8 g/dL (ref 0.4–1.8)
TOTAL PROTEIN: 6 g/dL (ref 6.0–8.5)

## 2015-08-23 LAB — HEPATIC FUNCTION PANEL
ALBUMIN: 3.9 g/dL (ref 3.5–4.7)
ALT: 12 IU/L (ref 0–32)
AST: 19 IU/L (ref 0–40)
Alkaline Phosphatase: 57 IU/L (ref 39–117)
Bilirubin, Direct: 0.08 mg/dL (ref 0.00–0.40)

## 2015-08-23 LAB — SPECIMEN STATUS REPORT

## 2015-08-23 LAB — SEDIMENTATION RATE: Sed Rate: 2 mm/hr (ref 0–40)

## 2015-08-23 LAB — C-REACTIVE PROTEIN: CRP: 0.3 mg/L (ref 0.0–4.9)

## 2015-08-28 ENCOUNTER — Other Ambulatory Visit: Payer: Self-pay

## 2015-08-28 MED ORDER — BUDESONIDE 3 MG PO CPEP: ORAL_CAPSULE | ORAL | Status: DC

## 2015-09-03 ENCOUNTER — Encounter: Payer: Medicare Other | Admitting: Internal Medicine

## 2015-09-07 ENCOUNTER — Emergency Department (HOSPITAL_COMMUNITY): Payer: Medicare Other

## 2015-09-07 ENCOUNTER — Encounter (HOSPITAL_COMMUNITY): Payer: Self-pay | Admitting: Emergency Medicine

## 2015-09-07 ENCOUNTER — Emergency Department (HOSPITAL_COMMUNITY)
Admission: EM | Admit: 2015-09-07 | Discharge: 2015-09-07 | Disposition: A | Discharge status: Home / Self Care | Payer: Medicare Other | Attending: Emergency Medicine | Admitting: Emergency Medicine

## 2015-09-07 DIAGNOSIS — E785 Hyperlipidemia, unspecified: Secondary | ICD-10-CM | POA: Insufficient documentation

## 2015-09-07 DIAGNOSIS — N3289 Other specified disorders of bladder: Secondary | ICD-10-CM

## 2015-09-07 DIAGNOSIS — Z79899 Other long term (current) drug therapy: Secondary | ICD-10-CM | POA: Insufficient documentation

## 2015-09-07 DIAGNOSIS — R319 Hematuria, unspecified: Secondary | ICD-10-CM | POA: Diagnosis not present

## 2015-09-07 LAB — URINE MICROSCOPIC-ADD ON

## 2015-09-07 LAB — URINALYSIS, ROUTINE W REFLEX MICROSCOPIC
Bilirubin Urine: NEGATIVE
GLUCOSE, UA: NEGATIVE mg/dL
Ketones, ur: NEGATIVE mg/dL
Nitrite: NEGATIVE
PH: 7.5 (ref 5.0–8.0)
PROTEIN: 100 mg/dL — AB
Specific Gravity, Urine: <1.005 — ABNORMAL LOW (ref 1.005–1.030)

## 2015-09-07 MED ORDER — CIPROFLOXACIN HCL 500 MG PO TABS: 500.0000 mg | ORAL_TABLET | Freq: Two times a day (BID) | ORAL | Status: DC

## 2015-09-07 NOTE — ED Notes (Signed)
PA at bedside.

## 2015-09-07 NOTE — ED Notes (Signed)
Patient c/o blood in urine. Per patient has urinated x7 this morning-noting large amount of blood x1 with small amounts of blood in urine with every void. Patient reports frequency with pressure in pelvic region.

## 2015-09-07 NOTE — ED Provider Notes (Signed)
CSN: [id number]     Arrival date & time 09/07/15  [id number] History   First MD Initiated Contact with Patient 09/07/15 0825     Chief Complaint  Patient presents with  . Hematuria     (Consider location/radiation/quality/duration/timing/severity/associated sxs/prior Treatment) Patient is a 82 y.o. female presenting with hematuria. The history is provided by the patient.  Hematuria This is a new problem. The current episode started today. The problem has been gradually worsening. Associated symptoms include abdominal pain. Pertinent negatives include no arthralgias, chest pain, chills, coughing, fever or neck pain. Nothing aggravates the symptoms. She has tried nothing for the symptoms. The treatment provided no relief.    Past Medical History  Diagnosis Date  . Tremor   . Postmenopausal   . Hx of adenomatous colonic polyps   . Kyphosis   . Recurrent UTI   . Atrophic vaginitis   . Diverticulosis   . 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 Cataract   . Hyperlipidemia   . Diverticulosis   . Osteopenia of the elderly   . Macular degeneration   . Breast cyst   . IBS (irritable bowel syndrome)   . Vitamin D deficiency   . Acute gastric ulcer   . Esophageal stricture   . Fatty liver   . Hiatal hernia   . Allergy   . Anxiety   . 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 Barrett's esophagus    Past Surgical History  Procedure Laterality Date  . Back surgery    . Appendectomy    . Cataract surgery    . Eye surgery    . Breast surgery      cyst removal  . Abdominal hysterectomy  1980  . Colonoscopy    . Polypectomy     Family History  Problem Relation Age of Onset  . Colon cancer      nephew  . Stroke Mother   . Heart disease Father     MI  . Cancer Sister     Brain tumor  . Hypertension Sister   . Alcohol abuse Sister   . Colon cancer Brother   . Heart disease Brother   . Diabetes Son   . Diabetes Maternal Uncle   . Diabetes  Son   . Hypertension Sister   . Breast cancer Sister   . Hypertension Sister   . Hypertension Sister   . Hypertension Sister   . Stroke Sister   . Hypertension Sister   . Hypertension Sister   . Brain cancer Son     deceased  . Colon polyps Neg Hx   . Esophageal cancer Neg Hx   . Rectal cancer Neg Hx   . Stomach cancer Neg Hx   . Thyroid cancer Other     niece  . Breast cancer Other     nieces x 3    Social History  Substance Use Topics  . Smoking status: Never Smoker   . Smokeless tobacco: Never Used  . Alcohol Use: No   OB History    No data available     Review of Systems  Constitutional: Negative for fever, chills and activity change.       All ROS Neg except as noted in HPI  HENT: Negative for nosebleeds.   Eyes: Negative for photophobia and discharge.  Respiratory: Negative for cough, shortness of breath and wheezing.   Cardiovascular: Negative for chest pain  and palpitations.  Gastrointestinal: Positive for abdominal pain. Negative for blood in stool.  Genitourinary: Positive for dysuria and hematuria. Negative for frequency.  Musculoskeletal: Negative for back pain, arthralgias and neck pain.  Skin: Negative.   Neurological: Negative for dizziness, seizures and speech difficulty.  Psychiatric/Behavioral: Negative for hallucinations and confusion.  All other systems reviewed and are negative.     Allergies  Alendronate sodium; Amoxicillin; Cephalexin; Prednisone; Sudafed; Sulfa antibiotics; Toprol xl; and Trimethoprim  Home Medications   Prior to Admission medications   Medication Sig Start Date End Date Taking? Authorizing Provider  Cholecalciferol (VITAMIN D3) 2000 UNITS TABS Take 1 tablet by mouth 2 (two) times a week. As needed   Yes Historical Provider, MD  clonazePAM (KLONOPIN) 0.5 MG tablet Take 1 tablet (0.5 mg total) by mouth 2 (two) times daily as needed (stress). 06/05/15  Yes Chipper Herb, MD  fish oil-omega-3 fatty acids 1000 MG capsule  Take 2 g by mouth daily.   Yes Historical Provider, MD  fluticasone (FLONASE) 50 MCG/ACT nasal spray Place 1 spray into both nostrils daily as needed for rhinitis.  04/10/14  Yes Historical Provider, MD  lisinopril (PRINIVIL,ZESTRIL) 10 MG tablet TAKE ONE (1) TABLET EACH DAY 07/17/15  Yes Chipper Herb, MD  Multiple Vitamins-Minerals (ICAPS AREDS FORMULA PO) Take 1 tablet by mouth 2 (two) times daily before a meal.    Yes Historical Provider, MD  polyvinyl alcohol-povidone (REFRESH) 1.4-0.6 % ophthalmic solution Place 1-2 drops into both eyes as needed.    Yes Historical Provider, MD  rosuvastatin (CRESTOR) 5 MG tablet Take 1 tablet (5 mg total) by mouth at bedtime. Patient taking differently: Take 5 mg by mouth at bedtime. 1/2 tablet 12/21/14  Yes Chipper Herb, MD   BP 130/78 mmHg  Pulse 63  Temp(Src) 98.4 F (36.9 C) (Oral)  Resp 18  Ht 5\' 2"  (1.575 m)  Wt 50.349 kg  BMI 20.30 kg/m2  SpO2 98% Physical Exam  Constitutional: She is oriented to person, place, and time. She appears well-developed and well-nourished.  Non-toxic appearance.  HENT:  Head: Normocephalic.  Right Ear: Tympanic membrane and external ear normal.  Left Ear: Tympanic membrane and external ear normal.  Eyes: EOM and lids are normal. Pupils are equal, round, and reactive to light.  Neck: Normal range of motion. Neck supple. Carotid bruit is not present.  Cardiovascular: Normal rate, regular rhythm, normal heart sounds, intact distal pulses and normal pulses.   Pulmonary/Chest: Breath sounds normal. No respiratory distress.  Abdominal: Soft. Bowel sounds are normal. There is tenderness in the suprapubic area. There is no guarding.  Musculoskeletal: Normal range of motion.  Lymphadenopathy:       Head (right side): No submandibular adenopathy present.       Head (left side): No submandibular adenopathy present.    She has no cervical adenopathy.  Neurological: She is alert and oriented to person, place, and time. She  has normal strength. No cranial nerve deficit or sensory deficit.  Skin: Skin is warm and dry.  Psychiatric: She has a normal mood and affect. Her speech is normal.  Nursing note and vitals reviewed.   ED Course Pt seen with me by Dr Sabra Heck.  Procedures (including critical care time) Labs Review Labs Reviewed  URINALYSIS, ROUTINE W REFLEX MICROSCOPIC (NOT AT [facility])    Imaging Review No results found. I have personally reviewed and evaluated these images and lab results as part of my medical decision-making.   EKG Interpretation  None      MDM  Vital signs stable.  UA reveals a red specimen with large blood and mod leukocytes. TMTC RBC's 0-5 wbc's. Will obtain CT stone study. Culture sent to the lab.  CT suggest no kidney stone, but a 2 cm mass of the posterior wall of the bladder. Will cover patient for possible UTI and refer for urology consultation.   Final diagnoses:  None    **I have reviewed nursing notes, vital signs, and all appropriate lab and imaging results for this patient.Lily Kocher, PA-C 09/07/15 [place], MD 09/08/15 [phone]

## 2015-09-07 NOTE — ED Notes (Signed)
PT c/o blood in urine with increased urinary frequency with recurrent UTI hx.

## 2015-09-07 NOTE — Discharge Instructions (Signed)
Your CT scan suggest a small mass of the wall of the bladder. Your urine test suggest microscopic blood present. Please use cipro 2 times daily with food. See MD at the [facility] as soon as possible next week. Please return if any excess bleeding or pain. Hematuria, Adult Hematuria is blood in your urine. It can be caused by a bladder infection, kidney infection, prostate infection, kidney stone, or cancer of your urinary tract. Infections can usually be treated with medicine, and a kidney stone usually will pass through your urine. If neither of these is the cause of your hematuria, further workup to find out the reason may be needed. It is very important that you tell your health care provider about any blood you see in your urine, even if the blood stops without treatment or happens without causing pain. Blood in your urine that happens and then stops and then happens again can be a symptom of a very serious condition. Also, pain is not a symptom in the initial stages of many urinary cancers. HOME CARE INSTRUCTIONS   Drink lots of fluid, 3-4 quarts a day. If you have been diagnosed with an infection, cranberry juice is especially recommended, in addition to large amounts of water.  Avoid caffeine, tea, and carbonated beverages because they tend to irritate the bladder.  Avoid alcohol because it may irritate the prostate.  Take all medicines as directed by your health care provider.  If you were prescribed an antibiotic medicine, finish it all even if you start to feel better.  If you have been diagnosed with a kidney stone, follow your health care provider's instructions regarding straining your urine to catch the stone.  Empty your bladder often. Avoid holding urine for long periods of time.  After a bowel movement, women should cleanse front to back. Use each tissue only once.  Empty your bladder before and after sexual intercourse if you are a female. SEEK MEDICAL CARE  IF:  You develop back pain.  You have a fever.  You have a feeling of sickness in your stomach (nausea) or vomiting.  Your symptoms are not better in 3 days. Return sooner if you are getting worse. SEEK IMMEDIATE MEDICAL CARE IF:   You develop severe vomiting and are unable to keep the medicine down.  You develop severe back or abdominal pain despite taking your medicines.  You begin passing a large amount of blood or clots in your urine.  You feel extremely weak or faint, or you pass out. MAKE SURE YOU:   Understand these instructions.  Will watch your condition.  Will get help right away if you are not doing well or get worse.   This information is not intended to replace advice given to you by your health care provider. Make sure you discuss any questions you have with your health care provider.   Document Released: 04/27/2005 Document Revised: 05/18/2014 Document Reviewed: 12/26/2012 Elsevier Interactive Patient Education [employer].

## 2015-09-09 ENCOUNTER — Telehealth: Payer: Self-pay | Admitting: Family Medicine

## 2015-09-09 DIAGNOSIS — R319 Hematuria, unspecified: Secondary | ICD-10-CM

## 2015-09-09 LAB — URINE CULTURE: Special Requests: NORMAL

## 2015-09-09 NOTE — Telephone Encounter (Signed)
Okay to refer to urology for hematuria

## 2015-09-09 NOTE — Telephone Encounter (Signed)
Patient aware of referral

## 2015-09-27 ENCOUNTER — Ambulatory Visit: Payer: Medicare Other | Admitting: Family Medicine

## 2015-09-30 ENCOUNTER — Encounter: Payer: Self-pay | Admitting: Family Medicine

## 2015-09-30 ENCOUNTER — Ambulatory Visit (INDEPENDENT_AMBULATORY_CARE_PROVIDER_SITE_OTHER): Payer: Medicare Other | Admitting: Family Medicine

## 2015-09-30 VITALS — BP 160/76 | HR 63 | Temp 96.6°F | Ht 62.0 in | Wt 115.6 lb

## 2015-09-30 DIAGNOSIS — F4323 Adjustment disorder with mixed anxiety and depressed mood: Secondary | ICD-10-CM

## 2015-09-30 DIAGNOSIS — R319 Hematuria, unspecified: Secondary | ICD-10-CM | POA: Diagnosis not present

## 2015-09-30 DIAGNOSIS — I10 Essential (primary) hypertension: Secondary | ICD-10-CM

## 2015-09-30 DIAGNOSIS — E559 Vitamin D deficiency, unspecified: Secondary | ICD-10-CM | POA: Diagnosis not present

## 2015-09-30 DIAGNOSIS — E785 Hyperlipidemia, unspecified: Secondary | ICD-10-CM | POA: Diagnosis not present

## 2015-09-30 DIAGNOSIS — E876 Hypokalemia: Secondary | ICD-10-CM

## 2015-09-30 LAB — URINALYSIS, COMPLETE
Bilirubin, UA: NEGATIVE
GLUCOSE, UA: NEGATIVE
Ketones, UA: NEGATIVE
NITRITE UA: NEGATIVE
Protein, UA: NEGATIVE
Specific Gravity, UA: 1.01 (ref 1.005–1.030)
UUROB: 0.2 mg/dL (ref 0.2–1.0)
pH, UA: 7 (ref 5.0–7.5)

## 2015-09-30 LAB — MICROSCOPIC EXAMINATION

## 2015-09-30 NOTE — Patient Instructions (Signed)
The patient should continue to follow-up with the gastroenterologist as planned for an endoscopy. She should follow-up with the urologist as per his direction

## 2015-09-30 NOTE — Progress Notes (Signed)
Subjective:    Patient ID: Rachel Walton, female    DOB: 1929/12/07, 82 y.o.   MRN: [id number]  HPI Patient is here today for a 4 month follow up on her chronic medical problems hyperlipidemia and hypertension. She is also complaining with weakness. The patient went to the emergency room in [place] and late April because of hematuria and chest CT scan of the bladder which showed a 2 cm mass in the bladder and she was scheduled to see a urologist about this. She did see the urologist, Dr. Jeffie Pollock and he started her on Cipro which she is already finished and did a cystoscopy and reassured her that she did not need to come back unless she had further problems. This is according to her history. We will try to get a note from him confirming all this. She's had a colonoscopy by Dr. Elmo Putt and this revealed diverticulosis. She complains of weakness today. She will get a chest x-ray today and will get lab work today. The patient does have these weak spells and fatigue. She says that when she takes the Klonopin that she feels better. She is he takes about a half a one a day. I reassured her that this was okay. She does state extremely nervous. She worries about a lot of things including her husband and she lost her son in the past year. She denies any chest pain but does have some shortness of breath. She has no trouble with heartburn indigestion nausea vomiting diarrhea or blood in the stool or black tarry bowel movements. She says her bowel movements are better and of course she did have a colonoscopy indicating only diverticulosis. She's had no more blood in the urine that she has seen.   Review of Systems  Constitutional: Positive for fatigue.  HENT: Negative.   Eyes: Negative.   Respiratory: Negative.   Cardiovascular: Negative.   Gastrointestinal: Negative.   Endocrine: Negative.   Genitourinary: Negative.   Musculoskeletal: Negative.   Skin: Negative.   Allergic/Immunologic: Negative.     Neurological: Negative.   Hematological: Negative.   Psychiatric/Behavioral: Negative.        Patient Active Problem List   Diagnosis Date Noted  . Esophageal stricture 12/11/2013  . Allergic rhinitis due to pollen 12/11/2013  . Essential hypertension 12/11/2013  . Generalized anxiety disorder 08/21/2013  . Gall stones 06/15/2013  . Hyperlipemia 11/21/2012  . Tremor   . Postmenopausal   . Colon polyp   . Osteopenia of the elderly   . Kyphosis   . Recurrent UTI   . Atrophic vaginitis   . Diverticulosis   . Meningitis, history of    Outpatient Encounter Prescriptions as of 09/30/2015  Medication Sig  . Cholecalciferol (VITAMIN D3) 2000 UNITS TABS Take 1 tablet by mouth 2 (two) times a week. As needed  . clonazePAM (KLONOPIN) 0.5 MG tablet Take 1 tablet (0.5 mg total) by mouth 2 (two) times daily as needed (stress).  . fish oil-omega-3 fatty acids 1000 MG capsule Take 2 g by mouth daily.  . fluticasone (FLONASE) 50 MCG/ACT nasal spray Place 1 spray into both nostrils daily as needed for rhinitis.   Marland Kitchen lisinopril (PRINIVIL,ZESTRIL) 10 MG tablet TAKE ONE (1) TABLET EACH DAY  . Multiple Vitamins-Minerals (ICAPS AREDS FORMULA PO) Take 1 tablet by mouth 2 (two) times daily before a meal.   . rosuvastatin (CRESTOR) 5 MG tablet Take 1 tablet (5 mg total) by mouth at bedtime. (Patient taking differently: Take 5 mg  by mouth at bedtime. 1/2 tablet)  . [DISCONTINUED] ciprofloxacin (CIPRO) 500 MG tablet Take 1 tablet (500 mg total) by mouth 2 (two) times daily.  . [DISCONTINUED] fluorouracil (EFUDEX) 5 % cream Apply 1 application topically 2 (two) times daily.  . [DISCONTINUED] polyvinyl alcohol-povidone (REFRESH) 1.4-0.6 % ophthalmic solution Place 1-2 drops into both eyes as needed.   . [DISCONTINUED] triamcinolone cream (KENALOG) 0.1 %    No facility-administered encounter medications on file as of 09/30/2015.       Objective:   Physical Exam  Constitutional: She is oriented to  person, place, and time. She appears well-developed and well-nourished. No distress.  Alert and calmer than usual.  HENT:  Head: Normocephalic and atraumatic.  Right Ear: External ear normal.  Left Ear: External ear normal.  Nose: Nose normal.  Mouth/Throat: Oropharynx is clear and moist.  Eyes: Conjunctivae and EOM are normal. Pupils are equal, round, and reactive to light. Right eye exhibits no discharge. Left eye exhibits no discharge. No scleral icterus.  Neck: Normal range of motion. Neck supple. No thyromegaly present.  Without bruits thyromegaly or anterior cervical adenopathy  Cardiovascular: Normal rate, regular rhythm, normal heart sounds and intact distal pulses.   No murmur heard. Heart was regular at about 72/m  Pulmonary/Chest: Effort normal and breath sounds normal. No respiratory distress. She has no wheezes. She has no rales. She exhibits no tenderness.  Clear anteriorly and posteriorly  Abdominal: Soft. Bowel sounds are normal. She exhibits no mass. There is no tenderness. There is no rebound and no guarding.  Some slight generalized abdominal tenderness without masses or organ enlargement or bruits  Musculoskeletal: Normal range of motion. She exhibits no edema.  Lymphadenopathy:    She has no cervical adenopathy.  Neurological: She is alert and oriented to person, place, and time. She has normal reflexes. No cranial nerve deficit.  Skin: Skin is warm and dry. No rash noted.  Psychiatric: She has a normal mood and affect. Her behavior is normal. Judgment and thought content normal.  Nursing note and vitals reviewed.   BP 160/76 mmHg  Pulse 63  Temp(Src) 96.6 F (35.9 C) (Oral)  Ht 5' 2" (1.575 m)  Wt 115 lb 9.6 oz (52.436 kg)  BMI 21.14 kg/m2       Assessment & Plan:  1. Hematuria -Continue to follow-up with urology if needed - Urinalysis, Complete - Urine culture  2. Situational mixed anxiety and depressive disorder -Continue with low-dose clonazepam  as needed  3. Essential hypertension -The blood pressure is somewhat elevated today and we will continue to monitor this. - CBC with Differential/Platelet - BMP8+EGFR  4. Vitamin D deficiency -Continue current treatment pending results of lab work  5. Hyperlipidemia -Continue with Crestor and aggressive therapeutic lifestyle changes - Lipid panel - Hepatic function panel  6. Esophageal stricture -Follow-up with gastroenterologist as planned and according to patient and endoscopy is being contemplated  Patient Instructions  The patient should continue to follow-up with the gastroenterologist as planned for an endoscopy. She should follow-up with the urologist as per his direction   Arrie Senate MD

## 2015-10-01 LAB — BMP8+EGFR
BUN / CREAT RATIO: 10 — AB (ref 12–28)
BUN: 6 mg/dL — ABNORMAL LOW (ref 8–27)
CHLORIDE: 101 mmol/L (ref 96–106)
CO2: 28 mmol/L (ref 18–29)
Calcium: 8.5 mg/dL — ABNORMAL LOW (ref 8.7–10.3)
Creatinine, Ser: 0.6 mg/dL (ref 0.57–1.00)
GFR calc non Af Amer: 85 mL/min/{1.73_m2} (ref >59)
GFR, EST AFRICAN AMERICAN: 97 mL/min/{1.73_m2} (ref >59)
GLUCOSE: 89 mg/dL (ref 65–99)
POTASSIUM: 3.2 mmol/L — AB (ref 3.5–5.2)
SODIUM: 144 mmol/L (ref 134–144)

## 2015-10-01 LAB — CBC WITH DIFFERENTIAL/PLATELET
BASOS: 1 %
Basophils Absolute: 0 10*3/uL (ref 0.0–0.2)
EOS (ABSOLUTE): 0.1 10*3/uL (ref 0.0–0.4)
EOS: 2 %
HEMATOCRIT: 37.5 % (ref 34.0–46.6)
HEMOGLOBIN: 12.3 g/dL (ref 11.1–15.9)
IMMATURE GRANS (ABS): 0 10*3/uL (ref 0.0–0.1)
IMMATURE GRANULOCYTES: 0 %
Lymphocytes Absolute: 2.5 10*3/uL (ref 0.7–3.1)
Lymphs: 48 %
MCH: 28.8 pg (ref 26.6–33.0)
MCHC: 32.8 g/dL (ref 31.5–35.7)
MCV: 88 fL (ref 79–97)
MONOCYTES: 9 %
MONOS ABS: 0.5 10*3/uL (ref 0.1–0.9)
NEUTROS PCT: 40 %
Neutrophils Absolute: 2.1 10*3/uL (ref 1.4–7.0)
Platelets: 238 10*3/uL (ref 150–379)
RBC: 4.27 x10E6/uL (ref 3.77–5.28)
RDW: 14.2 % (ref 12.3–15.4)
WBC: 5.1 10*3/uL (ref 3.4–10.8)

## 2015-10-01 LAB — HEPATIC FUNCTION PANEL
ALT: 17 IU/L (ref 0–32)
AST: 21 IU/L (ref 0–40)
Albumin: 3.6 g/dL (ref 3.5–4.7)
Alkaline Phosphatase: 49 IU/L (ref 39–117)
BILIRUBIN, DIRECT: 0.08 mg/dL (ref 0.00–0.40)
Bilirubin Total: 0.2 mg/dL (ref 0.0–1.2)
TOTAL PROTEIN: 5.6 g/dL — AB (ref 6.0–8.5)

## 2015-10-01 LAB — LIPID PANEL
CHOL/HDL RATIO: 2.7 ratio (ref 0.0–4.4)
Cholesterol, Total: 270 mg/dL — ABNORMAL HIGH (ref 100–199)
HDL: 100 mg/dL (ref >39)
LDL CALC: 145 mg/dL — AB (ref 0–99)
TRIGLYCERIDES: 124 mg/dL (ref 0–149)
VLDL CHOLESTEROL CAL: 25 mg/dL (ref 5–40)

## 2015-10-02 ENCOUNTER — Other Ambulatory Visit: Payer: Medicare Other

## 2015-10-02 DIAGNOSIS — R319 Hematuria, unspecified: Secondary | ICD-10-CM

## 2015-10-02 DIAGNOSIS — Z1211 Encounter for screening for malignant neoplasm of colon: Secondary | ICD-10-CM

## 2015-10-03 ENCOUNTER — Other Ambulatory Visit: Payer: Medicare Other

## 2015-10-03 LAB — MICROSCOPIC EXAMINATION: WBC, UA: >30 /hpf — AB (ref 0–?)

## 2015-10-03 LAB — URINALYSIS, COMPLETE
BILIRUBIN UA: NEGATIVE
GLUCOSE, UA: NEGATIVE
KETONES UA: NEGATIVE
NITRITE UA: NEGATIVE
Protein, UA: NEGATIVE
RBC UA: NEGATIVE
SPEC GRAV UA: 1.015 (ref 1.005–1.030)
Urobilinogen, Ur: 0.2 mg/dL (ref 0.2–1.0)
pH, UA: 6 (ref 5.0–7.5)

## 2015-10-03 LAB — FECAL OCCULT BLOOD, IMMUNOCHEMICAL: Fecal Occult Bld: NEGATIVE

## 2015-10-03 MED ORDER — CIPROFLOXACIN HCL 500 MG PO TABS: 500.0000 mg | ORAL_TABLET | Freq: Two times a day (BID) | ORAL | Status: DC

## 2015-10-03 MED ORDER — POTASSIUM CHLORIDE ER 10 MEQ PO TBCR: 10.0000 meq | EXTENDED_RELEASE_TABLET | Freq: Every day | ORAL | Status: DC

## 2015-10-03 NOTE — Addendum Note (Signed)
Addended by: Thana Ates on: 10/03/2015 01:08 PM   Modules accepted: Orders

## 2015-10-04 LAB — URINE CULTURE

## 2015-10-09 ENCOUNTER — Emergency Department (HOSPITAL_COMMUNITY): Payer: Medicare Other

## 2015-10-09 ENCOUNTER — Ambulatory Visit (INDEPENDENT_AMBULATORY_CARE_PROVIDER_SITE_OTHER): Payer: Medicare Other | Admitting: Family Medicine

## 2015-10-09 ENCOUNTER — Observation Stay (HOSPITAL_COMMUNITY)
Admission: EM | Admit: 2015-10-09 | Discharge: 2015-10-10 | Disposition: A | Discharge status: Home / Self Care | Payer: Medicare Other | Attending: Internal Medicine | Admitting: Internal Medicine

## 2015-10-09 ENCOUNTER — Encounter (HOSPITAL_COMMUNITY): Payer: Self-pay

## 2015-10-09 VITALS — BP 185/77 | HR 80 | Temp 98.4°F | Ht 62.0 in

## 2015-10-09 DIAGNOSIS — Z79899 Other long term (current) drug therapy: Secondary | ICD-10-CM | POA: Insufficient documentation

## 2015-10-09 DIAGNOSIS — H3530 Unspecified macular degeneration: Secondary | ICD-10-CM | POA: Insufficient documentation

## 2015-10-09 DIAGNOSIS — R011 Cardiac murmur, unspecified: Secondary | ICD-10-CM | POA: Insufficient documentation

## 2015-10-09 DIAGNOSIS — E559 Vitamin D deficiency, unspecified: Secondary | ICD-10-CM | POA: Insufficient documentation

## 2015-10-09 DIAGNOSIS — K449 Diaphragmatic hernia without obstruction or gangrene: Secondary | ICD-10-CM | POA: Diagnosis not present

## 2015-10-09 DIAGNOSIS — D649 Anemia, unspecified: Secondary | ICD-10-CM | POA: Diagnosis present

## 2015-10-09 DIAGNOSIS — I10 Essential (primary) hypertension: Secondary | ICD-10-CM | POA: Diagnosis present

## 2015-10-09 DIAGNOSIS — E785 Hyperlipidemia, unspecified: Secondary | ICD-10-CM | POA: Insufficient documentation

## 2015-10-09 DIAGNOSIS — N952 Postmenopausal atrophic vaginitis: Secondary | ICD-10-CM | POA: Insufficient documentation

## 2015-10-09 DIAGNOSIS — G0490 Encephalitis and encephalomyelitis, unspecified: Secondary | ICD-10-CM | POA: Diagnosis not present

## 2015-10-09 DIAGNOSIS — F329 Major depressive disorder, single episode, unspecified: Secondary | ICD-10-CM | POA: Diagnosis not present

## 2015-10-09 DIAGNOSIS — R079 Chest pain, unspecified: Secondary | ICD-10-CM | POA: Diagnosis not present

## 2015-10-09 DIAGNOSIS — K760 Fatty (change of) liver, not elsewhere classified: Secondary | ICD-10-CM | POA: Insufficient documentation

## 2015-10-09 DIAGNOSIS — E782 Mixed hyperlipidemia: Secondary | ICD-10-CM | POA: Diagnosis present

## 2015-10-09 DIAGNOSIS — F419 Anxiety disorder, unspecified: Secondary | ICD-10-CM | POA: Diagnosis not present

## 2015-10-09 DIAGNOSIS — E876 Hypokalemia: Secondary | ICD-10-CM | POA: Diagnosis present

## 2015-10-09 DIAGNOSIS — R0789 Other chest pain: Secondary | ICD-10-CM

## 2015-10-09 DIAGNOSIS — N390 Urinary tract infection, site not specified: Secondary | ICD-10-CM | POA: Diagnosis present

## 2015-10-09 DIAGNOSIS — K253 Acute gastric ulcer without hemorrhage or perforation: Secondary | ICD-10-CM | POA: Diagnosis not present

## 2015-10-09 DIAGNOSIS — K219 Gastro-esophageal reflux disease without esophagitis: Secondary | ICD-10-CM | POA: Diagnosis not present

## 2015-10-09 DIAGNOSIS — Z880 Allergy status to penicillin: Secondary | ICD-10-CM | POA: Diagnosis not present

## 2015-10-09 DIAGNOSIS — K2210 Ulcer of esophagus without bleeding: Secondary | ICD-10-CM | POA: Diagnosis not present

## 2015-10-09 DIAGNOSIS — F411 Generalized anxiety disorder: Secondary | ICD-10-CM | POA: Diagnosis present

## 2015-10-09 DIAGNOSIS — K589 Irritable bowel syndrome without diarrhea: Secondary | ICD-10-CM | POA: Insufficient documentation

## 2015-10-09 DIAGNOSIS — M40209 Unspecified kyphosis, site unspecified: Secondary | ICD-10-CM | POA: Diagnosis not present

## 2015-10-09 DIAGNOSIS — K222 Esophageal obstruction: Secondary | ICD-10-CM | POA: Insufficient documentation

## 2015-10-09 DIAGNOSIS — I160 Hypertensive urgency: Secondary | ICD-10-CM | POA: Diagnosis not present

## 2015-10-09 HISTORY — DX: Ulcer of esophagus without bleeding: K22.10

## 2015-10-09 HISTORY — DX: Essential (primary) hypertension: I10

## 2015-10-09 HISTORY — DX: Depression, unspecified: F32.A

## 2015-10-09 HISTORY — DX: Basal cell carcinoma of skin of nose: C44.311

## 2015-10-09 HISTORY — DX: Cardiac murmur, unspecified: R01.1

## 2015-10-09 HISTORY — DX: Gastro-esophageal reflux disease without esophagitis: K21.9

## 2015-10-09 HISTORY — DX: Squamous cell carcinoma of skin of nose: C44.321

## 2015-10-09 HISTORY — DX: Pneumonia, unspecified organism: J18.9

## 2015-10-09 HISTORY — DX: Major depressive disorder, single episode, unspecified: F32.9

## 2015-10-09 LAB — CBC WITH DIFFERENTIAL/PLATELET
Basophils Absolute: 0.1 10*3/uL (ref 0.0–0.1)
Basophils Relative: 1 %
EOS PCT: 2 %
Eosinophils Absolute: 0.1 10*3/uL (ref 0.0–0.7)
HCT: 36.1 % (ref 36.0–46.0)
Hemoglobin: 11.8 g/dL — ABNORMAL LOW (ref 12.0–15.0)
LYMPHS ABS: 2.1 10*3/uL (ref 0.7–4.0)
LYMPHS PCT: 45 %
MCH: 28.3 pg (ref 26.0–34.0)
MCHC: 32.7 g/dL (ref 30.0–36.0)
MCV: 86.6 fL (ref 78.0–100.0)
MONO ABS: 0.3 10*3/uL (ref 0.1–1.0)
MONOS PCT: 7 %
Neutro Abs: 2.1 10*3/uL (ref 1.7–7.7)
Neutrophils Relative %: 45 %
PLATELETS: 180 10*3/uL (ref 150–400)
RBC: 4.17 MIL/uL (ref 3.87–5.11)
RDW: 13.5 % (ref 11.5–15.5)
WBC: 4.6 10*3/uL (ref 4.0–10.5)

## 2015-10-09 LAB — BASIC METABOLIC PANEL
Anion gap: 7 (ref 5–15)
BUN: 6 mg/dL (ref 6–20)
CHLORIDE: 105 mmol/L (ref 101–111)
CO2: 27 mmol/L (ref 22–32)
Calcium: 8.6 mg/dL — ABNORMAL LOW (ref 8.9–10.3)
Creatinine, Ser: 0.74 mg/dL (ref 0.44–1.00)
GFR calc Af Amer: >60 mL/min (ref >60)
GLUCOSE: 98 mg/dL (ref 65–99)
POTASSIUM: 2.4 mmol/L — AB (ref 3.5–5.1)
Sodium: 139 mmol/L (ref 135–145)

## 2015-10-09 LAB — TROPONIN I: Troponin I: <0.03 ng/mL (ref <0.031)

## 2015-10-09 LAB — MAGNESIUM: Magnesium: 2.1 mg/dL (ref 1.7–2.4)

## 2015-10-09 MED ORDER — FAMOTIDINE 20 MG PO TABS
20.0000 mg | ORAL_TABLET | Freq: Two times a day (BID) | ORAL | Status: DC
  Filled 2015-10-09: qty 1

## 2015-10-09 MED ORDER — SODIUM CHLORIDE 0.9% FLUSH: 3.0000 mL | Freq: Two times a day (BID) | INTRAVENOUS | Status: DC

## 2015-10-09 MED ORDER — NITROGLYCERIN 0.4 MG SL SUBL
0.4000 mg | SUBLINGUAL_TABLET | Freq: Once | SUBLINGUAL | Status: AC
  Administered 2015-10-09: 0.4 mg via SUBLINGUAL

## 2015-10-09 MED ORDER — LISINOPRIL 10 MG PO TABS: 10.0000 mg | ORAL_TABLET | Freq: Every day | ORAL | Status: DC

## 2015-10-09 MED ORDER — POTASSIUM CHLORIDE 20 MEQ PO PACK
20.0000 meq | PACK | Freq: Two times a day (BID) | ORAL | Status: DC
  Administered 2015-10-10: 20 meq via ORAL
  Filled 2015-10-09 (×2): qty 1

## 2015-10-09 MED ORDER — ASPIRIN 325 MG PO TABS
325.0000 mg | ORAL_TABLET | Freq: Every day | ORAL | Status: DC
  Administered 2015-10-09: 325 mg via ORAL

## 2015-10-09 MED ORDER — CLONAZEPAM 0.5 MG PO TABS
0.5000 mg | ORAL_TABLET | Freq: Two times a day (BID) | ORAL | Status: DC | PRN
  Administered 2015-10-09: 0.5 mg via ORAL
  Filled 2015-10-09: qty 1

## 2015-10-09 MED ORDER — OMEGA-3-ACID ETHYL ESTERS 1 G PO CAPS
2.0000 g | ORAL_CAPSULE | Freq: Every day | ORAL | Status: DC
  Administered 2015-10-10: 2 g via ORAL
  Filled 2015-10-09: qty 2

## 2015-10-09 MED ORDER — OMEGA-3-ACID ETHYL ESTERS 1 G PO CAPS: 1.0000 g | ORAL_CAPSULE | Freq: Every day | ORAL | Status: DC

## 2015-10-09 MED ORDER — FLUTICASONE PROPIONATE 50 MCG/ACT NA SUSP
1.0000 | Freq: Every day | NASAL | Status: DC | PRN
  Filled 2015-10-09: qty 16

## 2015-10-09 MED ORDER — POTASSIUM CHLORIDE 10 MEQ/100ML IV SOLN
10.0000 meq | INTRAVENOUS | Status: DC
  Administered 2015-10-09 (×2): 10 meq via INTRAVENOUS
  Filled 2015-10-09 (×2): qty 100

## 2015-10-09 MED ORDER — POTASSIUM CHLORIDE ER 10 MEQ PO TBCR
10.0000 meq | EXTENDED_RELEASE_TABLET | Freq: Every day | ORAL | Status: DC
  Filled 2015-10-09: qty 1

## 2015-10-09 MED ORDER — POTASSIUM CHLORIDE CRYS ER 20 MEQ PO TBCR
40.0000 meq | EXTENDED_RELEASE_TABLET | Freq: Once | ORAL | Status: AC
  Administered 2015-10-09: 40 meq via ORAL
  Filled 2015-10-09: qty 2

## 2015-10-09 MED ORDER — ROSUVASTATIN CALCIUM 10 MG PO TABS
5.0000 mg | ORAL_TABLET | Freq: Every day | ORAL | Status: DC
  Administered 2015-10-10: 5 mg via ORAL
  Filled 2015-10-09: qty 1

## 2015-10-09 MED ORDER — VITAMIN D3 25 MCG (1000 UNIT) PO TABS
1000.0000 [IU] | ORAL_TABLET | ORAL | Status: DC
Start: 2015-10-10 — End: 2015-10-10
  Administered 2015-10-10: 1000 [IU] via ORAL
  Filled 2015-10-09: qty 1

## 2015-10-09 MED ORDER — POTASSIUM CHLORIDE IN NACL 40-0.9 MEQ/L-% IV SOLN
INTRAVENOUS | Status: AC
  Administered 2015-10-09: 100 mL/h via INTRAVENOUS
  Filled 2015-10-09: qty 1000

## 2015-10-09 MED ORDER — ASPIRIN 325 MG PO TABS
325.0000 mg | ORAL_TABLET | Freq: Every day | ORAL | Status: DC
  Administered 2015-10-10: 325 mg via ORAL
  Filled 2015-10-09: qty 1

## 2015-10-09 MED ORDER — ENOXAPARIN SODIUM 40 MG/0.4ML ~~LOC~~ SOLN: 40.0000 mg | SUBCUTANEOUS | Status: DC

## 2015-10-09 MED ORDER — OMEGA-3 FATTY ACIDS 1000 MG PO CAPS: 2.0000 g | ORAL_CAPSULE | Freq: Every day | ORAL | Status: DC

## 2015-10-09 MED ORDER — ONDANSETRON HCL 4 MG/2ML IJ SOLN: 4.0000 mg | Freq: Four times a day (QID) | INTRAMUSCULAR | Status: DC | PRN

## 2015-10-09 MED ORDER — PANTOPRAZOLE SODIUM 40 MG PO TBEC
40.0000 mg | DELAYED_RELEASE_TABLET | Freq: Every day | ORAL | Status: DC
  Administered 2015-10-10: 40 mg via ORAL
  Filled 2015-10-09: qty 1

## 2015-10-09 MED ORDER — POTASSIUM CHLORIDE 10 MEQ/100ML IV SOLN
10.0000 meq | INTRAVENOUS | Status: AC
  Administered 2015-10-09 – 2015-10-10 (×2): 10 meq via INTRAVENOUS
  Filled 2015-10-09 (×2): qty 100

## 2015-10-09 MED ORDER — NITROGLYCERIN 2 % TD OINT
1.0000 [in_us] | TOPICAL_OINTMENT | Freq: Once | TRANSDERMAL | Status: AC
  Administered 2015-10-09: 1 [in_us] via TOPICAL
  Filled 2015-10-09: qty 1

## 2015-10-09 MED ORDER — ACETAMINOPHEN 325 MG PO TABS: 650.0000 mg | ORAL_TABLET | ORAL | Status: DC | PRN

## 2015-10-09 MED ORDER — CIPROFLOXACIN HCL 500 MG PO TABS
500.0000 mg | ORAL_TABLET | Freq: Two times a day (BID) | ORAL | Status: DC
  Administered 2015-10-10: 500 mg via ORAL
  Filled 2015-10-09: qty 1

## 2015-10-09 NOTE — ED Notes (Signed)
Dr.Ress made aware of critical K+ level

## 2015-10-09 NOTE — Progress Notes (Signed)
BP 226/84 mmHg  Pulse 80  Temp(Src) 98.4 F (36.9 C) (Oral)  Ht 5\' 2"  (1.575 m)  SpO2 98%   Subjective:    Patient ID: Rachel Walton, female    DOB: [birth year], 82 y.o.   MRN: [id number]  HPI: Rachel Walton is a 82 y.o. female presenting on 10/09/2015 for Chest Pain   HPI Chest tightness Patient presents with chest tightness and pain that goes into her left arm as well that started about 30-40 minutes ago. She is also been having increased breathing and her blood pressure is up here in the office at 226/84. She is also been having some perioral tingling and numbness. She denies any fevers or chills but does have general weakness and malaise. This all started after she was carrying a vacuum in the car and then they were going to drive to go out and eat but came here instead because she started having these pains. She denies any shortness of breath or wheezing. The chest pain is described as a tightness or squeezing pressure on the left aspect of her chest. She denies any focal weakness  Relevant past medical, surgical, family and social history reviewed and updated as indicated. Interim medical history since our last visit reviewed. Allergies and medications reviewed and updated.  Review of Systems  Constitutional: Positive for fatigue. Negative for fever and chills.  HENT: Negative for congestion, ear discharge and ear pain.   Eyes: Negative for redness and visual disturbance.  Respiratory: Positive for chest tightness. Negative for shortness of breath.   Cardiovascular: Positive for chest pain. Negative for leg swelling.  Genitourinary: Negative for dysuria and difficulty urinating.  Musculoskeletal: Negative for back pain and gait problem.  Skin: Negative for rash.  Neurological: Positive for weakness (Gen.), numbness (Perioral) and headaches. Negative for light-headedness.  Psychiatric/Behavioral: Negative for behavioral problems and agitation.  All other systems reviewed and  are negative.   Per HPI unless specifically indicated above     Medication List       This list is accurate as of: 10/09/15  4:55 PM.  Always use your most recent med list.               ciprofloxacin 500 MG tablet  Commonly known as:  CIPRO  Take 1 tablet (500 mg total) by mouth 2 (two) times daily.     clonazePAM 0.5 MG tablet  Commonly known as:  KLONOPIN  Take 1 tablet (0.5 mg total) by mouth 2 (two) times daily as needed (stress).     fish oil-omega-3 fatty acids 1000 MG capsule  Take 2 g by mouth daily.     fluticasone 50 MCG/ACT nasal spray  Commonly known as:  FLONASE  Place 1 spray into both nostrils daily as needed for rhinitis.     ICAPS AREDS FORMULA PO  Take 1 tablet by mouth 2 (two) times daily before a meal.     lisinopril 10 MG tablet  Commonly known as:  PRINIVIL,ZESTRIL  TAKE ONE (1) TABLET EACH DAY     potassium chloride 10 MEQ tablet  Commonly known as:  K-DUR  Take 1 tablet (10 mEq total) by mouth daily.     rosuvastatin 5 MG tablet  Commonly known as:  CRESTOR  Take 1 tablet (5 mg total) by mouth at bedtime.     Vitamin D3 2000 units Tabs  Take 1 tablet by mouth 2 (two) times a week. As needed  Objective:    BP 226/84 mmHg  Pulse 80  Temp(Src) 98.4 F (36.9 C) (Oral)  Ht 5\' 2"  (1.575 m)  SpO2 98%  Wt Readings from Last 3 Encounters:  09/30/15 115 lb 9.6 oz (52.436 kg)  09/07/15 111 lb (50.349 kg)  08/19/15 117 lb (53.071 kg)    Physical Exam  Constitutional: She is oriented to person, place, and time. She appears well-developed and well-nourished. No distress.  Eyes: Conjunctivae and EOM are normal. Pupils are equal, round, and reactive to light.  Cardiovascular: Regular rhythm, S1 normal, S2 normal, normal heart sounds, intact distal pulses and normal pulses.  Tachycardia present.   No murmur heard. Pulmonary/Chest: Effort normal and breath sounds normal. No respiratory distress. She has no wheezes. She has no  rales.  Abdominal: Soft. Bowel sounds are normal. She exhibits no distension. There is no tenderness.  Musculoskeletal: Normal range of motion. She exhibits no edema or tenderness.  Neurological: She is alert and oriented to person, place, and time. Coordination normal.  Skin: Skin is warm and dry. No rash noted. She is not diaphoretic.  Psychiatric: She has a normal mood and affect. Her behavior is normal.  Nursing note and vitals reviewed.  EKG: Nonspecific ST changes. ST changes and V2 to V6 that are elevated but less than 1 mm    Assessment & Plan:   Problem List Items Addressed This Visit    None    Visit Diagnoses    Chest tightness    -  Primary    Relevant Medications    aspirin tablet 325 mg    nitroGLYCERIN (NITROSTAT) SL tablet 0.4 mg    Other Relevant Orders    EKG 12-Lead (Completed)    Hypertensive urgency        Relevant Medications    aspirin tablet 325 mg    nitroGLYCERIN (NITROSTAT) SL tablet 0.4 mg       Follow up plan: Return if symptoms worsen or fail to improve.  Counseling provided for all of the vaccine components Orders Placed This Encounter  Procedures  . EKG 12-Lead    Caryl Pina, MD [facility] 10/09/2015, 4:55 PM

## 2015-10-09 NOTE — H&P (Signed)
History and Physical    Rachel Walton [id number] DOB: 02/08/1930 DOA: 10/09/2015  PCP: Redge Gainer, MD   Patient coming from: PCPs office.  Chief Complaint: Chest pain.  HPI: Rachel Walton is a 82 y.o. female with medical history significant of tremor, colonic polyps, diverticulosis, osteopenia, kyphosis, recurrent UTIs, hyperlipidemia, IBS, vitamin D deficiency, PUD, esophageal stricture, fatty liver, hiatal hernia, Barrett's esophagus, anxiety who comes to the ER referred by her PCP due to CP.  Per patient, around 1500 earlier today, she was caring a vacuum cleaner from her vehicle to her residence when she developed pain under her left breast, left upper arm pain and tingling sensation of the hand, particularly of the fingers. The patient states that her pain persisted. Associated symptoms were mild dyspnea and palpitations. She denies dizziness, nausea, emesis, diaphoresis, recent pitting edema of the lower extremities, PND or orthopnea. States that she asked her spouse about cardiac chest pain, since she states that he has a previous history of an MI, then decided to go see her doctor for further evaluation. In the doctor's office she was noticed to have blood pressure measurements in the 220s over 80s. She was referred to the emergency department by her PCP for further evaluation via EMS. She received regular aspirin and sublingual nitroglycerin during transport. She is states that her pain resolved after being given the nitroglycerin.  Patient also complains of dysuria, chronic diarrhea and dysphagia. She states that she is being followed by gastroenterology.  ED Course: Workup in the ER significant for hypokalemia and abnormal EKG with borderline diffuse ST depression.  Review of Systems: As per HPI otherwise 10 point review of systems negative.    Past Medical History  Diagnosis Date  . Tremor   . Postmenopausal   . Hx of adenomatous colonic polyps   . Kyphosis   .  Recurrent UTI   . Atrophic vaginitis   . Diverticulosis   . 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 Cataract   . Hyperlipidemia   . Diverticulosis   . Osteopenia of the elderly   . Macular degeneration   . Breast cyst   . IBS (irritable bowel syndrome)   . Vitamin D deficiency   . Acute gastric ulcer   . Esophageal stricture   . Fatty liver   . Hiatal hernia   . Allergy   . Anxiety   . 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 Barrett's esophagus     Past Surgical History  Procedure Laterality Date  . Back surgery    . Appendectomy    . Cataract surgery    . Eye surgery    . Breast surgery      cyst removal  . Abdominal hysterectomy  1980  . Colonoscopy    . Polypectomy       reports that she has never smoked. She has never used smokeless tobacco. She reports that she does not drink alcohol or use illicit drugs.  Allergies  Allergen Reactions  . Alendronate Sodium Other (See Comments)    Caused chest pain  . Amoxicillin     875 mg only . Can take when lesser strength  . Cephalexin   . Prednisone Other (See Comments)    Reaction is unknown..then states nervousness  . Sudafed [Pseudoephedrine Hcl] Other (See Comments)    insomnia  . Sulfa Antibiotics Hives  . Toprol Xl [Metoprolol Succinate] Other (See  Comments)    Unknown reaction  . Trimethoprim     Unknown reaction    Family History  Problem Relation Age of Onset  . Colon cancer      nephew  . Stroke Mother   . Heart disease Father     MI  . Cancer Sister     Brain tumor  . Hypertension Sister   . Alcohol abuse Sister   . Colon cancer Brother   . Heart disease Brother   . Diabetes Son   . Diabetes Maternal Uncle   . Diabetes Son   . Hypertension Sister   . Breast cancer Sister   . Hypertension Sister   . Hypertension Sister   . Hypertension Sister   . Stroke Sister   . Hypertension Sister   . Hypertension Sister   . Brain cancer Son     deceased  .  Colon polyps Neg Hx   . Esophageal cancer Neg Hx   . Rectal cancer Neg Hx   . Stomach cancer Neg Hx   . Thyroid cancer Other     niece  . Breast cancer Other     nieces x 3      Prior to Admission medications   Medication Sig Start Date End Date Taking? Authorizing Provider  Cholecalciferol (VITAMIN D3) 2000 UNITS TABS Take 1 tablet by mouth 2 (two) times a week. As needed    Historical Provider, MD  ciprofloxacin (CIPRO) 500 MG tablet Take 1 tablet (500 mg total) by mouth 2 (two) times daily. 10/03/15   Chipper Herb, MD  clonazePAM (KLONOPIN) 0.5 MG tablet Take 1 tablet (0.5 mg total) by mouth 2 (two) times daily as needed (stress). 06/05/15   Chipper Herb, MD  fish oil-omega-3 fatty acids 1000 MG capsule Take 2 g by mouth daily.    Historical Provider, MD  fluticasone (FLONASE) 50 MCG/ACT nasal spray Place 1 spray into both nostrils daily as needed for rhinitis.  04/10/14   Historical Provider, MD  lisinopril (PRINIVIL,ZESTRIL) 10 MG tablet TAKE ONE (1) TABLET EACH DAY 07/17/15   Chipper Herb, MD  Multiple Vitamins-Minerals (ICAPS AREDS FORMULA PO) Take 1 tablet by mouth 2 (two) times daily before a meal.     Historical Provider, MD  potassium chloride (K-DUR) 10 MEQ tablet Take 1 tablet (10 mEq total) by mouth daily. 10/03/15   Chipper Herb, MD  rosuvastatin (CRESTOR) 5 MG tablet Take 1 tablet (5 mg total) by mouth at bedtime. Patient taking differently: Take 5 mg by mouth at bedtime. 1/2 tablet 12/21/14   Chipper Herb, MD    Physical Exam: Filed Vitals:   10/09/15 1930 10/09/15 1945 10/09/15 2000 10/09/15 2015  BP: 190/56 186/57 167/55 154/53  Pulse: 61 65 60 61  Temp:      TempSrc:      Resp: 12 15 17 16   Weight:      SpO2: 97% 96% 96% 99%      Constitutional: NAD, calm, comfortable Filed Vitals:   10/09/15 1930 10/09/15 1945 10/09/15 2000 10/09/15 2015  BP: 190/56 186/57 167/55 154/53  Pulse: 61 65 60 61  Temp:      TempSrc:      Resp: 12 15 17 16   Weight:        SpO2: 97% 96% 96% 99%   Eyes: PERRL, lids and conjunctivae normal ENMT: Mucous membranes are moist. Posterior pharynx clear of any exudate or lesions. Normal dentition.  Neck: normal, supple, no  masses, no thyromegaly Respiratory: clear to auscultation bilaterally, no wheezing, no crackles. Normal respiratory effort. No accessory muscle use.  Cardiovascular: Regular rate and rhythm, no murmurs / rubs / gallops. Trace lower extremity pitting edema.No carotid bruits.  Abdomen: no tenderness, no masses palpated. No hepatosplenomegaly. Bowel sounds positive.  Musculoskeletal: no clubbing / cyanosis. No joint deformity upper and lower extremities. Good ROM, no contractures. Normal muscle tone.  Skin: no rashes, lesions, ulcers. No induration Neurologic: CN 2-12 grossly intact. Sensation intact, DTR normal. Strength 5/5 in all 4.  Psychiatric: Normal judgment and insight. Alert and oriented x 4. Normal mood.    Labs on Admission: I have personally reviewed following labs and imaging studies  CBC:  Recent Labs Lab 10/09/15 1915  WBC 4.6  NEUTROABS 2.1  HGB 11.8*  HCT 36.1  MCV 86.6  PLT 99991111   Basic Metabolic Panel:  Recent Labs Lab 10/09/15 1915  NA 139  K 2.4*  CL 105  CO2 27  GLUCOSE 98  BUN 6  CREATININE 0.74  CALCIUM 8.6*  MG 2.1   GFR: Estimated Creatinine Clearance: 42.1 mL/min (by C-G formula based on Cr of 0.74).  Cardiac Enzymes:  Recent Labs Lab 10/09/15 1915  TROPONINI <0.03   Urine analysis:    Component Value Date/Time   COLORURINE RED* 09/07/2015 0854   APPEARANCEUR Clear 10/03/2015 [place] 09/07/2015 0854   LABSPEC <1.005* 09/07/2015 0854   PHURINE 7.5 09/07/2015 0854   GLUCOSEU Negative 10/03/2015 1206   HGBUR LARGE* 09/07/2015 0854   BILIRUBINUR Negative 10/03/2015 [place] 09/07/2015 0854   BILIRUBINUR neg 01/25/2015 [place] 09/07/2015 0854   PROTEINUR Negative 10/03/2015 1206    PROTEINUR 100* 09/07/2015 0854   PROTEINUR neg 01/25/2015 1608   UROBILINOGEN negative 01/25/2015 1608   NITRITE Negative 10/03/2015 1206   NITRITE NEGATIVE 09/07/2015 0854   NITRITE neg 01/25/2015 1608   LEUKOCYTESUR 2+* 10/03/2015 1206   LEUKOCYTESUR MODERATE* 09/07/2015 0854    Recent Results (from the past 240 hour(s))  Microscopic Examination     Status: Abnormal   Collection Time: 09/30/15  3:55 PM  Result Value Ref Range Status   WBC, UA >30 (A) 0 -  5 /hpf Final   RBC, UA 0-2 0 -  2 /hpf Final   Epithelial Cells (non renal) 0-10 0 - 10 /hpf Final   Renal Epithel, UA 0-10 (A) None seen /hpf Final   Bacteria, UA Few None seen/Few Final  Fecal occult blood, imunochemical     Status: None   Collection Time: 10/02/15 11:51 AM  Result Value Ref Range Status   Fecal Occult Bld Negative Negative Final  Urine culture     Status: None   Collection Time: 10/03/15 12:05 PM  Result Value Ref Range Status   Urine Culture, Routine Final report  Final   Urine Culture result 1 Comment  Final    Comment: Mixed urogenital flora 25,000-50,000 colony forming units per mL   Microscopic Examination     Status: Abnormal   Collection Time: 10/03/15 12:06 PM  Result Value Ref Range Status   WBC, UA >30 (A) 0 -  5 /hpf Final   RBC, UA 0-2 0 -  2 /hpf Final   Epithelial Cells (non renal) 0-10 0 - 10 /hpf Final   Bacteria, UA Moderate (A) None seen/Few Final     Radiological Exams on Admission: Dg Chest 2 View  10/09/2015  CLINICAL DATA:  Chest pain EXAM: CHEST  2 VIEW COMPARISON:  07/10/2013 chest radiograph. FINDINGS: Stable cardiomediastinal silhouette with normal heart size. No pneumothorax. No pleural effusion. Lungs appear clear, with no acute consolidative airspace disease and no pulmonary edema. Stable small sclerotic focus in the right proximal humerus, unchanged back to at least 05/01/2013, suggesting a benign lesion such as a small bone infarct. IMPRESSION: No active cardiopulmonary  disease. Electronically Signed   By: Ilona Sorrel M.D.   On: 10/09/2015 19:06    EKG: Independently reviewed. Vent. rate 66 BPM PR interval 125 ms QRS duration 83 ms QT/QTc 470/492 ms P-R-T axes 92 60 57 Sinus rhythm Borderline ST depression, diffuse leads Borderline prolonged QT interval  Assessment/Plan Principal Problem:   Chest pain Admit to telemetry/observation. Supplemental oxygen as needed. Continue aspirin. Continue sublingual nitroglycerin as needed. Trend troponin levels. Check echocardiogram in a.m.    Hypokalemia Continue potassium replacement. Check magnesium level. Follow-up potassium level.  Active Problems:   Recurrent UTI Continue ciprofloxacin 500 mg by mouth twice a day. Follow-up urine culture and sensitivity.    Hyperlipemia Continue Crestor. Monitor LFTs periodically.    Generalized anxiety disorder Continue clonazepam as needed.    Essential hypertension Lisinopril 10 mg by mouth daily. Monitor blood pressure and renal function.    Anemia Check anemia panel. Follow hematocrit and hemoglobin level.   DVT prophylaxis: Lovenox SQ. Code Status: Full code. Family Communication:  Disposition Plan: Admit to telemetry for troponin level trending. Consults called: None. Admission status: Observation/Telemetry.   Reubin Milan MD [facility] Pager [phone].  If 7PM-7AM, please contact night-coverage [email or web address] Password [email or web address]  10/09/2015, 9:01 PM

## 2015-10-09 NOTE — ED Provider Notes (Signed)
CSN: [id number]     Arrival date & time 10/09/15  1800 History   First MD Initiated Contact with Patient 10/09/15 1818     Chief Complaint  Patient presents with  . Chest Pain  . Hypertension      Patient is a 82 y.o. female presenting with chest pain and hypertension. The history is provided by the patient, the EMS personnel and medical records. No language interpreter was used.  Chest Pain Hypertension Associated symptoms include chest pain.   Rachel Walton is a 82 y.o. female who presents to the Emergency Department complaining of chest pain.  She presents via EMS from her primary care provider's office for evaluation of chest and arm pain. She states she was moving a vacuum from the car to her house when she developed pain in her left upper arm and tingling in her fingers. Her pain persisted and she presented to her primary care provider's office who referred her to the emergency department for further evaluation. In her primary care provider's office she was noted to be hypertensive with blood pressures 220s over 80s. She denies any nausea, diaphoresis, abdominal pain, shortness of breath, leg swelling or pain. She denies any similar previous symptoms. She received 320 formula grams of aspirin and 1 sublingual nitroglycerin prior to ED arrival. She states her chest/arm pain completely resolved after one nitroglycerin. She continues to be pain-free in the emergency department. Her pain began around 3 PM today.  Past Medical History  Diagnosis Date  . Tremor   . Postmenopausal   . Hx of adenomatous colonic polyps   . Kyphosis   . Recurrent UTI   . Atrophic vaginitis   . Diverticulosis   . 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 Hyperlipidemia   . Diverticulosis   . Osteopenia of the elderly   . Macular degeneration     "?wet or dry; ? both eyes"  . IBS (irritable bowel syndrome)   . Vitamin D deficiency   . Acute gastric ulcer   . Esophageal stricture   . Fatty  liver   . Hiatal hernia   . Allergy   . Anxiety   . Barrett's esophagus   . 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 Basal cell carcinoma of nose      under nose  . Hypertension   . Heart murmur   . Pneumonia X 1  . GERD (gastroesophageal reflux disease)   . Esophageal ulcer   . Depression    Past Surgical History  Procedure Laterality Date  . Back surgery    . Appendectomy    . Cataract extraction w/ intraocular lens  implant, bilateral Bilateral   . Eye surgery    . Colonoscopy    . Polypectomy    . Breast cyst excision Left   . Lumbar disc surgery      "fragmented disc"  . Squamous cell carcinoma excision      nose tip  . Abdominal hysterectomy  1980  . Esophagogastroduodenoscopy (egd) with esophageal dilation  X 1   Family History  Problem Relation Age of Onset  . Colon cancer      nephew  . Stroke Mother   . Heart disease Father     MI  . Cancer Sister     Brain tumor  . Hypertension Sister   . Alcohol abuse Sister   . Colon cancer Brother   . Heart disease Brother   . Diabetes  Son   . Diabetes Maternal Uncle   . Diabetes Son   . Hypertension Sister   . Breast cancer Sister   . Hypertension Sister   . Hypertension Sister   . Hypertension Sister   . Stroke Sister   . Hypertension Sister   . Hypertension Sister   . Brain cancer Son     deceased  . Colon polyps Neg Hx   . Esophageal cancer Neg Hx   . Rectal cancer Neg Hx   . Stomach cancer Neg Hx   . Thyroid cancer Other     niece  . Breast cancer Other     nieces x 3    Social History  Substance Use Topics  . Smoking status: Never Smoker   . Smokeless tobacco: Never Used  . Alcohol Use: No   OB History    No data available     Review of Systems  Cardiovascular: Positive for chest pain.  Gastrointestinal: Positive for diarrhea.  All other systems reviewed and are negative.     Allergies  Alendronate sodium; Amoxicillin; Cephalexin; Prednisone; Sudafed; Sulfa antibiotics; Toprol  xl; and Trimethoprim  Home Medications   Prior to Admission medications   Medication Sig Start Date End Date Taking? Authorizing Provider  Cholecalciferol (VITAMIN D3) 2000 UNITS TABS Take 1 tablet by mouth 2 (two) times a week. As needed    Historical Provider, MD  ciprofloxacin (CIPRO) 500 MG tablet Take 1 tablet (500 mg total) by mouth 2 (two) times daily. 10/03/15   Chipper Herb, MD  clonazePAM (KLONOPIN) 0.5 MG tablet Take 1 tablet (0.5 mg total) by mouth 2 (two) times daily as needed (stress). 06/05/15   Chipper Herb, MD  fish oil-omega-3 fatty acids 1000 MG capsule Take 2 g by mouth daily.    Historical Provider, MD  fluticasone (FLONASE) 50 MCG/ACT nasal spray Place 1 spray into both nostrils daily as needed for rhinitis.  04/10/14   Historical Provider, MD  lisinopril (PRINIVIL,ZESTRIL) 10 MG tablet TAKE ONE (1) TABLET EACH DAY 07/17/15   Chipper Herb, MD  Multiple Vitamins-Minerals (ICAPS AREDS FORMULA PO) Take 1 tablet by mouth 2 (two) times daily before a meal.     Historical Provider, MD  potassium chloride (K-DUR) 10 MEQ tablet Take 1 tablet (10 mEq total) by mouth daily. 10/03/15   Chipper Herb, MD  rosuvastatin (CRESTOR) 5 MG tablet Take 1 tablet (5 mg total) by mouth at bedtime. Patient taking differently: Take 5 mg by mouth at bedtime. 1/2 tablet 12/21/14   Chipper Herb, MD   BP 185/62 mmHg  Pulse 67  Temp(Src) 97.9 F (36.6 C) (Oral)  Resp 18  Ht 5\' 2"  (1.575 m)  Wt 114 lb 4.8 oz (51.846 kg)  BMI 20.90 kg/m2  SpO2 100% Physical Exam  Constitutional: She is oriented to person, place, and time. She appears well-developed and well-nourished.  HENT:  Head: Normocephalic and atraumatic.  Cardiovascular: Normal rate and regular rhythm.   No murmur heard. Pulmonary/Chest: Effort normal and breath sounds normal. No respiratory distress.  Abdominal: Soft. There is no tenderness. There is no rebound and no guarding.  Musculoskeletal: She exhibits no tenderness.  Slight  nonpitting edema bilateral lower extremities  Neurological: She is alert and oriented to person, place, and time.  Skin: Skin is warm and dry.  Psychiatric: She has a normal mood and affect. Her behavior is normal.  Nursing note and vitals reviewed.   ED Course  Procedures (including  critical care time) Labs Review Labs Reviewed  BASIC METABOLIC PANEL - Abnormal; Notable for the following:    Potassium 2.4 (*)    Calcium 8.6 (*)    All other components within normal limits  CBC WITH DIFFERENTIAL/PLATELET - Abnormal; Notable for the following:    Hemoglobin 11.8 (*)    All other components within normal limits  GASTROINTESTINAL PANEL BY PCR, STOOL (REPLACES STOOL CULTURE)  TROPONIN I  MAGNESIUM  TROPONIN I  TROPONIN I  BASIC METABOLIC PANEL  VITAMIN 123456  FOLATE  IRON AND TIBC  FERRITIN  RETICULOCYTES    Imaging Review Dg Chest 2 View  10/09/2015  CLINICAL DATA:  Chest pain EXAM: CHEST  2 VIEW COMPARISON:  07/10/2013 chest radiograph. FINDINGS: Stable cardiomediastinal silhouette with normal heart size. No pneumothorax. No pleural effusion. Lungs appear clear, with no acute consolidative airspace disease and no pulmonary edema. Stable small sclerotic focus in the right proximal humerus, unchanged back to at least 05/01/2013, suggesting a benign lesion such as a small bone infarct. IMPRESSION: No active cardiopulmonary disease. Electronically Signed   By: Ilona Sorrel M.D.   On: 10/09/2015 19:06   I have personally reviewed and evaluated these images and lab results as part of my medical decision-making.   EKG Interpretation   Date/Time:  Wednesday Oct 09 2015 18:19:39 EDT Ventricular Rate:  66 PR Interval:  125 QRS Duration: 83 QT Interval:  470 QTC Calculation: 492 R Axis:   60 Text Interpretation:  Sinus rhythm Borderline ST depression, diffuse leads  Borderline prolonged QT interval Confirmed by Hazle Coca [phone]) on  10/09/2015 6:23:27 PM      MDM   Final  diagnoses:  Hypokalemia  Chest pain, unspecified chest pain type    Patient here for evaluation of pain in her left arm and tingling in her fingers. She is hypertensive at her primary care provider's office. In the emergency department her symptoms have resolved. BMP demonstrates hypokalemia, question if this is related to her chronic diarrhea. Will replace with IV and oral potassium. Her pain did resolve prior to ED arrival, nitroglycerin paste was placed. Hospitalist consultation for observation given her hypertension, hypokalemia, chest/arm pain. Presentation is not consistent with dissection, PE, CHF.  Quintella Reichert, MD 10/10/15 [phone]

## 2015-10-09 NOTE — ED Notes (Signed)
Pt arrives EMS after seen at [facility] office for having Chest pain today after lifting vacume cleaner. Given Aspirin 324 at [facility] office.Nitro x 1 . 18 g at r AC.

## 2015-10-10 ENCOUNTER — Encounter (HOSPITAL_COMMUNITY): Payer: Self-pay | Admitting: General Practice

## 2015-10-10 ENCOUNTER — Observation Stay (HOSPITAL_COMMUNITY): Payer: Medicare Other

## 2015-10-10 ENCOUNTER — Observation Stay (HOSPITAL_BASED_OUTPATIENT_CLINIC_OR_DEPARTMENT_OTHER): Payer: Medicare Other

## 2015-10-10 DIAGNOSIS — E876 Hypokalemia: Secondary | ICD-10-CM

## 2015-10-10 DIAGNOSIS — E785 Hyperlipidemia, unspecified: Secondary | ICD-10-CM

## 2015-10-10 DIAGNOSIS — I10 Essential (primary) hypertension: Secondary | ICD-10-CM | POA: Diagnosis not present

## 2015-10-10 DIAGNOSIS — R079 Chest pain, unspecified: Secondary | ICD-10-CM | POA: Insufficient documentation

## 2015-10-10 LAB — TROPONIN I

## 2015-10-10 LAB — NM MYOCAR MULTI W/SPECT W/WALL MOTION / EF
Peak HR: 104 {beats}/min
Rest HR: 67 {beats}/min

## 2015-10-10 LAB — GASTROINTESTINAL PANEL BY PCR, STOOL (REPLACES STOOL CULTURE)
Adenovirus F40/41: NOT DETECTED
Astrovirus: NOT DETECTED
CRYPTOSPORIDIUM: NOT DETECTED
Campylobacter species: NOT DETECTED
Cyclospora cayetanensis: NOT DETECTED
E. coli O157: NOT DETECTED
ENTAMOEBA HISTOLYTICA: NOT DETECTED
ENTEROAGGREGATIVE E COLI (EAEC): NOT DETECTED
ENTEROPATHOGENIC E COLI (EPEC): NOT DETECTED
Enterotoxigenic E coli (ETEC): NOT DETECTED
GIARDIA LAMBLIA: NOT DETECTED
Norovirus GI/GII: NOT DETECTED
Plesimonas shigelloides: NOT DETECTED
Rotavirus A: NOT DETECTED
SALMONELLA SPECIES: NOT DETECTED
SHIGELLA/ENTEROINVASIVE E COLI (EIEC): NOT DETECTED
Sapovirus (I, II, IV, and V): NOT DETECTED
Shiga like toxin producing E coli (STEC): NOT DETECTED
VIBRIO CHOLERAE: NOT DETECTED
Vibrio species: NOT DETECTED
YERSINIA ENTEROCOLITICA: NOT DETECTED

## 2015-10-10 LAB — BASIC METABOLIC PANEL
ANION GAP: 4 — AB (ref 5–15)
CALCIUM: 8.1 mg/dL — AB (ref 8.9–10.3)
CO2: 25 mmol/L (ref 22–32)
Chloride: 113 mmol/L — ABNORMAL HIGH (ref 101–111)
Creatinine, Ser: 0.72 mg/dL (ref 0.44–1.00)
GFR calc Af Amer: >60 mL/min (ref >60)
GLUCOSE: 91 mg/dL (ref 65–99)
Potassium: 3.9 mmol/L (ref 3.5–5.1)
Sodium: 142 mmol/L (ref 135–145)

## 2015-10-10 LAB — FERRITIN: Ferritin: 26 ng/mL (ref 11–307)

## 2015-10-10 LAB — FOLATE: FOLATE: 22.8 ng/mL (ref >5.9)

## 2015-10-10 LAB — ECHOCARDIOGRAM COMPLETE
Height: 62 in
Weight: 1828.8 oz

## 2015-10-10 LAB — RETICULOCYTES
RBC.: 4.11 MIL/uL (ref 3.87–5.11)
RETIC COUNT ABSOLUTE: 28.8 10*3/uL (ref 19.0–186.0)
RETIC CT PCT: 0.7 % (ref 0.4–3.1)

## 2015-10-10 LAB — IRON AND TIBC
Iron: 66 ug/dL (ref 28–170)
SATURATION RATIOS: 24 % (ref 10.4–31.8)
TIBC: 280 ug/dL (ref 250–450)
UIBC: 214 ug/dL

## 2015-10-10 LAB — VITAMIN B12: VITAMIN B 12: 218 pg/mL (ref 180–914)

## 2015-10-10 MED ORDER — LISINOPRIL 20 MG PO TABS
20.0000 mg | ORAL_TABLET | Freq: Every day | ORAL | Status: DC
  Administered 2015-10-10: 20 mg via ORAL
  Filled 2015-10-10: qty 1

## 2015-10-10 MED ORDER — ENSURE ENLIVE PO LIQD: 237.0000 mL | Freq: Two times a day (BID) | ORAL | Status: DC

## 2015-10-10 MED ORDER — TECHNETIUM TC 99M TETROFOSMIN IV KIT
30.0000 | PACK | Freq: Once | INTRAVENOUS | Status: AC | PRN
  Administered 2015-10-10: 30 via INTRAVENOUS

## 2015-10-10 MED ORDER — REGADENOSON 0.4 MG/5ML IV SOLN
0.4000 mg | Freq: Once | INTRAVENOUS | Status: AC
  Administered 2015-10-10: 0.4 mg via INTRAVENOUS
  Filled 2015-10-10: qty 5

## 2015-10-10 MED ORDER — TECHNETIUM TC 99M TETROFOSMIN IV KIT
10.0000 | PACK | Freq: Once | INTRAVENOUS | Status: AC | PRN
  Administered 2015-10-10: 10 via INTRAVENOUS

## 2015-10-10 MED ORDER — REGADENOSON 0.4 MG/5ML IV SOLN
INTRAVENOUS | Status: AC
  Administered 2015-10-10: 0.4 mg via INTRAVENOUS
  Filled 2015-10-10: qty 5

## 2015-10-10 MED ORDER — LISINOPRIL 10 MG PO TABS: 20.0000 mg | ORAL_TABLET | Freq: Every day | ORAL | Status: DC

## 2015-10-10 NOTE — Progress Notes (Signed)
Lexiscan nuc completed without complication, day 1/1, images pending. Tahari Clabaugh PA-C

## 2015-10-10 NOTE — Progress Notes (Signed)
  Echocardiogram 2D Echocardiogram has been performed.  Rachel Walton 10/10/2015, 3:47 PM

## 2015-10-10 NOTE — Discharge Instructions (Signed)
Nonspecific Chest Pain  °Chest pain can be caused by many different conditions. There is always a chance that your pain could be related to something serious, such as a heart attack or a blood clot in your lungs. Chest pain can also be caused by conditions that are not life-threatening. If you have chest pain, it is very important to follow up with your health care provider. °CAUSES  °Chest pain can be caused by: °· Heartburn. °· Pneumonia or bronchitis. °· Anxiety or stress. °· Inflammation around your heart (pericarditis) or lung (pleuritis or pleurisy). °· A blood clot in your lung. °· A collapsed lung (pneumothorax). It can develop suddenly on its own (spontaneous pneumothorax) or from trauma to the chest. °· Shingles infection (varicella-zoster virus). °· Heart attack. °· Damage to the bones, muscles, and cartilage that make up your chest wall. This can include: °¨ Bruised bones due to injury. °¨ Strained muscles or cartilage due to frequent or repeated coughing or overwork. °¨ Fracture to one or more ribs. °¨ Sore cartilage due to inflammation (costochondritis). °RISK FACTORS  °Risk factors for chest pain may include: °· Activities that increase your risk for trauma or injury to your chest. °· Respiratory infections or conditions that cause frequent coughing. °· Medical conditions or overeating that can cause heartburn. °· Heart disease or family history of heart disease. °· Conditions or health behaviors that increase your risk of developing a blood clot. °· Having had chicken pox (varicella zoster). °SIGNS AND SYMPTOMS °Chest pain can feel like: °· Burning or tingling on the surface of your chest or deep in your chest. °· Crushing, pressure, aching, or squeezing pain. °· Dull or sharp pain that is worse when you move, cough, or take a deep breath. °· Pain that is also felt in your back, neck, shoulder, or arm, or pain that spreads to any of these areas. °Your chest pain may come and go, or it may stay  constant. °DIAGNOSIS °Lab tests or other studies may be needed to find the cause of your pain. Your health care provider may have you take a test called an ambulatory ECG (electrocardiogram). An ECG records your heartbeat patterns at the time the test is performed. You may also have other tests, such as: °· Transthoracic echocardiogram (TTE). During echocardiography, sound waves are used to create a picture of all of the heart structures and to look at how blood flows through your heart. °· Transesophageal echocardiogram (TEE). This is a more advanced imaging test that obtains images from inside your body. It allows your health care provider to see your heart in finer detail. °· Cardiac monitoring. This allows your health care provider to monitor your heart rate and rhythm in real time. °· Holter monitor. This is a portable device that records your heartbeat and can help to diagnose abnormal heartbeats. It allows your health care provider to track your heart activity for several days, if needed. °· Stress tests. These can be done through exercise or by taking medicine that makes your heart beat more quickly. °· Blood tests. °· Imaging tests. °TREATMENT  °Your treatment depends on what is causing your chest pain. Treatment may include: °· Medicines. These may include: °¨ Acid blockers for heartburn. °¨ Anti-inflammatory medicine. °¨ Pain medicine for inflammatory conditions. °¨ Antibiotic medicine, if an infection is present. °¨ Medicines to dissolve blood clots. °¨ Medicines to treat coronary artery disease. °· Supportive care for conditions that do not require medicines. This may include: °¨ Resting. °¨ Applying heat   or cold packs to injured areas. °¨ Limiting activities until pain decreases. °HOME CARE INSTRUCTIONS °· If you were prescribed an antibiotic medicine, finish it all even if you start to feel better. °· Avoid any activities that bring on chest pain. °· Do not use any tobacco products, including  cigarettes, chewing tobacco, or electronic cigarettes. If you need help quitting, ask your health care provider. °· Do not drink alcohol. °· Take medicines only as directed by your health care provider. °· Keep all follow-up visits as directed by your health care provider. This is important. This includes any further testing if your chest pain does not go away. °· If heartburn is the cause for your chest pain, you may be told to keep your head raised (elevated) while sleeping. This reduces the chance that acid will go from your stomach into your esophagus. °· Make lifestyle changes as directed by your health care provider. These may include: °¨ Getting regular exercise. Ask your health care provider to suggest some activities that are safe for you. °¨ Eating a heart-healthy diet. A registered dietitian can help you to learn healthy eating options. °¨ Maintaining a healthy weight. °¨ Managing diabetes, if necessary. °¨ Reducing stress. °SEEK MEDICAL CARE IF: °· Your chest pain does not go away after treatment. °· You have a rash with blisters on your chest. °· You have a fever. °SEEK IMMEDIATE MEDICAL CARE IF:  °· Your chest pain is worse. °· You have an increasing cough, or you cough up blood. °· You have severe abdominal pain. °· You have severe weakness. °· You faint. °· You have chills. °· You have sudden, unexplained chest discomfort. °· You have sudden, unexplained discomfort in your arms, back, neck, or jaw. °· You have shortness of breath at any time. °· You suddenly start to sweat, or your skin gets clammy. °· You feel nauseous or you vomit. °· You suddenly feel light-headed or dizzy. °· Your heart begins to beat quickly, or it feels like it is skipping beats. °These symptoms may represent a serious problem that is an emergency. Do not wait to see if the symptoms will go away. Get medical help right away. Call your local emergency services (911 in the [place] Do not drive yourself to the hospital. °  °This  information is not intended to replace advice given to you by your health care provider. Make sure you discuss any questions you have with your health care provider. °  °Document Released: 02/04/2005 Document Revised: 05/18/2014 Document Reviewed: 12/01/2013 °Elsevier Interactive Patient Education ©2016 [employer] ° °

## 2015-10-10 NOTE — Care Management Note (Signed)
Case Management Note  Patient Details  Name: ROX TALMADGE MRN: [id number] Date of Birth: [birth year]  Subjective/Objective:  82 y.o. F admitted 10/09/2015 for CP. She was referred by her PCP. Tatayana c/o substernal CP while carrying Vac cleaner in her home where she llives with her spouse. No problems with transportation or obtaining medications. Pt has a hx of Chronic Diarrhea for which she is followed by [facility]. She is I in her home.                   Action/Plan: No CM needs at present. Will be available should CM needs arise.    Expected Discharge Date:                  Expected Discharge Plan:  Home/Self Care  In-House Referral:     Discharge planning Services  CM Consult  Post Acute Care Choice:    Choice offered to:     DME Arranged:    DME Agency:     HH Arranged:  [facility] HH Agency:     Status of Service:  Completed, signed off  Medicare Important Message Given:    Date Medicare IM Given:    Medicare IM give by:    Date Additional Medicare IM Given:    Additional Medicare Important Message give by:     If discussed at [place] of Stay Meetings, dates discussed:    Additional Comments:  Delrae Sawyers, RN 10/10/2015, 2:48 PM

## 2015-10-10 NOTE — Progress Notes (Signed)
Patient aware of echo results and Lexiscan Myoview results. Dr. Sallyanne Kuster communicated results with primary team.

## 2015-10-10 NOTE — Consult Note (Signed)
CARDIOLOGY CONSULT NOTE   Patient ID: Rachel Walton MRN: [id number] DOB/AGE: [birth year] 82 y.o.  Admit date: 10/09/2015  Requesting Physician: Dr. Algis Liming  Primary Physician:   Redge Gainer, MD Primary Cardiologist:  New Reason for Consultation:   Chest pain  HPI: Rachel Walton is a 82 y.o. female with a history of tremor, diverticulosis, recurrent UTIs, hyperlipidemia, IBS, vitamin D deficiency, PUD, esophageal stricture, fatty liver, Barrett's esophagus, HTN, HLD who presented to [facility] ED from her PCP office for evaluation of chest pain.    She was recently seen at the ophthalmologist and found to have a retinal hemorrhage and was referred back to her PCP for evaluation of hypertension. She was placed on lisinopril about 3 months ago. She also takes low-dose Crestor for hyperlipidemia, but does not always take this. She has no personal cardiac history and no family history of heart disease. She is one of 10 children and thinks that maybe one of her brothers had some heart problems but doesn't know what. She's had several family members with stokes.   She is very active and does all of her own cooking and cleaning. No formal exercise. [place] and noticed that over the past year she's had more dyspnea when walking to her mailbox and back, but no exertional chest pain. She was in her usual state of health untill yesterday when she was moving a vacuum from the car to the house. She had sudden onset of left sided chest pain that was described as sharp and radiated to her left arm. She cannot tell me if she had associated shortness of breath or nausea. She says she cannot remember because there was too much going on. Her husband drove her to her primary care doctor's office. In her primary care provider's office she was noted to be hypertensive with blood pressures 220s/80s. EMS was called from PCP office and she was brought to [facility] for further evaluation. He  currently has a mild central chest tightness but no other complaints.  She denies lower extremity edema, orthopnea or PND. No dizziness or syncope.     Past Medical History  Diagnosis Date  . Tremor   . Postmenopausal   . Hx of adenomatous colonic polyps   . Kyphosis   . Recurrent UTI   . Atrophic vaginitis   . Diverticulosis   . 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 Hyperlipidemia   . Diverticulosis   . Osteopenia of the elderly   . Macular degeneration     "?wet or dry; ? both eyes"  . IBS (irritable bowel syndrome)   . Vitamin D deficiency   . Acute gastric ulcer   . Esophageal stricture   . Fatty liver   . Hiatal hernia   . Allergy   . Anxiety   . Barrett's esophagus   . 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 Basal cell carcinoma of nose      under nose  . Hypertension   . Heart murmur   . Pneumonia X 1  . GERD (gastroesophageal reflux disease)   . Esophageal ulcer   . Depression      Past Surgical History  Procedure Laterality Date  . Back surgery    . Appendectomy    . Cataract extraction w/ intraocular lens  implant, bilateral Bilateral   . Eye surgery    . Colonoscopy    . Polypectomy    .  Breast cyst excision Left   . Lumbar disc surgery      "fragmented disc"  . Squamous cell carcinoma excision      nose tip  . Abdominal hysterectomy  1980  . Esophagogastroduodenoscopy (egd) with esophageal dilation  X 1    Allergies  Allergen Reactions  . Alendronate Sodium Other (See Comments)    Caused chest pain  . Amoxicillin     875 mg only . Can take when lesser strength  . Cephalexin   . Prednisone Other (See Comments)    Reaction is unknown..then states nervousness  . Sudafed [Pseudoephedrine Hcl] Other (See Comments)    insomnia  . Sulfa Antibiotics Hives  . Toprol Xl [Metoprolol Succinate] Other (See Comments)    Unknown reaction  . Trimethoprim     Unknown reaction    I have reviewed the patient's current  medications . aspirin  325 mg Oral Daily  . cholecalciferol  1,000 Units Oral Once per day on Mon Thu  . ciprofloxacin  500 mg Oral BID  . enoxaparin (LOVENOX) injection  40 mg Subcutaneous Q24H  . famotidine  20 mg Oral BID  . feeding supplement (ENSURE ENLIVE)  237 mL Oral BID BM  . lisinopril  10 mg Oral Daily  . omega-3 acid ethyl esters  2 g Oral Daily  . pantoprazole  40 mg Oral Daily  . potassium chloride  20 mEq Oral BID  . rosuvastatin  5 mg Oral QHS  . sodium chloride flush  3 mL Intravenous Q12H   . 0.9 % NaCl with KCl 40 mEq / L 100 mL/hr (10/09/15 2348)   acetaminophen, clonazePAM, fluticasone, ondansetron (ZOFRAN) IV  Prior to Admission medications   Medication Sig Start Date End Date Taking? Authorizing Provider  Cholecalciferol (VITAMIN D3) 2000 UNITS TABS Take 1 tablet by mouth 2 (two) times a week. As needed    Historical Provider, MD  ciprofloxacin (CIPRO) 500 MG tablet Take 1 tablet (500 mg total) by mouth 2 (two) times daily. 10/03/15   Chipper Herb, MD  clonazePAM (KLONOPIN) 0.5 MG tablet Take 1 tablet (0.5 mg total) by mouth 2 (two) times daily as needed (stress). 06/05/15   Chipper Herb, MD  fish oil-omega-3 fatty acids 1000 MG capsule Take 2 g by mouth daily.    Historical Provider, MD  fluticasone (FLONASE) 50 MCG/ACT nasal spray Place 1 spray into both nostrils daily as needed for rhinitis.  04/10/14   Historical Provider, MD  lisinopril (PRINIVIL,ZESTRIL) 10 MG tablet TAKE ONE (1) TABLET EACH DAY 07/17/15   Chipper Herb, MD  Multiple Vitamins-Minerals (ICAPS AREDS FORMULA PO) Take 1 tablet by mouth 2 (two) times daily before a meal.     Historical Provider, MD  potassium chloride (K-DUR) 10 MEQ tablet Take 1 tablet (10 mEq total) by mouth daily. 10/03/15   Chipper Herb, MD  rosuvastatin (CRESTOR) 5 MG tablet Take 1 tablet (5 mg total) by mouth at bedtime. Patient taking differently: Take 5 mg by mouth at bedtime. 1/2 tablet 12/21/14   Chipper Herb, MD      Social History   Social History  . Marital Status: Married    Spouse Name: N/A  . Number of Children: 3  . Years of Education: N/A   Occupational History  . Retired    Social History Main Topics  . Smoking status: Never Smoker   . Smokeless tobacco: Never Used  . Alcohol Use: No  . Drug Use: No  .  Sexual Activity: Yes   Other Topics Concern  . Not on file   Social History Narrative    Family Status  Relation Status Death Age  . Mother Deceased 86  . Father Deceased 43  . Sister Deceased   . Brother Deceased   . Son Alive   . Maternal Uncle Deceased   . Son Alive   . Sister Deceased 102  . Sister Alive   . Sister Deceased   . Sister Deceased   . Sister Alive   . Sister Alive   . Brother Deceased    Family History  Problem Relation Age of Onset  . Colon cancer      nephew  . Stroke Mother   . Heart disease Father     MI  . Cancer Sister     Brain tumor  . Hypertension Sister   . Alcohol abuse Sister   . Colon cancer Brother   . Heart disease Brother   . Diabetes Son   . Diabetes Maternal Uncle   . Diabetes Son   . Hypertension Sister   . Breast cancer Sister   . Hypertension Sister   . Hypertension Sister   . Hypertension Sister   . Stroke Sister   . Hypertension Sister   . Hypertension Sister   . Brain cancer Son     deceased  . Colon polyps Neg Hx   . Esophageal cancer Neg Hx   . Rectal cancer Neg Hx   . Stomach cancer Neg Hx   . Thyroid cancer Other     niece  . Breast cancer Other     nieces x 3      ROS:  Full 14 point review of systems complete and found to be negative unless listed above.  Physical Exam: Blood pressure 158/54, pulse 74, temperature 98 F (36.7 C), temperature source Oral, resp. rate 18, height 5\' 2"  (1.575 m), weight 114 lb 4.8 oz (51.846 kg), SpO2 98 %.  General: Well developed, well nourished, female in no acute distress Head: Eyes PERRLA, No xanthomas.   Normocephalic and atraumatic, oropharynx without edema  or exudate.  Lungs: CTAB Heart: HRRR S1 S2, no rub/gallop, Heart regular rate and rhythm with S1, S2  murmur. pulses are 2+ extrem.   Neck: No carotid bruits. No lymphadenopathy. No  JVD. Abdomen: Bowel sounds present, abdomen soft and non-tender without masses or hernias noted. Msk:  No spine or cva tenderness. No weakness, no joint deformities or effusions. Extremities: No clubbing or cyanosis. No LE edema.  Neuro: Alert and oriented X 3. No focal deficits noted. Psych:  Good affect, responds appropriately Skin: No rashes or lesions noted.  Labs:   Lab Results  Component Value Date   WBC 4.6 10/09/2015   HGB 11.8* 10/09/2015   HCT 36.1 10/09/2015   MCV 86.6 10/09/2015   PLT 180 10/09/2015   No results for input(s): INR in the last 72 hours.  Recent Labs Lab 10/10/15 0633  NA 142  K 3.9  CL 113*  CO2 25  BUN <5*  CREATININE 0.72  CALCIUM 8.1*  GLUCOSE 91   MAGNESIUM  Date Value Ref Range Status  10/09/2015 2.1 1.7 - 2.4 mg/dL Final    Recent Labs  10/09/15 1915 10/10/15 0107 10/10/15 0633  TROPONINI <0.03 <0.03 <0.03   No results for input(s): TROPIPOC in the last 72 hours. No results found for: PROBNP Lab Results  Component Value Date   CHOL 270* 09/30/2015  HDL 100 09/30/2015   LDLCALC 145* 09/30/2015   TRIG 124 09/30/2015   VITAMIN B-12  Date/Time Value Ref Range Status  10/10/2015 01:07 AM 218 180 - 914 pg/mL Final    Comment:    (NOTE) This assay is not validated for testing neonatal or myeloproliferative syndrome specimens for Vitamin B12 levels.    FOLATE  Date/Time Value Ref Range Status  10/10/2015 12:56 AM 22.8 >5.9 ng/mL Final   FERRITIN  Date/Time Value Ref Range Status  10/10/2015 01:07 AM 26 11 - 307 ng/mL Final   TIBC  Date/Time Value Ref Range Status  10/10/2015 01:07 AM 280 250 - 450 ug/dL Final   IRON  Date/Time Value Ref Range Status  10/10/2015 01:07 AM 66 28 - 170 ug/dL Final   RETIC CT PCT  Date/Time Value Ref  Range Status  10/10/2015 01:07 AM 0.7 0.4 - 3.1 % Final    Echo: none  ECG:  NSR with some slight ST depression  Radiology:  Dg Chest 2 View  10/09/2015  CLINICAL DATA:  Chest pain EXAM: CHEST  2 VIEW COMPARISON:  07/10/2013 chest radiograph. FINDINGS: Stable cardiomediastinal silhouette with normal heart size. No pneumothorax. No pleural effusion. Lungs appear clear, with no acute consolidative airspace disease and no pulmonary edema. Stable small sclerotic focus in the right proximal humerus, unchanged back to at least 05/01/2013, suggesting a benign lesion such as a small bone infarct. IMPRESSION: No active cardiopulmonary disease. Electronically Signed   By: Ilona Sorrel M.D.   On: 10/09/2015 19:06    ASSESSMENT AND PLAN:    Principal Problem:   Chest pain Active Problems:   Recurrent UTI   Hyperlipemia   Generalized anxiety disorder   Essential hypertension   Hypokalemia   Anemia  Rachel Walton is a 82 y.o. female with a history of tremor, diverticulosis, recurrent UTIs, hyperlipidemia, IBS, vitamin D deficiency, PUD, esophageal stricture, fatty liver, Barrett's esophagus, HTN, HLD who presented to [facility] ED from her PCP office for evaluation of chest pain.    Chest pain: Reported negative 3. ECG with mild ST depression but no acute ST or T-wave changes. Will plan for nuclear stress test.   HTN: Blood pressures have been elevated. Currently on lisinopril 10 mg daily. I will increase this to 20 mg  HLD: Recent lipid panel with LDL 145. He admitted to not always taking her Crestor 5mg  daily. If found to have CAD will need to be on high intensity statin.  Hypokalemia: Recently noted to have low potassium and started on outpatient supplementation. K was 2.4 on admission. This is being supplemented here.   Signed: Angelena Form, PA-C 10/10/2015 7:56 AM  Pager [phone]  I have seen and examined the patient along with Angelena Form, PA-C.  I have reviewed the chart, notes and  new data.  I agree with PA's note.  Key new complaints: chest pain/dyspnea pattern is compatible with myocardial ischemia; some of her complaints may also be attributable to severe hypokalemia. Key examination changes: normal CV exam Key new findings / data: normal enzymes, very subtle worsening of lateral ST depression compared to 2005 (had mild nonspecific changes then).  PLAN: Appropriate for [employer] (she was unable to attain target heart rate with previous attempt at treadmill stress testing).  Sanda Klein, MD, [place] [phone] 10/10/2015, 10:47 AM

## 2015-10-10 NOTE — Care Management Obs Status (Signed)
MEDICARE OBSERVATION STATUS NOTIFICATION   Patient Details  Name: Rachel Walton MRN: [id number] Date of Birth: [birth year]   Medicare Observation Status Notification Given:  Yes    CrutchfieldAntony Haste, RN 10/10/2015, 10:10 AM

## 2015-10-10 NOTE — Progress Notes (Signed)
Initial Nutrition Assessment  DOCUMENTATION CODES:   Not applicable  INTERVENTION:    Diet advancement after procedure today; resume Ensure Enlive PO BID, each supplement provides 350 kcal and 20 grams of protein  NUTRITION DIAGNOSIS:   Unintentional weight loss related to altered GI function as evidenced by per patient/family report, percent weight loss (4% weight loss within the past 2-3 months).  GOAL:   Patient will meet greater than or equal to 90% of their needs  MONITOR:   Diet advancement, PO intake, Labs, Weight trends, I & O's  REASON FOR ASSESSMENT:   Malnutrition Screening Tool    ASSESSMENT:   82 y.o. female with medical history significant of tremor, colonic polyps, diverticulosis, osteopenia, kyphosis, recurrent UTIs, hyperlipidemia, IBS, vitamin D deficiency, PUD, esophageal stricture, fatty liver, hiatal hernia, Barrett's esophagus, anxiety who comes to the ER referred by her PCP due to CP.  Patient reports 25 lb weight loss over the past 2 years. She attributes her weight loss to ongoing diarrhea. It has improved some over the years, but she still has diarrhea after eating some foods. She is afraid to eat sometimes because she may have diarrhea afterwards.   Weight loss is not significant for the time frame. Nutrition-Focused physical exam completed. Findings are no fat depletion, mild-moderate muscle depletion, and no edema.   Patient is currently NPO for a stress test today.  Diet Order:  Diet NPO time specified  Skin:  Reviewed, no issues  Last BM:  6/1  Height:   Ht Readings from Last 1 Encounters:  10/09/15 5\' 2"  (1.575 m)    Weight:   Wt Readings from Last 1 Encounters:  10/09/15 114 lb 4.8 oz (51.846 kg)    Ideal Body Weight:  50 kg  BMI:  Body mass index is 20.9 kg/(m^2).  Estimated Nutritional Needs:   Kcal:  1300-1500  Protein:  70-80 gm  Fluid:  1.5 L  EDUCATION NEEDS:   No education needs identified at this  time  Molli Barrows, [place], [place], [place] Pager [phone] After Hours Pager [phone]

## 2015-10-10 NOTE — Discharge Summary (Signed)
Physician Discharge Summary  Rachel Walton  [id number]  DOB: 01/21/1930  DOA: 10/09/2015  PCP: Redge Gainer, MD  Admit date: 10/09/2015 Discharge date: 10/10/2015  Time spent: Greater than 30 minutes  Recommendations for Outpatient Follow-up:  1. Dr. Redge Gainer, PCP in one week with repeat labs (CBC & BMP). 2. Dr. Dr. Sanda Klein, Cardiology  Discharge Diagnoses:  Principal Problem:   Chest pain Active Problems:   Recurrent UTI   Hyperlipemia   Generalized anxiety disorder   Essential hypertension   Hypokalemia   Anemia   Pain in the chest   Discharge Condition: Improved & Stable  Diet recommendation: Heart healthy diet.  Filed Weights   10/09/15 1813 10/09/15 2200  Weight: 52.164 kg (115 lb) 51.846 kg (114 lb 4.8 oz)    History of present illness & Hospital course:  82 year old female, married, independent of activities of daily living, PMH of tremors, recurrent UTI, diverticulosis, HLD, chronic diarrhea/IBS, vitamin D deficiency, HTN, GERD, anxiety and depression, recent retinal hemorrhage seen by ophthalmology, presented to [facility] ED via PCPs office on 10/09/15 due to chest pain. She was in her usual state of health until the afternoon of admission when she was getting ready to vacuum her house when she noted sudden onset of left-sided chest pain, sharp, 7/10 in intensity, radiating to left arm with associated tingling and mild transient dyspnea but no diaphoresis, dizziness or lightheadedness. She discussed with her spouse who has heart disease and then decided to go see her PCP. She took a baby aspirin at home. At PCPs office, she was noted to have markedly elevated blood pressures in the 220s/80s. She was given another 3 aspirin tablets and sublingual NTG. She states that by the time she got to the ED, her chest pain had started to subside and completely resolved and lasted approximately one hour in total. She was admitted to telemetry which showed sinus rhythm without  arrhythmias. EKG showed normal sinus rhythm with borderline diffuse ST depression. Troponins were cycled 3 and negative. Cardiology was consulted. She underwent nuclear stress test and 2-D echocardiogram. Discussed with Dr. Sallyanne Kuster, Cardiology with results of stress test-low risk (detailed report as below and 2-D echo. He has cleared her for discharge home. Outpatient follow-up with cardiology. 2-D echo results are as below. Patient states that she used to be on aspirin 2 years ago but due to GI issues i.e. gastric ulcer, esophageal stricture, Barrett's esophagus, was advised by GI to stop taking it and has not been on it since. Will not discharge on aspirin but advised to follow-up with PCP/[facility] regarding consideration to start entry coated aspirin 81 daily if safe to do so.  Essential hypertension: Uncontrolled. Lisinopril was increased from 10 MG daily to 20 MG daily. Close outpatient follow-up and may need further adjustment of antihypertensives. Hyperlipidemia: Recent lipid panel with LDL of 145. Patient not fully compliant with Crestor and has been counseled to take it as prescribed. Severe hypokalemia: Secondary to diarrhea and poor oral intake. Replaced. Magnesium normal. Follow BMP as outpatient and continue home potassium supplements. Recurrent UTI: No urinary symptoms currently. Urine culture showed mixed urogenital flora. Had been on Cipro-no further indication-DC. Anxiety disorder: Continue home medications. Anemia: No bleeding reported. Follow CBC as outpatient. IBS/chronic diarrhea: Used to see Dr. Delfin Edis (retired). States that she has had EGD and colonoscopies. Indicates her diarrhea was worse 2 days back but has since subsided and back to baseline. GI pathogen panel PCR negative. Outpatient follow-up with [facility].    Consultations:  Cardiology  Procedures:  2-D echocardiogram 10/10/15: Study Conclusions  - Left ventricle: The cavity size was normal. Wall thickness  was  increased in a pattern of mild LVH. Systolic function was normal.  The estimated ejection fraction was in the range of 55% to 60%.  Wall motion was normal; there were no regional wall motion  abnormalities. Left ventricular diastolic function parameters  were normal. - Aortic valve: There was mild regurgitation. - Mitral valve: There was mild regurgitation. - Atrial septum: No defect or patent foramen ovale was identified. - Pulmonary arteries: PA peak pressure: 48 mm Hg (S).     Discharge Exam:  Complaints:  Patient was seen this morning prior to stress test. Denied complaints. No further chest pain. Denies dyspnea. No strokelike symptoms.  Filed Vitals:   10/10/15 1236 10/10/15 1237 10/10/15 1240 10/10/15 1434  BP: 144/61 148/62 164/67 161/62  Pulse:    84  Temp:    98.3 F (36.8 C)  TempSrc:    Oral  Resp:    18  Height:      Weight:      SpO2:    100%    General exam: Pleasant elderly female sitting up comfortably in chair this morning. Respiratory system: Clear. No increased work of breathing. Cardiovascular system: S1 & S2 heard, RRR. No JVD, murmurs, gallops, clicks or pedal edema.Telemetry: Sinus rhythm. Gastrointestinal system: Abdomen is nondistended, soft and nontender. Normal bowel sounds heard. Central nervous system: Alert and oriented. No focal neurological deficits. Extremities: Symmetric 5 x 5 power.  Discharge Instructions      Discharge Instructions    Call MD for:  difficulty breathing, headache or visual disturbances    Complete by:  As directed      Call MD for:  extreme fatigue    Complete by:  As directed      Call MD for:  persistant dizziness or light-headedness    Complete by:  As directed      Call MD for:  severe uncontrolled pain    Complete by:  As directed      Diet - low sodium heart healthy    Complete by:  As directed      Increase activity slowly    Complete by:  As directed             Medication List    STOP  taking these medications        ciprofloxacin 500 MG tablet  Commonly known as:  CIPRO      TAKE these medications        clonazePAM 0.5 MG tablet  Commonly known as:  KLONOPIN  Take 1 tablet (0.5 mg total) by mouth 2 (two) times daily as needed (stress).     fish oil-omega-3 fatty acids 1000 MG capsule  Take 2 g by mouth daily.     fluticasone 50 MCG/ACT nasal spray  Commonly known as:  FLONASE  Place 1 spray into both nostrils daily as needed for rhinitis.     ICAPS AREDS FORMULA PO  Take 1 tablet by mouth 2 (two) times daily before a meal.     lisinopril 10 MG tablet  Commonly known as:  PRINIVIL,ZESTRIL  Take 2 tablets (20 mg total) by mouth daily.     potassium chloride 10 MEQ tablet  Commonly known as:  K-DUR  Take 1 tablet (10 mEq total) by mouth daily.     rosuvastatin 5 MG tablet  Commonly known as:  CRESTOR  Take 1 tablet (5 mg total) by mouth at bedtime.     Vitamin D3 2000 units Tabs  Take 1 tablet by mouth 2 (two) times a week. As needed       Follow-up Information    Follow up with Redge Gainer, MD. Schedule an appointment as soon as possible for a visit in 1 week.   Specialty:  Family Medicine   Why:  To be seen with repeat labs (CBC & BMP).   Contact information:   [place] [address] [phone]       Schedule an appointment as soon as possible for a visit with Sanda Klein, MD.   Specialty:  Cardiology   Contact information:   [address] [place]  [place] [address] [phone]       Get Medicines reviewed and adjusted: Please take all your medications with you for your next visit with your Primary MD  Please request your Primary MD to go over all hospital tests and procedure/radiological results at the follow up. Please ask your Primary MD to get all Hospital records sent to his/her office.  If you experience worsening of your admission symptoms, develop shortness of breath, life threatening emergency,  suicidal or homicidal thoughts you must seek medical attention immediately by calling 911 or calling your MD immediately if symptoms less severe.  You must read complete instructions/literature along with all the possible adverse reactions/side effects for all the Medicines you take and that have been prescribed to you. Take any new Medicines after you have completely understood and accept all the possible adverse reactions/side effects.   Do not drive when taking pain medications.   Do not take more than prescribed Pain, Sleep and Anxiety Medications  Special Instructions: If you have smoked or chewed Tobacco in the last 2 yrs please stop smoking, stop any regular Alcohol and or any Recreational drug use.  Wear Seat belts while driving.  Please note  You were cared for by a hospitalist during your hospital stay. Once you are discharged, your primary care physician will handle any further medical issues. Please note that NO REFILLS for any discharge medications will be authorized once you are discharged, as it is imperative that you return to your primary care physician (or establish a relationship with a primary care physician if you do not have one) for your aftercare needs so that they can reassess your need for medications and monitor your lab values.    The results of significant diagnostics from this hospitalization (including imaging, microbiology, ancillary and laboratory) are listed below for reference.    Significant Diagnostic Studies: Dg Chest 2 View  10/09/2015  CLINICAL DATA:  Chest pain EXAM: CHEST  2 VIEW COMPARISON:  07/10/2013 chest radiograph. FINDINGS: Stable cardiomediastinal silhouette with normal heart size. No pneumothorax. No pleural effusion. Lungs appear clear, with no acute consolidative airspace disease and no pulmonary edema. Stable small sclerotic focus in the right proximal humerus, unchanged back to at least 05/01/2013, suggesting a benign lesion such as a  small bone infarct. IMPRESSION: No active cardiopulmonary disease. Electronically Signed   By: Ilona Sorrel M.D.   On: 10/09/2015 19:06   Nm Myocar Multi W/spect W/wall Motion / Ef  10/10/2015  CLINICAL DATA:  Chest pain. EXAM: MYOCARDIAL IMAGING WITH SPECT (REST AND PHARMACOLOGIC-STRESS) GATED LEFT VENTRICULAR WALL MOTION STUDY LEFT VENTRICULAR EJECTION FRACTION TECHNIQUE: Standard myocardial SPECT imaging was performed after resting intravenous injection of 10 mCi  Tc-35m tetrofosmin. Subsequently, intravenous infusion of Lexiscan was performed under the supervision of the Cardiology staff. At peak effect of the drug, 30 mCi Tc-66m tetrofosmin was injected intravenously and standard myocardial SPECT imaging was performed. Quantitative gated imaging was also performed to evaluate left ventricular wall motion, and estimate left ventricular ejection fraction. COMPARISON:  None. FINDINGS: Perfusion: Mildly decreased uptake along the inferior wall on both the rest and stress images. This probably represents diaphragmatic attenuation due to the normal wall motion in this area. No areas are suspicious for reversibility or ischemia. Wall Motion: Normal left ventricular wall motion. No left ventricular dilation. Left Ventricular Ejection Fraction: 73 % End diastolic volume 57 ml End systolic volume 15 ml IMPRESSION: 1. No reversible ischemia or infarction. 2. Normal left ventricular wall motion. 3. Left ventricular ejection fraction is 73%. 4. Non invasive risk stratification*: Low *2012 Appropriate Use Criteria for Coronary Revascularization Focused Update: J Am Coll Cardiol. [id number]. http://content.airportbarriers.com.aspx?articleid=[email or web address] Electronically Signed   By: Markus Daft M.D.   On: 10/10/2015 16:31    Microbiology: Recent Results (from the past 240 hour(s))  Fecal occult blood, imunochemical     Status: None   Collection Time: 10/02/15 11:51 AM  Result Value Ref Range Status   Fecal Occult  Bld Negative Negative Final  Urine culture     Status: None   Collection Time: 10/03/15 12:05 PM  Result Value Ref Range Status   Urine Culture, Routine Final report  Final   Urine Culture result 1 Comment  Final    Comment: Mixed urogenital flora 25,000-50,000 colony forming units per mL   Microscopic Examination     Status: Abnormal   Collection Time: 10/03/15 12:06 PM  Result Value Ref Range Status   WBC, UA >30 (A) 0 -  5 /hpf Final   RBC, UA 0-2 0 -  2 /hpf Final   Epithelial Cells (non renal) 0-10 0 - 10 /hpf Final   Bacteria, UA Moderate (A) None seen/Few Final  Gastrointestinal Panel by PCR , Stool     Status: None   Collection Time: 10/10/15 12:06 AM  Result Value Ref Range Status   Campylobacter species NOT DETECTED NOT DETECTED Final   Plesimonas shigelloides NOT DETECTED NOT DETECTED Final   Salmonella species NOT DETECTED NOT DETECTED Final   Yersinia enterocolitica NOT DETECTED NOT DETECTED Final   Vibrio species NOT DETECTED NOT DETECTED Final   Vibrio cholerae NOT DETECTED NOT DETECTED Final   Enteroaggregative E coli (EAEC) NOT DETECTED NOT DETECTED Final   Enteropathogenic E coli (EPEC) NOT DETECTED NOT DETECTED Final   Enterotoxigenic E coli (ETEC) NOT DETECTED NOT DETECTED Final   Shiga like toxin producing E coli (STEC) NOT DETECTED NOT DETECTED Final   E. coli O157 NOT DETECTED NOT DETECTED Final   Shigella/Enteroinvasive E coli (EIEC) NOT DETECTED NOT DETECTED Final   Cryptosporidium NOT DETECTED NOT DETECTED Final   Cyclospora cayetanensis NOT DETECTED NOT DETECTED Final   Entamoeba histolytica NOT DETECTED NOT DETECTED Final   Giardia lamblia NOT DETECTED NOT DETECTED Final   Adenovirus F40/41 NOT DETECTED NOT DETECTED Final   Astrovirus NOT DETECTED NOT DETECTED Final   Norovirus GI/GII NOT DETECTED NOT DETECTED Final   Rotavirus A NOT DETECTED NOT DETECTED Final   Sapovirus (I, II, IV, and V) NOT DETECTED NOT DETECTED Final     Labs: Basic  Metabolic Panel:  Recent Labs Lab 10/09/15 1915 10/10/15 0633  NA 139 142  K 2.4* 3.9  CL 105  113*  CO2 27 25  GLUCOSE 98 91  BUN 6 <5*  CREATININE 0.74 0.72  CALCIUM 8.6* 8.1*  MG 2.1  --    Liver Function Tests: No results for input(s): AST, ALT, ALKPHOS, BILITOT, PROT, ALBUMIN in the last 168 hours. No results for input(s): LIPASE, AMYLASE in the last 168 hours. No results for input(s): AMMONIA in the last 168 hours. CBC:  Recent Labs Lab 10/09/15 1915  WBC 4.6  NEUTROABS 2.1  HGB 11.8*  HCT 36.1  MCV 86.6  PLT 180   Cardiac Enzymes:  Recent Labs Lab 10/09/15 1915 10/10/15 0107 10/10/15 0633  TROPONINI <0.03 <0.03 <0.03   BNP: BNP (last 3 results) No results for input(s): BNP in the last 8760 hours.  ProBNP (last 3 results) No results for input(s): PROBNP in the last 8760 hours.  CBG: No results for input(s): GLUCAP in the last 168 hours.     Signed:  Vernell Leep, MD, FACP, FHM. [facility] Pager [phone]  If 7PM-7AM, please contact night-coverage [email or web address] Password [facility] 10/10/2015, 6:44 PM

## 2015-10-10 NOTE — Plan of Care (Signed)
Problem: Education: Goal: Knowledge of [facility] Education information/materials will improve Outcome: Completed/Met Date Met:  10/10/15 Pt educated on pain scale, patient safety, and new/current medications. Pt verbalized understanding. Plan of care reviewed with pt and family.

## 2015-10-11 ENCOUNTER — Telehealth: Payer: Self-pay | Admitting: *Deleted

## 2015-10-11 ENCOUNTER — Telehealth: Payer: Self-pay | Admitting: Family Medicine

## 2015-10-11 NOTE — Telephone Encounter (Signed)
Pt called and appt made BP med was changed in hosp

## 2015-10-14 ENCOUNTER — Encounter: Payer: Self-pay | Admitting: Family Medicine

## 2015-10-14 ENCOUNTER — Ambulatory Visit (INDEPENDENT_AMBULATORY_CARE_PROVIDER_SITE_OTHER): Payer: Medicare Other | Admitting: Family Medicine

## 2015-10-14 VITALS — BP 153/63 | HR 70 | Temp 97.7°F | Ht 62.0 in | Wt 114.0 lb

## 2015-10-14 DIAGNOSIS — F329 Major depressive disorder, single episode, unspecified: Secondary | ICD-10-CM | POA: Diagnosis not present

## 2015-10-14 DIAGNOSIS — Z09 Encounter for follow-up examination after completed treatment for conditions other than malignant neoplasm: Secondary | ICD-10-CM | POA: Diagnosis not present

## 2015-10-14 DIAGNOSIS — E785 Hyperlipidemia, unspecified: Secondary | ICD-10-CM | POA: Diagnosis not present

## 2015-10-14 DIAGNOSIS — R079 Chest pain, unspecified: Secondary | ICD-10-CM

## 2015-10-14 DIAGNOSIS — I10 Essential (primary) hypertension: Secondary | ICD-10-CM

## 2015-10-14 DIAGNOSIS — F411 Generalized anxiety disorder: Secondary | ICD-10-CM

## 2015-10-14 DIAGNOSIS — F32A Depression, unspecified: Secondary | ICD-10-CM

## 2015-10-14 MED ORDER — ESCITALOPRAM OXALATE 5 MG PO TABS: 5.0000 mg | ORAL_TABLET | Freq: Every day | ORAL | Status: DC

## 2015-10-14 NOTE — Patient Instructions (Signed)
Take Crestor regularly every night, 5 mg Take Lexapro regularly at nighttime, 5 mg----if not better on return we will consider setting up a visit with a psychologist to talk about the stress of losing your son. Avoid caffeine Continue to take clonazepam only as needed. Follow-up with cardiologist as planned Stay active and gradually increase activity and drink plenty of fluids

## 2015-10-14 NOTE — Progress Notes (Signed)
Subjective:    Patient ID: Rachel Walton, female    DOB: 11/04/1929, 82 y.o.   MRN: [id number]  HPI Patient here today for hospital follow up from [facility]. She was seen for chest pains. The patient was recently admitted to the hospital because of chest pain. She had a nuclear stress test and this was good and she had an ejection fraction between 55 and 60. She is found to have hypokalemia. It is also mentioned about her depression. She has a follow-up appointment with Dr. Keene Breath. She says she is somewhat weak today but is thankful that the cardiac workup was negative. She is now taking lisinopril 20 daily and 10 mEq of potassium daily. She will get a CBC BMP and LFTs today. We will also consider starting her on a low-dose of an antidepressant. As a result of the hospitalizations she now will take her Crestor more regularly because of the elevated cholesterol. The patient was being treated for urinary tract infection and did not complete all the Cipro. She now denies any further chest pain or shortness of breath. She only feels somewhat tired and weak. She denies any trouble with her GI tract but is somewhat reluctant to take a baby aspirin because of the history of an esophageal ulcer in the past and we'll wait to see the gastroenterologist before she takes the coated baby aspirin. She has not seen any blood in the stool or had any black tarry bowel movements. She has no complaints with her voiding today. We will recheck a urinalysis. The patient lost her middle son about a year ago. He had a brain tumor. Since that time she's been very busy with health issues with her husband on the replacement etc. has really not had time degrees the loss of her son. We talked about this today. We will try a low-dose of an antidepressant and see her back in about 3 weeks and at that time if she still not doing any better with her grief reaction that may be somewhat delayed from her son's death, we will arrange her to see a  counselor for further evaluation. She will continue to take her Klonopin or clonazepam only as needed. We will also arrange for her to see the heart doctor as mentioned above.      Patient Active Problem List   Diagnosis Date Noted  . Pain in the chest   . Chest pain 10/09/2015  . Hypokalemia 10/09/2015  . Anemia 10/09/2015  . Esophageal stricture 12/11/2013  . Allergic rhinitis due to pollen 12/11/2013  . Essential hypertension 12/11/2013  . Generalized anxiety disorder 08/21/2013  . Gall stones 06/15/2013  . Hyperlipemia 11/21/2012  . Tremor   . Postmenopausal   . Colon polyp   . Osteopenia of the elderly   . Kyphosis   . Recurrent UTI   . Atrophic vaginitis   . Diverticulosis   . Meningitis, history of    Outpatient Encounter Prescriptions as of 10/14/2015  Medication Sig  . Cholecalciferol (VITAMIN D3) 2000 UNITS TABS Take 1 tablet by mouth 2 (two) times a week. As needed  . clonazePAM (KLONOPIN) 0.5 MG tablet Take 1 tablet (0.5 mg total) by mouth 2 (two) times daily as needed (stress).  . fish oil-omega-3 fatty acids 1000 MG capsule Take 2 g by mouth daily.  . fluticasone (FLONASE) 50 MCG/ACT nasal spray Place 1 spray into both nostrils daily as needed for rhinitis.   Marland Kitchen lisinopril (PRINIVIL,ZESTRIL) 10 MG tablet Take 2  tablets (20 mg total) by mouth daily.  . Multiple Vitamins-Minerals (ICAPS AREDS FORMULA PO) Take 1 tablet by mouth 2 (two) times daily before a meal.   . potassium chloride (K-DUR) 10 MEQ tablet Take 1 tablet (10 mEq total) by mouth daily.  . rosuvastatin (CRESTOR) 5 MG tablet Take 1 tablet (5 mg total) by mouth at bedtime. (Patient taking differently: Take 5 mg by mouth 2 (two) times a week. )   Facility-Administered Encounter Medications as of 10/14/2015  Medication  . aspirin tablet 325 mg      Review of Systems  HENT: Negative.        Seasonal allergies  Eyes: Negative.   Respiratory: Negative.   Cardiovascular: Negative.   Gastrointestinal:  Negative.   Endocrine: Negative.   Genitourinary: Negative.   Musculoskeletal: Negative.   Skin: Negative.   Allergic/Immunologic: Negative.   Neurological: Positive for weakness.  Hematological: Negative.   Psychiatric/Behavioral: Negative.        Some depression       Objective:   Physical Exam  Constitutional: She is oriented to person, place, and time. She appears well-developed and well-nourished. No distress.  HENT:  Head: Normocephalic and atraumatic.  Eyes: Conjunctivae and EOM are normal. Pupils are equal, round, and reactive to light. Right eye exhibits no discharge. Left eye exhibits no discharge. No scleral icterus.  Neck: Normal range of motion. Neck supple. No thyromegaly present.  Cardiovascular: Normal rate, regular rhythm and normal heart sounds.  Exam reveals no gallop and no friction rub.   No murmur heard. The rate is regular at 72/m  Pulmonary/Chest: Effort normal and breath sounds normal. No respiratory distress. She has no wheezes. She has no rales. She exhibits no tenderness.  Clear anteriorly and posteriorly  Abdominal: Soft. Bowel sounds are normal. She exhibits no mass. There is no tenderness. There is no rebound and no guarding.  No abdominal tenderness masses or organ enlargement  Musculoskeletal: Normal range of motion. She exhibits no edema.  Lymphadenopathy:    She has no cervical adenopathy.  Neurological: She is alert and oriented to person, place, and time.  Skin: Skin is warm and dry. No rash noted.  Psychiatric: She has a normal mood and affect. Her behavior is normal. Judgment and thought content normal.  Remains somewhat depressed and cannot understand why husband cannot feel the same way.  Nursing note and vitals reviewed.  BP 153/63 mmHg  Pulse 70  Temp(Src) 97.7 F (36.5 C) (Oral)  Ht '5\' 2"'$  (1.575 m)  Wt 114 lb (51.71 kg)  BMI 20.85 kg/m2        Assessment & Plan:  1. Hospital discharge follow-up -The patient has had no  further chest pain. -We will start her on an antidepressant and continue with the antianxiety medicine as needed - CBC with Differential/Platelet - Hepatic function panel - BMP8+EGFR - Urinalysis, Complete - Urine culture - Ambulatory referral to Cardiology  2. Essential hypertension -The blood pressure remains slightly elevated today and we will continue to monitor this. She will continue with lisinopril 20 mg and 10 mEq of potassium daily. - BMP8+EGFR - Ambulatory referral to Cardiology  3. Generalized anxiety disorder -Start Lexapro 5 mg at bedtime and continue with clonazepam as needed  4. Hyperlipemia -The patient is agreeable to taking Crestor 5 mg at bedtime where as in the past it was a hit or miss deal. - Ambulatory referral to Cardiology  5. Chest pain, unspecified chest pain type -She has had no  further chest pains that she has been at home.  6. Depression -She admits that she is still dealing with the loss of her son. We will start with an antidepressant see her back in about 3 weeks and if she continues to have problems we'll consider scheduling her to speak with a counselor about her delayed grief reaction.  Meds ordered this encounter  Medications  . escitalopram (LEXAPRO) 5 MG tablet    Sig: Take 1 tablet (5 mg total) by mouth daily.    Dispense:  30 tablet    Refill:  2   Patient Instructions  Take Crestor regularly every night, 5 mg Take Lexapro regularly at nighttime, 5 mg----if not better on return we will consider setting up a visit with a psychologist to talk about the stress of losing your son. Avoid caffeine Continue to take clonazepam only as needed. Follow-up with cardiologist as planned Stay active and gradually increase activity and drink plenty of fluids   Arrie Senate MD

## 2015-10-15 ENCOUNTER — Telehealth: Payer: Self-pay | Admitting: Family Medicine

## 2015-10-15 LAB — CBC WITH DIFFERENTIAL/PLATELET
BASOS ABS: 0.1 10*3/uL (ref 0.0–0.2)
Basos: 1 %
EOS (ABSOLUTE): 0.1 10*3/uL (ref 0.0–0.4)
Eos: 2 %
HEMOGLOBIN: 13.2 g/dL (ref 11.1–15.9)
Hematocrit: 40.1 % (ref 34.0–46.6)
Immature Grans (Abs): 0 10*3/uL (ref 0.0–0.1)
Immature Granulocytes: 0 %
LYMPHS ABS: 2.5 10*3/uL (ref 0.7–3.1)
Lymphs: 40 %
MCH: 29 pg (ref 26.6–33.0)
MCHC: 32.9 g/dL (ref 31.5–35.7)
MCV: 88 fL (ref 79–97)
MONOCYTES: 6 %
MONOS ABS: 0.4 10*3/uL (ref 0.1–0.9)
NEUTROS ABS: 3.3 10*3/uL (ref 1.4–7.0)
Neutrophils: 51 %
Platelets: 240 10*3/uL (ref 150–379)
RBC: 4.55 x10E6/uL (ref 3.77–5.28)
RDW: 14.1 % (ref 12.3–15.4)
WBC: 6.3 10*3/uL (ref 3.4–10.8)

## 2015-10-15 LAB — BMP8+EGFR
BUN / CREAT RATIO: 14 (ref 12–28)
BUN: 10 mg/dL (ref 8–27)
CALCIUM: 8.9 mg/dL (ref 8.7–10.3)
CHLORIDE: 103 mmol/L (ref 96–106)
CO2: 25 mmol/L (ref 18–29)
CREATININE: 0.69 mg/dL (ref 0.57–1.00)
GFR calc Af Amer: 93 mL/min/{1.73_m2} (ref >59)
GFR calc non Af Amer: 81 mL/min/{1.73_m2} (ref >59)
GLUCOSE: 88 mg/dL (ref 65–99)
Potassium: 4.1 mmol/L (ref 3.5–5.2)
Sodium: 143 mmol/L (ref 134–144)

## 2015-10-15 LAB — URINE CULTURE

## 2015-10-15 LAB — HEPATIC FUNCTION PANEL
ALT: 13 IU/L (ref 0–32)
AST: 20 IU/L (ref 0–40)
Albumin: 4.2 g/dL (ref 3.5–4.7)
Alkaline Phosphatase: 55 IU/L (ref 39–117)
Bilirubin Total: 0.3 mg/dL (ref 0.0–1.2)
Bilirubin, Direct: 0.09 mg/dL (ref 0.00–0.40)
TOTAL PROTEIN: 6.4 g/dL (ref 6.0–8.5)

## 2015-10-15 NOTE — Telephone Encounter (Signed)
Spoke with pt regarding diarrhea She will try Imodium and RTC if persists

## 2015-10-22 NOTE — Telephone Encounter (Signed)
Erroneous Encounter

## 2015-11-06 ENCOUNTER — Ambulatory Visit (INDEPENDENT_AMBULATORY_CARE_PROVIDER_SITE_OTHER): Payer: Medicare Other | Admitting: Internal Medicine

## 2015-11-06 ENCOUNTER — Encounter: Payer: Self-pay | Admitting: Internal Medicine

## 2015-11-06 VITALS — BP 164/68 | HR 64 | Ht 61.0 in | Wt 112.0 lb

## 2015-11-06 DIAGNOSIS — K52832 Lymphocytic colitis: Secondary | ICD-10-CM

## 2015-11-06 DIAGNOSIS — K529 Noninfective gastroenteritis and colitis, unspecified: Secondary | ICD-10-CM

## 2015-11-06 DIAGNOSIS — K648 Other hemorrhoids: Secondary | ICD-10-CM

## 2015-11-06 MED ORDER — BUDESONIDE 3 MG PO CPEP: ORAL_CAPSULE | ORAL | Status: DC

## 2015-11-06 NOTE — Progress Notes (Signed)
Subjective:    Patient ID: Rachel Walton, female    DOB: 09/25/1929, 82 y.o.   MRN: [id number]  HPI Rachel Walton is an 82 year old female with a recent diagnosis of lymphocytic colitis, a long-standing history of IBS, GERD with Schatzki's ring, remote colon polyps and diverticulosis who is here for follow-up. She came for a colonoscopy to evaluate worsening diarrhea which was performed on 08/15/2015. This revealed a few sigmoid diverticuli and internal hemorrhoids. The exam was otherwise unremarkable throughout the colon. Random biopsies showed lymphocytic colitis.  We contacted the patient to begin Entocort therapy. At the time my nurse called her she stated her diarrhea had improved due to a probiotic. Entocort was never started.  Today she reports she continues to have diarrhea and loose stools occurring worse after eating. This can be at night but she often avoids eating food late in the evening to avoid nocturnal stools. At times stools can be watery. She can develop lower abdominal cramping and gas relieved by defecation. She recently has had some minor bleeding which she feels is hemorrhoidal. It has improved with Preparation H over-the-counter.  After talking to her more I am not sure she understood the diagnosis when contacted by Rosanne Sack, RN. It does seem that her diarrhea was at one time very high volume and more frequent than it is currently the symptoms remain.  Review of Systems As per history of present illness, otherwise negative  Current Medications, Allergies, Past Medical History, Past Surgical History, Family History and Social History were reviewed in [employer] [employer] record.     Objective:   Physical Exam BP 164/68 mmHg  Pulse 64  Ht 5\' 1"  (1.549 m)  Wt 112 lb (50.803 kg)  BMI 21.17 kg/m2 Constitutional: Well-developed and well-nourished. No distress. HEENT: Normocephalic and atraumatic. Oropharynx is clear and moist. No oropharyngeal exudate.  Conjunctivae are normal.  No scleral icterus. Neck: Neck supple. Trachea midline. Cardiovascular: Normal rate, regular rhythm and intact distal pulses. 2/6 SEM Pulmonary/chest: Effort normal and breath sounds normal. No wheezing, rales or rhonchi. Abdominal: Soft, nontender, nondistended. Bowel sounds active throughout.  Extremities: no clubbing, cyanosis, or edema Neurological: Alert and oriented to person place and time. Skin: Skin is warm and dry.  Psychiatric: Normal mood and affect. Behavior is normal.  CBC    Component Value Date/Time   WBC 6.3 10/14/2015 1502   WBC 4.6 10/09/2015 1915   WBC 5.0 09/17/2014 1231   RBC 4.55 10/14/2015 1502   RBC 4.11 10/10/2015 0107   RBC 4.17 10/09/2015 1915   RBC 4.84 09/17/2014 1231   HGB 11.8* 10/09/2015 1915   HGB 13.5 09/17/2014 1231   HCT 40.1 10/14/2015 1502   HCT 36.1 10/09/2015 1915   HCT 42.1 09/17/2014 1231   PLT 240 10/14/2015 1502   PLT 180 10/09/2015 1915   MCV 88 10/14/2015 1502   MCV 86.6 10/09/2015 1915   MCV 86.9 09/17/2014 1231   MCH 29.0 10/14/2015 1502   MCH 28.3 10/09/2015 1915   MCH 27.8 09/17/2014 1231   MCHC 32.9 10/14/2015 1502   MCHC 32.7 10/09/2015 1915   MCHC 32.0 09/17/2014 1231   RDW 14.1 10/14/2015 1502   RDW 13.5 10/09/2015 1915   LYMPHSABS 2.5 10/14/2015 1502   LYMPHSABS 2.1 10/09/2015 1915   MONOABS 0.3 10/09/2015 1915   EOSABS 0.1 10/14/2015 1502   EOSABS 0.1 10/09/2015 1915   EOSABS 0.1 04/12/2014 1233   BASOSABS 0.1 10/14/2015 1502   BASOSABS 0.1 10/09/2015  1915    CMP     Component Value Date/Time   NA 143 10/14/2015 1502   NA 142 10/10/2015 0633   K 4.1 10/14/2015 1502   CL 103 10/14/2015 1502   CO2 25 10/14/2015 1502   GLUCOSE 88 10/14/2015 1502   GLUCOSE 91 10/10/2015 0633   BUN 10 10/14/2015 1502   BUN <5* 10/10/2015 0633   CREATININE 0.69 10/14/2015 1502   CREATININE 0.90 11/21/2012 1632   CALCIUM 8.9 10/14/2015 1502   PROT 6.4 10/14/2015 1502   PROT 6.7 11/21/2012 1632     ALBUMIN 4.2 10/14/2015 1502   ALBUMIN 4.4 11/21/2012 1632   AST 20 10/14/2015 1502   ALT 13 10/14/2015 1502   ALKPHOS 55 10/14/2015 1502   BILITOT 0.3 10/14/2015 1502   BILITOT 0.5 04/12/2014 0838   GFRNONAA 81 10/14/2015 1502   GFRNONAA 61 11/21/2012 1632   GFRAA 93 10/14/2015 1502   GFRAA 70 11/21/2012 1632       Assessment & Plan:   82 year old female with a recent diagnosis of lymphocytic colitis, a long-standing history of IBS, GERD with Schatzki's ring, remote colon polyps and diverticulosis who is here for follow-up.  1. Lymphocytic colitis -- she has had a chronic diarrhea. We spent a great deal of time today discussing this diagnosis including natural history and treatment. She continues to have issues with loose stools which are more frequent than normal for her. I feel that she would be an excellent candidate for budesonide and likely respond very favorably. Begin budesonide 6 mg daily. Plan 6 mg daily 2 months then 3 mg daily until follow-up. She was asked to notify me if she doesn't improve or has any side effects with this medication.  2. Symptomatic internal hemorrhoids -- at present responding to Preparation H. Hemorrhoidal banding could be pursued in the future if persistent symptoms which are refractory to general medical treatment of hemorrhoids  She has follow-up with Dr. [name], her primary care doctor tomorrow  25 minutes spent with the patient today. Greater than 50% was spent in counseling and coordination of care with the patient

## 2015-11-06 NOTE — Patient Instructions (Signed)
We have sent the following medications to your pharmacy for you to pick up at your convenience: Entocort 6 mg x 2 months, then 3 mg daily thereafter.  Please purchase the following medications over the counter and take as directed: Preparation H  Follow up with Dr Hilarie Fredrickson in 3-4 months.  If you are age 80 or older, your body mass index should be between 23-30. Your Body mass index is 21.17 kg/(m^2). If this is out of the aforementioned range listed, please consider follow up with your Primary Care Provider.  If you are age 64 or younger, your body mass index should be between 19-25. Your Body mass index is 21.17 kg/(m^2). If this is out of the aformentioned range listed, please consider follow up with your Primary Care Provider.

## 2015-11-07 ENCOUNTER — Ambulatory Visit (INDEPENDENT_AMBULATORY_CARE_PROVIDER_SITE_OTHER): Payer: Medicare Other | Admitting: Family Medicine

## 2015-11-07 ENCOUNTER — Encounter: Payer: Self-pay | Admitting: Family Medicine

## 2015-11-07 VITALS — BP 161/67 | HR 64 | Temp 97.1°F | Ht 61.0 in | Wt 111.0 lb

## 2015-11-07 DIAGNOSIS — K52832 Lymphocytic colitis: Secondary | ICD-10-CM | POA: Diagnosis not present

## 2015-11-07 DIAGNOSIS — G0490 Encephalitis and encephalomyelitis, unspecified: Secondary | ICD-10-CM | POA: Insufficient documentation

## 2015-11-07 DIAGNOSIS — F329 Major depressive disorder, single episode, unspecified: Secondary | ICD-10-CM

## 2015-11-07 DIAGNOSIS — F32A Depression, unspecified: Secondary | ICD-10-CM | POA: Insufficient documentation

## 2015-11-07 NOTE — Patient Instructions (Signed)
Continue the medicine as recommended by gastroenterologist Continue the Lexapro 5 mg Check blood pressures at home Come by the office in about 4 weeks and let the nurse checked the blood pressure and weight and report that to me Follow-up with gastroenterologist as planned

## 2015-11-07 NOTE — Progress Notes (Signed)
Subjective:    Patient ID: Rachel Walton, female    DOB: April 12, 1930, 82 y.o.   MRN: [id number]  HPI Patient here today for 3 week follow up on depression medication. The patient is doing better with her depression. She saw the gastroenterologist yesterday she has lymphocytic colitis and he is starting her on a treatment to help this. She seems to be somewhat anxious about starting the medicine because she saw that it raises her blood pressure. She has not started it yet. I encouraged her to take the medicine as the gastroenterologist directed. She says that the Lexapro 5 is helping her depression. She said she wanted to stop taking it. I immediately said did not stop taking it and continue to take it as directed and she agreed she would.     Patient Active Problem List   Diagnosis Date Noted  . 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 Pain in the chest   . Chest pain 10/09/2015  . Hypokalemia 10/09/2015  . Anemia 10/09/2015  . Esophageal stricture 12/11/2013  . Allergic rhinitis due to pollen 12/11/2013  . Essential hypertension 12/11/2013  . Generalized anxiety disorder 08/21/2013  . Gall stones 06/15/2013  . Hyperlipemia 11/21/2012  . Tremor   . Postmenopausal   . Colon polyp   . Osteopenia of the elderly   . Kyphosis   . Recurrent UTI   . Atrophic vaginitis   . Diverticulosis    Outpatient Encounter Prescriptions as of 11/07/2015  Medication Sig  . budesonide (ENTOCORT EC) 3 MG 24 hr capsule Take 2 tablets daily x 2 months, then decrease to once daily dosing.  . Cholecalciferol (VITAMIN D3) 2000 UNITS TABS Take 1 tablet by mouth 2 (two) times a week. As needed  . clonazePAM (KLONOPIN) 0.5 MG tablet Take 1 tablet (0.5 mg total) by mouth 2 (two) times daily as needed (stress).  Marland Kitchen escitalopram (LEXAPRO) 5 MG tablet Take 1 tablet (5 mg total) by mouth daily.  . fish oil-omega-3 fatty acids 1000 MG capsule Take 2 g by mouth daily.  . fluticasone (FLONASE) 50 MCG/ACT nasal spray Place  1 spray into both nostrils daily as needed for rhinitis.   Marland Kitchen lisinopril (PRINIVIL,ZESTRIL) 10 MG tablet Take 2 tablets (20 mg total) by mouth daily.  . Multiple Vitamins-Minerals (ICAPS AREDS FORMULA PO) Take 1 tablet by mouth 2 (two) times daily before a meal.   . potassium chloride (K-DUR) 10 MEQ tablet Take 1 tablet (10 mEq total) by mouth daily.  . rosuvastatin (CRESTOR) 5 MG tablet Take 1 tablet (5 mg total) by mouth at bedtime. (Patient taking differently: Take 5 mg by mouth 2 (two) times a week. )   Facility-Administered Encounter Medications as of 11/07/2015  Medication  . aspirin tablet 325 mg     Review of Systems  Constitutional: Negative.   HENT: Negative.   Eyes: Negative.   Respiratory: Negative.   Cardiovascular: Negative.   Gastrointestinal: Negative.   Endocrine: Negative.   Genitourinary: Negative.   Musculoskeletal: Negative.   Skin: Negative.   Allergic/Immunologic: Negative.   Neurological: Negative.   Hematological: Negative.   Psychiatric/Behavioral: Negative.        Objective:   Physical Exam  Constitutional: She is oriented to person, place, and time.  Small framed and alert  HENT:  Head: Normocephalic and atraumatic.  Eyes: Conjunctivae and EOM are normal. Pupils are equal, round, and reactive to light. Right eye exhibits no discharge. Left eye exhibits no discharge. No scleral icterus.  Neck:  Normal range of motion. Neck supple. No thyromegaly present.  Cardiovascular: Normal rate, regular rhythm and normal heart sounds.   No murmur heard. Pulmonary/Chest: Effort normal and breath sounds normal. No respiratory distress. She has no wheezes. She has no rales. She exhibits no tenderness.  Abdominal: Soft. Bowel sounds are normal. She exhibits no mass. There is no tenderness. There is no rebound and no guarding.  Musculoskeletal: Normal range of motion. She exhibits no edema.  Lymphadenopathy:    She has no cervical adenopathy.  Neurological: She is  alert and oriented to person, place, and time.  Skin: Skin is warm and dry. No rash noted.  Psychiatric: She has a normal mood and affect. Her behavior is normal. Judgment and thought content normal.  The patient was smiling and her mood seemed to be more positive than previously.  Nursing note and vitals reviewed.  BP 161/67 mmHg  Pulse 64  Temp(Src) 97.1 F (36.2 C) (Oral)  Ht 5\' 1"  (1.549 m)  Wt 111 lb (50.349 kg)  BMI 20.98 kg/m2        Assessment & Plan:  1. Lymphocytic colitis -Follow-up with gastroenterology -Take medicine as prescribed by the gastroenterologist  2. Depression -Continue Lexapro 5 mg -Have nurse check blood pressure and weight in 4 weeks  Patient Instructions  Continue the medicine as recommended by gastroenterologist Continue the Lexapro 5 mg Check blood pressures at home Come by the office in about 4 weeks and let the nurse checked the blood pressure and weight and report that to me Follow-up with gastroenterologist as planned   Arrie Senate MD

## 2015-11-13 ENCOUNTER — Telehealth: Payer: Self-pay | Admitting: Internal Medicine

## 2015-11-13 MED ORDER — BISMUTH SUBSALICYLATE 262 MG PO TABS: 786.0000 mg | ORAL_TABLET | Freq: Three times a day (TID) | ORAL | Status: DC

## 2015-11-13 NOTE — Telephone Encounter (Signed)
Stop budesonide Given intolerance to budesonide I will recommend that we begin bismuth subsalicylate (three 99991111 mg tablets three times daily). This is been proven to improve microscopic colitis Plan this for at least 8 weeks and then follow-up Call if not effective Let her know this will turn her stools very dark brown to black

## 2015-11-13 NOTE — Telephone Encounter (Signed)
Left message for pt to call back.  Pt states she started taking the budesonide on 6/30. States the first 2 days she felt ok but on the third day she reports she felt like her lips were swelling, her stomach was "aggrivated" and she had bad diarrhea and she could not sleep. Pt stopped taking the med. And wanted to let Dr. Hilarie Fredrickson know. Please advise of any further recommendations.

## 2015-11-13 NOTE — Telephone Encounter (Signed)
Pt aware, script sent to pharmacy. Pt scheduled to see Dr. Hilarie Fredrickson 01/17/16@11am , appt letter mailed to pt.

## 2015-11-18 ENCOUNTER — Telehealth: Payer: Self-pay | Admitting: Internal Medicine

## 2015-11-18 NOTE — Telephone Encounter (Signed)
Discussed with pt that the med is causing the black dark stools and this is to be expected. Pt verbalized understanding.

## 2015-11-20 LAB — URINALYSIS, COMPLETE
Bilirubin, UA: NEGATIVE
Glucose, UA: NEGATIVE
Ketones, UA: NEGATIVE
Nitrite, UA: NEGATIVE
PH UA: 5.5 (ref 5.0–7.5)
Protein, UA: NEGATIVE
SPEC GRAV UA: 1.015 (ref 1.005–1.030)
Urobilinogen, Ur: 0.2 mg/dL (ref 0.2–1.0)

## 2015-11-20 LAB — MICROSCOPIC EXAMINATION

## 2015-11-27 ENCOUNTER — Telehealth: Payer: Self-pay | Admitting: Family Medicine

## 2015-11-27 NOTE — Telephone Encounter (Signed)
Patient has an appointment scheduled with Cardiology and GI to discuss blood pressure and eating issues.   She will try to increase her intake of nutritional drinks and foods that do not upset her bowels. Advised her to call specialists for more advise.   She was concerned with low systolic blood pressures  of 110 and 118 .She was advised to discuss with Cardiology.

## 2015-12-06 ENCOUNTER — Other Ambulatory Visit: Payer: Self-pay | Admitting: Family Medicine

## 2015-12-06 ENCOUNTER — Ambulatory Visit: Payer: Medicare Other | Admitting: *Deleted

## 2015-12-06 VITALS — BP 128/65 | Wt 111.2 lb

## 2015-12-06 DIAGNOSIS — E876 Hypokalemia: Secondary | ICD-10-CM

## 2015-12-06 DIAGNOSIS — Z0130 Encounter for examination of blood pressure without abnormal findings: Secondary | ICD-10-CM

## 2015-12-06 NOTE — Progress Notes (Signed)
Pt came in today for a BP check and weight check per [facility].

## 2015-12-11 ENCOUNTER — Other Ambulatory Visit: Payer: Self-pay | Admitting: Family Medicine

## 2015-12-16 ENCOUNTER — Other Ambulatory Visit: Payer: Self-pay | Admitting: *Deleted

## 2015-12-16 MED ORDER — LISINOPRIL 20 MG PO TABS: 20.0000 mg | ORAL_TABLET | Freq: Every day | ORAL | 6 refills | Status: DC

## 2015-12-20 ENCOUNTER — Ambulatory Visit (INDEPENDENT_AMBULATORY_CARE_PROVIDER_SITE_OTHER): Payer: Medicare Other | Admitting: Cardiovascular Disease

## 2015-12-20 ENCOUNTER — Encounter: Payer: Self-pay | Admitting: Cardiovascular Disease

## 2015-12-20 VITALS — BP 154/82 | HR 61 | Ht 62.0 in | Wt 112.6 lb

## 2015-12-20 DIAGNOSIS — E785 Hyperlipidemia, unspecified: Secondary | ICD-10-CM

## 2015-12-20 DIAGNOSIS — I10 Essential (primary) hypertension: Secondary | ICD-10-CM | POA: Diagnosis not present

## 2015-12-20 DIAGNOSIS — E876 Hypokalemia: Secondary | ICD-10-CM

## 2015-12-20 DIAGNOSIS — K2270 Barrett's esophagus without dysplasia: Secondary | ICD-10-CM

## 2015-12-20 DIAGNOSIS — K52832 Lymphocytic colitis: Secondary | ICD-10-CM | POA: Insufficient documentation

## 2015-12-20 LAB — BASIC METABOLIC PANEL
BUN: 4 mg/dL — ABNORMAL LOW (ref 7–25)
CALCIUM: 9 mg/dL (ref 8.6–10.4)
CO2: 29 mmol/L (ref 20–31)
CREATININE: 0.81 mg/dL (ref 0.60–0.88)
Chloride: 104 mmol/L (ref 98–110)
GLUCOSE: 90 mg/dL (ref 65–99)
Potassium: 4.5 mmol/L (ref 3.5–5.3)
SODIUM: 142 mmol/L (ref 135–146)

## 2015-12-20 NOTE — Patient Instructions (Signed)
Your physician recommends that you return for lab work TODAY.  Dr Sallyanne Kuster recommends that you follow-up with him as needed.

## 2015-12-20 NOTE — Progress Notes (Signed)
Cardiology Office Note    Date:  12/20/2015   ID:  Rachel Walton, DOB Sep 09, 1929, MRN [id number]  PCP:  Redge Gainer, MD  Cardiologist:   Sanda Klein, MD   No chief complaint on file.   History of Present Illness:  Rachel Walton is a 82 y.o. female recently hospitalized with atypical chest discomfort in the setting of severe hypokalemia due to persistent diarrhea. ECG changes are probably related to hypokalemia. Cardiac workup with echocardiography and stress perfusion imaging showed normal findings. She does not have chest discomfort anymore. She was prescribed Pepto-Bismol for lymphocytic colitis but noticed that after several doses she experienced painful swallowing and stopped taking it. She has Barrett's esophagus and was warned by Dr. Delfin Edis not to use aspirin in the past. She continues to have problems with intermittent diarrhea, not quite as severe. She is scheduled to see Dr. Zenovia Jarred on September 8.  Her blood pressure is mildly elevated in the office, but at home her typical blood pressure readings are around 130/60 with occasional values as low was 115/55. She denies pounds of dizziness, syncope, palpitations, dyspnea, angina, leg edema, claudication or focal neurological complaints.    Past Medical History:  Diagnosis Date  . Acute gastric ulcer   . Allergy   . Anxiety   . Atrophic vaginitis   . Barrett's esophagus   . Basal cell carcinoma of nose     under nose  . Depression   . Diverticulosis   . Diverticulosis   . Esophageal stricture   . Esophageal ulcer   . Fatty liver   . GERD (gastroesophageal reflux disease)   . Heart murmur   . Hiatal hernia   . Hx of adenomatous colonic polyps   . Hyperlipidemia   . Hypertension   . IBS (irritable bowel syndrome)   . Kyphosis   . Lymphocytic colitis   . Macular degeneration    "?wet or dry; ? both eyes"  . 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 Osteopenia of the elderly   . Pneumonia X 1   . Postmenopausal   . Recurrent UTI   . 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 Tremor   . Vitamin D deficiency     Past Surgical History:  Procedure Laterality Date  . ABDOMINAL HYSTERECTOMY  1980  . APPENDECTOMY    . BACK SURGERY    . BREAST CYST EXCISION Left   . CATARACT EXTRACTION W/ INTRAOCULAR LENS  IMPLANT, BILATERAL Bilateral   . COLONOSCOPY    . ESOPHAGOGASTRODUODENOSCOPY (EGD) WITH ESOPHAGEAL DILATION  X 1  . EYE SURGERY    . LUMBAR DISC SURGERY     "fragmented disc"  . POLYPECTOMY    . SQUAMOUS CELL CARCINOMA EXCISION     nose tip    Current Medications: Outpatient Medications Prior to Visit  Medication Sig Dispense Refill  . Cholecalciferol (VITAMIN D3) 2000 UNITS TABS Take 1 tablet by mouth 2 (two) times a week. As needed    . clonazePAM (KLONOPIN) 0.5 MG tablet Take 1 tablet (0.5 mg total) by mouth 2 (two) times daily as needed (stress). 60 tablet 2  . escitalopram (LEXAPRO) 10 MG tablet TAKE ONE-HALF TABLET BY MOUTH EVERY DAY 15 tablet 1  . escitalopram (LEXAPRO) 5 MG tablet Take 1 tablet (5 mg total) by mouth daily. 30 tablet 2  . fish oil-omega-3 fatty acids 1000 MG capsule Take 2 g by mouth daily.    Marland Kitchen  fluticasone (FLONASE) 50 MCG/ACT nasal spray Place 1 spray into both nostrils daily as needed for rhinitis.     Marland Kitchen lisinopril (PRINIVIL,ZESTRIL) 20 MG tablet Take 1 tablet (20 mg total) by mouth daily. 30 tablet 6  . Multiple Vitamins-Minerals (ICAPS AREDS FORMULA PO) Take 1 tablet by mouth 2 (two) times daily before a meal.     . potassium chloride (K-DUR) 10 MEQ tablet TAKE ONE (1) TABLET EACH DAY 30 tablet 5  . rosuvastatin (CRESTOR) 5 MG tablet Take 1 tablet (5 mg total) by mouth at bedtime. (Patient taking differently: Take 5 mg by mouth 2 (two) times a week. ) 30 tablet 2  . Bismuth Subsalicylate 99991111 MG TABS Take 3 tablets (786 mg total) by mouth 3 (three) times daily. (Patient not taking: Reported on 12/20/2015) 270 each 1  . budesonide (ENTOCORT EC) 3  MG 24 hr capsule Take 2 tablets daily x 2 months, then decrease to once daily dosing. (Patient not taking: Reported on 12/20/2015) 60 capsule 2   Facility-Administered Medications Prior to Visit  Medication Dose Route Frequency Provider Last Rate Last Dose  . aspirin tablet 325 mg  325 mg Oral Daily Fransisca Kaufmann Dettinger, MD   325 mg at 10/09/15 1655     Allergies:   Alendronate sodium; Amoxicillin; Cephalexin; Prednisone; Sudafed [pseudoephedrine hcl]; Sulfa antibiotics; Toprol xl [metoprolol succinate]; and Trimethoprim   Social History   Social History  . Marital status: Married    Spouse name: N/A  . Number of children: 3  . Years of education: N/A   Occupational History  . Retired    Social History Main Topics  . Smoking status: Never Smoker  . Smokeless tobacco: Never Used  . Alcohol use No  . Drug use: No  . Sexual activity: Yes   Other Topics Concern  . None   Social History Narrative  . None     Family History:  The patient's family history includes Brain cancer in her son; Breast cancer in her other and sister; Cancer in her sister; Colon cancer in her brother; Diabetes in her maternal uncle, son, and son; Heart disease in her brother and father; Hypertension in her sister, sister, sister, sister, sister, sister, and sister; Stroke in her mother and sister; Thyroid cancer in her other.   ROS:   Please see the history of present illness.    ROS All other systems reviewed and are negative.   PHYSICAL EXAM:   VS:  BP (!) 154/82   Pulse 61   Ht 5\' 2"  (1.575 m)   Wt 112 lb 9.6 oz (51.1 kg)   BMI 20.59 kg/m    GEN: Well nourished, well developed, in no acute distress  HEENT: normal  Neck: no JVD, carotid bruits, or masses Cardiac: RRR; no murmurs, rubs, or gallops,no edema  Respiratory:  clear to auscultation bilaterally, normal work of breathing GI: soft, nontender, nondistended, + BS MS: no deformity or atrophy  Skin: warm and dry, no rash Neuro:  Alert and  Oriented x 3, Strength and sensation are intact Psych: euthymic mood, full affect  Wt Readings from Last 3 Encounters:  12/20/15 112 lb 9.6 oz (51.1 kg)  12/06/15 111 lb 4 oz (50.5 kg)  11/07/15 111 lb (50.3 kg)      Studies/Labs Reviewed:   EKG:  EKG is not ordered today.   Recent Labs: 01/25/2015: TSH 2.140 10/09/2015: Hemoglobin 11.8; Magnesium 2.1 10/14/2015: ALT 13; BUN 10; Creatinine, Ser 0.69; Platelets 240; Potassium 4.1;  Sodium 143   Lipid Panel    Component Value Date/Time   CHOL 270 (H) 09/30/2015 1544   TRIG 124 09/30/2015 1544   TRIG 101 06/05/2015 1115   HDL 100 09/30/2015 1544   HDL 101 06/05/2015 1115   CHOLHDL 2.7 09/30/2015 1544   CHOLHDL 3.0 11/21/2012 1632   VLDL 51 (H) 11/21/2012 1632   LDLCALC 145 (H) 09/30/2015 1544   LDLCALC 147 (H) 12/11/2013 0908     ASSESSMENT:    1. Hypokalemia   2. Essential hypertension   3. Hyperlipemia   4. Lymphocytic colitis   5. Barrett's esophagus without dysplasia      PLAN:  In order of problems listed above:  1. HTN: Usually well-controlled, episodic elevation in the doctor's office. No changes made to her medication 2. Low K: Secondary to diarrhea. Check labs to make sure she is receiving the right amount of potassium supplements since she is also taking an ACE inhibitor. 3. HLP: In the absence of established coronary or vascular problems, LDL less than 130 is acceptable. She is taking a statin. Not clear to me whether the most recent labs performed while on statin therapy. 4. Diarrhea/lymphocytic colitis and gastroesophageal reflux disease/Barrett's esophagus: f/u with Dr. Hilarie Fredrickson. I advised her to call his office and let them know that she did not tolerate treatment with Pepto-Bismol    Medication Adjustments/Labs and Tests Ordered: Current medicines are reviewed at length with the patient today.  Concerns regarding medicines are outlined above.  Medication changes, Labs and Tests ordered today are listed in  the Patient Instructions below. Patient Instructions  Your physician recommends that you return for lab work TODAY.  Dr Sallyanne Kuster recommends that you follow-up with him as needed.    Signed, Sanda Klein, MD  12/20/2015 2:49 PM    [address], [place], [place]  [address] Phone: [phone]; Fax: [phone]

## 2015-12-30 ENCOUNTER — Ambulatory Visit (INDEPENDENT_AMBULATORY_CARE_PROVIDER_SITE_OTHER): Payer: Medicare Other | Admitting: Pharmacist

## 2015-12-30 ENCOUNTER — Encounter: Payer: Self-pay | Admitting: Pharmacist

## 2015-12-30 VITALS — BP 136/68 | HR 62 | Ht 62.0 in | Wt 113.0 lb

## 2015-12-30 DIAGNOSIS — Z0000 Encounter for general adult medical examination without abnormal findings: Secondary | ICD-10-CM | POA: Diagnosis not present

## 2015-12-30 DIAGNOSIS — M8580 Other specified disorders of bone density and structure, unspecified site: Secondary | ICD-10-CM

## 2015-12-30 MED ORDER — CALCIUM CARBONATE-VITAMIN D 600-200 MG-UNIT PO TABS: 1.0000 | ORAL_TABLET | Freq: Every day | ORAL | 0 refills | Status: DC

## 2015-12-30 NOTE — Patient Instructions (Addendum)
  Rachel Walton , Thank you for taking time to come for your Medicare Wellness Visit. I appreciate your ongoing commitment to your health goals. Please review the following plan we discussed and let me know if I can assist you in the future.   These are the goals we discussed:  Call at the end of August to make appointment to get flu vaccine  I have sent order to get bone density scan done in November - you should received a call in the next 1-2 weeks.  Continue to exercise 3 days per week  Increase non-starchy vegetables - carrots, green bean, squash, zucchini, tomatoes, onions, peppers, spinach and other green leafy vegetables, cabbage, lettuce, cucumbers, asparagus, okra (not fried), eggplant Limit sugar and processed foods (cakes, cookies, ice cream, crackers and chips) Increase fresh fruit but limit serving sizes 1/2 cup or about the size of tennis or baseball Limit red meat to no more than 1-2 times per week (serving size about the size of your palm) Choose whole grains / lean proteins - whole wheat bread, quinoa, whole grain rice (1/2 cup), fish, chicken, [place] Avoid sugar and calorie containing beverages - soda, sweet tea and juice.  Choose water or unsweetened tea instead.    This is a list of the screening recommended for you and due dates:  Health Maintenance  Topic Date Due  . Flu Shot  12/10/2015  . DEXA scan (bone density measurement)  03/28/2016  . Tetanus Vaccine  05/11/2016  . Mammogram  08/08/2016  . Shingles Vaccine  Completed  . Pneumonia vaccines  Completed

## 2015-12-30 NOTE — Progress Notes (Signed)
Patient ID: DEMEKA Walton, female   DOB: [birth year], 82 y.o.   MRN: [id number]    Subjective:   Rachel Walton is a 82 y.o. female who presents for an Initial Medicare Annual Wellness Visit.  Rachel Walton is retired.  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  She lives with her husband in [place], [place].   Review of Systems  Review of Systems  Constitutional: Negative.   HENT: Positive for hearing loss (sees Dr Wilburn Cornelia for this).   Eyes: Positive for blurred vision (has plans to see Dr Sabra Heck soon for evlauation).  Respiratory: Negative.   Cardiovascular: Negative.   Gastrointestinal: Positive for diarrhea (improving since last visit with Dr Hilarie Fredrickson).  Genitourinary: Negative.   Musculoskeletal: Negative.   Skin: Negative.   Neurological: Positive for tremors.  Endo/Heme/Allergies: Negative.   Psychiatric/Behavioral: Negative.    Current Medications (verified) Outpatient Encounter Prescriptions as of 12/30/2015  Medication Sig  . clonazePAM (KLONOPIN) 0.5 MG tablet Take 1 tablet (0.5 mg total) by mouth 2 (two) times daily as needed (stress).  . fluticasone (FLONASE) 50 MCG/ACT nasal spray Place 1 spray into both nostrils daily as needed for rhinitis.   Marland Kitchen lisinopril (PRINIVIL,ZESTRIL) 20 MG tablet Take 1 tablet (20 mg total) by mouth daily.  . Multiple Vitamins-Minerals (ICAPS AREDS FORMULA PO) Take 1 tablet by mouth 2 (two) times daily before a meal.   . potassium chloride (K-DUR) 10 MEQ tablet TAKE ONE (1) TABLET EACH DAY  . rosuvastatin (CRESTOR) 5 MG tablet Take 1 tablet (5 mg total) by mouth at bedtime. (Patient taking differently: Take 5 mg by mouth 2 (two) times a week. )  . Vitamin D, Cholecalciferol, 1000 units TABS Take 2,000 Units by mouth 2 (two) times a week.  . [DISCONTINUED] Cholecalciferol (VITAMIN D3) 2000 UNITS TABS Take 1 tablet by mouth 2 (two) times a week. As needed  . Calcium Carbonate-Vitamin D (CALCIUM 600+D) 600-200 MG-UNIT TABS  Take 1 tablet by mouth daily. Take with a meal  . fish oil-omega-3 fatty acids 1000 MG capsule Take 2 g by mouth daily.  . [DISCONTINUED] escitalopram (LEXAPRO) 10 MG tablet TAKE ONE-HALF TABLET BY MOUTH EVERY DAY (Patient not taking: Reported on 12/30/2015)  . [DISCONTINUED] escitalopram (LEXAPRO) 5 MG tablet Take 1 tablet (5 mg total) by mouth daily. (Patient not taking: Reported on 12/30/2015)  . [DISCONTINUED] aspirin tablet 325 mg    No facility-administered encounter medications on file as of 12/30/2015.     Allergies (verified) Alendronate sodium; Amoxicillin; Cephalexin; Prednisone; Sudafed [pseudoephedrine hcl]; Sulfa antibiotics; Toprol xl [metoprolol succinate]; and Trimethoprim   History: Past Medical History:  Diagnosis Date  . Acute gastric ulcer   . Allergy   . Anxiety   . Atrophic vaginitis   . Barrett's esophagus   . Basal cell carcinoma of nose     under nose  . Cataract   . Depression   . Diverticulosis   . Diverticulosis   . Esophageal stricture   . Esophageal ulcer   . Fatty liver   . GERD (gastroesophageal reflux disease)   . Heart murmur   . Hiatal hernia   . Hx of adenomatous colonic polyps   . Hyperlipidemia   . Hypertension   . Hypokalemia   . IBS (irritable bowel syndrome)   . Kyphosis   . Lymphocytic colitis   . Macular degeneration    "?wet or dry; ? both eyes"  . 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  Osteopenia of the elderly   . Pneumonia X 1  . Postmenopausal   . Recurrent UTI   . 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 Tremor   . Vitamin D deficiency    Past Surgical History:  Procedure Laterality Date  . ABDOMINAL HYSTERECTOMY  1980  . APPENDECTOMY    . BACK SURGERY    . BREAST CYST EXCISION Left   . CATARACT EXTRACTION W/ INTRAOCULAR LENS  IMPLANT, BILATERAL Bilateral   . COLONOSCOPY    . ESOPHAGOGASTRODUODENOSCOPY (EGD) WITH ESOPHAGEAL DILATION  X 1  . EYE SURGERY    . LUMBAR DISC SURGERY     "fragmented  disc"  . POLYPECTOMY    . SQUAMOUS CELL CARCINOMA EXCISION     nose tip   Family History  Problem Relation Age of Onset  . Stroke Mother   . Heart disease Father     MI  . Heart attack Father   . Cancer Sister     Brain tumor  . Hypertension Sister   . Colon cancer Brother   . Heart disease Brother   . Diabetes Son 8  . Diabetes Maternal Uncle   . Diabetes Son 91  . Hypertension Sister   . Breast cancer Sister   . Hypertension Sister   . Hypertension Sister   . Hypertension Sister   . Stroke Sister   . Hypertension Sister   . Hypertension Sister   . Colon cancer      nephew  . Brain cancer Son     deceased  . Cancer Son     brain  . Thyroid cancer Other     niece  . Breast cancer Other     nieces x 3   . Colon polyps Neg Hx   . Esophageal cancer Neg Hx   . Rectal cancer Neg Hx   . Stomach cancer Neg Hx    Social History   Occupational History  . Retired    Social History Main Topics  . Smoking status: Never Smoker  . Smokeless tobacco: Never Used  . Alcohol use No  . Drug use: No  . Sexual activity: Yes    Do you feel safe at home?  Yes Are there smokers in your home (other than you)? No  Dietary issues and exercise activities: Current Exercise Habits: Structured exercise class, Type of exercise: strength training/weights, Time (Minutes): 30, Frequency (Times/Week): 3, Weekly Exercise (Minutes/Week): 90, Intensity: Moderate  Current Dietary habits:  Patient avoids dairy and sugar as these seem to worsen her loose stool / diarrhea.  She also only eats cooked vegetables and avoids carbonated drinks.   Objective:    Today's Vitals   12/30/15 1229  BP: 136/68  Pulse: 62  Weight: 113 lb (51.3 kg)  Height: 5\' 2"  (1.575 m)  PainSc: 0-No pain   Body mass index is 20.67 kg/m.  Activities of Daily Living In your present state of health, do you have any difficulty performing the following activities: 12/30/2015 10/10/2015  Hearing? Tempie Donning  Vision? Y Y    Difficulty concentrating or making decisions? N Y  Walking or climbing stairs? N N  Dressing or bathing? N N  Doing errands, shopping? N N  Preparing Food and eating ? N -  Using the Toilet? N -  In the past six months, have you accidently leaked urine? N -  Do you have problems with loss of bowel control? N -  Managing your Medications? N -  Managing your Finances? N -  Housekeeping or managing your Housekeeping? N -  Some recent data might be hidden     Cardiac Risk Factors include: advanced age (>69men, >73 women);dyslipidemia;hypertension  Depression Screen PHQ 2/9 Scores 12/30/2015 11/07/2015 10/14/2015 10/14/2015  PHQ - 2 Score 1 1 2  0  PHQ- 9 Score - - 8 -     Fall Risk Fall Risk  12/30/2015 11/07/2015 10/14/2015 09/30/2015 08/19/2015  Falls in the past year? No No No No No  Number falls in past yr: - - - - -  Injury with Fall? - - - - -  Risk for fall due to : - - - - -  Follow up - - - - -    Cognitive Function: MMSE - Mini Mental State Exam 12/30/2015 08/30/2014  Orientation to time 5 5  Orientation to Place 5 5  Registration 3 3  Attention/ Calculation 5 5  Recall 1 3  Language- name 2 objects 2 2  Language- repeat 1 1  Language- follow 3 step command 1 3  Language- read & follow direction 1 1  Write a sentence 1 1  Copy design 1 1  Total score 26 30    Immunizations and Health Maintenance Immunization History  Administered Date(s) Administered  . Influenza Whole 02/08/2010  . Influenza,inj,Quad PF,36+ Mos 03/07/2013, 04/12/2014, 03/29/2015  . Pneumococcal Conjugate-13 05/01/2013  . Pneumococcal Polysaccharide-23 02/08/2001  . Td 05/11/2006  . Zoster 09/09/2006   Health Maintenance Due  Topic Date Due  . INFLUENZA VACCINE  12/10/2015    Patient Care Team: Chipper Herb, MD as PCP - General (Family Medicine) Alphonsa Overall, MD as Consulting Physician (General Surgery) Irine Seal, MD as Attending Physician (Urology) Jerrell Belfast, MD as Consulting  Physician (Otolaryngology) Justice Britain, MD as Consulting Physician (Orthopedic Surgery) Harriett Sine, MD as Consulting Physician (Dermatology) Jerene Bears, MD as Consulting Physician (Gastroenterology) Sanda Klein, MD as Consulting Physician (Cardiology)  Indicate any recent [place] you may have received from other than [facility] providers in the past year (date may be approximate).    Assessment:    Annual Wellness Visit    Screening Tests Health Maintenance  Topic Date Due  . INFLUENZA VACCINE  12/10/2015  . DEXA SCAN  03/28/2016  . TETANUS/TDAP  05/11/2016  . MAMMOGRAM  08/08/2016  . ZOSTAVAX  Completed  . PNA vac Low Risk Adult  Completed        Plan:   During the course of the visit Adonis was educated and counseled about the following appropriate screening and preventive services:   Vaccines to include Pneumoccal, Influenza, Td, Zostavax - vaccines are UTD.  Patient to call at end for August for influenza vaccine appt  Colorectal cancer screening - colonoscopy and FOBT are UTD  Cardiovascular disease screening - EKG and ECHO are both UTD - done 10/2015  Diabetes screening - UTD, last FBG was 90  Bone Denisty / Osteoporosis Screening - UTD but is due 03/2016 - order sent today  Mammogram - UTD  PAP - no longer required / recommended.  Glaucoma screening / Eye Exam - UTD  Nutrition counseling - Recommend continue current diet as loose stools have improved since she stopped eating dairy, carbonated beverages and only eating cooked vegetables.   Advanced Directives - discussed and packet given for patient and for her husband to review  Physical Activity - continue to exercise 3 times per week  Add back caltrate 600mg  +D take 1 tablet  daily.   Patient Instructions (the written plan) were given to the patient.   Cherre Robins, PharmD   12/30/2015

## 2016-01-07 ENCOUNTER — Other Ambulatory Visit: Payer: Self-pay | Admitting: Family Medicine

## 2016-01-07 DIAGNOSIS — E876 Hypokalemia: Secondary | ICD-10-CM

## 2016-01-07 MED ORDER — POTASSIUM CHLORIDE ER 10 MEQ PO TBCR: EXTENDED_RELEASE_TABLET | ORAL | 5 refills | Status: DC

## 2016-01-07 NOTE — Telephone Encounter (Signed)
rx sent to pharmacy

## 2016-01-15 ENCOUNTER — Telehealth: Payer: Self-pay | Admitting: Family Medicine

## 2016-01-16 NOTE — Telephone Encounter (Signed)
Spoke with pt regarding BP No sxs of CP, dizziness or syncope She has follow up with Dr [name] 02/05/2016 Continued loose bms Has appt with GI 01/17/2016 She will try to increase fluids If sxs persist or worsen, she will come in sooner

## 2016-01-17 ENCOUNTER — Encounter: Payer: Self-pay | Admitting: Internal Medicine

## 2016-01-17 ENCOUNTER — Ambulatory Visit (INDEPENDENT_AMBULATORY_CARE_PROVIDER_SITE_OTHER): Payer: Medicare Other | Admitting: Internal Medicine

## 2016-01-17 VITALS — BP 122/68 | HR 84 | Ht 62.0 in | Wt 113.2 lb

## 2016-01-17 DIAGNOSIS — K648 Other hemorrhoids: Secondary | ICD-10-CM | POA: Diagnosis not present

## 2016-01-17 DIAGNOSIS — K52832 Lymphocytic colitis: Secondary | ICD-10-CM | POA: Diagnosis not present

## 2016-01-17 MED ORDER — BUDESONIDE 3 MG PO CPEP: 3.0000 mg | ORAL_CAPSULE | Freq: Every day | ORAL | 2 refills | Status: DC

## 2016-01-17 MED ORDER — AMBULATORY NON FORMULARY MEDICATION
2 refills | Status: DC
Start: 2016-01-17 — End: 2016-06-09

## 2016-01-17 NOTE — Progress Notes (Signed)
Subjective:    Patient ID: Rachel Walton, female    DOB: 16-Mar-1930, 82 y.o.   MRN: [id number]  HPI Rachel Walton is an 82 year old female with a history of lymphocytic colitis, long-standing IBS, GERD with Schatzki's ring, remote colon polyps and diverticulosis who is here for follow-up. She was last seen on 11/06/2015 where we discussed her lymphocytic colitis. I recommended she begin Entocort therapy at 6 mg a day. She tried this and felt like her lips were tingling and numb. She stopped this and felt like symptoms Better though her diarrhea failed to change. She now has the ongoing lip tingling and numbness since she no longer feels this is related to budesonide therapy. We tried bismuth 3 times a day for microscopic colitis but she stated she felt a burning in her chest and esophagus and read that this medication contain aspirin. It also made her stools black which bothered her.  Today she states the diarrhea is slightly better in that it is no longer nocturnal though she still has issues with frequent loose stools often after eating. She denies abdominal pain. Denies nausea and vomiting. No fever or chills. She's had intermittent flares of her hemorrhoids which feels swollen and sore. No rectal bleeding.  Review of Systems As per history of present illness, otherwise negative  Current Medications, Allergies, Past Medical History, Past Surgical History, Family History and Social History were reviewed in [employer] record.     Objective:   Physical Exam BP 122/68   Pulse 84   Ht 5\' 2"  (1.575 m)   Wt 113 lb 4 oz (51.4 kg)   BMI 20.71 kg/m  Constitutional: Well-developed and well-nourished. No distress. HEENT: Normocephalic and atraumatic. Oropharynx is clear and moist. No oropharyngeal exudate. Conjunctivae are normal.  No scleral icterus. Neck: Neck supple. Trachea midline. Cardiovascular: Normal rate, regular rhythm and intact distal pulses. No  M/R/G Pulmonary/chest: Effort normal and breath sounds normal. No wheezing, rales or rhonchi. Abdominal: Soft, nontender, nondistended. Bowel sounds active throughout. There are no masses palpable. No hepatosplenomegaly. Extremities: no clubbing, cyanosis, or edema Lymphadenopathy: No cervical adenopathy noted. Neurological: Alert and oriented to person place and time. Skin: Skin is warm and dry. No rashes noted. Psychiatric: Normal mood and affect. Behavior is normal.  Colonoscopy 08/15/2015 -- sigmoid diverticuli, internal hemorrhoids, random biopsies showing microscopic colitis     Assessment & Plan:   82 year old female with a history of lymphocytic colitis, long-standing IBS, GERD with Schatzki's ring, remote colon polyps and diverticulosis who is here for follow-up  1. Lymphocytic colitis -- we have yet to get her microscopic colitis treated as she has had presumed intolerance to the therapies tried to this point. She continues to have loose stools with urgency and at times diarrhea. I'm not given that she had a reaction to budesonide and given the fact that she did not tolerate Pepto-Bismol at high dose, I'm going to have her retry budesonide at low dose. Begin budesonide/Entocort 3 mg daily. I asked her to try this for 8-12 weeks. If she again does not tolerated the next option will be Lialda 2.4 g daily. Lialda now has a generic equivalent and would likely be affordable for her, should this medication become necessary.  2. Internal hemorrhoids -- seen at colonoscopy in April, symptomatic, and related to #1.  She's been using topical therapy and I'm going to try her on hydrocortisone suppository 1% because Medicare will not cover the 25 mg dose. She will use  this daily at bedtime for several days and then on an as-needed basis. Hopefully once her diarrhea is under better control her hemorrhoids will be less symptomatic. Banding remains an option should they remain symptomatic despite medical  therapy and hopeful improvement in microscopic colitis.  25 minutes spent with the patient today. Greater than 50% was spent in counseling and coordination of care with the patient

## 2016-01-17 NOTE — Patient Instructions (Addendum)
Discontinue Pepto Bismol.  We have sent the following medications to your pharmacy for you to pick up at your convenience: Budesonide 3 mg once daily Hydrocortisone suppositories 1%-Insert into rectum every night as needed for hemorrhoids  Follow up with Dr Hilarie Fredrickson on Friday, 04/17/16 @ 1:30 pm.  If you are age 80 or older, your body mass index should be between 23-30. Your Body mass index is 20.71 kg/m. If this is out of the aforementioned range listed, please consider follow up with your Primary Care Provider.  If you are age 54 or younger, your body mass index should be between 19-25. Your Body mass index is 20.71 kg/m. If this is out of the aformentioned range listed, please consider follow up with your Primary Care Provider.

## 2016-01-20 ENCOUNTER — Telehealth: Payer: Self-pay | Admitting: Internal Medicine

## 2016-01-20 NOTE — Telephone Encounter (Signed)
Devina can only find hydrocortisone suppositories 25 mg and the 1% is in reference to the cream. I informed her that she can fill the 25 mg suppositories because it says in Dr. Quentin Mulling office note suppository.

## 2016-01-23 ENCOUNTER — Telehealth: Payer: Self-pay | Admitting: Internal Medicine

## 2016-01-23 MED ORDER — MESALAMINE 1.2 G PO TBEC: 2.4000 g | DELAYED_RELEASE_TABLET | Freq: Every day | ORAL | 0 refills | Status: DC

## 2016-01-23 NOTE — Telephone Encounter (Signed)
Pt states she cannot take the budesonide, makes her feel shaky inside and blurry vision. Per OV note pt was to try Lialda 2.4 daily. Script for a small amount of lialda sent to pharmacy.

## 2016-01-25 ENCOUNTER — Ambulatory Visit (INDEPENDENT_AMBULATORY_CARE_PROVIDER_SITE_OTHER): Payer: Medicare Other | Admitting: Pediatrics

## 2016-01-25 ENCOUNTER — Encounter: Payer: Self-pay | Admitting: Pediatrics

## 2016-01-25 VITALS — BP 139/58 | HR 57 | Temp 97.0°F | Ht 62.0 in | Wt 111.0 lb

## 2016-01-25 DIAGNOSIS — R0789 Other chest pain: Secondary | ICD-10-CM

## 2016-01-25 NOTE — Progress Notes (Signed)
  Subjective:   Patient ID: Rachel Walton, female    DOB: [birth year], 82 y.o.   MRN: [id number] CC: Fatigue (sudden weakness that comes on all at once); Oral Swelling (feels that upper lip has been swelling for "a while"); and Chest Pain (episode of stabbing pain in left side of chest earlier today. It has resolved. Normal exam with cardiology 2 weeks ago)  HPI: Rachel Walton is a 82 y.o. female presenting for Fatigue (sudden weakness that comes on all at once); Oral Swelling (feels that upper lip has been swelling for "a while"); and Chest Pain (episode of stabbing pain in left side of chest earlier today. It has resolved. Normal exam with cardiology 2 weeks ago)  Recently given budesonide for colitis, caused symptoms though she doesn't remember what they were Was then given a lower mg dose of budesonide She didn't take them Given mesalamine yesterday hasnt started yet Continues to have some loose bowel movments, no worse than usual Had pain under L breast this morning when she was sitting at the computer Her son called and she picked up the phone with her L arm and then had the sharp pain under her breast that lasted more than a minute. She had to hang up with her son and went to lie down, was feeling better within a coupl eof minutes Had similar pain a few months ago though then also was having L arm tingling/pain Had ECHO, stress perfusion imaging with normal findings at that time She does not have any pain now Feels strong in all extremities  Relevant past medical, surgical, family and social history reviewed. Allergies and medications reviewed and updated. History  Smoking Status  . Never Smoker  Smokeless Tobacco  . Never Used   ROS: Per HPI   Objective:    BP (!) 139/58 (BP Location: Left Arm, Patient Position: Sitting, Cuff Size: Normal)   Pulse (!) 57   Temp 97 F (36.1 C) (Oral)   Ht 5\' 2"  (1.575 m)   Wt 111 lb (50.3 kg)   BMI 20.30 kg/m   Wt Readings from Last 3  Encounters:  01/25/16 111 lb (50.3 kg)  01/17/16 113 lb 4 oz (51.4 kg)  12/30/15 113 lb (51.3 kg)    Gen: NAD, alert, cooperative with exam, NCAT EYES: EOMI, no conjunctival injection, or no icterus ENT:  TMs pearly gray b/l, OP without erythema, normal appearing gums LYMPH: no cervical LAD CV: NRRR, normal S1/S2, no murmur, distal pulses 2+ b/l Resp: CTABL, no wheezes, normal WOB Abd: +BS, soft, NTND. no guarding or organomegaly Ext: No edema, warm Neuro: Alert and oriented, strength equal b/l UE and LE, coordination grossly normal MSK: sore with palpation along L lower sternal border Breast: normal L breast exam  Assessment & Plan:  Rachel Walton was seen today for fatigue and atypical chest pain.  Diagnoses and all orders for this visit:  Chest wall pain Reproducible pain on exam No pain with exertion Similar to prior pain, recent full work up with low risk studies Normal ECHO Discussed return precautions  Colitis Pt to start mesalamine for diarrhea per GI, just picked up Rx, hasnt started yet  Follow up plan: Has appt 9/27 with PCP for follow up Assunta Found, MD [facility]

## 2016-02-05 ENCOUNTER — Ambulatory Visit (INDEPENDENT_AMBULATORY_CARE_PROVIDER_SITE_OTHER): Payer: Medicare Other | Admitting: Family Medicine

## 2016-02-05 ENCOUNTER — Encounter: Payer: Self-pay | Admitting: Family Medicine

## 2016-02-05 VITALS — BP 128/62 | HR 66 | Temp 97.7°F | Ht 62.0 in | Wt 112.0 lb

## 2016-02-05 DIAGNOSIS — E559 Vitamin D deficiency, unspecified: Secondary | ICD-10-CM

## 2016-02-05 DIAGNOSIS — E785 Hyperlipidemia, unspecified: Secondary | ICD-10-CM | POA: Diagnosis not present

## 2016-02-05 DIAGNOSIS — R5383 Other fatigue: Secondary | ICD-10-CM | POA: Diagnosis not present

## 2016-02-05 DIAGNOSIS — R531 Weakness: Secondary | ICD-10-CM | POA: Diagnosis not present

## 2016-02-05 DIAGNOSIS — I10 Essential (primary) hypertension: Secondary | ICD-10-CM

## 2016-02-05 DIAGNOSIS — K52832 Lymphocytic colitis: Secondary | ICD-10-CM | POA: Diagnosis not present

## 2016-02-05 DIAGNOSIS — C44311 Basal cell carcinoma of skin of nose: Secondary | ICD-10-CM | POA: Diagnosis not present

## 2016-02-05 MED ORDER — CLONAZEPAM 0.5 MG PO TABS: 0.5000 mg | ORAL_TABLET | Freq: Two times a day (BID) | ORAL | 2 refills | Status: DC | PRN

## 2016-02-05 MED ORDER — ESCITALOPRAM OXALATE 10 MG PO TABS: 5.0000 mg | ORAL_TABLET | Freq: Every day | ORAL | 1 refills | Status: DC

## 2016-02-05 NOTE — Patient Instructions (Addendum)
Medicare Annual Wellness Visit  [facility] and the medical providers at [facility] strive to bring you the best medical care.  In doing so we not only want to address your current medical conditions and concerns but also to detect new conditions early and prevent illness, disease and health-related problems.    Medicare offers a yearly Wellness Visit which allows our clinical staff to assess your need for preventative services including immunizations, lifestyle education, counseling to decrease risk of preventable diseases and screening for fall risk and other medical concerns.    This visit is provided free of charge (no copay) for all Medicare recipients. The clinical pharmacists at [facility] have begun to conduct these Wellness Visits which will also include a thorough review of all your medications.    As you primary medical provider recommend that you make an appointment for your Annual Wellness Visit if you have not done so already this year.  You may set up this appointment before you leave today or you may call back [phone]) and schedule an appointment.  Please make sure when you call that you mention that you are scheduling your Annual Wellness Visit with the clinical pharmacist so that the appointment may be made for the proper length of time.     Continue current medications. Continue good therapeutic lifestyle changes which include good diet and exercise. Fall precautions discussed with patient. If an FOBT was given today- please return it to our front desk. If you are over 74 years old - you may need Prevnar 32 or the adult Pneumonia vaccine.  **Flu shots are available--- please call and schedule a FLU-CLINIC appointment**  After your visit with [place] today you will receive a survey in the mail or online from [employer] regarding your care with [place]. Please take a moment to fill this out. Your feedback is very  important to [place] as you can help [place] better understand your patient needs as well as improve your experience and satisfaction. WE CARE ABOUT YOU!!!   Patient should continue to follow-up with her cardiologist and her gastroenterologist and follow their recommendations We will call the lab work results as soon as they become available She should come back sometime in October for her flu shot She should take Tylenol if needed for her back pain

## 2016-02-05 NOTE — Progress Notes (Signed)
Subjective:    Patient ID: Rachel Walton, female    DOB: 1929/12/26, 82 y.o.   MRN: [id number]  HPI Pt here for follow up and management of chronic medical problems which includes hyperlipidemia and hypertension. She is taking medications regularly.The patient is followed regularly by the cardiologist and the gastroenterologist. She had a hospital admission earlier this year because of her potassium being low and this is secondary to her frequent loose bowel movements. She has been diagnosed with lymphocytic colitis by her gastroenterologist. They still have not been able to get the right treatment to get this under control and all efforts are being made to find the treatment that will work best and the gastroenterologist has plans for how to get a better handle on this. She is still having 3 or 4 loose bowel movements a day and sometimes they're are uncontrollable area this is not every day. She comes in today for follow-up of her elevated cholesterol and her blood pressure. Her blood pressure is good today. She is complaining of some back pain and weakness and fatigue. She is asking about Lexapro. She is currently taking 5 mg daily. She does bring in outside blood pressures for review that are taken in the morning and the evening through August and the majority of these are good with a few exceptions being in the 140s and 782N for the systolic number. She denies any chest pain or shortness of breath but just complains of weakness. She has not seen any blood in the stools. And some days they may be fewer bowel movements than others. She is passing her water without problems. We've recommended that she continue to take her Lexapro 5 mg.    Patient Active Problem List   Diagnosis Date Noted  . Lymphocytic colitis 12/20/2015  . Barrett's esophagus 12/20/2015  . 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 Depression 11/07/2015  . Pain in the chest   . Chest pain 10/09/2015  . Hypokalemia 10/09/2015  . Anemia  10/09/2015  . Esophageal stricture 12/11/2013  . Allergic rhinitis due to pollen 12/11/2013  . Essential hypertension 12/11/2013  . Generalized anxiety disorder 08/21/2013  . Gall stones 06/15/2013  . Hyperlipemia 11/21/2012  . Tremor   . Postmenopausal   . Colon polyp   . Osteopenia of the elderly   . Kyphosis   . Recurrent UTI   . Atrophic vaginitis   . Diverticulosis    Outpatient Encounter Prescriptions as of 02/05/2016  Medication Sig  . AMBULATORY NON FORMULARY MEDICATION Medication Name: Hydrocortisone 1% rectal suppositories-insert in rectum every night as needed for hemorrhoids  . ANUCORT-HC 25 MG suppository insert ONE suppository rectally every night as needed FOR hemorrhoids  . B Complex-C-E-Zn (STRESS B-COMPLEX/VIT C/ZINC) TABS Take by mouth.  . Calcium Carbonate-Vitamin D (CALCIUM 600+D) 600-200 MG-UNIT TABS Take 1 tablet by mouth daily. Take with a meal  . clonazePAM (KLONOPIN) 0.5 MG tablet Take 1 tablet (0.5 mg total) by mouth 2 (two) times daily as needed (stress).  Marland Kitchen escitalopram (LEXAPRO) 10 MG tablet Take 5 mg by mouth daily.  . fish oil-omega-3 fatty acids 1000 MG capsule Take 2 g by mouth daily.  . fluticasone (FLONASE) 50 MCG/ACT nasal spray Place 1 spray into both nostrils daily as needed for rhinitis.   Marland Kitchen lisinopril (PRINIVIL,ZESTRIL) 20 MG tablet Take 1 tablet (20 mg total) by mouth daily.  . mesalamine (LIALDA) 1.2 g EC tablet Take 2 tablets (2.4 g total) by mouth daily with breakfast.  . Multiple  Vitamins-Minerals (ICAPS AREDS FORMULA PO) Take 1 tablet by mouth 2 (two) times daily before a meal.   . potassium chloride (K-DUR) 10 MEQ tablet TAKE ONE (1) TABLET EACH DAY  . rosuvastatin (CRESTOR) 5 MG tablet Take 1 tablet (5 mg total) by mouth at bedtime. (Patient taking differently: Take 5 mg by mouth 2 (two) times a week. )  . Vitamin D, Cholecalciferol, 1000 units TABS Take 2,000 Units by mouth 2 (two) times a week.   No facility-administered encounter  medications on file as of 02/05/2016.       Review of Systems  Constitutional: Positive for fatigue.  HENT: Negative.   Eyes: Negative.   Respiratory: Negative.   Cardiovascular: Negative.   Gastrointestinal: Negative.   Endocrine: Negative.   Genitourinary: Negative.   Musculoskeletal: Positive for back pain (between shoulder blades).  Skin: Negative.   Allergic/Immunologic: Negative.   Neurological: Positive for weakness.  Hematological: Negative.   Psychiatric/Behavioral: Negative.        Objective:   Physical Exam  Constitutional: She is oriented to person, place, and time. No distress.  Pleasant and alert  HENT:  Head: Normocephalic and atraumatic.  Left Ear: External ear normal.  Nose: Nose normal.  Mouth/Throat: Oropharynx is clear and moist. No oropharyngeal exudate.  The myringotomy tube was embedded in some wax in the external ear canal on the right and this was removed with a curet. The ear canals were otherwise clear.  Eyes: Conjunctivae and EOM are normal. Pupils are equal, round, and reactive to light. Right eye exhibits no discharge. Left eye exhibits no discharge. No scleral icterus.  Neck: Normal range of motion. Neck supple. No thyromegaly present.  No thyromegaly bruits or anterior cervical adenopathy  Cardiovascular: Normal rate, regular rhythm, normal heart sounds and intact distal pulses.   No murmur heard. Heart is regular at 72/m  Pulmonary/Chest: Effort normal and breath sounds normal. No respiratory distress. She has no wheezes. She has no rales.  Clear anteriorly and posteriorly  Abdominal: Soft. Bowel sounds are normal. She exhibits no mass. There is tenderness. There is no rebound and no guarding.  There was some tenderness in the left lower quadrant and there is no inguinal adenopathy with good inguinal pulses.  Musculoskeletal: Normal range of motion. She exhibits no edema.  Lymphadenopathy:    She has no cervical adenopathy.  Neurological:  She is alert and oriented to person, place, and time. She has normal reflexes. No cranial nerve deficit.  Skin: Skin is warm and dry. No rash noted.  Psychiatric: She has a normal mood and affect. Her behavior is normal. Judgment and thought content normal.  Nursing note and vitals reviewed.  BP 128/62 (BP Location: Left Arm)   Pulse 66   Temp 97.7 F (36.5 C) (Oral)   Ht 5' 2"  (1.575 m)   Wt 112 lb (50.8 kg)   BMI 20.49 kg/m         Assessment & Plan:  1. Vitamin D deficiency -Continue current treatment pending results of lab work - CBC with Differential/Platelet - VITAMIN D 25 Hydroxy (Vit-D Deficiency, Fractures)  2. Hyperlipidemia -Continue current treatment pending results of lab work - CBC with Differential/Platelet - Lipid panel  3. Essential hypertension -The patient brings in outside blood pressures for review today and the majority of those were in the 120s over the upper 50s to 60s. We told her she should stay on her current blood pressure treatment. - BMP8+EGFR - CBC with Differential/Platelet - Hepatic  function panel  4. Weak -The patient has had hypokalemia in the past and we will double check her potassium with her BMP today. - CBC with Differential/Platelet - Vitamin B12 - Thyroid Panel With TSH  5. Other fatigue - CBC with Differential/Platelet - Vitamin B12 - Thyroid Panel With TSH  6. Basal cell carcinoma of nose -She has a history of having had this. No lesions today.  7. Lymphocytic colitis -Continue to follow up with gastroenterologist and his recommendations  Meds ordered this encounter  Medications  . clonazePAM (KLONOPIN) 0.5 MG tablet    Sig: Take 1 tablet (0.5 mg total) by mouth 2 (two) times daily as needed (stress).    Dispense:  60 tablet    Refill:  2  . escitalopram (LEXAPRO) 10 MG tablet    Sig: Take 0.5 tablets (5 mg total) by mouth daily.    Dispense:  45 tablet    Refill:  1   Patient Instructions                        Medicare Annual Wellness Visit  [facility] and the medical providers at [facility] strive to bring you the best medical care.  In doing so we not only want to address your current medical conditions and concerns but also to detect new conditions early and prevent illness, disease and health-related problems.    Medicare offers a yearly Wellness Visit which allows our clinical staff to assess your need for preventative services including immunizations, lifestyle education, counseling to decrease risk of preventable diseases and screening for fall risk and other medical concerns.    This visit is provided free of charge (no copay) for all Medicare recipients. The clinical pharmacists at [facility] have begun to conduct these Wellness Visits which will also include a thorough review of all your medications.    As you primary medical provider recommend that you make an appointment for your Annual Wellness Visit if you have not done so already this year.  You may set up this appointment before you leave today or you may call back ([phone]) and schedule an appointment.  Please make sure when you call that you mention that you are scheduling your Annual Wellness Visit with the clinical pharmacist so that the appointment may be made for the proper length of time.     Continue current medications. Continue good therapeutic lifestyle changes which include good diet and exercise. Fall precautions discussed with patient. If an FOBT was given today- please return it to our front desk. If you are over 60 years old - you may need Prevnar 60 or the adult Pneumonia vaccine.  **Flu shots are available--- please call and schedule a FLU-CLINIC appointment**  After your visit with [place] today you will receive a survey in the mail or online from [employer] regarding your care with [place]. Please take a moment to fill this out. Your feedback is very important to [place] as you can  help [place] better understand your patient needs as well as improve your experience and satisfaction. WE CARE ABOUT YOU!!!   Patient should continue to follow-up with her cardiologist and her gastroenterologist and follow their recommendations We will call the lab work results as soon as they become available She should come back sometime in October for her flu shot She should take Tylenol if needed for her back pain    Arrie Senate MD

## 2016-02-06 LAB — HEPATIC FUNCTION PANEL
ALBUMIN: 4.2 g/dL (ref 3.5–4.7)
ALK PHOS: 57 IU/L (ref 39–117)
ALT: 17 IU/L (ref 0–32)
AST: 27 IU/L (ref 0–40)
BILIRUBIN TOTAL: 0.4 mg/dL (ref 0.0–1.2)
Bilirubin, Direct: 0.11 mg/dL (ref 0.00–0.40)
TOTAL PROTEIN: 6.6 g/dL (ref 6.0–8.5)

## 2016-02-06 LAB — BMP8+EGFR
BUN/Creatinine Ratio: 12 (ref 12–28)
BUN: 8 mg/dL (ref 8–27)
CO2: 24 mmol/L (ref 18–29)
Calcium: 9.2 mg/dL (ref 8.7–10.3)
Chloride: 98 mmol/L (ref 96–106)
Creatinine, Ser: 0.65 mg/dL (ref 0.57–1.00)
GFR calc Af Amer: 94 mL/min/{1.73_m2} (ref >59)
GFR calc non Af Amer: 82 mL/min/{1.73_m2} (ref >59)
Glucose: 83 mg/dL (ref 65–99)
Potassium: 4.2 mmol/L (ref 3.5–5.2)
Sodium: 141 mmol/L (ref 134–144)

## 2016-02-06 LAB — CBC WITH DIFFERENTIAL/PLATELET
BASOS ABS: 0.1 10*3/uL (ref 0.0–0.2)
BASOS: 1 %
EOS (ABSOLUTE): 0.1 10*3/uL (ref 0.0–0.4)
Eos: 2 %
Hematocrit: 40.9 % (ref 34.0–46.6)
Hemoglobin: 13.7 g/dL (ref 11.1–15.9)
Immature Grans (Abs): 0 10*3/uL (ref 0.0–0.1)
Immature Granulocytes: 0 %
LYMPHS ABS: 2.5 10*3/uL (ref 0.7–3.1)
LYMPHS: 35 %
MCH: 29.2 pg (ref 26.6–33.0)
MCHC: 33.5 g/dL (ref 31.5–35.7)
MCV: 87 fL (ref 79–97)
MONOS ABS: 0.4 10*3/uL (ref 0.1–0.9)
Monocytes: 6 %
NEUTROS ABS: 3.9 10*3/uL (ref 1.4–7.0)
Neutrophils: 56 %
PLATELETS: 265 10*3/uL (ref 150–379)
RBC: 4.69 x10E6/uL (ref 3.77–5.28)
RDW: 13.9 % (ref 12.3–15.4)
WBC: 7 10*3/uL (ref 3.4–10.8)

## 2016-02-06 LAB — LIPID PANEL
CHOLESTEROL TOTAL: 312 mg/dL — AB (ref 100–199)
Chol/HDL Ratio: 3 ratio units (ref 0.0–4.4)
HDL: 104 mg/dL (ref >39)
LDL Calculated: 170 mg/dL — ABNORMAL HIGH (ref 0–99)
Triglycerides: 190 mg/dL — ABNORMAL HIGH (ref 0–149)
VLDL Cholesterol Cal: 38 mg/dL (ref 5–40)

## 2016-02-06 LAB — VITAMIN D 25 HYDROXY (VIT D DEFICIENCY, FRACTURES): Vit D, 25-Hydroxy: 33.7 ng/mL (ref 30.0–100.0)

## 2016-02-06 LAB — VITAMIN B12: Vitamin B-12: 494 pg/mL (ref 211–946)

## 2016-02-06 LAB — THYROID PANEL WITH TSH
FREE THYROXINE INDEX: 1.7 (ref 1.2–4.9)
T3 UPTAKE RATIO: 24 % (ref 24–39)
T4, Total: 7.2 ug/dL (ref 4.5–12.0)
TSH: 2.13 u[IU]/mL (ref 0.450–4.500)

## 2016-02-10 ENCOUNTER — Telehealth: Payer: Self-pay | Admitting: Family Medicine

## 2016-02-13 NOTE — Telephone Encounter (Signed)
Patient aware of results 10/2

## 2016-02-17 ENCOUNTER — Other Ambulatory Visit: Payer: Self-pay | Admitting: *Deleted

## 2016-02-17 MED ORDER — MESALAMINE 1.2 G PO TBEC: 2.4000 g | DELAYED_RELEASE_TABLET | Freq: Every day | ORAL | 0 refills | Status: DC

## 2016-03-20 ENCOUNTER — Telehealth: Payer: Self-pay | Admitting: Cardiovascular Disease

## 2016-03-20 NOTE — Telephone Encounter (Signed)
She states she is having weakness and fatigue. It is better since decreasing her Lisinopril from 20 mg to 10 mg, she did on her own Has been monitoring her blood pressure at home and is concerned with her diastolic reading being mostly in the [id number], systolic ranging mostly [id number]  Will forward to Jacklynn Ganong D for review

## 2016-03-20 NOTE — Telephone Encounter (Signed)
Pt called to ask about her bottom number of her BP being  to low and and having no energy.  Pt would like a call back please.

## 2016-03-20 NOTE — Telephone Encounter (Signed)
I agree her diastolics being so low could be contributing to her fatigue. Would recommend trial off medication. Monitor for 1-2 weeks if pressures trend back up could consider lower dose of 5mg  daily.

## 2016-03-20 NOTE — Telephone Encounter (Signed)
Advised patient of recommendations, verbalized understanding  

## 2016-03-23 NOTE — Telephone Encounter (Signed)
I agree MCr 

## 2016-03-31 ENCOUNTER — Encounter (INDEPENDENT_AMBULATORY_CARE_PROVIDER_SITE_OTHER): Payer: Self-pay

## 2016-03-31 ENCOUNTER — Other Ambulatory Visit: Payer: Medicare Other

## 2016-03-31 ENCOUNTER — Ambulatory Visit (INDEPENDENT_AMBULATORY_CARE_PROVIDER_SITE_OTHER): Payer: Medicare Other

## 2016-03-31 DIAGNOSIS — M8580 Other specified disorders of bone density and structure, unspecified site: Secondary | ICD-10-CM

## 2016-03-31 DIAGNOSIS — M899 Disorder of bone, unspecified: Secondary | ICD-10-CM | POA: Diagnosis not present

## 2016-04-06 ENCOUNTER — Other Ambulatory Visit: Payer: Medicare Other

## 2016-04-06 DIAGNOSIS — E876 Hypokalemia: Secondary | ICD-10-CM

## 2016-04-07 DIAGNOSIS — H353130 Nonexudative age-related macular degeneration, bilateral, stage unspecified: Secondary | ICD-10-CM | POA: Insufficient documentation

## 2016-04-07 LAB — BMP8+EGFR
BUN/Creatinine Ratio: 11 — ABNORMAL LOW (ref 12–28)
BUN: 7 mg/dL — ABNORMAL LOW (ref 8–27)
CALCIUM: 9 mg/dL (ref 8.7–10.3)
CHLORIDE: 101 mmol/L (ref 96–106)
CO2: 27 mmol/L (ref 18–29)
Creatinine, Ser: 0.62 mg/dL (ref 0.57–1.00)
GFR calc Af Amer: 96 mL/min/{1.73_m2} (ref >59)
GFR calc non Af Amer: 83 mL/min/{1.73_m2} (ref >59)
Glucose: 72 mg/dL (ref 65–99)
POTASSIUM: 4.2 mmol/L (ref 3.5–5.2)
Sodium: 144 mmol/L (ref 134–144)

## 2016-04-13 ENCOUNTER — Encounter: Payer: Self-pay | Admitting: Pharmacist

## 2016-04-13 ENCOUNTER — Ambulatory Visit (INDEPENDENT_AMBULATORY_CARE_PROVIDER_SITE_OTHER): Payer: Medicare Other | Admitting: Pharmacist

## 2016-04-13 ENCOUNTER — Other Ambulatory Visit: Payer: Medicare Other

## 2016-04-13 VITALS — BP 132/68 | HR 80 | Ht 62.0 in | Wt 111.0 lb

## 2016-04-13 DIAGNOSIS — Z23 Encounter for immunization: Secondary | ICD-10-CM

## 2016-04-13 DIAGNOSIS — M8580 Other specified disorders of bone density and structure, unspecified site: Secondary | ICD-10-CM

## 2016-04-13 MED ORDER — RISEDRONATE SODIUM 35 MG PO TBEC: 1.0000 | DELAYED_RELEASE_TABLET | ORAL | 11 refills | Status: DC

## 2016-04-13 NOTE — Patient Instructions (Signed)
Fall Prevention in the Home Falls can cause injuries and can affect people from all age groups. There are many simple things that you can do to make your home safe and to help prevent falls. What can I do on the outside of my home?  Regularly repair the edges of walkways and driveways and fix any cracks.  Remove high doorway thresholds.  Trim any shrubbery on the main path into your home.  Use bright outdoor lighting.  Clear walkways of debris and clutter, including tools and rocks.  Regularly check that handrails are securely fastened and in good repair. Both sides of any steps should have handrails.  Install guardrails along the edges of any raised decks or porches.  Have leaves, snow, and ice cleared regularly.  Use sand or salt on walkways during winter months.  In the garage, clean up any spills right away, including grease or oil spills. What can I do in the bathroom?  Use night lights.  Install grab bars by the toilet and in the tub and shower. Do not use towel bars as grab bars.  Use non-skid mats or decals on the floor of the tub or shower.  If you need to sit down while you are in the shower, use a plastic, non-slip stool.  Keep the floor dry. Immediately clean up any water that spills on the floor.  Remove soap buildup in the tub or shower on a regular basis.  Attach bath mats securely with double-sided non-slip rug tape.  Remove throw rugs and other tripping hazards from the floor. What can I do in the bedroom?  Use night lights.  Make sure that a bedside light is easy to reach.  Do not use oversized bedding that drapes onto the floor.  Have a firm chair that has side arms to use for getting dressed.  Remove throw rugs and other tripping hazards from the floor. What can I do in the kitchen?  Clean up any spills right away.  Avoid walking on wet floors.  Place frequently used items in easy-to-reach places.  If you need to reach for something above  you, use a sturdy step stool that has a grab bar.  Keep electrical cables out of the way.  Do not use floor polish or wax that makes floors slippery. If you have to use wax, make sure that it is non-skid floor wax.  Remove throw rugs and other tripping hazards from the floor. What can I do in the stairways?  Do not leave any items on the stairs.  Make sure that there are handrails on both sides of the stairs. Fix handrails that are broken or loose. Make sure that handrails are as long as the stairways.  Check any carpeting to make sure that it is firmly attached to the stairs. Fix any carpet that is loose or worn.  Avoid having throw rugs at the top or bottom of stairways, or secure the rugs with carpet tape to prevent them from moving.  Make sure that you have a light switch at the top of the stairs and the bottom of the stairs. If you do not have them, have them installed. What are some other fall prevention tips?  Wear closed-toe shoes that fit well and support your feet. Wear shoes that have rubber soles or low heels.  When you use a stepladder, make sure that it is completely opened and that the sides are firmly locked. Have someone hold the ladder while you are using  it. Do not climb a closed stepladder.  Add color or contrast paint or tape to grab bars and handrails in your home. Place contrasting color strips on the first and last steps.  Use mobility aids as needed, such as canes, walkers, scooters, and crutches.  Turn on lights if it is dark. Replace any light bulbs that burn out.  Set up furniture so that there are clear paths. Keep the furniture in the same spot.  Fix any uneven floor surfaces.  Choose a carpet design that does not hide the edge of steps of a stairway.  Be aware of any and all pets.  Review your medicines with your healthcare provider. Some medicines can cause dizziness or changes in blood pressure, which increase your risk of falling. Talk with  your health care provider about other ways that you can decrease your risk of falls. This may include working with a physical therapist or trainer to improve your strength, balance, and endurance. This information is not intended to replace advice given to you by your health care provider. Make sure you discuss any questions you have with your health care provider. Document Released: 04/17/2002 Document Revised: 09/24/2015 Document Reviewed: 06/01/2014 Elsevier Interactive Patient Education  2017 Elsevier Inc.                 Exercise for Strong Bones  Exercise is important to build and maintain strong bones / bone density.  There are 2 types of exercises that are important to building and maintaining strong bones:  Weight- bearing and muscle-stregthening.  Weight-bearing Exercises  These exercises include activities that make you move against gravity while staying upright. Weight-bearing exercises can be high-impact or low-impact.  High-impact weight-bearing exercises help build bones and keep them strong. If you have broken a bone due to osteoporosis or are at risk of breaking a bone, you may need to avoid high-impact exercises. If you're not sure, you should check with your healthcare provider.  Examples of high-impact weight-bearing exercises are: Dancing  Doing high-impact aerobics  Hiking  Jogging/running  Jumping Rope  Stair climbing  Tennis  Low-impact weight-bearing exercises can also help keep bones strong and are a safe alternative if you cannot do high-impact exercises.   Examples of low-impact weight-bearing exercises are: Using elliptical training machines  Doing low-impact aerobics  Using stair-step machines  Fast walking on a treadmill or outside   Muscle-Strengthening Exercises These exercises include activities where you move your body, a weight or some other resistance against gravity. They are also known as resistance exercises and include: Lifting weights   Using elastic exercise bands  Using weight machines  Lifting your own body weight  Functional movements, such as standing and rising up on your toes  Yoga and Pilates can also improve strength, balance and flexibility. However, certain positions may not be safe for people with osteoporosis or those at increased risk of broken bones. For example, exercises that have you bend forward may increase the chance of breaking a bone in the spine.   Non-Impact Exercises There are other types of exercises that can help prevent falls.  Non-impact exercises can help you to improve balance, posture and how well you move in everyday activities. Some of these exercises include: Balance exercises that strengthen your legs and test your balance, such as Tai Chi, can decrease your risk of falls.  Posture exercises that improve your posture and reduce rounded or "sloping" shoulders can help you decrease the chance of breaking a bone,   in the spine.  Functional exercises that improve how well you move can help you with everyday activities and decrease your chance of falling and breaking a bone. For example, if you have trouble getting up from a chair or climbing stairs, you should do these activities as exercises.   **A physical therapist can teach you balance, posture and functional exercises. He/she can also help you learn which exercises are safe and appropriate for you.  Rachel Walton has a physical therapy office in [place] in front of our office and referrals can be made for assessments and treatment as needed and strength and balance training.  If you would like to have an assessment with Rachel Walton and our physical therapy team please let a nurse or provider know.

## 2016-04-13 NOTE — Progress Notes (Signed)
Patient ID: Rachel Walton, female   DOB: [birth year], 82 y.o.   MRN: [id number]     HPI: Rachel Walton has a history of osteopenia.  She is here today to review her DEXA results from 03/31/2016.  Patient "chipped" or fractured her ankle due to fall (she thinks she was in her 61's)   She has tried several bisphospohonates in the past - alendronate (espophagitis), Actonel weekly (espophagitis).  She did not like taking them on empty stomach                                                        PMH: Age at menopause:  Surgical 9 (about 82yo) Hysterectomy?  Yes Oophorectomy?  Yes HRT? Yes - Former.  Type/duration: estrogen - for about 20 years Steroid Use?  No Thyroid med?  No History of cancer?  Yes - basal cell carcinoma under left nostril History of digestive disorders (ie Crohn's)?  Yes - lymphocytic colitis, IBS, GERD and Schatizki's ring, divertisulitis Current or previous eating disorders?  No Last Vitamin D Result:  33.9 (01/2016) Last GFR Result:  83 (04/06/2016)   FH/SH: Family history of osteoporosis?  Yes - 3 sisters Parent with history of hip fracture?  No Family history of breast cancer?  Yes - 1 sister Exercise?  No Smoking?  No Alcohol?  No    Calcium Assessment Calcium Intake  # of servings/day  Calcium mg  Milk (8 oz) 0  x  300  = 0  Yogurt (4 oz) 0 x  200 = 0  Cheese (1 oz) 0 x  200 = 0  Other Calcium sources   250mg   Ca supplement 500mg  bid = 1000mg    Estimated calcium intake per day 1250mg     DEXA Results Date of Test T-Score for AP Spine L1-L4 T-Score for Neck of Left Hip  03/31/2016 -1.6 -2.3  03/28/2014 -1.0 -1.8  04/06/2012 -1.0 -1.9  08/28/2009 -1.1 -1.8   FRAX 10 year estimate: Total FX risk:  21%  (consider medication if >/= 20%) Hip FX risk:  7.2%  (consider medication if >/= 3%)  Assessment: Osteopenia with decrease in BMD and high fracture risk based on family history and FRAX estimate.  Recommendations: 1.  Start  atelvia 35mg  take 1  tablet weekly - can be taken with or without food 2.  continue calcium 1200mg  daily through supplementation or diet.  3.  recommend weight bearing exercise - 30 minutes at least 4 days per week.   4.  Counseled and educated about fall risk and prevention. 5.  Patient also received influenza vaccines in office today.  Recheck DEXA:  2 years  Time spent counseling patient:  45 minutes

## 2016-04-16 NOTE — Progress Notes (Signed)
Patient stopped by office today.  She picked up Atelvia from pharmacy yesterday and was concerned about GI side effects. She has not taken Atelnvia yet. She stated that she has been feeling a tightness when she eats over the last 2-3 days.  Patient does have history of gastric ulcer and stricture.  She was suppose to see Dr Hilarie Fredrickson / GI tomorrow but has to reschedule.  I discussed side effect of Atlevia with patient.  Since she is having GI symptoms advised her not to start [place].  I discussed other alternatives such as Reclast and Prolia.  She states her sisters are taking Reclast and would like to talk to them about their experience.  She will looking over information I provided and make decision by her next appt with PCP - Dr [name].

## 2016-04-17 ENCOUNTER — Ambulatory Visit: Payer: Medicare Other | Admitting: Internal Medicine

## 2016-04-21 ENCOUNTER — Ambulatory Visit (INDEPENDENT_AMBULATORY_CARE_PROVIDER_SITE_OTHER): Payer: Medicare Other | Admitting: Family

## 2016-04-21 ENCOUNTER — Encounter: Payer: Self-pay | Admitting: Family

## 2016-04-21 VITALS — BP 131/70 | HR 78 | Temp 97.2°F | Ht 62.0 in | Wt 115.2 lb

## 2016-04-21 DIAGNOSIS — N750 Cyst of Bartholin's gland: Secondary | ICD-10-CM

## 2016-04-21 MED ORDER — AMOXICILLIN-POT CLAVULANATE 500-125 MG PO TABS: 1.0000 | ORAL_TABLET | Freq: Two times a day (BID) | ORAL | 0 refills | Status: DC

## 2016-04-21 MED ORDER — CLINDAMYCIN HCL 300 MG PO CAPS: 300.0000 mg | ORAL_CAPSULE | Freq: Three times a day (TID) | ORAL | 0 refills | Status: DC

## 2016-04-21 NOTE — Progress Notes (Signed)
   Subjective:    Patient ID: Rachel Walton, female    DOB: [birth year], 82 y.o.   MRN: [id number]  HPI PT presents to the office today with a vaginal pain. PT states the pain started yesterday and thinks she may have a cyst. Pt has soaked in a warm bath that seemed to help. Pt states she is having constant tenderness and aching pain. States she has hemorrhoids.    Review of Systems  All other systems reviewed and are negative.      Objective:   Physical Exam  Constitutional: She is oriented to person, place, and time. She appears well-developed and well-nourished. No distress.  HENT:  Head: Normocephalic.  Cardiovascular: Normal rate, regular rhythm, normal heart sounds and intact distal pulses.   No murmur heard. Pulmonary/Chest: Effort normal and breath sounds normal. No respiratory distress. She has no wheezes.  Abdominal: Soft. Bowel sounds are normal. She exhibits no distension. There is no tenderness.  Genitourinary: Vagina normal. Rectal exam shows external hemorrhoid.    There is tenderness on the left labia.  Musculoskeletal: Normal range of motion. She exhibits no edema or tenderness.  Neurological: She is alert and oriented to person, place, and time.  Skin: Skin is warm and dry.  Psychiatric: She has a normal mood and affect. Her behavior is normal. Judgment and thought content normal.  Vitals reviewed.     BP (!) 154/67   Pulse 78   Temp 97.2 F (36.2 C) (Oral)   Ht 5\' 2"  (1.575 m)   Wt 115 lb 3.2 oz (52.3 kg)   BMI 21.07 kg/m      Assessment & Plan:  1. Bartholin cyst -Warm compresses -Do not pick or squeeze -RTO on 04/27/16 or return if symptoms worsens  - clindamycin (CLEOCIN) 300 MG capsule; Take 1 capsule (300 mg total) by mouth 3 (three) times daily.  Dispense: 28 capsule; Refill: 0 - amoxicillin-clavulanate (AUGMENTIN) 500-125 MG tablet; Take 1 tablet (500 mg total) by mouth 2 (two) times daily.  Dispense: 14 tablet; Refill: 0  [name],  FNP

## 2016-04-21 NOTE — Patient Instructions (Signed)
Bartholin Cyst or Abscess Introduction A Bartholin cyst is a fluid-filled sac that forms on a Bartholin gland. Bartholin glands are small glands that are located within the folds of skin (labia) along the sides of the lower opening of the vagina. These glands produce a fluid to moisten the outside of the vagina during sexual intercourse. A Bartholin cyst causes a bulge on the side of the vagina. A cyst that is not large or infected may not cause symptoms or problems. However, if the fluid within the cyst becomes infected, the cyst can turn into an abscess. An abscess may cause discomfort or pain. What are the causes? A Bartholin cyst may develop when the duct of the gland becomes blocked. In many cases, the cause of this is not known. Various kinds of bacteria can cause the cyst to become infected and develop into an abscess. What increases the risk? You may be at an increased risk of developing a Bartholin cyst or abscess if:  You are a woman of reproductive age.  You have a history of previous Bartholin cysts or abscesses.  You have diabetes.  You have a sexually transmitted disease (STD). What are the signs or symptoms? The severity of symptoms varies depending on the size of the cyst and whether it is infected. Symptoms may include:  A bulge or swelling near the lower opening of your vagina.  Discomfort or pain.  Redness.  Pain during sexual intercourse.  Pain when walking.  Fluid draining from the area. How is this diagnosed? Your health care provider may make a diagnosis based on your symptoms and a physical exam. He or she will look for swelling in your vaginal area. Blood tests may be done to check for infections. A sample of fluid from the cyst or abscess may also be taken to be tested in a lab. How is this treated? Small cysts that are not infected may not require any treatment. These often go away on their own. Yourhealth care provider will recommend hot baths and the use  of warm compresses. These may also be part of the treatment for an abscess. Treatment options for a large cyst or abscess may include:  Antibiotic medicine.  A surgical procedure to drain the abscess. One of the following procedures may be done:  Incision and drainage. An incision is made in the cyst or abscess so that the fluid drains out. A catheter may be placed inside the cyst so that it does not close and fill up with fluid again. The catheter will be removed after you have a follow-up visit with a specialist (gynecologist).  Marsupialization. The cyst or abscess is opened and kept open by stitching the edges of the skin to the walls of the cyst or abscess. This allows it to continue to drain and not fill up with fluid again. If you have cysts or abscesses that keep returning and have required incision and drainage multiple times, your health care provider may talk to you about surgery to remove the Bartholin gland. Follow these instructions at home:  Take medicines only as directed by your health care provider.  If you were prescribed an antibiotic medicine, finish it all even if you start to feel better.  Apply warm, wet compresses to the area or take warm, shallow baths that cover your pelvic region (sitz baths) several times a day or as directed by your health care provider.  Do not squeeze the cyst or apply heavy pressure to it.  Do not  have sexual intercourse until the cyst has gone away.  If your cyst or abscess was opened, a small piece of gauze or a drain may have been placed in the area to allow drainage. Do not remove the gauze or the drain until directed by your health care provider.  Wear feminine pads-not tampons-as needed for any drainage or bleeding.  Keep all follow-up visits as directed by your health care provider. This is important. How is this prevented? Take these steps to help prevent a Bartholin cyst from returning:  Practice good hygiene.  Clean your  vaginal area with mild soap and a soft cloth when you bathe.  Practice safe sex to prevent STDs. Contact a health care provider if:  You have increased pain, swelling, or redness in the area of the cyst.  Puslike drainage is coming from the cyst.  You have a fever. This information is not intended to replace advice given to you by your health care provider. Make sure you discuss any questions you have with your health care provider. Document Released: 04/27/2005 Document Revised: 10/03/2015 Document Reviewed: 12/11/2013  2017 Elsevier

## 2016-04-22 ENCOUNTER — Other Ambulatory Visit: Payer: Self-pay | Admitting: *Deleted

## 2016-04-22 MED ORDER — ROSUVASTATIN CALCIUM 5 MG PO TABS: 5.0000 mg | ORAL_TABLET | ORAL | 1 refills | Status: DC

## 2016-04-23 ENCOUNTER — Telehealth: Payer: Self-pay | Admitting: Family Medicine

## 2016-04-27 ENCOUNTER — Encounter: Payer: Self-pay | Admitting: Family

## 2016-04-27 ENCOUNTER — Ambulatory Visit (INDEPENDENT_AMBULATORY_CARE_PROVIDER_SITE_OTHER): Payer: Medicare Other | Admitting: Family

## 2016-04-27 VITALS — BP 138/78 | HR 87 | Temp 97.9°F | Ht 62.0 in | Wt 114.0 lb

## 2016-04-27 DIAGNOSIS — N750 Cyst of Bartholin's gland: Secondary | ICD-10-CM | POA: Diagnosis not present

## 2016-04-27 NOTE — Telephone Encounter (Signed)
Pt needs to be seen and will need lacerated if antibiotics have not caused it drain yet.

## 2016-04-27 NOTE — Progress Notes (Signed)
   Subjective:    Patient ID: Rachel Walton, female    DOB: 1929-12-21, 82 y.o.   MRN: [id number]  HPI Pr presents to the office today to recheck bartholin cyst in left labia. Pt states she was able to take augmentin, but only took one dose of the clindamycin related to diarrhea. PT states she continues to have constant pain and tenderness. She is soaking in warm water 3 times a day.    Review of Systems  Skin: Positive for wound.  All other systems reviewed and are negative.      Objective:   Physical Exam  Constitutional: She appears well-developed and well-nourished.  Cardiovascular: Normal rate, regular rhythm, normal heart sounds and intact distal pulses.   Pulmonary/Chest: Effort normal and breath sounds normal.  Genitourinary:    There is tenderness (large cyst present in left inner labia) on the left labia.   Verbal consent given by patient. Local anesthesia Lidocaine 2% without 45ml Betadine prep Small incision and large amount of serosanguineous Area clean and pad given pt to wear home    BP 138/78   Pulse 87   Temp 97.9 F (36.6 C) (Oral)   Ht 5\' 2"  (1.575 m)   Wt 114 lb (51.7 kg)   BMI 20.85 kg/m      Assessment & Plan:  1. Bartholin cyst -Keep clean and dry -Continue warm baths -If becomes larger or does not improve will send to [place] prn   [name], FNP

## 2016-04-27 NOTE — Patient Instructions (Signed)
Bartholin Cyst or Abscess Introduction A Bartholin cyst is a fluid-filled sac that forms on a Bartholin gland. Bartholin glands are small glands that are located within the folds of skin (labia) along the sides of the lower opening of the vagina. These glands produce a fluid to moisten the outside of the vagina during sexual intercourse. A Bartholin cyst causes a bulge on the side of the vagina. A cyst that is not large or infected may not cause symptoms or problems. However, if the fluid within the cyst becomes infected, the cyst can turn into an abscess. An abscess may cause discomfort or pain. What are the causes? A Bartholin cyst may develop when the duct of the gland becomes blocked. In many cases, the cause of this is not known. Various kinds of bacteria can cause the cyst to become infected and develop into an abscess. What increases the risk? You may be at an increased risk of developing a Bartholin cyst or abscess if:  You are a woman of reproductive age.  You have a history of previous Bartholin cysts or abscesses.  You have diabetes.  You have a sexually transmitted disease (STD). What are the signs or symptoms? The severity of symptoms varies depending on the size of the cyst and whether it is infected. Symptoms may include:  A bulge or swelling near the lower opening of your vagina.  Discomfort or pain.  Redness.  Pain during sexual intercourse.  Pain when walking.  Fluid draining from the area. How is this diagnosed? Your health care provider may make a diagnosis based on your symptoms and a physical exam. He or she will look for swelling in your vaginal area. Blood tests may be done to check for infections. A sample of fluid from the cyst or abscess may also be taken to be tested in a lab. How is this treated? Small cysts that are not infected may not require any treatment. These often go away on their own. Yourhealth care provider will recommend hot baths and the use  of warm compresses. These may also be part of the treatment for an abscess. Treatment options for a large cyst or abscess may include:  Antibiotic medicine.  A surgical procedure to drain the abscess. One of the following procedures may be done:  Incision and drainage. An incision is made in the cyst or abscess so that the fluid drains out. A catheter may be placed inside the cyst so that it does not close and fill up with fluid again. The catheter will be removed after you have a follow-up visit with a specialist (gynecologist).  Marsupialization. The cyst or abscess is opened and kept open by stitching the edges of the skin to the walls of the cyst or abscess. This allows it to continue to drain and not fill up with fluid again. If you have cysts or abscesses that keep returning and have required incision and drainage multiple times, your health care provider may talk to you about surgery to remove the Bartholin gland. Follow these instructions at home:  Take medicines only as directed by your health care provider.  If you were prescribed an antibiotic medicine, finish it all even if you start to feel better.  Apply warm, wet compresses to the area or take warm, shallow baths that cover your pelvic region (sitz baths) several times a day or as directed by your health care provider.  Do not squeeze the cyst or apply heavy pressure to it.  Do not  have sexual intercourse until the cyst has gone away.  If your cyst or abscess was opened, a small piece of gauze or a drain may have been placed in the area to allow drainage. Do not remove the gauze or the drain until directed by your health care provider.  Wear feminine pads-not tampons-as needed for any drainage or bleeding.  Keep all follow-up visits as directed by your health care provider. This is important. How is this prevented? Take these steps to help prevent a Bartholin cyst from returning:  Practice good hygiene.  Clean your  vaginal area with mild soap and a soft cloth when you bathe.  Practice safe sex to prevent STDs. Contact a health care provider if:  You have increased pain, swelling, or redness in the area of the cyst.  Puslike drainage is coming from the cyst.  You have a fever. This information is not intended to replace advice given to you by your health care provider. Make sure you discuss any questions you have with your health care provider. Document Released: 04/27/2005 Document Revised: 10/03/2015 Document Reviewed: 12/11/2013  2017 Elsevier

## 2016-04-27 NOTE — Telephone Encounter (Signed)
Pt has appt here at office shortly

## 2016-05-05 ENCOUNTER — Telehealth: Payer: Self-pay | Admitting: Family Medicine

## 2016-05-05 ENCOUNTER — Other Ambulatory Visit: Payer: Medicare Other

## 2016-05-05 DIAGNOSIS — R300 Dysuria: Secondary | ICD-10-CM

## 2016-05-05 NOTE — Telephone Encounter (Signed)
Urine ordered and pt aware

## 2016-05-06 LAB — URINALYSIS, COMPLETE
BILIRUBIN UA: NEGATIVE
Glucose, UA: NEGATIVE
Ketones, UA: NEGATIVE
Nitrite, UA: NEGATIVE
PH UA: 6 (ref 5.0–7.5)
Protein, UA: NEGATIVE
RBC UA: NEGATIVE
Specific Gravity, UA: 1.005 (ref 1.005–1.030)
Urobilinogen, Ur: 0.2 mg/dL (ref 0.2–1.0)

## 2016-05-06 LAB — MICROSCOPIC EXAMINATION
CASTS: NONE SEEN /LPF
EPITHELIAL CELLS (NON RENAL): NONE SEEN /HPF (ref 0–10)

## 2016-05-07 LAB — URINE CULTURE

## 2016-05-20 ENCOUNTER — Telehealth: Payer: Self-pay | Admitting: Family Medicine

## 2016-05-20 DIAGNOSIS — G250 Essential tremor: Secondary | ICD-10-CM | POA: Insufficient documentation

## 2016-05-20 NOTE — Telephone Encounter (Signed)
Aware of meningoencephalitis diagnosis.

## 2016-06-09 ENCOUNTER — Ambulatory Visit (INDEPENDENT_AMBULATORY_CARE_PROVIDER_SITE_OTHER): Payer: Medicare Other | Admitting: Family Medicine

## 2016-06-09 ENCOUNTER — Encounter: Payer: Self-pay | Admitting: Family Medicine

## 2016-06-09 VITALS — BP 160/70 | HR 74 | Temp 97.1°F | Ht 62.0 in | Wt 116.0 lb

## 2016-06-09 DIAGNOSIS — I10 Essential (primary) hypertension: Secondary | ICD-10-CM | POA: Diagnosis not present

## 2016-06-09 DIAGNOSIS — R251 Tremor, unspecified: Secondary | ICD-10-CM | POA: Diagnosis not present

## 2016-06-09 DIAGNOSIS — E559 Vitamin D deficiency, unspecified: Secondary | ICD-10-CM

## 2016-06-09 DIAGNOSIS — E7800 Pure hypercholesterolemia, unspecified: Secondary | ICD-10-CM

## 2016-06-09 NOTE — Patient Instructions (Addendum)
Medicare Annual Wellness Visit  [facility] and the medical providers at [facility] strive to bring you the best medical care.  In doing so we not only want to address your current medical conditions and concerns but also to detect new conditions early and prevent illness, disease and health-related problems.    Medicare offers a yearly Wellness Visit which allows our clinical staff to assess your need for preventative services including immunizations, lifestyle education, counseling to decrease risk of preventable diseases and screening for fall risk and other medical concerns.    This visit is provided free of charge (no copay) for all Medicare recipients. The clinical pharmacists at [facility] have begun to conduct these Wellness Visits which will also include a thorough review of all your medications.    As you primary medical provider recommend that you make an appointment for your Annual Wellness Visit if you have not done so already this year.  You may set up this appointment before you leave today or you may call back [phone] and schedule an appointment.  Please make sure when you call that you mention that you are scheduling your Annual Wellness Visit with the clinical pharmacist so that the appointment may be made for the proper length of time.     Continue current medications. Continue good therapeutic lifestyle changes which include good diet and exercise. Fall precautions discussed with patient. If an FOBT was given today- please return it to our front desk. If you are over 79 years old - you may need Prevnar 59 or the adult Pneumonia vaccine.  **Flu shots are available--- please call and schedule a FLU-CLINIC appointment**  After your visit with [place] today you will receive a survey in the mail or online from [employer] regarding your care with [place]. Please take a moment to fill this out. Your feedback is very  important to [place] as you can help [place] better understand your patient needs as well as improve your experience and satisfaction. WE CARE ABOUT YOU!!!   The patient will try a lower dose of propranolol injured 10 or 23 times daily to see if she can tolerate this better and see if this helps her tremor better. If she does she will take this and try to take less of the clonazepam. She will continue to follow-up with the gastroenterologist. She will stay well hydrated and drink plenty of fluids.

## 2016-06-09 NOTE — Progress Notes (Signed)
Subjective:    Patient ID: Rachel Walton, female    DOB: Apr 25, 1930, 83 y.o.   MRN: [id number]  HPI Pt here for follow up and management of chronic medical problems which includes hyperlipidemia and hypertension. She is taking medication regularly.She actually has no specific complaints today. She is due to get her lab work today. She does not need any refills. She brings a bunch of blood pressure readings from home and all of these readings except for very few are excellent and at goal. The patient was tried on some propranolol 40 mg 3 times a day for her tremor and she did not tolerate this at all and went back on temazepam that she had at home. I told her it might be reasonable to try a fourth of the 40 mg propranolol 3 times a day to see if this might help and would not be as strong and has as bad of affect on its her side effect wise. She will do this. She will take the clonazepam if needed. She denies any chest pain or shortness of breath. She denies any problems with her stomach now and she is been taking medicine prescribed by the gastroenterologist which is lialda. She says this has helped her stomach more than anything she is taken. She is very pleased with this. She has not seen any blood in the stool in her bowel habits or more back to normal. She is passing her water without problems.    Patient Active Problem List   Diagnosis Date Noted  . Basal cell carcinoma of nose 02/05/2016  . Lymphocytic colitis 12/20/2015  . Barrett's esophagus 12/20/2015  . 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 Depression 11/07/2015  . Pain in the chest   . Chest pain 10/09/2015  . Hypokalemia 10/09/2015  . Anemia 10/09/2015  . Esophageal stricture 12/11/2013  . Allergic rhinitis due to pollen 12/11/2013  . Essential hypertension 12/11/2013  . Generalized anxiety disorder 08/21/2013  . Gall stones 06/15/2013  . Hyperlipemia 11/21/2012  . Tremor   . Postmenopausal   . Colon polyp   . Osteopenia of the  elderly   . Kyphosis   . Recurrent UTI   . Atrophic vaginitis   . Diverticulosis    Outpatient Encounter Prescriptions as of 06/09/2016  Medication Sig  . B Complex-C-E-Zn (STRESS B-COMPLEX/VIT C/ZINC) TABS Take by mouth.  . Calcium Carbonate-Vitamin D (CALCIUM 600+D) 600-200 MG-UNIT TABS Take 1 tablet by mouth daily. Take with a meal (Patient taking differently: Take 2 tablets by mouth daily. Take with a meal)  . clonazePAM (KLONOPIN) 0.5 MG tablet Take 1 tablet (0.5 mg total) by mouth 2 (two) times daily as needed (stress).  . fish oil-omega-3 fatty acids 1000 MG capsule Take 2 g by mouth daily.  . fluticasone (FLONASE) 50 MCG/ACT nasal spray Place 1 spray into both nostrils daily as needed for rhinitis.   . Multiple Vitamins-Minerals (ICAPS AREDS FORMULA PO) Take 1 tablet by mouth 2 (two) times daily before a meal.   . potassium chloride (K-DUR) 10 MEQ tablet TAKE ONE (1) TABLET EACH DAY  . rosuvastatin (CRESTOR) 5 MG tablet Take 1 tablet (5 mg total) by mouth 2 (two) times a week.  . Vitamin D, Cholecalciferol, 1000 units TABS Take 2,000 Units by mouth 2 (two) times a week.  . [DISCONTINUED] AMBULATORY NON FORMULARY MEDICATION Medication Name: Hydrocortisone 1% rectal suppositories-insert in rectum every night as needed for hemorrhoids (Patient not taking: Reported on 06/09/2016)  . [DISCONTINUED] amoxicillin-clavulanate (AUGMENTIN)  500-125 MG tablet Take 1 tablet (500 mg total) by mouth 2 (two) times daily.  . [DISCONTINUED] clindamycin (CLEOCIN) 300 MG capsule Take 1 capsule (300 mg total) by mouth 3 (three) times daily. (Patient not taking: Reported on 04/27/2016)  . [DISCONTINUED] Risedronate Sodium (ATELVIA) 35 MG TBEC Take 1 tablet (35 mg total) by mouth once a week. May be taken with food. Do not lie down or recline for 30 minutes after taking atelvia.   No facility-administered encounter medications on file as of 06/09/2016.      Review of Systems  Constitutional: Negative.     HENT: Negative.   Eyes: Negative.   Respiratory: Negative.   Cardiovascular: Negative.   Gastrointestinal: Negative.   Endocrine: Negative.   Genitourinary: Negative.   Musculoskeletal: Negative.   Skin: Negative.   Allergic/Immunologic: Negative.   Neurological: Negative.   Hematological: Negative.   Psychiatric/Behavioral: Negative.        Objective:   Physical Exam  Constitutional: She is oriented to person, place, and time. She appears well-developed and well-nourished. No distress.  The patient is pleasant and alert.  HENT:  Head: Normocephalic and atraumatic.  Right Ear: External ear normal.  Left Ear: External ear normal.  Nose: Nose normal.  Mouth/Throat: Oropharynx is clear and moist. No oropharyngeal exudate.  Eyes: Conjunctivae and EOM are normal. Pupils are equal, round, and reactive to light. Right eye exhibits no discharge. Left eye exhibits no discharge. No scleral icterus.  Neck: Normal range of motion. Neck supple. No thyromegaly present.  Cardiovascular: Normal rate, regular rhythm, normal heart sounds and intact distal pulses.   No murmur heard. The heart is regular at 60/m  Pulmonary/Chest: Effort normal and breath sounds normal. No respiratory distress. She has no wheezes. She has no rales.  Abdominal: Soft. Bowel sounds are normal. She exhibits no mass. There is no tenderness. There is no rebound and no guarding.  There is no abdominal pain tenderness or organ enlargement  Musculoskeletal: Normal range of motion. She exhibits no edema.  Lymphadenopathy:    She has no cervical adenopathy.  Neurological: She is alert and oriented to person, place, and time. She has normal reflexes. No cranial nerve deficit.  Skin: Skin is warm and dry. No rash noted.  Psychiatric: She has a normal mood and affect. Her behavior is normal. Judgment and thought content normal.  Nursing note and vitals reviewed.  BP (!) 165/73 (BP Location: Right Arm)   Pulse 74   Temp  97.1 F (36.2 C) (Oral)   Ht 5' 2" (1.575 m)   Wt 116 lb (52.6 kg)   BMI 21.22 kg/m   Repeat blood pressure was 160/70 in the right arm sitting. She brings in readings from outside and all of these are excellent and I'm hesitant to increase the medicine anymore than what she is currently taking.      Assessment & Plan:  1. Vitamin D deficiency -The patient will continue current treatment pending results of lab work - CBC with Differential/Platelet - VITAMIN D 25 Hydroxy (Vit-D Deficiency, Fractures)  2. Pure hypercholesterolemia -Continue current treatment pending results of lab work along with aggressive therapeutic lifestyle changes - CBC with Differential/Platelet - Lipid panel  3. Essential hypertension -Both blood pressure today were elevated. The patient brings in readings from outside which are all excellent. No change in treatment today. She is going to take her try the propranolol 10-20 mg 3 times a day and this may have some positive impact on lowering  the blood pressure also. - CBC with Differential/Platelet - BMP8+EGFR - Hepatic function panel  4. Tremor -Retry propranolol at either 10 or 20 mg 3 times daily  Patient Instructions                       Medicare Annual Wellness Visit  [facility] and the medical providers at [facility] strive to bring you the best medical care.  In doing so we not only want to address your current medical conditions and concerns but also to detect new conditions early and prevent illness, disease and health-related problems.    Medicare offers a yearly Wellness Visit which allows our clinical staff to assess your need for preventative services including immunizations, lifestyle education, counseling to decrease risk of preventable diseases and screening for fall risk and other medical concerns.    This visit is provided free of charge (no copay) for all Medicare recipients. The clinical pharmacists at [facility] have begun to conduct these Wellness Visits which will also include a thorough review of all your medications.    As you primary medical provider recommend that you make an appointment for your Annual Wellness Visit if you have not done so already this year.  You may set up this appointment before you leave today or you may call back [phone]) and schedule an appointment.  Please make sure when you call that you mention that you are scheduling your Annual Wellness Visit with the clinical pharmacist so that the appointment may be made for the proper length of time.     Continue current medications. Continue good therapeutic lifestyle changes which include good diet and exercise. Fall precautions discussed with patient. If an FOBT was given today- please return it to our front desk. If you are over 10 years old - you may need Prevnar 60 or the adult Pneumonia vaccine.  **Flu shots are available--- please call and schedule a FLU-CLINIC appointment**  After your visit with [place] today you will receive a survey in the mail or online from [employer] regarding your care with [place]. Please take a moment to fill this out. Your feedback is very important to [place] as you can help [place] better understand your patient needs as well as improve your experience and satisfaction. WE CARE ABOUT YOU!!!   The patient will try a lower dose of propranolol injured 10 or 23 times daily to see if she can tolerate this better and see if this helps her tremor better. If she does she will take this and try to take less of the clonazepam. She will continue to follow-up with the gastroenterologist. She will stay well hydrated and drink plenty of fluids.    Arrie Senate MD

## 2016-06-10 LAB — LIPID PANEL
CHOL/HDL RATIO: 3.2 ratio (ref 0.0–4.4)
Cholesterol, Total: 287 mg/dL — ABNORMAL HIGH (ref 100–199)
HDL: 89 mg/dL (ref >39)
LDL Calculated: 153 mg/dL — ABNORMAL HIGH (ref 0–99)
TRIGLYCERIDES: 225 mg/dL — AB (ref 0–149)
VLDL Cholesterol Cal: 45 mg/dL — ABNORMAL HIGH (ref 5–40)

## 2016-06-10 LAB — CBC WITH DIFFERENTIAL/PLATELET
Basophils Absolute: 0 10*3/uL (ref 0.0–0.2)
Basos: 1 %
EOS (ABSOLUTE): 0.2 10*3/uL (ref 0.0–0.4)
EOS: 2 %
HEMATOCRIT: 38.9 % (ref 34.0–46.6)
HEMOGLOBIN: 13.1 g/dL (ref 11.1–15.9)
IMMATURE GRANS (ABS): 0 10*3/uL (ref 0.0–0.1)
Immature Granulocytes: 0 %
LYMPHS ABS: 3 10*3/uL (ref 0.7–3.1)
Lymphs: 47 %
MCH: 29.4 pg (ref 26.6–33.0)
MCHC: 33.7 g/dL (ref 31.5–35.7)
MCV: 87 fL (ref 79–97)
MONOCYTES: 7 %
Monocytes Absolute: 0.4 10*3/uL (ref 0.1–0.9)
NEUTROS ABS: 2.7 10*3/uL (ref 1.4–7.0)
Neutrophils: 43 %
Platelets: 280 10*3/uL (ref 150–379)
RBC: 4.45 x10E6/uL (ref 3.77–5.28)
RDW: 13.2 % (ref 12.3–15.4)
WBC: 6.2 10*3/uL (ref 3.4–10.8)

## 2016-06-10 LAB — HEPATIC FUNCTION PANEL
ALT: 17 IU/L (ref 0–32)
AST: 20 IU/L (ref 0–40)
Albumin: 3.9 g/dL (ref 3.5–4.7)
Alkaline Phosphatase: 58 IU/L (ref 39–117)
Bilirubin, Direct: 0.07 mg/dL (ref 0.00–0.40)
Total Protein: 6.2 g/dL (ref 6.0–8.5)

## 2016-06-10 LAB — BMP8+EGFR
BUN / CREAT RATIO: 14 (ref 12–28)
BUN: 10 mg/dL (ref 8–27)
CALCIUM: 8.7 mg/dL (ref 8.7–10.3)
CHLORIDE: 102 mmol/L (ref 96–106)
CO2: 27 mmol/L (ref 18–29)
Creatinine, Ser: 0.72 mg/dL (ref 0.57–1.00)
GFR, EST AFRICAN AMERICAN: 89 mL/min/{1.73_m2} (ref >59)
GFR, EST NON AFRICAN AMERICAN: 77 mL/min/{1.73_m2} (ref >59)
Glucose: 88 mg/dL (ref 65–99)
POTASSIUM: 3.9 mmol/L (ref 3.5–5.2)
SODIUM: 143 mmol/L (ref 134–144)

## 2016-06-10 LAB — VITAMIN D 25 HYDROXY (VIT D DEFICIENCY, FRACTURES): VIT D 25 HYDROXY: 33.2 ng/mL (ref 30.0–100.0)

## 2016-06-16 ENCOUNTER — Ambulatory Visit: Payer: Medicare Other | Admitting: Internal Medicine

## 2016-06-23 ENCOUNTER — Other Ambulatory Visit: Payer: Self-pay | Admitting: Family Medicine

## 2016-06-23 DIAGNOSIS — E876 Hypokalemia: Secondary | ICD-10-CM

## 2016-06-24 ENCOUNTER — Telehealth: Payer: Self-pay | Admitting: Pharmacist

## 2016-06-24 NOTE — Telephone Encounter (Signed)
Called patient to follow up treatment for osteopenia.  We discussed Reclast and Atelvia.  Patient's sisters take Reclast however she refused reclast and any other medications for bones. She will continue to take calcium chewables BID

## 2016-06-26 ENCOUNTER — Telehealth: Payer: Self-pay | Admitting: Family Medicine

## 2016-06-30 NOTE — Telephone Encounter (Signed)
Patient has decided to try risendronate 35mg  ER.  She has 1 month supply at home.  Review proper administration over phone and patient repeated.

## 2016-08-11 ENCOUNTER — Telehealth: Payer: Self-pay | Admitting: Family Medicine

## 2016-08-11 NOTE — Telephone Encounter (Signed)
Patient is concerned about taking Atelvia.  She has a history of gastric ulcer / esophageal stricture.  We had discussed using Reclast instead but she did not want to take Reclast.  She is due to have follow up with GI in 1-2 months.  Recommended she hold Atelvia until after assessment from GI.  I encouraged her to reconsider Reclast but she declined.  Patient will follow up for AWV in August and I will discuss with her then

## 2016-09-17 ENCOUNTER — Encounter: Payer: Self-pay | Admitting: Pediatrics

## 2016-09-17 ENCOUNTER — Ambulatory Visit (INDEPENDENT_AMBULATORY_CARE_PROVIDER_SITE_OTHER): Payer: Medicare Other | Admitting: Pediatrics

## 2016-09-17 VITALS — BP 161/70 | HR 70 | Temp 98.2°F | Ht 62.0 in | Wt 119.6 lb

## 2016-09-17 DIAGNOSIS — I10 Essential (primary) hypertension: Secondary | ICD-10-CM

## 2016-09-17 DIAGNOSIS — M7050 Other bursitis of knee, unspecified knee: Secondary | ICD-10-CM | POA: Diagnosis not present

## 2016-09-17 DIAGNOSIS — K529 Noninfective gastroenteritis and colitis, unspecified: Secondary | ICD-10-CM

## 2016-09-17 DIAGNOSIS — R251 Tremor, unspecified: Secondary | ICD-10-CM | POA: Diagnosis not present

## 2016-09-17 NOTE — Progress Notes (Signed)
  Subjective:   Patient ID: Rachel Walton, female    DOB: August 12, 1929, 83 y.o.   MRN: [id number] CC: Varicose Veins (right leg)  HPI: Rachel Walton is a 83 y.o. female presenting for Varicose Veins (right leg)  HTN: was 130/something low per pt this morning No CP, no HA Sometimes has a discomfort in her epigastric area with food h/o ulcer, was treated and 95% resolved after a repeat EGD in 2015 Did have distal esophageal stricture that was dilated at that time.  H/o lymphocytic colitis, has been tried on multiple medications Thinks she has started to have symptoms again, more often having diarrhea, sometimes gas No abd pain Appetite has been fine Restarted a medication given to her by GI for the colitis a few days ago Thinks it has bene helping Is not sure what med it is   Noticed puffiness and blue area R medial knee this morning Has been very active last few days, mowing the lawn, doing housework she says No knee pain No knee swelling Not on her hands and knees much doing garden/yardwork  Tremor comes and goes, worse when she is nervous or upset Takes clonazepam as needed, not every day Was tried on propanolol by neurology but made her feel bad so stopped  Relevant past medical, surgical, family and social history reviewed. Allergies and medications reviewed and updated. History  Smoking Status  . Never Smoker  Smokeless Tobacco  . Never Used   ROS: Per HPI   Objective:    BP (!) 161/70   Pulse 70   Temp 98.2 F (36.8 C) (Oral)   Ht 5\' 2"  (1.575 m)   Wt 119 lb 9.6 oz (54.3 kg)   BMI 21.88 kg/m   Wt Readings from Last 3 Encounters:  09/17/16 119 lb 9.6 oz (54.3 kg)  06/09/16 116 lb (52.6 kg)  04/27/16 114 lb (51.7 kg)    Gen: NAD, alert, cooperative with exam, NCAT EYES: EOMI, no conjunctival injection, or no icterus ENT:   OP without erythema LYMPH: no cervical LAD CV: NRRR, normal S1/S2, no murmur, distal pulses 2+ b/l Resp: CTABL, no wheezes, normal  WOB Abd: +BS, soft, NTND. no guarding or organomegaly Ext: No edema, warm Neuro: Alert and oriented, slight tremor to head, hands without intention tremor today MSK: R medial knee with slight soft swelling over medial R knee, prominent vein present over swelling, soft, no induration, redness or hardness in area No knee effusion, no knee pain  Assessment & Plan:  Rachel Walton was seen today for varicose veins.  Diagnoses and all orders for this visit:  Pes anserine bursitis Rest, ice, aspercream topically if needed Any worsening let me know  Colitis Pt has restarted med from GI but isnt sure the name of it Has been helping with symptoms apprx 3-4 times a day stooling now, small amount, no accidents lialda appears to be last med prescribed by them but no longer on list She has upcoming appt with them later this month  Tremor Stable, not using clonazepam every day  Essential hypertension Elevated in clinic, will check at home, bring numbers to next office visit, already scheduled for 5 weeks  Follow up plan: As scheduled Assunta Found, MD [place]

## 2016-09-17 NOTE — Patient Instructions (Addendum)
aspercream, ice

## 2016-09-24 ENCOUNTER — Other Ambulatory Visit: Payer: Self-pay | Admitting: Internal Medicine

## 2016-10-08 ENCOUNTER — Encounter: Payer: Self-pay | Admitting: Internal Medicine

## 2016-10-08 ENCOUNTER — Ambulatory Visit (INDEPENDENT_AMBULATORY_CARE_PROVIDER_SITE_OTHER): Payer: Medicare Other | Admitting: Internal Medicine

## 2016-10-08 VITALS — BP 120/66 | HR 68 | Ht 61.0 in | Wt 120.0 lb

## 2016-10-08 DIAGNOSIS — K52832 Lymphocytic colitis: Secondary | ICD-10-CM

## 2016-10-08 DIAGNOSIS — K648 Other hemorrhoids: Secondary | ICD-10-CM | POA: Diagnosis not present

## 2016-10-08 MED ORDER — MESALAMINE 1.2 G PO TBEC: 2.4000 g | DELAYED_RELEASE_TABLET | Freq: Every day | ORAL | 3 refills | Status: DC

## 2016-10-08 NOTE — Progress Notes (Signed)
Subjective:    Patient ID: Rachel Walton, female    DOB: [birth year], 83 y.o.   MRN: [id number]  HPI Odeth Bry is an 83 year old female with a history of lymphocytic colitis, long-standing IBS, GERD with Schatzki's ring, history of gastric ulcer, remote colon polyps, and colonic diverticulosis is here for follow-up. She was last seen in September 2017. At that time we put her back on budesonide for lymphocytic colitis/diarrhea but she simply could not tolerate this medication. She felt like it made her lips tingle and also interfered with her clarity of thought. We then recommended Lialda 2.4 g daily.  She reports the Lialda helped her tremendously though she was only taking 1.2 g daily. The diarrhea and loose stools improved dramatically. However this medication expired and she is out of it. Since being out of that she reports return of loose and urgent stools. She states she'll have 3-4 bowel movements after each meal. Occasionally she will have nocturnal small volume stool. She denies melena or rectal bleeding. Denies abdominal pain. Reports some abdominal bloating after eating. She's had some regurgitation which she states is very infrequent but denies heartburn. She reports a very good appetite. Denies nausea and vomiting. Denies early satiety. Denies dysphagia and odynophagia. She does not require antacid therapy. Earlier this year she had lost about 12 pounds but she has gained this back completely. She was hospitalized briefly earlier this year for hypokalemia. This issue has been corrected.   Review of Systems  as per history of present illness, otherwise negative   Current Medications, Allergies, Past Medical History, Past Surgical History, Family History and Social History were reviewed in [employer] [employer] record.     Objective:   Physical Exam BP 120/66   Pulse 68   Ht 5\' 1"  (1.549 m) Comment: measured with out shoes  Wt 120 lb (54.4 kg)   BMI 22.67 kg/m    Constitutional: Well-developed and well-nourished. No distress. HEENT: Normocephalic and atraumatic. Oropharynx is clear and moist. Conjunctivae are normal.  No scleral icterus. Neck: Neck supple. Trachea midline. Cardiovascular: Normal rate, regular rhythm and intact distal pulses. 2/6 sem Pulmonary/chest: Effort normal and breath sounds normal. No wheezing, rales or rhonchi. Abdominal: Soft, nontender, nondistended. Bowel sounds active throughout.   Extremities: no clubbing, cyanosis, or edema Neurological: Alert and oriented to person place and time. Skin: Skin is warm and dry. Psychiatric: Normal mood and affect. Behavior is normal.  CBC    Component Value Date/Time   WBC 6.2 06/09/2016 1535   WBC 4.6 10/09/2015 1915   RBC 4.45 06/09/2016 1535   RBC 4.11 10/10/2015 0107   RBC 4.17 10/09/2015 1915   HGB 11.8 (L) 10/09/2015 1915   HCT 38.9 06/09/2016 1535   PLT 280 06/09/2016 1535   MCV 87 06/09/2016 1535   MCH 29.4 06/09/2016 1535   MCH 28.3 10/09/2015 1915   MCHC 33.7 06/09/2016 1535   MCHC 32.7 10/09/2015 1915   RDW 13.2 06/09/2016 1535   LYMPHSABS 3.0 06/09/2016 1535   MONOABS 0.3 10/09/2015 1915   EOSABS 0.2 06/09/2016 1535   BASOSABS 0.0 06/09/2016 1535   CMP     Component Value Date/Time   NA 143 06/09/2016 1535   K 3.9 06/09/2016 1535   CL 102 06/09/2016 1535   CO2 27 06/09/2016 1535   GLUCOSE 88 06/09/2016 1535   GLUCOSE 90 12/20/2015 1520   BUN 10 06/09/2016 1535   CREATININE 0.72 06/09/2016 1535   CREATININE 0.81 12/20/2015 1520  CALCIUM 8.7 06/09/2016 1535   PROT 6.2 06/09/2016 1535   ALBUMIN 3.9 06/09/2016 1535   AST 20 06/09/2016 1535   ALT 17 06/09/2016 1535   ALKPHOS 58 06/09/2016 1535   BILITOT <0.2 06/09/2016 1535   GFRNONAA 77 06/09/2016 1535   GFRNONAA 61 11/21/2012 1632   GFRAA 89 06/09/2016 1535   GFRAA 70 11/21/2012 1632      Assessment & Plan:   83 year old female with a history of lymphocytic colitis, long-standing IBS, GERD  with Schatzki's ring with probable Barrett's esophagus, history of gastric ulcer, remote colon polyps, and colonic diverticulosis is here for follow-up.  1. Lymphocytic colitis -- she did not tolerate budesonide or bismuth therapy.she seemed to have a really positive response to Lialda but the medication ran out and she has not been using.  given the loose stools and diarrhea she is having I'm going to resume mesalamine in the form of Lialda 2.4 g daily.  I encouraged her to use a 2.4 g daily dose for at least 2 weeks and preferably permanently. If she can get by with 1.2 g daily this would be okay as long as symptoms are completely in remission. I asked that she notify me if she has recurrent diarrhea or any abdominal complaints after resuming mesalamine. She voices understanding   2. Internal hemorrhoids -- symptomatically very improved when stools improved on Lialda. They've been bothering her lately with loose stools. She is using Preparation H with good result. Hemorrhoidal banding is an option if symptoms remain once we get her diarrhea back under control with mesalamine.   3. GERD with history of Barrett's esophagus -- she had a GE junction/gastric cardia ulceration in 2015. This had healed on surveillance endoscopy with Dr. Olevia Perches. Biopsies showed Barrett's esophagus without dysplasia both in May 2015 and August 2015. Currently she is having no upper GI complaint including no heartburn, dysphagia or odynophagia. At next follow-up I plan to discuss this issue with her. Discontinuation of surveillance based on age may be appropriate but I would like to discuss this with her formally.   25 minutes spent with the patient today. Greater than 50% was spent in counseling and coordination of care with the patient

## 2016-10-08 NOTE — Patient Instructions (Signed)
We have sent the following medications to your pharmacy for you to pick up at your convenience: Lialda 2.4 grams daily  Please follow up with Dr Hilarie Fredrickson in 6 months  If you are age 81 or older, your body mass index should be between 23-30. Your Body mass index is 22.67 kg/m. If this is out of the aforementioned range listed, please consider follow up with your Primary Care Provider.  If you are age 25 or younger, your body mass index should be between 19-25. Your Body mass index is 22.67 kg/m. If this is out of the aformentioned range listed, please consider follow up with your Primary Care Provider.

## 2016-10-21 ENCOUNTER — Other Ambulatory Visit: Payer: Self-pay | Admitting: Family Medicine

## 2016-10-21 ENCOUNTER — Telehealth: Payer: Self-pay

## 2016-10-21 ENCOUNTER — Telehealth: Payer: Self-pay | Admitting: Family Medicine

## 2016-10-21 DIAGNOSIS — M8580 Other specified disorders of bone density and structure, unspecified site: Secondary | ICD-10-CM | POA: Insufficient documentation

## 2016-10-21 DIAGNOSIS — N644 Mastodynia: Secondary | ICD-10-CM

## 2016-10-21 NOTE — Telephone Encounter (Signed)
Orders placed and appointment made for the [facility] on 10/26/16 at 1:20 with a registration time of 1pm

## 2016-10-21 NOTE — Telephone Encounter (Signed)
Please advise 

## 2016-10-21 NOTE — Telephone Encounter (Signed)
Pt would like for you to review her DEXA and discuss whether or not she is a candidate for the shots

## 2016-10-21 NOTE — Telephone Encounter (Signed)
Patient is considering Prolia for treatment of osteopenia - with high fracture risk.  She has refused Reclast and oral bisphosphonates in past due to concerns with side effects.  I told her that coverage will be dependent on her insurance company's policy.  Insurance verification submitted to [facility].

## 2016-10-26 ENCOUNTER — Ambulatory Visit
Admission: RE | Admit: 2016-10-26 | Discharge: 2016-10-26 | Disposition: A | Discharge status: Home / Self Care | Payer: Medicare Other | Source: Ambulatory Visit | Attending: Family Medicine | Admitting: Family Medicine

## 2016-10-26 DIAGNOSIS — N644 Mastodynia: Secondary | ICD-10-CM

## 2016-10-27 ENCOUNTER — Ambulatory Visit (INDEPENDENT_AMBULATORY_CARE_PROVIDER_SITE_OTHER): Payer: Medicare Other | Admitting: Family Medicine

## 2016-10-27 ENCOUNTER — Encounter: Payer: Self-pay | Admitting: Family Medicine

## 2016-10-27 VITALS — BP 144/82 | HR 85 | Temp 97.8°F | Ht 61.0 in | Wt 120.0 lb

## 2016-10-27 DIAGNOSIS — R300 Dysuria: Secondary | ICD-10-CM | POA: Diagnosis not present

## 2016-10-27 DIAGNOSIS — E7800 Pure hypercholesterolemia, unspecified: Secondary | ICD-10-CM | POA: Diagnosis not present

## 2016-10-27 DIAGNOSIS — I10 Essential (primary) hypertension: Secondary | ICD-10-CM | POA: Diagnosis not present

## 2016-10-27 DIAGNOSIS — E559 Vitamin D deficiency, unspecified: Secondary | ICD-10-CM

## 2016-10-27 LAB — MICROSCOPIC EXAMINATION
BACTERIA UA: NONE SEEN
RBC, UA: NONE SEEN /hpf (ref 0–?)
Renal Epithel, UA: NONE SEEN /hpf

## 2016-10-27 LAB — URINALYSIS, COMPLETE
BILIRUBIN UA: NEGATIVE
GLUCOSE, UA: NEGATIVE
Ketones, UA: NEGATIVE
Nitrite, UA: NEGATIVE
PROTEIN UA: NEGATIVE
RBC, UA: NEGATIVE
Specific Gravity, UA: 1.01 (ref 1.005–1.030)
UUROB: 0.2 mg/dL (ref 0.2–1.0)
pH, UA: 7 (ref 5.0–7.5)

## 2016-10-27 MED ORDER — CLONAZEPAM 0.5 MG PO TABS: 0.5000 mg | ORAL_TABLET | Freq: Two times a day (BID) | ORAL | 4 refills | Status: DC | PRN

## 2016-10-27 NOTE — Patient Instructions (Addendum)
Medicare Annual Wellness Visit  [facility] and the medical providers at [facility] strive to bring you the best medical care.  In doing so we not only want to address your current medical conditions and concerns but also to detect new conditions early and prevent illness, disease and health-related problems.    Medicare offers a yearly Wellness Visit which allows our clinical staff to assess your need for preventative services including immunizations, lifestyle education, counseling to decrease risk of preventable diseases and screening for fall risk and other medical concerns.    This visit is provided free of charge (no copay) for all Medicare recipients. The clinical pharmacists at [facility] have begun to conduct these Wellness Visits which will also include a thorough review of all your medications.    As you primary medical provider recommend that you make an appointment for your Annual Wellness Visit if you have not done so already this year.  You may set up this appointment before you leave today or you may call back ([phone]) and schedule an appointment.  Please make sure when you call that you mention that you are scheduling your Annual Wellness Visit with the clinical pharmacist so that the appointment may be made for the proper length of time.     Continue current medications. Continue good therapeutic lifestyle changes which include good diet and exercise. Fall precautions discussed with patient. If an FOBT was given today- please return it to our front desk. If you are over 81 years old - you may need Prevnar 78 or the adult Pneumonia vaccine.  **Flu shots are available--- please call and schedule a FLU-CLINIC appointment**  After your visit with [place] today you will receive a survey in the mail or online from [employer] regarding your care with [place]. Please take a moment to fill this out. Your feedback is very  important to [place] as you can help [place] better understand your patient needs as well as improve your experience and satisfaction. WE CARE ABOUT YOU!!!  Continue to drink plenty of fluids and stay well hydrated Follow-up with gastroenterology as planned Follow-up with ophthalmology as planned

## 2016-10-27 NOTE — Progress Notes (Signed)
Subjective:    Patient ID: Rachel Walton, female    DOB: 11/05/1929, 83 y.o.   MRN: [id number]  HPI Pt here for follow up and management of chronic medical problems which includes hyperlipidemia and hypertension. She is taking medication regularly.The patient complains of being tense and anxious today. And this goes for both she and her husband. Strip she is requesting a refill on her clonazepam. The blood pressures are elevated with our cuff reading and her cuff reading. She's been followed recently by the gastroenterologist because of lymphocytic colitis. She also has hypertension and depression. She is at high risk of fracture because of osteopenia. She has a history also of recurrent urinary tract infections. Blood pressure readings from home are good. She is very stressed today. The patient seems to be calmer now. She is upset about her eyes and plans to change eye doctors again and go back to Dr. Sabra Heck. She denies any chest pain or shortness of breath. She denies any trouble with nausea vomiting diarrhea or blood in the stool and is doing better with her colitis situation since seeing the gastroenterologist. She is passing her water without problems. Home blood pressure readings were reviewed and will be scanned into the record     Patient Active Problem List   Diagnosis Date Noted  . Osteopenia with high risk of fracture 10/21/2016  . Basal cell carcinoma of nose 02/05/2016  . Lymphocytic colitis 12/20/2015  . Barrett's esophagus 12/20/2015  . 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 Depression 11/07/2015  . Pain in the chest   . Chest pain 10/09/2015  . Hypokalemia 10/09/2015  . Anemia 10/09/2015  . Esophageal stricture 12/11/2013  . Allergic rhinitis due to pollen 12/11/2013  . Essential hypertension 12/11/2013  . Generalized anxiety disorder 08/21/2013  . Gall stones 06/15/2013  . Hyperlipemia 11/21/2012  . Tremor   . Postmenopausal   . Colon polyp   . Osteopenia of the elderly    . Kyphosis   . Recurrent UTI   . Atrophic vaginitis   . Diverticulosis    Outpatient Encounter Prescriptions as of 10/27/2016  Medication Sig  . Calcium Carbonate-Vitamin D (CALCIUM 600+D) 600-200 MG-UNIT TABS Take 1 tablet by mouth daily. Take with a meal (Patient taking differently: Take 2 tablets by mouth daily. Take with a meal)  . clonazePAM (KLONOPIN) 0.5 MG tablet Take 1 tablet (0.5 mg total) by mouth 2 (two) times daily as needed (stress).  . fish oil-omega-3 fatty acids 1000 MG capsule Take 2 g by mouth daily.  . fluticasone (FLONASE) 50 MCG/ACT nasal spray Place 1 spray into both nostrils daily as needed for rhinitis.   Marland Kitchen mesalamine (LIALDA) 1.2 g EC tablet Take 2 tablets (2.4 g total) by mouth daily with breakfast. (Patient taking differently: Take 1.2 g by mouth daily with breakfast. )  . Multiple Vitamins-Minerals (ICAPS AREDS FORMULA PO) Take 1 tablet by mouth 2 (two) times daily before a meal.   . potassium chloride (K-DUR) 10 MEQ tablet Take 1 Tablet by mouth once daily  . rosuvastatin (CRESTOR) 5 MG tablet Take 1 tablet (5 mg total) by mouth 2 (two) times a week.  . Vitamin D, Cholecalciferol, 1000 units TABS Take 2,000 Units by mouth 2 (two) times a week.   No facility-administered encounter medications on file as of 10/27/2016.      Review of Systems  Constitutional: Negative.   HENT: Negative.   Eyes: Negative.   Respiratory: Negative.   Cardiovascular: Negative.   Gastrointestinal:  Negative.   Endocrine: Negative.   Genitourinary: Negative.   Musculoskeletal: Negative.   Skin: Negative.   Allergic/Immunologic: Negative.   Neurological: Negative.   Hematological: Negative.   Psychiatric/Behavioral: Negative.        Objective:   Physical Exam  Constitutional: She is oriented to person, place, and time. She appears well-developed and well-nourished. No distress.  The patient has seemed to calm down some since she was initially put in the room.  HENT:    Head: Normocephalic and atraumatic.  Right Ear: External ear normal.  Left Ear: External ear normal.  Nose: Nose normal.  Mouth/Throat: Oropharynx is clear and moist. No oropharyngeal exudate.  Eyes: Conjunctivae and EOM are normal. Pupils are equal, round, and reactive to light. Right eye exhibits no discharge. Left eye exhibits no discharge. No scleral icterus.  Neck: Normal range of motion. Neck supple. No thyromegaly present.  No bruits thyromegaly or anterior cervical adenopathy  Cardiovascular: Normal rate, regular rhythm, normal heart sounds and intact distal pulses.   No murmur heard. Heart has a regular rate and rhythm at 72/m  Pulmonary/Chest: Effort normal and breath sounds normal. No respiratory distress. She has no wheezes. She has no rales.  Clear anteriorly and posteriorly  Abdominal: Soft. Bowel sounds are normal. She exhibits no mass. There is no tenderness. There is no rebound and no guarding.  No liver or spleen enlargement no masses no tenderness and no bruits  Musculoskeletal: Normal range of motion. She exhibits no edema.  Lymphadenopathy:    She has no cervical adenopathy.  Neurological: She is alert and oriented to person, place, and time. She has normal reflexes. No cranial nerve deficit.  Skin: Skin is warm and dry. No rash noted.  Psychiatric: She has a normal mood and affect. Her behavior is normal. Judgment and thought content normal.  Nursing note and vitals reviewed.  BP (!) 188/79 (BP Location: Left Arm)   Pulse 85   Temp 97.8 F (36.6 C) (Oral)   Ht 5' 1"  (1.549 m)   Wt 120 lb (54.4 kg)   BMI 22.67 kg/m         Assessment & Plan:  1. Vitamin D deficiency -Continue current treatment pending results of lab work - CBC with Differential/Platelet - VITAMIN D 25 Hydroxy (Vit-D Deficiency, Fractures)  2. Essential hypertension -The blood pressure remained elevated with the additional reading that idea but was improving as the patient seemed to be  more calm down. Her home blood pressure readings were good. - CBC with Differential/Platelet - BMP8+EGFR - Hepatic function panel  3. Pure hypercholesterolemia -Continue current treatment pending results of lab work - CBC with Differential/Platelet - Lipid panel  4. Dysuria Check urinalysis and treat as necessary - Urinalysis, Complete - Urine Culture  Meds ordered this encounter  Medications  . clonazePAM (KLONOPIN) 0.5 MG tablet    Sig: Take 1 tablet (0.5 mg total) by mouth 2 (two) times daily as needed (stress).    Dispense:  60 tablet    Refill:  4   Patient Instructions                       Medicare Annual Wellness Visit  [facility] and the medical providers at [facility] strive to bring you the best medical care.  In doing so we not only want to address your current medical conditions and concerns but also to detect new conditions early and prevent illness, disease and health-related  problems.    Medicare offers a yearly Wellness Visit which allows our clinical staff to assess your need for preventative services including immunizations, lifestyle education, counseling to decrease risk of preventable diseases and screening for fall risk and other medical concerns.    This visit is provided free of charge (no copay) for all Medicare recipients. The clinical pharmacists at [facility] have begun to conduct these Wellness Visits which will also include a thorough review of all your medications.    As you primary medical provider recommend that you make an appointment for your Annual Wellness Visit if you have not done so already this year.  You may set up this appointment before you leave today or you may call back ([phone]) and schedule an appointment.  Please make sure when you call that you mention that you are scheduling your Annual Wellness Visit with the clinical pharmacist so that the appointment may be made for the proper  length of time.     Continue current medications. Continue good therapeutic lifestyle changes which include good diet and exercise. Fall precautions discussed with patient. If an FOBT was given today- please return it to our front desk. If you are over 37 years old - you may need Prevnar 55 or the adult Pneumonia vaccine.  **Flu shots are available--- please call and schedule a FLU-CLINIC appointment**  After your visit with [place] today you will receive a survey in the mail or online from [employer] regarding your care with [place]. Please take a moment to fill this out. Your feedback is very important to [place] as you can help [place] better understand your patient needs as well as improve your experience and satisfaction. WE CARE ABOUT YOU!!!  Continue to drink plenty of fluids and stay well hydrated Follow-up with gastroenterology as planned Follow-up with ophthalmology as planned   Arrie Senate MD

## 2016-10-28 ENCOUNTER — Other Ambulatory Visit: Payer: Self-pay

## 2016-10-28 LAB — HEPATIC FUNCTION PANEL
ALBUMIN: 4.2 g/dL (ref 3.5–4.7)
ALK PHOS: 54 IU/L (ref 39–117)
ALT: 17 IU/L (ref 0–32)
AST: 24 IU/L (ref 0–40)
BILIRUBIN TOTAL: 0.4 mg/dL (ref 0.0–1.2)
Bilirubin, Direct: 0.1 mg/dL (ref 0.00–0.40)
Total Protein: 6.5 g/dL (ref 6.0–8.5)

## 2016-10-28 LAB — BMP8+EGFR
BUN / CREAT RATIO: 8 — AB (ref 12–28)
BUN: 7 mg/dL — ABNORMAL LOW (ref 8–27)
CO2: 24 mmol/L (ref 20–29)
Calcium: 9.5 mg/dL (ref 8.7–10.3)
Chloride: 101 mmol/L (ref 96–106)
Creatinine, Ser: 0.83 mg/dL (ref 0.57–1.00)
GFR calc non Af Amer: 65 mL/min/{1.73_m2} (ref >59)
GFR, EST AFRICAN AMERICAN: 75 mL/min/{1.73_m2} (ref >59)
GLUCOSE: 82 mg/dL (ref 65–99)
POTASSIUM: 4 mmol/L (ref 3.5–5.2)
SODIUM: 141 mmol/L (ref 134–144)

## 2016-10-28 LAB — CBC WITH DIFFERENTIAL/PLATELET
BASOS: 1 %
Basophils Absolute: 0.1 10*3/uL (ref 0.0–0.2)
EOS (ABSOLUTE): 0.2 10*3/uL (ref 0.0–0.4)
Eos: 3 %
Hematocrit: 41.8 % (ref 34.0–46.6)
Hemoglobin: 13.5 g/dL (ref 11.1–15.9)
IMMATURE GRANS (ABS): 0 10*3/uL (ref 0.0–0.1)
Immature Granulocytes: 0 %
LYMPHS ABS: 2.8 10*3/uL (ref 0.7–3.1)
LYMPHS: 52 %
MCH: 28.7 pg (ref 26.6–33.0)
MCHC: 32.3 g/dL (ref 31.5–35.7)
MCV: 89 fL (ref 79–97)
MONOS ABS: 0.4 10*3/uL (ref 0.1–0.9)
Monocytes: 8 %
NEUTROS ABS: 1.9 10*3/uL (ref 1.4–7.0)
Neutrophils: 36 %
PLATELETS: 265 10*3/uL (ref 150–379)
RBC: 4.71 x10E6/uL (ref 3.77–5.28)
RDW: 13.5 % (ref 12.3–15.4)
WBC: 5.4 10*3/uL (ref 3.4–10.8)

## 2016-10-28 LAB — LIPID PANEL
CHOL/HDL RATIO: 2.5 ratio (ref 0.0–4.4)
Cholesterol, Total: 277 mg/dL — ABNORMAL HIGH (ref 100–199)
HDL: 112 mg/dL (ref >39)
LDL CALC: 137 mg/dL — AB (ref 0–99)
TRIGLYCERIDES: 140 mg/dL (ref 0–149)
VLDL Cholesterol Cal: 28 mg/dL (ref 5–40)

## 2016-10-28 LAB — VITAMIN D 25 HYDROXY (VIT D DEFICIENCY, FRACTURES): Vit D, 25-Hydroxy: 38.3 ng/mL (ref 30.0–100.0)

## 2016-10-28 MED ORDER — ROSUVASTATIN CALCIUM 5 MG PO TABS: 5.0000 mg | ORAL_TABLET | Freq: Every day | ORAL | 3 refills | Status: DC

## 2016-10-30 LAB — URINE CULTURE

## 2016-11-02 ENCOUNTER — Other Ambulatory Visit: Payer: Self-pay | Admitting: *Deleted

## 2016-11-02 ENCOUNTER — Telehealth: Payer: Self-pay | Admitting: Family Medicine

## 2016-11-02 MED ORDER — CIPROFLOXACIN HCL 500 MG PO TABS: 500.0000 mg | ORAL_TABLET | Freq: Two times a day (BID) | ORAL | 0 refills | Status: DC

## 2016-11-02 NOTE — Telephone Encounter (Signed)
Called and aware that she has had this in the past

## 2016-11-25 ENCOUNTER — Encounter (INDEPENDENT_AMBULATORY_CARE_PROVIDER_SITE_OTHER): Payer: Self-pay

## 2016-11-25 ENCOUNTER — Telehealth: Payer: Self-pay | Admitting: Family Medicine

## 2016-11-25 ENCOUNTER — Encounter: Payer: Self-pay | Admitting: Family Medicine

## 2016-11-25 ENCOUNTER — Ambulatory Visit (INDEPENDENT_AMBULATORY_CARE_PROVIDER_SITE_OTHER): Payer: Medicare Other | Admitting: Family Medicine

## 2016-11-25 VITALS — BP 137/64 | HR 67 | Temp 96.7°F | Ht 61.0 in | Wt 122.2 lb

## 2016-11-25 DIAGNOSIS — B029 Zoster without complications: Secondary | ICD-10-CM

## 2016-11-25 DIAGNOSIS — R300 Dysuria: Secondary | ICD-10-CM

## 2016-11-25 LAB — MICROSCOPIC EXAMINATION: Renal Epithel, UA: NONE SEEN /hpf

## 2016-11-25 LAB — URINALYSIS, COMPLETE
Bilirubin, UA: NEGATIVE
GLUCOSE, UA: NEGATIVE
Ketones, UA: NEGATIVE
Leukocytes, UA: NEGATIVE
Nitrite, UA: NEGATIVE
PROTEIN UA: NEGATIVE
Specific Gravity, UA: <1.005 — ABNORMAL LOW (ref 1.005–1.030)
UUROB: 0.2 mg/dL (ref 0.2–1.0)
pH, UA: 6.5 (ref 5.0–7.5)

## 2016-11-25 MED ORDER — VALACYCLOVIR HCL 1 G PO TABS: 1000.0000 mg | ORAL_TABLET | Freq: Three times a day (TID) | ORAL | 0 refills | Status: DC

## 2016-11-25 NOTE — Progress Notes (Signed)
Subjective:    Patient ID: Rachel Walton, female    DOB: 21-Oct-1929, 83 y.o.   MRN: [id number]  HPI 83 year old female who presents with symptoms of possible spider bite. This occurred after she had been in her garden. Pain has kept her up at night for the past 2 nights.  Patient Active Problem List   Diagnosis Date Noted  . Osteopenia with high risk of fracture 10/21/2016  . Basal cell carcinoma of nose 02/05/2016  . Lymphocytic colitis 12/20/2015  . Barrett's esophagus 12/20/2015  . 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 Depression 11/07/2015  . Pain in the chest   . Chest pain 10/09/2015  . Hypokalemia 10/09/2015  . Anemia 10/09/2015  . Esophageal stricture 12/11/2013  . Allergic rhinitis due to pollen 12/11/2013  . Essential hypertension 12/11/2013  . Generalized anxiety disorder 08/21/2013  . Gall stones 06/15/2013  . Hyperlipemia 11/21/2012  . Tremor   . Postmenopausal   . Colon polyp   . Osteopenia of the elderly   . Kyphosis   . Recurrent UTI   . Atrophic vaginitis   . Diverticulosis    Outpatient Encounter Prescriptions as of 11/25/2016  Medication Sig  . Calcium Carbonate-Vitamin D (CALCIUM 600+D) 600-200 MG-UNIT TABS Take 1 tablet by mouth daily. Take with a meal (Patient taking differently: Take 2 tablets by mouth daily. Take with a meal)  . ciprofloxacin (CIPRO) 500 MG tablet Take 1 tablet (500 mg total) by mouth 2 (two) times daily.  . clonazePAM (KLONOPIN) 0.5 MG tablet Take 1 tablet (0.5 mg total) by mouth 2 (two) times daily as needed (stress).  . fish oil-omega-3 fatty acids 1000 MG capsule Take 2 g by mouth daily.  . fluticasone (FLONASE) 50 MCG/ACT nasal spray Place 1 spray into both nostrils daily as needed for rhinitis.   Marland Kitchen mesalamine (LIALDA) 1.2 g EC tablet Take 2 tablets (2.4 g total) by mouth daily with breakfast. (Patient taking differently: Take 1.2 g by mouth daily with breakfast. )  . Multiple Vitamins-Minerals (ICAPS AREDS FORMULA PO) Take 1 tablet  by mouth 2 (two) times daily before a meal.   . potassium chloride (K-DUR) 10 MEQ tablet Take 1 Tablet by mouth once daily  . rosuvastatin (CRESTOR) 5 MG tablet Take 1 tablet (5 mg total) by mouth daily.  . Vitamin D, Cholecalciferol, 1000 units TABS Take 2,000 Units by mouth 2 (two) times a week.   No facility-administered encounter medications on file as of 11/25/2016.       Review of Systems  Constitutional: Negative.   Gastrointestinal: Positive for abdominal pain.  All other systems reviewed and are negative.      Objective:   Physical Exam  Constitutional: She appears well-developed and well-nourished.  Cardiovascular: Normal rate.   Pulmonary/Chest: Effort normal.  Skin:  There are no punctate lesions suggestive of bite but there is a dermatomal distribution on the right midabdomen. There are some small vesicular areas.    BP 137/64   Pulse 67   Temp (!) 96.7 F (35.9 C) (Oral)   Ht 5\' 1"  (1.549 m)   Wt 122 lb 3.2 oz (55.4 kg)   BMI 23.09 kg/m         Assessment & Plan:  Probable herpes zoster. We'll treat with acyclovir 1 g 3 times a day for 1 week. May use calamine lotion as well as Tylenol Extra Strength for symptomatic relief  Addendum: Review of urine specimen shows 3-10 white cells. Patient is having some symptoms of  what sounds like a stress incontinence. Have discussed ask her to empty her bladder on a more regular basis but to continue drinking plenty of fluids

## 2016-11-25 NOTE — Telephone Encounter (Signed)
This has been sent in  

## 2016-11-30 ENCOUNTER — Telehealth: Payer: Self-pay | Admitting: Family Medicine

## 2016-12-01 NOTE — Telephone Encounter (Signed)
Returned patient's phone call.  Patient states that she has shingles and has finished all of the Valtrex and would like to come in and be seen again.  Offered appointment to be seen today by Dr. Livia Snellen, [name] or Dr. Warrick Parisian and patient declines.  Patient only wants to see Dr. [name].  Appointment made

## 2016-12-02 ENCOUNTER — Ambulatory Visit (INDEPENDENT_AMBULATORY_CARE_PROVIDER_SITE_OTHER): Payer: Medicare Other | Admitting: Family Medicine

## 2016-12-02 ENCOUNTER — Encounter: Payer: Self-pay | Admitting: Family Medicine

## 2016-12-02 VITALS — BP 170/80 | HR 70 | Temp 98.3°F | Ht 61.0 in | Wt 121.0 lb

## 2016-12-02 DIAGNOSIS — I10 Essential (primary) hypertension: Secondary | ICD-10-CM

## 2016-12-02 DIAGNOSIS — L259 Unspecified contact dermatitis, unspecified cause: Secondary | ICD-10-CM

## 2016-12-02 DIAGNOSIS — R251 Tremor, unspecified: Secondary | ICD-10-CM | POA: Diagnosis not present

## 2016-12-02 DIAGNOSIS — B029 Zoster without complications: Secondary | ICD-10-CM | POA: Diagnosis not present

## 2016-12-02 NOTE — Progress Notes (Signed)
Subjective:    Patient ID: Rachel Walton, female    DOB: May 14, 1929, 83 y.o.   MRN: [id number]  HPI Patient here today for follow up on shingles.The patient also complains of some right ear pain. She's been taking the Valtrex and has only one left. She continues to have problems with her tremor and she wants to discuss the medication that she is taking for this. The patient's ear irritation is above the ear on the right side. She says her rash is improving. She is calm and pleasant today. She denies any other problems for chest pain nausea vomiting diarrhea or blood in the stool.     Patient Active Problem List   Diagnosis Date Noted  . Osteopenia with high risk of fracture 10/21/2016  . Basal cell carcinoma of nose 02/05/2016  . Lymphocytic colitis 12/20/2015  . Barrett's esophagus 12/20/2015  . 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 Depression 11/07/2015  . Pain in the chest   . Chest pain 10/09/2015  . Hypokalemia 10/09/2015  . Anemia 10/09/2015  . Esophageal stricture 12/11/2013  . Allergic rhinitis due to pollen 12/11/2013  . Essential hypertension 12/11/2013  . Generalized anxiety disorder 08/21/2013  . Gall stones 06/15/2013  . Hyperlipemia 11/21/2012  . Tremor   . Postmenopausal   . Colon polyp   . Osteopenia of the elderly   . Kyphosis   . Recurrent UTI   . Atrophic vaginitis   . Diverticulosis    Outpatient Encounter Prescriptions as of 12/02/2016  Medication Sig  . Calcium Carbonate-Vitamin D (CALCIUM 600+D) 600-200 MG-UNIT TABS Take 1 tablet by mouth daily. Take with a meal (Patient taking differently: Take 2 tablets by mouth daily. Take with a meal)  . clonazePAM (KLONOPIN) 0.5 MG tablet Take 1 tablet (0.5 mg total) by mouth 2 (two) times daily as needed (stress).  . fish oil-omega-3 fatty acids 1000 MG capsule Take 2 g by mouth daily.  . fluticasone (FLONASE) 50 MCG/ACT nasal spray Place 1 spray into both nostrils daily as needed for rhinitis.   Marland Kitchen mesalamine  (LIALDA) 1.2 g EC tablet Take 2 tablets (2.4 g total) by mouth daily with breakfast. (Patient taking differently: Take 1.2 g by mouth daily with breakfast. )  . Multiple Vitamins-Minerals (ICAPS AREDS FORMULA PO) Take 1 tablet by mouth 2 (two) times daily before a meal.   . potassium chloride (K-DUR) 10 MEQ tablet Take 1 Tablet by mouth once daily  . rosuvastatin (CRESTOR) 5 MG tablet Take 1 tablet (5 mg total) by mouth daily.  . valACYclovir (VALTREX) 1000 MG tablet Take 1 tablet (1,000 mg total) by mouth 3 (three) times daily.  . Vitamin D, Cholecalciferol, 1000 units TABS Take 2,000 Units by mouth 2 (two) times a week.   No facility-administered encounter medications on file as of 12/02/2016.       Review of Systems  Constitutional: Negative.   HENT: Negative.   Eyes: Negative.   Respiratory: Negative.   Cardiovascular: Negative.   Gastrointestinal: Negative.   Endocrine: Negative.   Genitourinary: Negative.   Musculoskeletal: Negative.   Skin: Positive for rash (abdomen and also skin of right ear is painful).  Allergic/Immunologic: Negative.   Neurological: Negative.   Hematological: Negative.   Psychiatric/Behavioral: Negative.        Objective:   Physical Exam  Constitutional: She is oriented to person, place, and time. She appears well-developed and well-nourished. No distress.  Pleasant and alert  HENT:  Head: Normocephalic and atraumatic.  Right Ear:  External ear normal.  Left Ear: External ear normal.  Mouth/Throat: Oropharynx is clear and moist. No oropharyngeal exudate.  Slight nasal congestion  Eyes: Pupils are equal, round, and reactive to light. Conjunctivae and EOM are normal. Right eye exhibits no discharge. Left eye exhibits no discharge. No scleral icterus.  Neck: Normal range of motion. Neck supple. No thyromegaly present.  Cardiovascular: Normal rate, regular rhythm and normal heart sounds.   No murmur heard. The heart is regular at 72/m    Pulmonary/Chest: Effort normal and breath sounds normal. No respiratory distress. She has no wheezes. She has no rales.  Abdominal: Soft. Bowel sounds are normal. She exhibits no mass. There is no tenderness. There is no rebound and no guarding.  Musculoskeletal: Normal range of motion. She exhibits no edema.  Lymphadenopathy:    She has no cervical adenopathy.  Neurological: She is alert and oriented to person, place, and time.  Skin: Skin is warm and dry. Rash noted. No erythema.  The rash on her abdomen at her right side appears to be resolving and fading at this point in time and less erythematous than previously.  Psychiatric: She has a normal mood and affect. Her behavior is normal. Judgment and thought content normal.  Nursing note and vitals reviewed.   BP (!) 161/67 (BP Location: Left Arm)   Pulse 70   Temp 98.3 F (36.8 C) (Oral)   Ht 5\' 1"  (1.549 m)   Wt 121 lb (54.9 kg)   BMI 22.86 kg/m   Repeat blood pressure 170/80 right arm sitting     Assessment & Plan:  1. Herpes zoster without complication -Shingles appears to be resolving and her discomfort seems to be diminishing. She should continue and complete all of the Valtrex that was prescribed.  2. Contact dermatitis of external ear -Use cortisone 10 sparingly to the area above the right ear couple times daily and avoid shampoo and conditioner that has scented fragrances.  3. Tremor -Continue with beta blocker 1/2-1/4 pill twice daily as needed  4. Essential hypertension -Watch ibuprofen and take more Tylenol for aches pains and fever. Bring blood pressures in for review in a couple weeks and have nurse recheck blood pressure at that time.  Patient Instructions  Elizebeth Koller and complete the Valtrex Take the propranolol 1/2-1/4 pill twice daily as needed for tremor Continue lorazepam as needed for anxiety Return to the office for a blood pressure check in a couple weeks and bring home blood pressure readings with you  at that time Avoid ibuprofen as this can raise the blood pressure and irritate the stomach and take Tylenol if needed for aches pains and fever Use cortisone 10 sparingly a couple times a day to the area above the right ear. Use scent free hair shampoos and conditioners  Arrie Senate MD

## 2016-12-02 NOTE — Patient Instructions (Addendum)
Finish and complete the Valtrex Take the propranolol 1/2-1/4 pill twice daily as needed for tremor Continue lorazepam as needed for anxiety Return to the office for a blood pressure check in a couple weeks and bring home blood pressure readings with you at that time Avoid ibuprofen as this can raise the blood pressure and irritate the stomach and take Tylenol if needed for aches pains and fever Use cortisone 10 sparingly a couple times a day to the area above the right ear. Use scent free hair shampoos and conditioners

## 2016-12-22 ENCOUNTER — Encounter: Payer: Self-pay | Admitting: *Deleted

## 2016-12-23 ENCOUNTER — Other Ambulatory Visit: Payer: Self-pay | Admitting: Family Medicine

## 2016-12-23 DIAGNOSIS — E876 Hypokalemia: Secondary | ICD-10-CM

## 2017-01-04 ENCOUNTER — Ambulatory Visit: Payer: Medicare Other | Admitting: *Deleted

## 2017-01-08 ENCOUNTER — Ambulatory Visit (INDEPENDENT_AMBULATORY_CARE_PROVIDER_SITE_OTHER): Payer: Medicare Other | Admitting: *Deleted

## 2017-01-08 ENCOUNTER — Encounter: Payer: Self-pay | Admitting: *Deleted

## 2017-01-08 VITALS — BP 142/61 | HR 55 | Ht 62.0 in | Wt 124.0 lb

## 2017-01-08 DIAGNOSIS — Z0000 Encounter for general adult medical examination without abnormal findings: Secondary | ICD-10-CM | POA: Diagnosis not present

## 2017-01-08 DIAGNOSIS — Z23 Encounter for immunization: Secondary | ICD-10-CM

## 2017-01-08 NOTE — Patient Instructions (Addendum)
Rachel Walton , Thank you for taking time to come for your Medicare Wellness Visit. I appreciate your ongoing commitment to your health goals. Please review the following plan we discussed and let me know if I can assist you in the future.   These are the goals we discussed: Goals    . Exercise 3x per week (30 min per time)       This is a list of the screening recommended for you and due dates:  Health Maintenance  Topic Date Due  . Tetanus Vaccine  05/11/2016  . Flu Shot  12/09/2016  . Mammogram  10/26/2017  . DEXA scan (bone density measurement)  06/22/2018  . Pneumonia vaccines  Completed    Diphtheria/Tetanus Toxoids; Pertussis Vaccine, DTP injection What is this medicine? DIPHTHERIA and TETANUS TOXOIDS; PERTUSSIS VACCINE (dif THEER ee uh and TET n [place] TOK soids; per TUS iss VAK seen) is used to prevent diphtheria, tetanus, and pertussis infections. This medicine may be used for other purposes; ask your health care provider or pharmacist if you have questions. COMMON BRAND NAME(S): Adacel, Boostrix, Certiva, Daptacel, Infanrix, Tripedia What should I tell my health care provider before I take this medicine? They need to know if you have any of these conditions: -blood disorders like hemophilia -fever or infection -immune system problems -neurologic disease -seizures -an unusual or allergic reaction to vaccines, thimerosal, latex, other medicines, foods, dyes, or preservatives -pregnant or trying to get pregnant -breast-feeding How should I use this medicine? This vaccine is for injection into a muscle. It is given by a health care professional. A copy of Vaccine Information Statements will be given before each vaccination. Read this sheet carefully each time. The sheet may change frequently. Talk to your pediatrician regarding the use of this vaccine in children. While the DTP vaccine may be given to children ages 108 weeks to 7 years and the Tdap vaccine may be given to children  at least 75 years old, precautions do apply. Overdosage: If you think you have taken too much of this medicine contact a poison control center or emergency room at once. NOTE: This medicine is only for you. Do not share this medicine with others. What if I miss a dose? It is important not to miss your dose. Call your doctor or health care professional if you are unable to keep an appointment. What may interact with this medicine? -immune globulin -medicines that suppress your immune function like adalimumab, anakinra, infliximab -medicines to treat cancer -medicines that treat or prevent blood clots like warfarin, enoxaparin, and dalteparin -steroid medicines like prednisone or cortisone This list may not describe all possible interactions. Give your health care provider a list of all the medicines, herbs, non-prescription drugs, or dietary supplements you use. Also tell them if you smoke, drink alcohol, or use illegal drugs. Some items may interact with your medicine. What should I watch for while using this medicine? See your health care provider for all shots of this vaccine as directed. To have protection from infection, you must have 3 shots of this vaccine plus boosters as needed. Tell your doctor right away if you have any serious or unusual side effects after getting this vaccine. What side effects may I notice from receiving this medicine? Side effects that you should report to your doctor or health care professional as soon as possible: -allergic reactions like skin rash, itching or hives, swelling of the face, lips, or tongue -breathing problems -fever of 103 degrees F or  more -flu-like symptoms -inconsolable crying -infection -pain, tingling, numbness in the hands or feet -seizures -swelling of arm or leg that was injected -unusually weak or tired Side effects that usually do not require medical attention (report to your doctor or health care professional if they continue or are  bothersome): -fussy, irritable -loss of appetite -low-grade fever -pain, tenderness, redness, swelling, or a 'knot' at site where injected -vomiting This list may not describe all possible side effects. Call your doctor for medical advice about side effects. You may report side effects to FDA at 1-800-FDA-1088. Where should I keep my medicine? This drug is given in a hospital or clinic and will not be stored at home. NOTE: This sheet is a summary. It may not cover all possible information. If you have questions about this medicine, talk to your doctor, pharmacist, or health care provider.  2018 Elsevier/Gold Standard (2015-05-30 12:43:42)

## 2017-01-12 NOTE — Progress Notes (Signed)
Subjective:   Rachel Walton is a 83 y.o. female who presents for an Initial Medicare Annual Wellness Visit. Rachel Walton lives at home with her husband. She has two adult sons and one adult son that is deceased. She has 4 grandchildren. She is a retired and enjoys sewing and watching televisions. She is also active around her home and yard and does most of the mowing.   Review of Systems    Reports that health is about the same as last year.   Cardiac Risk Factors include: sedentary lifestyle;dyslipidemia;hypertension  Urinary: Mild stress incontinence   Other systems negative today    Objective:    Today's Vitals   01/08/17 1153  BP: (!) 142/61  Pulse: (!) 55  Weight: 124 lb (56.2 kg)  Height: 5\' 2"  (1.575 m)   Body mass index is 22.68 kg/m.   Current Medications (verified) Outpatient Encounter Prescriptions as of 01/08/2017  Medication Sig  . Calcium Carbonate-Vitamin D (CALCIUM 600+D) 600-200 MG-UNIT TABS Take 1 tablet by mouth daily. Take with a meal (Patient taking differently: Take 2 tablets by mouth daily. Take with a meal)  . clonazePAM (KLONOPIN) 0.5 MG tablet Take 1 tablet (0.5 mg total) by mouth 2 (two) times daily as needed (stress).  . diphenhydramine-acetaminophen (TYLENOL PM) 25-500 MG TABS tablet Take 1 tablet by mouth at bedtime as needed.  . fish oil-omega-3 fatty acids 1000 MG capsule Take 2 g by mouth daily.  . fluticasone (FLONASE) 50 MCG/ACT nasal spray Place 1 spray into both nostrils daily as needed for rhinitis.   Marland Kitchen mesalamine (LIALDA) 1.2 g EC tablet Take 2 tablets (2.4 g total) by mouth daily with breakfast. (Patient taking differently: Take 1.2 g by mouth daily with breakfast. )  . Multiple Vitamins-Minerals (ICAPS AREDS FORMULA PO) Take 1 tablet by mouth 2 (two) times daily before a meal.   . potassium chloride (K-DUR) 10 MEQ tablet Take 1 Tablet by mouth once daily  . rosuvastatin (CRESTOR) 5 MG tablet Take 1 tablet (5 mg total) by mouth daily.  .  Vitamin D, Cholecalciferol, 1000 units TABS Take 2,000 Units by mouth 2 (two) times a week.  . [DISCONTINUED] valACYclovir (VALTREX) 1000 MG tablet Take 1 tablet (1,000 mg total) by mouth 3 (three) times daily.   No facility-administered encounter medications on file as of 01/08/2017.     Allergies (verified) Alendronate sodium; Amoxicillin; Budesonide; Cephalexin; Prednisone; Sudafed [pseudoephedrine hcl]; Sulfa antibiotics; Toprol xl [metoprolol succinate]; and Trimethoprim   History: Past Medical History:  Diagnosis Date  . Acute gastric ulcer   . Allergy   . Anxiety   . Atrophic vaginitis   . Barrett's esophagus   . Basal cell carcinoma of nose     under nose  . Cataract   . Depression   . Diverticulosis   . Diverticulosis   . Esophageal stricture   . Esophageal ulcer   . Fatty liver   . GERD (gastroesophageal reflux disease)   . Heart murmur   . Hiatal hernia   . Hx of adenomatous colonic polyps   . Hyperlipidemia   . Hypertension   . Hypokalemia   . IBS (irritable bowel syndrome)   . Kyphosis   . Lymphocytic colitis   . Macular degeneration    "?wet or dry; ? both eyes"  . 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 Osteopenia of the elderly   . Pneumonia X 1  . Postmenopausal   . Recurrent UTI   .  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 Tremor   . Vitamin D deficiency    Past Surgical History:  Procedure Laterality Date  . ABDOMINAL HYSTERECTOMY  1980  . APPENDECTOMY    . BACK SURGERY    . BREAST BIOPSY Left   . CATARACT EXTRACTION W/ INTRAOCULAR LENS  IMPLANT, BILATERAL Bilateral   . COLONOSCOPY    . ESOPHAGOGASTRODUODENOSCOPY (EGD) WITH ESOPHAGEAL DILATION  X 1  . EYE SURGERY    . LUMBAR DISC SURGERY     "fragmented disc"  . POLYPECTOMY    . SQUAMOUS CELL CARCINOMA EXCISION     nose tip   Family History  Problem Relation Age of Onset  . Stroke Mother   . Heart disease Father        MI  . Heart attack Father   . Cancer Sister         Brain tumor  . Hypertension Sister   . Osteoporosis Sister   . Colon cancer Brother   . Heart disease Brother   . Diabetes Son 8  . Diabetes Maternal Uncle   . Diabetes Son 2  . Hypertension Sister   . Breast cancer Sister 60  . Hypertension Sister   . Cancer Sister        breast  . Osteoporosis Sister   . Hypertension Sister   . Hypertension Sister   . Stroke Sister   . Hypertension Sister   . Hypertension Sister   . Colon cancer Unknown        nephew  . Brain cancer Son        deceased  . Cancer Son        brain  . Thyroid cancer Other        niece  . Breast cancer Other   . Breast cancer Other   . Breast cancer Other   . Colon polyps Neg Hx   . Esophageal cancer Neg Hx   . Rectal cancer Neg Hx   . Stomach cancer Neg Hx    Social History   Occupational History  . Retired    Social History Main Topics  . Smoking status: Never Smoker  . Smokeless tobacco: Never Used  . Alcohol use No  . Drug use: No  . Sexual activity: Yes    Tobacco Counseling No tobacco use  Activities of Daily Living In your present state of health, do you have any difficulty performing the following activities: 01/08/2017  Hearing? Y  Vision? Y  Comment Seeing eye doctor soon  Difficulty concentrating or making decisions? N  Walking or climbing stairs? N  Dressing or bathing? N  Doing errands, shopping? N  Preparing Food and eating ? N  Using the Toilet? N  In the past six months, have you accidently leaked urine? Y  Comment some mild urge incontinence  Do you have problems with loss of bowel control? N  Managing your Medications? N  Comment Keeps medications in the original bottles and doesn't use pill box. Says that she keeps up with them well.  Managing your Finances? N  Housekeeping or managing your Housekeeping? N  Some recent data might be hidden    Immunizations and Health Maintenance Immunization History  Administered Date(s) Administered  . Influenza Whole  02/08/2010  . Influenza, High Dose Seasonal PF 04/13/2016  . Influenza,inj,Quad PF,6+ Mos 03/07/2013, 04/12/2014, 03/29/2015  . Pneumococcal Conjugate-13 05/01/2013  . Pneumococcal Polysaccharide-23 02/08/2001  . Td 05/11/2006  . Tdap 01/08/2017  .  Zoster 09/09/2006   Health Maintenance Due  Topic Date Due  . INFLUENZA VACCINE  12/09/2016    Patient Care Team: Chipper Herb, MD as PCP - General (Family Medicine) Alphonsa Overall, MD as Consulting Physician (General Surgery) Irine Seal, MD as Attending Physician (Urology) Jerrell Belfast, MD as Consulting Physician (Otolaryngology) Justice Britain, MD as Consulting Physician (Orthopedic Surgery) Harriett Sine, MD as Consulting Physician (Dermatology) Pyrtle, Lajuan Lines, MD as Consulting Physician (Gastroenterology) Sanda Klein, MD as Consulting Physician (Cardiology)  No hospitalizations, surgeries or ER visits this past year.   Assessment:   This is a routine wellness examination for Tynasia.   Hearing/Vision screen No deficits noted during visit. Last eye exam was 1 year ago.   Dietary issues and exercise activities discussed: Current Exercise Habits: The patient does not participate in regular exercise at present, Exercise limited by: None identified  Diet: Typically eats 3 meals a day. Lunch is light or a snack.    Goals    . Exercise 3x per week (30 min per time)      Depression Screen PHQ 2/9 Scores 01/08/2017 12/02/2016 10/27/2016 09/17/2016 06/09/2016 04/27/2016 04/21/2016  PHQ - 2 Score 0 1 1 0 0 1 1  PHQ- 9 Score - - - - - - -    Fall Risk Fall Risk  01/08/2017 12/02/2016 10/27/2016 09/17/2016 06/09/2016  Falls in the past year? No No No No No  Number falls in past yr: - - - - -  Injury with Fall? - - - - -  Comment - - - - -  Risk for fall due to : - - - - -  Follow up - - - - -    Cognitive Function: MMSE - Mini Mental State Exam 01/08/2017 12/30/2015 08/30/2014  Orientation to time 5 5 5   Orientation to  Place 5 5 5   Registration 3 3 3   Attention/ Calculation 5 5 5   Recall 3 1 3   Language- name 2 objects 2 2 2   Language- repeat 1 1 1   Language- follow 3 step command 3 1 3   Language- read & follow direction 1 1 1   Write a sentence 1 1 1   Copy design 1 1 1   Total score 30 26 30     Normal exam    Screening Tests Health Maintenance  Topic Date Due  . INFLUENZA VACCINE  12/09/2016  . MAMMOGRAM  10/26/2017  . DEXA SCAN  06/22/2018  . TETANUS/TDAP  01/09/2027  . PNA vac Low Risk Adult  Completed      Plan:   Keep f/u with Dr [name] Tdap given today Try to incorporate physical activity into your daily schedule. Aim for 30 minutes daily.  Review Advance Directives with your family. Bring a signed/notarized copy for our records. Consider Prolia for osteoporosis management. Information given.   I have personally reviewed and noted the following in the patient's chart:   . Medical and social history . Use of alcohol, tobacco or illicit drugs  . Current medications and supplements . Functional ability and status . Nutritional status . Physical activity . Advanced directives . List of other physicians . Hospitalizations, surgeries, and ER visits in previous 12 months . Vitals . Screenings to include cognitive, depression, and falls . Referrals and appointments  In addition, I have reviewed and discussed with patient certain preventive protocols, quality metrics, and best practice recommendations. A written personalized care plan for preventive services as well as general preventive health recommendations were provided to  patient.     Chong Sicilian, RN   01/12/2017    I have reviewed and agree with the above AWV documentation.   Laroy Apple, MD [facility] 01/12/2017, 12:04 PM

## 2017-03-11 ENCOUNTER — Encounter: Payer: Self-pay | Admitting: Family Medicine

## 2017-03-11 ENCOUNTER — Ambulatory Visit (INDEPENDENT_AMBULATORY_CARE_PROVIDER_SITE_OTHER): Payer: Medicare Other | Admitting: Family Medicine

## 2017-03-11 ENCOUNTER — Ambulatory Visit (INDEPENDENT_AMBULATORY_CARE_PROVIDER_SITE_OTHER): Payer: Medicare Other

## 2017-03-11 VITALS — BP 152/72 | HR 66 | Temp 98.6°F | Ht 62.0 in | Wt 125.0 lb

## 2017-03-11 DIAGNOSIS — J301 Allergic rhinitis due to pollen: Secondary | ICD-10-CM

## 2017-03-11 DIAGNOSIS — E7800 Pure hypercholesterolemia, unspecified: Secondary | ICD-10-CM | POA: Diagnosis not present

## 2017-03-11 DIAGNOSIS — Z23 Encounter for immunization: Secondary | ICD-10-CM | POA: Diagnosis not present

## 2017-03-11 DIAGNOSIS — R0781 Pleurodynia: Secondary | ICD-10-CM

## 2017-03-11 DIAGNOSIS — K529 Noninfective gastroenteritis and colitis, unspecified: Secondary | ICD-10-CM

## 2017-03-11 DIAGNOSIS — I10 Essential (primary) hypertension: Secondary | ICD-10-CM

## 2017-03-11 DIAGNOSIS — E559 Vitamin D deficiency, unspecified: Secondary | ICD-10-CM

## 2017-03-11 DIAGNOSIS — M25571 Pain in right ankle and joints of right foot: Secondary | ICD-10-CM

## 2017-03-11 DIAGNOSIS — G629 Polyneuropathy, unspecified: Secondary | ICD-10-CM

## 2017-03-11 MED ORDER — ROSUVASTATIN CALCIUM 10 MG PO TABS: 10.0000 mg | ORAL_TABLET | Freq: Every day | ORAL | 0 refills | Status: DC

## 2017-03-11 MED ORDER — FLUTICASONE PROPIONATE 50 MCG/ACT NA SUSP: 2.0000 | Freq: Every day | NASAL | 6 refills | Status: DC

## 2017-03-11 NOTE — Addendum Note (Signed)
Addended by: Zannie Cove on: 03/11/2017 11:51 AM   Modules accepted: Orders

## 2017-03-11 NOTE — Progress Notes (Signed)
Subjective:    Patient ID: Rachel Walton, female    DOB: November 13, 1929, 83 y.o.   MRN: [id number]  HPI Pt here for follow up and management of chronic medical problems which includes hypertension and hyperlipidemia. She is taking medication regularly.  The patient today complains of left rib pain and also burning in her feet.  She will be given FOBT to return and will get lab work done today.  She will get her flu shot if the congestion is clear in her chest.  The patient was raking leaves a lot yesterday and has had some congestion since that time.  The patient is active.  She has been raking leaves recently.  She complains of the left rib pain that seems to go back toward the spine and this occurs at times.  She is also had ongoing drainage and has not been using her Flonase regularly.  She also complains of her feet burning off and on at nighttime and that has been burning worse recently.  She denies any chest pain or shortness of breath.  She has seen the gastroenterologist and has a history of colitis and the medication that she is taking for this has helped this considerably though at times she still has some loose bowel movements more than usual.  She has not seen any blood in her stool or had any black tarry bowel movements.  She is passing her water without problems.  She complains of her feet burning for years as mentioned and this is bothering her more recently.    Patient Active Problem List   Diagnosis Date Noted  . Osteopenia with high risk of fracture 10/21/2016  . Basal cell carcinoma of nose 02/05/2016  . Lymphocytic colitis 12/20/2015  . Barrett's esophagus 12/20/2015  . 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 Depression 11/07/2015  . Pain in the chest   . Chest pain 10/09/2015  . Hypokalemia 10/09/2015  . Anemia 10/09/2015  . Esophageal stricture 12/11/2013  . Allergic rhinitis due to pollen 12/11/2013  . Essential hypertension 12/11/2013  . Generalized anxiety disorder  08/21/2013  . Gall stones 06/15/2013  . Hyperlipemia 11/21/2012  . Tremor   . Postmenopausal   . Colon polyp   . Osteopenia of the elderly   . Kyphosis   . Recurrent UTI   . Atrophic vaginitis   . Diverticulosis    Outpatient Encounter Prescriptions as of 03/11/2017  Medication Sig  . Calcium Carbonate-Vitamin D (CALCIUM 600+D) 600-200 MG-UNIT TABS Take 1 tablet by mouth daily. Take with a meal (Patient taking differently: Take 2 tablets by mouth daily. Take with a meal)  . clonazePAM (KLONOPIN) 0.5 MG tablet Take 1 tablet (0.5 mg total) by mouth 2 (two) times daily as needed (stress).  . mesalamine (LIALDA) 1.2 g EC tablet Take 2 tablets (2.4 g total) by mouth daily with breakfast. (Patient taking differently: Take 1.2 g by mouth daily with breakfast. )  . Multiple Vitamins-Minerals (ICAPS AREDS FORMULA PO) Take 1 tablet by mouth 2 (two) times daily before a meal.   . potassium chloride (K-DUR) 10 MEQ tablet Take 1 Tablet by mouth once daily  . propranolol (INDERAL) 10 MG tablet Take 10 mg by mouth daily.  . rosuvastatin (CRESTOR) 5 MG tablet Take 1 tablet (5 mg total) by mouth daily.  . fluticasone (FLONASE) 50 MCG/ACT nasal spray Place 1 spray into both nostrils daily as needed for rhinitis.   . [DISCONTINUED] diphenhydramine-acetaminophen (TYLENOL PM) 25-500 MG TABS tablet Take 1 tablet  by mouth at bedtime as needed.  . [DISCONTINUED] fish oil-omega-3 fatty acids 1000 MG capsule Take 2 g by mouth daily.  . [DISCONTINUED] Vitamin D, Cholecalciferol, 1000 units TABS Take 2,000 Units by mouth 2 (two) times a week.   No facility-administered encounter medications on file as of 03/11/2017.       Review of Systems  Constitutional: Negative.   HENT: Negative.   Eyes: Negative.   Respiratory: Negative.   Cardiovascular: Negative.   Gastrointestinal: Negative.   Endocrine: Negative.   Genitourinary: Negative.   Musculoskeletal: Positive for arthralgias (left rib pain ).  Skin:  Negative.   Allergic/Immunologic: Negative.   Neurological: Negative.        Bilateral feet burning at night   Hematological: Negative.   Psychiatric/Behavioral: Negative.        Objective:   Physical Exam  Constitutional: She is oriented to person, place, and time. She appears well-developed and well-nourished. No distress.  The patient is pleasant and alert  HENT:  Head: Normocephalic and atraumatic.  Right Ear: External ear normal.  Left Ear: External ear normal.  Mouth/Throat: Oropharynx is clear and moist. No oropharyngeal exudate.  Nasal congestion and turbinate swelling bilaterally  Eyes: Pupils are equal, round, and reactive to light. Conjunctivae and EOM are normal. Right eye exhibits no discharge. Left eye exhibits no discharge. No scleral icterus.  Neck: Normal range of motion. Neck supple. No thyromegaly present.  No bruits thyromegaly or anterior cervical adenopathy  Cardiovascular: Normal rate, regular rhythm, normal heart sounds and intact distal pulses.   No murmur heard. Heart is regular at 72/min with good pedal pulses  Pulmonary/Chest: Effort normal and breath sounds normal. No respiratory distress. She has no wheezes. She has no rales.  Clear anteriorly and posteriorly  Abdominal: Soft. Bowel sounds are normal. She exhibits no mass. There is tenderness. There is no rebound and no guarding.  Slight lower abdominal tenderness left greater than right lower quadrants and some suprapubic tenderness.  Musculoskeletal: Normal range of motion. She exhibits tenderness. She exhibits no edema.  Tenderness right shoulder area and general  Lymphadenopathy:    She has no cervical adenopathy.  Neurological: She is alert and oriented to person, place, and time. She has normal reflexes. No cranial nerve deficit.  Skin: Skin is warm and dry. No rash noted.  Psychiatric: She has a normal mood and affect. Her behavior is normal. Judgment and thought content normal.  Nursing note  and vitals reviewed.  BP (!) 152/72 (BP Location: Left Arm)   Pulse 66   Temp 98.6 F (37 C) (Oral)   Ht '5\' 2"'$  (1.575 m)   Wt 125 lb (56.7 kg)   BMI 22.86 kg/m         Assessment & Plan:   1. Essential hypertension -Systolic blood pressure is slightly elevated today but we will not make any changes in treatment she will continue to watch her sodium intake and take her medicines as she is currently doing and check blood pressures at home and bring these in for review at the next visit - BMP8+EGFR - CBC with Differential/Platelet - Hepatic function panel  2. Vitamin D deficiency -Continue current treatment pending results of lab work - CBC with Differential/Platelet - VITAMIN D 25 Hydroxy (Vit-D Deficiency, Fractures)  3. Pure hypercholesterolemia -Continue with Crestor and aggressive therapeutic lifestyle changes - CBC with Differential/Platelet - Lipid panel  4. Seasonal allergic rhinitis due to pollen -Use Flonase 1 spray each nostril at bedtime and  use this regularly and use nasal saline during the protective barriers when out raking leaves.  5. Colitis -Continue follow-up with gastroenterologist  6. Neuropathy -Check B12 level and consider trying gabapentin at bedtime if problems continue  7. Right ankle pain, unspecified chronicity -Appointment with orthopedic specialist  Meds ordered this encounter  Medications  . propranolol (INDERAL) 10 MG tablet    Sig: Take 10 mg by mouth daily.   Patient Instructions                       Medicare Annual Wellness Visit  [facility] and the medical providers at [facility] strive to bring you the best medical care.  In doing so we not only want to address your current medical conditions and concerns but also to detect new conditions early and prevent illness, disease and health-related problems.    Medicare offers a yearly Wellness Visit which allows our clinical staff to assess your need for  preventative services including immunizations, lifestyle education, counseling to decrease risk of preventable diseases and screening for fall risk and other medical concerns.    This visit is provided free of charge (no copay) for all Medicare recipients. The clinical pharmacists at [facility] have begun to conduct these Wellness Visits which will also include a thorough review of all your medications.    As you primary medical provider recommend that you make an appointment for your Annual Wellness Visit if you have not done so already this year.  You may set up this appointment before you leave today or you may call back ([phone]) and schedule an appointment.  Please make sure when you call that you mention that you are scheduling your Annual Wellness Visit with the clinical pharmacist so that the appointment may be made for the proper length of time.     Continue current medications. Continue good therapeutic lifestyle changes which include good diet and exercise. Fall precautions discussed with patient. If an FOBT was given today- please return it to our front desk. If you are over 54 years old - you may need Prevnar 68 or the adult Pneumonia vaccine.  **Flu shots are available--- please call and schedule a FLU-CLINIC appointment**  After your visit with [place] today you will receive a survey in the mail or online from [employer] regarding your care with [place]. Please take a moment to fill this out. Your feedback is very important to [place] as you can help [place] better understand your patient needs as well as improve your experience and satisfaction. WE CARE ABOUT YOU!!!     Arrie Senate MD

## 2017-03-11 NOTE — Patient Instructions (Signed)
Medicare Annual Wellness Visit  [facility] and the medical providers at [facility] strive to bring you the best medical care.  In doing so we not only want to address your current medical conditions and concerns but also to detect new conditions early and prevent illness, disease and health-related problems.    Medicare offers a yearly Wellness Visit which allows our clinical staff to assess your need for preventative services including immunizations, lifestyle education, counseling to decrease risk of preventable diseases and screening for fall risk and other medical concerns.    This visit is provided free of charge (no copay) for all Medicare recipients. The clinical pharmacists at [facility] have begun to conduct these Wellness Visits which will also include a thorough review of all your medications.    As you primary medical provider recommend that you make an appointment for your Annual Wellness Visit if you have not done so already this year.  You may set up this appointment before you leave today or you may call back ([phone]) and schedule an appointment.  Please make sure when you call that you mention that you are scheduling your Annual Wellness Visit with the clinical pharmacist so that the appointment may be made for the proper length of time.     Continue current medications. Continue good therapeutic lifestyle changes which include good diet and exercise. Fall precautions discussed with patient. If an FOBT was given today- please return it to our front desk. If you are over 50 years old - you may need Prevnar 13 or the adult Pneumonia vaccine.  **Flu shots are available--- please call and schedule a FLU-CLINIC appointment**  After your visit with us today you will receive a survey in the mail or online from [employer] regarding your care with us. Please take a moment to fill this out. Your feedback is very  important to us as you can help us better understand your patient needs as well as improve your experience and satisfaction. WE CARE ABOUT YOU!!!    

## 2017-03-12 LAB — CBC WITH DIFFERENTIAL/PLATELET
Basophils Absolute: 0 10*3/uL (ref 0.0–0.2)
Basos: 1 %
EOS (ABSOLUTE): 0.1 10*3/uL (ref 0.0–0.4)
Eos: 2 %
Hematocrit: 40.3 % (ref 34.0–46.6)
Hemoglobin: 13.1 g/dL (ref 11.1–15.9)
Immature Grans (Abs): 0 10*3/uL (ref 0.0–0.1)
Immature Granulocytes: 0 %
Lymphocytes Absolute: 2.5 10*3/uL (ref 0.7–3.1)
Lymphs: 46 %
MCH: 29 pg (ref 26.6–33.0)
MCHC: 32.5 g/dL (ref 31.5–35.7)
MCV: 89 fL (ref 79–97)
Monocytes Absolute: 0.4 10*3/uL (ref 0.1–0.9)
Monocytes: 8 %
Neutrophils Absolute: 2.3 10*3/uL (ref 1.4–7.0)
Neutrophils: 43 %
Platelets: 275 10*3/uL (ref 150–379)
RBC: 4.52 x10E6/uL (ref 3.77–5.28)
RDW: 13.7 % (ref 12.3–15.4)
WBC: 5.3 10*3/uL (ref 3.4–10.8)

## 2017-03-12 LAB — LIPID PANEL
Chol/HDL Ratio: 2.4 ratio (ref 0.0–4.4)
Cholesterol, Total: 232 mg/dL — ABNORMAL HIGH (ref 100–199)
HDL: 95 mg/dL (ref >39)
LDL Calculated: 118 mg/dL — ABNORMAL HIGH (ref 0–99)
Triglycerides: 94 mg/dL (ref 0–149)
VLDL Cholesterol Cal: 19 mg/dL (ref 5–40)

## 2017-03-12 LAB — BMP8+EGFR
BUN/Creatinine Ratio: 9 — ABNORMAL LOW (ref 12–28)
BUN: 8 mg/dL (ref 8–27)
CO2: 28 mmol/L (ref 20–29)
Calcium: 9.1 mg/dL (ref 8.7–10.3)
Chloride: 104 mmol/L (ref 96–106)
Creatinine, Ser: 0.87 mg/dL (ref 0.57–1.00)
GFR calc Af Amer: 70 mL/min/{1.73_m2} (ref >59)
GFR calc non Af Amer: 61 mL/min/{1.73_m2} (ref >59)
Glucose: 95 mg/dL (ref 65–99)
Potassium: 4.5 mmol/L (ref 3.5–5.2)
Sodium: 144 mmol/L (ref 134–144)

## 2017-03-12 LAB — HEPATIC FUNCTION PANEL
ALBUMIN: 4 g/dL (ref 3.5–4.7)
ALK PHOS: 54 IU/L (ref 39–117)
ALT: 15 IU/L (ref 0–32)
AST: 20 IU/L (ref 0–40)
BILIRUBIN TOTAL: 0.3 mg/dL (ref 0.0–1.2)
BILIRUBIN, DIRECT: 0.08 mg/dL (ref 0.00–0.40)
TOTAL PROTEIN: 6.1 g/dL (ref 6.0–8.5)

## 2017-03-12 LAB — VITAMIN B12: Vitamin B-12: 451 pg/mL (ref 232–1245)

## 2017-03-12 LAB — VITAMIN D 25 HYDROXY (VIT D DEFICIENCY, FRACTURES): Vit D, 25-Hydroxy: 43.8 ng/mL (ref 30.0–100.0)

## 2017-04-08 ENCOUNTER — Other Ambulatory Visit (INDEPENDENT_AMBULATORY_CARE_PROVIDER_SITE_OTHER): Payer: Medicare Other

## 2017-04-08 ENCOUNTER — Other Ambulatory Visit: Payer: Self-pay | Admitting: Orthopedic Surgery

## 2017-04-08 DIAGNOSIS — R52 Pain, unspecified: Secondary | ICD-10-CM

## 2017-04-08 DIAGNOSIS — M79671 Pain in right foot: Secondary | ICD-10-CM | POA: Diagnosis not present

## 2017-04-20 ENCOUNTER — Ambulatory Visit: Payer: Medicare Other | Admitting: Family Medicine

## 2017-04-21 ENCOUNTER — Other Ambulatory Visit: Payer: Self-pay | Admitting: Family Medicine

## 2017-04-21 ENCOUNTER — Other Ambulatory Visit: Payer: Self-pay | Admitting: Internal Medicine

## 2017-04-22 ENCOUNTER — Other Ambulatory Visit: Payer: Self-pay | Admitting: Internal Medicine

## 2017-04-22 NOTE — Telephone Encounter (Signed)
Last seen 11/18  DWM  If approved route to nurse to call into [facility]

## 2017-04-23 ENCOUNTER — Other Ambulatory Visit: Payer: Self-pay | Admitting: Family Medicine

## 2017-04-28 ENCOUNTER — Other Ambulatory Visit: Payer: Self-pay | Admitting: Orthopedic Surgery

## 2017-05-14 ENCOUNTER — Encounter: Payer: Self-pay | Admitting: Internal Medicine

## 2017-07-01 ENCOUNTER — Encounter (HOSPITAL_COMMUNITY): Payer: Self-pay | Admitting: Emergency Medicine

## 2017-07-01 ENCOUNTER — Emergency Department (HOSPITAL_COMMUNITY)
Admission: EM | Admit: 2017-07-01 | Discharge: 2017-07-01 | Disposition: A | Discharge status: Home / Self Care | Payer: Medicare Other | Attending: Emergency Medicine | Admitting: Emergency Medicine

## 2017-07-01 ENCOUNTER — Telehealth: Payer: Self-pay | Admitting: Family Medicine

## 2017-07-01 ENCOUNTER — Emergency Department (HOSPITAL_COMMUNITY): Payer: Medicare Other

## 2017-07-01 DIAGNOSIS — Z79899 Other long term (current) drug therapy: Secondary | ICD-10-CM | POA: Insufficient documentation

## 2017-07-01 DIAGNOSIS — R109 Unspecified abdominal pain: Secondary | ICD-10-CM | POA: Insufficient documentation

## 2017-07-01 DIAGNOSIS — Z85828 Personal history of other malignant neoplasm of skin: Secondary | ICD-10-CM | POA: Diagnosis not present

## 2017-07-01 DIAGNOSIS — K625 Hemorrhage of anus and rectum: Secondary | ICD-10-CM | POA: Diagnosis not present

## 2017-07-01 LAB — TYPE AND SCREEN
ABO/RH(D): A POS
ANTIBODY SCREEN: NEGATIVE

## 2017-07-01 LAB — CBC WITH DIFFERENTIAL/PLATELET
BASOS ABS: 0 10*3/uL (ref 0.0–0.1)
Basophils Relative: 1 %
EOS ABS: 0.1 10*3/uL (ref 0.0–0.7)
Eosinophils Relative: 3 %
HCT: 39.6 % (ref 36.0–46.0)
HEMOGLOBIN: 12.7 g/dL (ref 12.0–15.0)
LYMPHS ABS: 2.4 10*3/uL (ref 0.7–4.0)
Lymphocytes Relative: 47 %
MCH: 29.1 pg (ref 26.0–34.0)
MCHC: 32.1 g/dL (ref 30.0–36.0)
MCV: 90.8 fL (ref 78.0–100.0)
Monocytes Absolute: 0.5 10*3/uL (ref 0.1–1.0)
Monocytes Relative: 9 %
NEUTROS PCT: 40 %
Neutro Abs: 2.1 10*3/uL (ref 1.7–7.7)
Platelets: 233 10*3/uL (ref 150–400)
RBC: 4.36 MIL/uL (ref 3.87–5.11)
RDW: 12.9 % (ref 11.5–15.5)
WBC: 5.1 10*3/uL (ref 4.0–10.5)

## 2017-07-01 LAB — POC OCCULT BLOOD, ED: FECAL OCCULT BLD: NEGATIVE

## 2017-07-01 LAB — BASIC METABOLIC PANEL
ANION GAP: 11 (ref 5–15)
BUN: 9 mg/dL (ref 6–20)
CHLORIDE: 102 mmol/L (ref 101–111)
CO2: 25 mmol/L (ref 22–32)
Calcium: 8.3 mg/dL — ABNORMAL LOW (ref 8.9–10.3)
Creatinine, Ser: 0.74 mg/dL (ref 0.44–1.00)
Glucose, Bld: 103 mg/dL — ABNORMAL HIGH (ref 65–99)
POTASSIUM: 3.7 mmol/L (ref 3.5–5.1)
SODIUM: 138 mmol/L (ref 135–145)

## 2017-07-01 MED ORDER — IOPAMIDOL (ISOVUE-300) INJECTION 61%
100.0000 mL | Freq: Once | INTRAVENOUS | Status: AC | PRN
  Administered 2017-07-01: 100 mL via INTRAVENOUS

## 2017-07-01 NOTE — ED Provider Notes (Signed)
[facility] EMERGENCY DEPARTMENT Provider Note   CSN: [id number] Arrival date & time: 07/01/17  1735     History   Chief Complaint Chief Complaint  Patient presents with  . Rectal Bleeding    HPI Rachel Walton is a 84 y.o. female.  Patient states that she has had some bright red bleeding today.  No abdominal discomfort.  Patient has a history of hemorrhoids   The history is provided by the patient. No language interpreter was used.  Rectal Bleeding  Quality:  Bright red Amount:  Moderate Timing:  Constant Chronicity:  New Context: not anal fissures   Similar prior episodes: yes   Relieved by:  Nothing Associated symptoms: no abdominal pain     Past Medical History:  Diagnosis Date  . Acute gastric ulcer   . Allergy   . Anxiety   . Atrophic vaginitis   . Barrett's esophagus   . Basal cell carcinoma of nose     under nose  . Cataract   . Depression   . Diverticulosis   . Diverticulosis   . Esophageal stricture   . Esophageal ulcer   . Fatty liver   . GERD (gastroesophageal reflux disease)   . Heart murmur   . Hiatal hernia   . Hx of adenomatous colonic polyps   . Hyperlipidemia   . Hypertension   . Hypokalemia   . IBS (irritable bowel syndrome)   . Kyphosis   . Lymphocytic colitis   . Macular degeneration    "?wet or dry; ? both eyes"  . 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 Osteopenia of the elderly   . Pneumonia X 1  . Postmenopausal   . Recurrent UTI   . 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 Tremor   . Vitamin D deficiency     Patient Active Problem List   Diagnosis Date Noted  . Osteopenia with high risk of fracture 10/21/2016  . Basal cell carcinoma of nose 02/05/2016  . Lymphocytic colitis 12/20/2015  . Barrett's esophagus 12/20/2015  . 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 Depression 11/07/2015  . Pain in the chest   . Chest pain 10/09/2015  . Hypokalemia 10/09/2015  . Anemia 10/09/2015  . Esophageal stricture  12/11/2013  . Allergic rhinitis due to pollen 12/11/2013  . Essential hypertension 12/11/2013  . Generalized anxiety disorder 08/21/2013  . Gall stones 06/15/2013  . Hyperlipemia 11/21/2012  . Tremor   . Postmenopausal   . Colon polyp   . Osteopenia of the elderly   . Kyphosis   . Recurrent UTI   . Atrophic vaginitis   . Diverticulosis     Past Surgical History:  Procedure Laterality Date  . ABDOMINAL HYSTERECTOMY  1980  . APPENDECTOMY    . BACK SURGERY    . BREAST BIOPSY Left   . CATARACT EXTRACTION W/ INTRAOCULAR LENS  IMPLANT, BILATERAL Bilateral   . COLONOSCOPY    . ESOPHAGOGASTRODUODENOSCOPY (EGD) WITH ESOPHAGEAL DILATION  X 1  . EYE SURGERY    . LUMBAR DISC SURGERY     "fragmented disc"  . POLYPECTOMY    . SQUAMOUS CELL CARCINOMA EXCISION     nose tip    OB History    No data available       Home Medications    Prior to Admission medications   Medication Sig Start Date End Date Taking? Authorizing Provider  Calcium Carbonate-Vitamin D (CALCIUM 600+D) 600-200 MG-UNIT TABS Take 1 tablet by  mouth daily. Take with a meal Patient taking differently: Take 2 tablets by mouth daily. Take with a meal 12/30/15   Cherre Robins, PharmD  clonazePAM (KLONOPIN) 0.5 MG tablet TAKE ONE TABLET TWICE DAILY AS NEEDED 04/22/17   Chipper Herb, MD  fluticasone [facility]) 50 MCG/ACT nasal spray Place 1 spray into both nostrils daily as needed for rhinitis.  04/10/14   [provider]  fluticasone (FLONASE) 50 MCG/ACT nasal spray Place 2 sprays into both nostrils daily. 03/11/17   Chipper Herb, MD  mesalamine (LIALDA) 1.2 g EC tablet TAKE 2 TABLETS EVERY MORNING WITH BREAKFAST 04/26/17   Chipper Herb, MD  Multiple Vitamins-Minerals (ICAPS AREDS FORMULA PO) Take 1 tablet by mouth 2 (two) times daily before a meal.     [provider]  potassium chloride (K-DUR) 10 MEQ tablet Take 1 Tablet by mouth once daily 12/24/16   Chipper Herb, MD  propranolol (INDERAL) 10  MG tablet Take 10 mg by mouth daily.    [provider]  rosuvastatin (CRESTOR) 10 MG tablet Take 1 tablet (10 mg total) by mouth daily. 03/11/17   Chipper Herb, MD    Family History Family History  Problem Relation Age of Onset  . Stroke Mother   . Heart disease Father        MI  . Heart attack Father   . Cancer Sister        Brain tumor  . Hypertension Sister   . Osteoporosis Sister   . Colon cancer Brother   . Heart disease Brother   . Diabetes Son 8  . Diabetes Maternal Uncle   . Diabetes Son 98  . Hypertension Sister   . Breast cancer Sister 83  . Hypertension Sister   . Cancer Sister        breast  . Osteoporosis Sister   . Hypertension Sister   . Hypertension Sister   . Stroke Sister   . Hypertension Sister   . Hypertension Sister   . Colon cancer Unknown        nephew  . Brain cancer Son        deceased  . Cancer Son        brain  . Thyroid cancer Other        niece  . Breast cancer Other   . Breast cancer Other   . Breast cancer Other   . Colon polyps Neg Hx   . Esophageal cancer Neg Hx   . Rectal cancer Neg Hx   . Stomach cancer Neg Hx     Social History Social History   Tobacco Use  . Smoking status: Never Smoker  . Smokeless tobacco: Never Used  Substance Use Topics  . Alcohol use: No    Alcohol/week: 0.0 oz  . Drug use: No     Allergies   Alendronate sodium; Amoxicillin; Budesonide; Cephalexin; Prednisone; Sudafed [pseudoephedrine hcl]; Sulfa antibiotics; Toprol xl [metoprolol succinate]; and Trimethoprim   Review of Systems Review of Systems  Constitutional: Negative for appetite change and fatigue.  HENT: Negative for congestion, ear discharge and sinus pressure.   Eyes: Negative for discharge.  Respiratory: Negative for cough.   Cardiovascular: Negative for chest pain.  Gastrointestinal: Positive for hematochezia. Negative for abdominal pain and diarrhea.       Rectal bleeding  Genitourinary: Negative for frequency  and hematuria.  Musculoskeletal: Negative for back pain.  Skin: Negative for rash.  Neurological: Negative for seizures and headaches.  Psychiatric/Behavioral: Negative for hallucinations.     Physical Exam Updated Vital Signs BP (!) 160/72   Pulse 69   Temp 98.9 F (37.2 C) (Oral)   Resp 18   Ht _0  (1.549 m)   Wt 57.6 kg (127 lb)   SpO2 100%   BMI 24.00 kg/m   Physical Exam  Constitutional: She is oriented to person, place, and time. She appears well-developed.  HENT:  Head: Normocephalic.  Eyes: Conjunctivae and EOM are normal. No scleral icterus.  Neck: Neck supple. No thyromegaly present.  Cardiovascular: Normal rate and regular rhythm. Exam reveals no gallop and no friction rub.  No murmur heard. Pulmonary/Chest: No stridor. She has no wheezes. She has no rales. She exhibits no tenderness.  Abdominal: She exhibits no distension. There is no tenderness. There is no rebound.  Genitourinary:  Genitourinary Comments: Rectal exam shows brown stool is heme positive  Musculoskeletal: Normal range of motion. She exhibits no edema.  Lymphadenopathy:    She has no cervical adenopathy.  Neurological: She is oriented to person, place, and time. She exhibits normal muscle tone. Coordination normal.  Skin: No rash noted. No erythema.  Psychiatric: She has a normal mood and affect. Her behavior is normal.     ED Treatments / Results  Labs (all labs ordered are listed, but only abnormal results are displayed) Labs Reviewed  BASIC METABOLIC PANEL - Abnormal; Notable for the following components:      Result Value   Glucose, Bld 103 (*)    Calcium 8.3 (*)    All other components within normal limits  CBC WITH DIFFERENTIAL/PLATELET  TYPE AND SCREEN    EKG  EKG Interpretation None       Radiology No results found.  Procedures Procedures (including critical care time)  Medications Ordered in ED Medications  iopamidol (ISOVUE-300) 61 % injection 100 mL (100 mLs  Intravenous Contrast Given 07/01/17 2147)     Initial Impression / Assessment and Plan / ED Course  I have reviewed the triage vital signs and the nursing notes.  Pertinent labs & imaging results that were available during my care of the patient were reviewed by me and considered in my medical decision making (see chart for details).   CBC and C met unremarkable.  CT scan of abdomen shows probable internal hemorrhoids.  Patient has been stable and she will be sent home for follow-up with her PCP suspect bleeding is from hemorrhoids    Final Clinical Impressions(s) / ED Diagnoses   Final diagnoses:  None    ED Discharge Orders    None       Milton Ferguson, MD 07/03/17 1332

## 2017-07-01 NOTE — ED Triage Notes (Signed)
Patient complaining of bright red rectal bleeding starting this morning. States she has a history of hemorrhoids.

## 2017-07-01 NOTE — Telephone Encounter (Signed)
BUSY 2/21-jhb

## 2017-07-01 NOTE — Discharge Instructions (Signed)
Follow-up with your gastroenterologist or you can see Dr. Oneida Alar gastroenterologist in [place].  He should be seen next week.  If he started having heavy bleeding he should return to the emergency department

## 2017-07-02 ENCOUNTER — Telehealth: Payer: Self-pay | Admitting: Internal Medicine

## 2017-07-02 ENCOUNTER — Telehealth: Payer: Self-pay | Admitting: Family Medicine

## 2017-07-02 NOTE — Telephone Encounter (Signed)
DUP - not e- pt went to ER

## 2017-07-02 NOTE — Telephone Encounter (Signed)
Pt aware to follow up with PA in Monday.

## 2017-07-02 NOTE — Telephone Encounter (Signed)
Pt states she was seen in the ER last night for rectal bleeding and was told to follow up with GI sooner than 07/14/17. Pt scheduled to see Ellouise Newer PA 07/05/17@3 :15pm. Pt aware.

## 2017-07-05 ENCOUNTER — Ambulatory Visit: Payer: Medicare Other | Admitting: Physician Assistant

## 2017-07-05 ENCOUNTER — Encounter: Payer: Self-pay | Admitting: Physician Assistant

## 2017-07-05 VITALS — BP 140/60 | HR 65 | Ht 61.0 in | Wt 129.0 lb

## 2017-07-05 DIAGNOSIS — K625 Hemorrhage of anus and rectum: Secondary | ICD-10-CM

## 2017-07-05 MED ORDER — HYDROCORTISONE 2.5 % RE CREA: 1.0000 "application " | TOPICAL_CREAM | Freq: Two times a day (BID) | RECTAL | 1 refills | Status: DC

## 2017-07-05 NOTE — Patient Instructions (Signed)
Your provider suggests that you use Hydrocortisone ointment to treat your symptoms. This can be applied to a glycerin suppository which can be purchased over the counter. Apply the suppository into the rectum twice daily for the next 7- 14 days.    Please try a daily powder fiber such as metamucil or Benefiber 25-35 grams per day.

## 2017-07-05 NOTE — Progress Notes (Addendum)
Chief Complaint: Rectal bleeding  HPI:    Rachel Walton is an 84 year old Caucasian female with a past medical history of lymphocytic colitis, GERD and others listed below, who follows with Dr. Hilarie Fredrickson presents to clinic today for follow-up after being seen in the ER for rectal bleeding.    Colo 08/15/15 with diverticulosis in the sigmoid colon, internal hemorrhoids and otherwise normal.      Seen in ED 07/01/17 complaint of rectal bleeding, rectal exam showed brown stool which was heme positive.  Normal CBC and CMP.  CT abdomen pelvis with contrast showed no acute intra-abdominal or pelvic process, rectal wall thickening seen with internal hemorrhoids and moderate amount of retained large bowel stool without bowel obstruction.    Today, explains that on 07/01/17 she had 2 episodes of bright red blood with a bowel movement and this was "quite a lot".  Has seen none since, has continued with her normal bowel movements which she describes as smaller and about 20-30 minutes after eating as well as 2-3 times at night when she urinates.  These are often accompanied by a lot of gas.  No abdominal or rectal pain.    Denies fever, chills, nausea, vomiting or symptoms that awaken her at night.  Past Medical History:  Diagnosis Date  . Acute gastric ulcer   . Allergy   . Anxiety   . Atrophic vaginitis   . Barrett's esophagus   . Basal cell carcinoma of nose     under nose  . Cataract   . Depression   . Diverticulosis   . Diverticulosis   . Esophageal stricture   . Esophageal ulcer   . Fatty liver   . GERD (gastroesophageal reflux disease)   . Heart murmur   . Hiatal hernia   . Hx of adenomatous colonic polyps   . Hyperlipidemia   . Hypertension   . Hypokalemia   . IBS (irritable bowel syndrome)   . Kyphosis   . Lymphocytic colitis   . Macular degeneration    "?wet or dry; ? both eyes"  . 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 Osteopenia of the elderly   . Pneumonia X 1  .  Postmenopausal   . Recurrent UTI   . 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 Tremor   . Vitamin D deficiency     Past Surgical History:  Procedure Laterality Date  . ABDOMINAL HYSTERECTOMY  1980  . APPENDECTOMY    . BACK SURGERY    . BREAST BIOPSY Left   . CATARACT EXTRACTION W/ INTRAOCULAR LENS  IMPLANT, BILATERAL Bilateral   . COLONOSCOPY    . ESOPHAGOGASTRODUODENOSCOPY (EGD) WITH ESOPHAGEAL DILATION  X 1  . EYE SURGERY    . LUMBAR DISC SURGERY     "fragmented disc"  . POLYPECTOMY    . SQUAMOUS CELL CARCINOMA EXCISION     nose tip    Current Outpatient Medications  Medication Sig Dispense Refill  . Ca Phosphate-Cholecalciferol (CALTRATE GUMMY BITES PO) Take 2 drops by mouth daily.    . shark liver oil-cocoa butter (PREPARATION H) 0.25-88.44 % suppository Place 1 suppository rectally as needed for hemorrhoids.    . Calcium Carbonate-Vitamin D (CALCIUM 600+D) 600-200 MG-UNIT TABS Take 1 tablet by mouth daily. Take with a meal (Patient taking differently: Take 2 tablets by mouth daily. Take with a meal)  0  . clonazePAM (KLONOPIN) 0.5 MG tablet TAKE ONE TABLET TWICE DAILY AS NEEDED 60 tablet 2  .  fluticasone (FLONASE) 50 MCG/ACT nasal spray Place 1 spray into both nostrils daily as needed for rhinitis.     . hydrocortisone (ANUSOL-HC) 2.5 % rectal cream Place 1 application rectally 2 (two) times daily. 30 g 1  . mesalamine (LIALDA) 1.2 g EC tablet TAKE 2 TABLETS EVERY MORNING WITH BREAKFAST 180 tablet 1  . potassium chloride (K-DUR) 10 MEQ tablet Take 1 Tablet by mouth once daily 90 tablet 1  . rosuvastatin (CRESTOR) 10 MG tablet Take 1 tablet (10 mg total) by mouth daily. 90 tablet 0   No current facility-administered medications for this visit.     Allergies as of 07/05/2017 - Review Complete 07/05/2017  Allergen Reaction Noted  . Alendronate sodium Other (See Comments) 08/24/2010  . Amoxicillin  06/07/2014  . Budesonide Other (See Comments) 01/23/2016  . Cephalexin   08/24/2010  . Prednisone Other (See Comments) 08/24/2010  . Sudafed [pseudoephedrine hcl] Other (See Comments) 08/24/2010  . Sulfa antibiotics Hives 08/24/2010  . Toprol xl [metoprolol succinate] Other (See Comments) 08/24/2010  . Trimethoprim  11/17/2012    Family History  Problem Relation Age of Onset  . Stroke Mother   . Heart disease Father        MI  . Heart attack Father   . Cancer Sister        Brain tumor  . Hypertension Sister   . Osteoporosis Sister   . Colon cancer Brother   . Heart disease Brother   . Diabetes Son 8  . Diabetes Maternal Uncle   . Diabetes Son 1  . Hypertension Sister   . Breast cancer Sister 82  . Hypertension Sister   . Cancer Sister        breast  . Osteoporosis Sister   . Hypertension Sister   . Hypertension Sister   . Stroke Sister   . Hypertension Sister   . Hypertension Sister   . Colon cancer Unknown        nephew  . Brain cancer Son        deceased  . Cancer Son        brain  . Thyroid cancer Other        niece  . Breast cancer Other   . Breast cancer Other   . Breast cancer Other   . Colon polyps Neg Hx   . Esophageal cancer Neg Hx   . Rectal cancer Neg Hx   . Stomach cancer Neg Hx     Social History   Socioeconomic History  . Marital status: Married    Spouse name: Not on file  . Number of children: 3  . Years of education: Not on file  . Highest education level: Not on file  Social Needs  . Financial resource strain: Not on file  . Food insecurity - worry: Not on file  . Food insecurity - inability: Not on file  . Transportation needs - medical: Not on file  . Transportation needs - non-medical: Not on file  Occupational History  . Occupation: Retired  Tobacco Use  . Smoking status: Never Smoker  . Smokeless tobacco: Never Used  Substance and Sexual Activity  . Alcohol use: No    Alcohol/week: 0.0 oz  . Drug use: No  . Sexual activity: Yes  Other Topics Concern  . Not on file  Social History  Narrative  . Not on file    Review of Systems:    Constitutional: No weight loss, fever or chills Cardiovascular: No chest  pain Respiratory: No SOB  Gastrointestinal: See HPI and otherwise negative   Physical Exam:  Vital signs: BP 140/60   Pulse 65   Ht 5\' 1"  (1.549 m)   Wt 129 lb (58.5 kg)   BMI 24.37 kg/m   Constitutional:   Pleasant Elderly Caucasian female appears to be in NAD, Well developed, Well nourished, alert and cooperative Respiratory: Respirations even and unlabored. Lungs clear to auscultation bilaterally.   No wheezes, crackles, or rhonchi.  Cardiovascular: Normal S1, S2. No MRG. Regular rate and rhythm. No peripheral edema, cyanosis or pallor.  Gastrointestinal:  Soft, nondistended, nontender. No rebound or guarding. Normal bowel sounds. No appreciable masses or hepatomegaly. Rectal:  Not performed.  Psychiatric: Demonstrates good judgement and reason without abnormal affect or behaviors.  RELEVANT LABS AND IMAGING: CBC    Component Value Date/Time   WBC 5.1 07/01/2017 1810   RBC 4.36 07/01/2017 1810   HGB 12.7 07/01/2017 1810   HGB 13.1 03/11/2017 1037   HCT 39.6 07/01/2017 1810   HCT 40.3 03/11/2017 1037   PLT 233 07/01/2017 1810   PLT 275 03/11/2017 1037   MCV 90.8 07/01/2017 1810   MCV 89 03/11/2017 1037   MCH 29.1 07/01/2017 1810   MCHC 32.1 07/01/2017 1810   RDW 12.9 07/01/2017 1810   RDW 13.7 03/11/2017 1037   LYMPHSABS 2.4 07/01/2017 1810   LYMPHSABS 2.5 03/11/2017 1037   MONOABS 0.5 07/01/2017 1810   EOSABS 0.1 07/01/2017 1810   EOSABS 0.1 03/11/2017 1037   BASOSABS 0.0 07/01/2017 1810   BASOSABS 0.0 03/11/2017 1037    CMP     Component Value Date/Time   NA 138 07/01/2017 1810   NA 144 03/11/2017 1037   K 3.7 07/01/2017 1810   CL 102 07/01/2017 1810   CO2 25 07/01/2017 1810   GLUCOSE 103 (H) 07/01/2017 1810   BUN 9 07/01/2017 1810   BUN 8 03/11/2017 1037   CREATININE 0.74 07/01/2017 1810   CREATININE 0.81 12/20/2015 1520    CALCIUM 8.3 (L) 07/01/2017 1810   PROT 6.1 03/11/2017 1037   ALBUMIN 4.0 03/11/2017 1037   AST 20 03/11/2017 1037   ALT 15 03/11/2017 1037   ALKPHOS 54 03/11/2017 1037   BILITOT 0.3 03/11/2017 1037   GFRNONAA >60 07/01/2017 1810   GFRNONAA 61 11/21/2012 1632   GFRAA >60 07/01/2017 1810   GFRAA 70 11/21/2012 1632   EXAM: CT ABDOMEN AND PELVIS WITH CONTRAST  TECHNIQUE: Multidetector CT imaging of the abdomen and pelvis was performed using the standard protocol following bolus administration of intravenous contrast.  CONTRAST:  158mL ISOVUE-300 IOPAMIDOL (ISOVUE-300) INJECTION 61%  COMPARISON:  CT abdomen and pelvis September 07, 2015  FINDINGS: LOWER CHEST: Bibasilar dependent atelectasis and scarring. Included heart size is normal. No pericardial effusion.  HEPATOBILIARY: Multiple subcentimeter gallstones without CT findings of acute cholecystitis. Normal liver.  PANCREAS: Normal.  SPLEEN: Normal.  ADRENALS/URINARY TRACT: Kidneys are orthotopic, demonstrating symmetric enhancement. No nephrolithiasis, hydronephrosis or solid renal masses. RIGHT extra renal pelvis. Delayed imaging through the kidneys demonstrates symmetric prompt contrast excretion within the proximal urinary collecting system. Urinary bladder is well distended and unremarkable. Mild lobulated contour LEFT adrenal gland without nodule by size criteria.  STOMACH/BOWEL: Small hiatal hernia. The stomach, small and large bowel are normal in course and caliber without inflammatory changes, sensitivity decreased without oral contrast. Circumferential rectal wall thickening without inflammatory changes. Moderate amount of retained large bowel stool to the level of the rectum with air-fluid levels. A few  scattered colonic diverticula present. Surgically absent appendix.  VASCULAR/LYMPHATIC: Aortoiliac vessels are normal in course and caliber. Moderate calcific atherosclerosis. No lymphadenopathy by  CT size criteria.  REPRODUCTIVE: Status post hysterectomy. 15 mm cyst on vaginal wall compatible with Bartholin cyst.  OTHER: No intraperitoneal free fluid or free air.  MUSCULOSKELETAL: Nonacute. Coarse calcification LEFT breast. Osteopenia. Severe L5-S1 degenerative disc, mild L5-S1 facet arthropathy. Osteopenia.  IMPRESSION: 1. No acute intra-abdominal or pelvic process. 2. Rectal wall thickening seen with internal hemorrhoids. If symptoms persists, recommend endoscopy. No CT findings of proctitis. 3. Moderate amount of retained large bowel stool without bowel obstruction.  Aortic Atherosclerosis (ICD10-I70.0).   Electronically Signed   By: Elon Alas M.D.   On: 07/01/2017 22:23  Assessment: 1.  Rectal bleeding: Exam in ER consistent with hemorrhoids, CT consistent with hemorrhoids, 2 episodes of bleeding and none since, no acute change in bowel habits or weight loss, recent colonoscopy in 2017; most likely hemorrhoids  Plan: 1.  Prescribed Hydrocortisone suppositories twice daily times 7 days with 1 refill. 2.  Recommend the patient increase fiber in her diet to at least 25-35 g/day with use of fiber supplement such as Metamucil or Benefiber.  She should also increase water intake to 6-8 8 ounce glasses per day. 3.  Patient was given strict ER return precautions.  Should she see any further bleeding she will let us know. 4.  Patient has an appt March 6th to see Dr. Hilarie Fredrickson and she would like to keep this.  Ellouise Newer, PA-C [facility] 07/05/2017, 3:37 PM  Cc: Chipper Herb, MD   Addendum: Reviewed and agree with initial management. Pyrtle, Lajuan Lines, MD

## 2017-07-12 ENCOUNTER — Ambulatory Visit: Payer: Medicare Other | Admitting: Physician Assistant

## 2017-07-14 ENCOUNTER — Other Ambulatory Visit (INDEPENDENT_AMBULATORY_CARE_PROVIDER_SITE_OTHER): Payer: Medicare Other

## 2017-07-14 ENCOUNTER — Encounter: Payer: Self-pay | Admitting: Internal Medicine

## 2017-07-14 ENCOUNTER — Ambulatory Visit: Payer: Medicare Other | Admitting: Internal Medicine

## 2017-07-14 VITALS — BP 192/72 | HR 80 | Ht 61.0 in | Wt 130.1 lb

## 2017-07-14 DIAGNOSIS — K52832 Lymphocytic colitis: Secondary | ICD-10-CM | POA: Diagnosis not present

## 2017-07-14 DIAGNOSIS — K602 Anal fissure, unspecified: Secondary | ICD-10-CM

## 2017-07-14 DIAGNOSIS — K648 Other hemorrhoids: Secondary | ICD-10-CM

## 2017-07-14 LAB — CBC WITH DIFFERENTIAL/PLATELET
BASOS PCT: 0.3 % (ref 0.0–3.0)
Basophils Absolute: 0 10*3/uL (ref 0.0–0.1)
Eosinophils Absolute: 0.1 10*3/uL (ref 0.0–0.7)
Eosinophils Relative: 1.9 % (ref 0.0–5.0)
HEMATOCRIT: 40.4 % (ref 36.0–46.0)
Hemoglobin: 13.7 g/dL (ref 12.0–15.0)
LYMPHS ABS: 2.8 10*3/uL (ref 0.7–4.0)
Lymphocytes Relative: 45.3 % (ref 12.0–46.0)
MCHC: 33.9 g/dL (ref 30.0–36.0)
MCV: 87.4 fl (ref 78.0–100.0)
MONOS PCT: 7 % (ref 3.0–12.0)
Monocytes Absolute: 0.4 10*3/uL (ref 0.1–1.0)
NEUTROS ABS: 2.8 10*3/uL (ref 1.4–7.7)
NEUTROS PCT: 45.5 % (ref 43.0–77.0)
PLATELETS: 255 10*3/uL (ref 150.0–400.0)
RBC: 4.61 Mil/uL (ref 3.87–5.11)
RDW: 13.6 % (ref 11.5–15.5)
WBC: 6.3 10*3/uL (ref 4.0–10.5)

## 2017-07-14 NOTE — Patient Instructions (Signed)
Please follow up with Dr Hilarie Fredrickson on 08/16/17 at 2:15pm  Please purchase the following medications over the counter and take as directed: benefiber  Please continue Lialda and Prep- H  Your physician has requested that you go to the basement for  lab work before leaving today:  If you are age 82 or older, your body mass index should be between 23-30. Your Body mass index is 24.59 kg/m. If this is out of the aforementioned range listed, please consider follow up with your Primary Care Provider.  If you are age 31 or younger, your body mass index should be between 19-25. Your Body mass index is 24.59 kg/m. If this is out of the aformentioned range listed, please consider follow up with your Primary Care Provider.

## 2017-07-14 NOTE — Progress Notes (Signed)
Subjective:    Patient ID: Rachel Walton, female    DOB: 1929-07-20, 84 y.o.   MRN: [id number]  HPI Oluwatosin Bracy is an 84 yo female with a history of lymphocytic colitis, long-standing IBS, colonic diverticulosis, remote colon polyps, GERD with Schatzki's ring, history of gastric ulcer who is here for follow-up.  I last saw her in May 2018 but she was seen recently on 07/05/2017 by Ellouise Newer, PA-C after developing rectal bleeding.  She is here alone today  Her visit with Anderson Malta followed an ER visit on 07/01/2017 where she presented after developing rectal bleeding associated with a bowel movement.  She had a CT scan which showed retained large bowel stool without obstruction.  There was suggestion of rectal wall thickening associated with internal hemorrhoids.  There was no obstruction or mass lesions.  No evidence for proctitis or colitis.  At that visit she was prescribed hydrocortisone suppositories, started on Benefiber.  She said she used one suppository but had lower center, abdominal pain which she associated with the suppository so she did not further use these.  She is been using Preparation H externally but just yesterday started using the applicator for internal medication delivery.  She states that after her ER visit she had not seen any further rectal bleeding until 2 days ago when she had a very small amount of red blood with wiping and a small amount of red blood visible in the toilet.  This was painless in nature.  For the last 2 days she has been having more soft formed bowel movements occurring twice daily but before this she was having more frequent small-volume stools associated with gas.  She would go to the bathroom 4-6 times per day including in the night.  She would often have a small amount of stool with urination.  She also started Benefiber recently.  Today she denies abdominal pain.  Appetite has remained very good.  No fevers or chills.  No urinary complaints.  She  has had nearly 10 days of a URI type symptoms predominantly rhinorrhea and some facial pressure.  No cough, chest pain or shortness of breath.  She has been using Robitussin-DM and Allegra.  She has continued with Lialda 2.4 g daily.  Review of Systems As per HPI, otherwise negative  Current Medications, Allergies, Past Medical History, Past Surgical History, Family History and Social History were reviewed in [employer] record.     Objective:   Physical Exam BP (!) 192/72 (BP Location: Left Arm, Patient Position: Sitting, Cuff Size: Normal)   Pulse 80   Ht 5\' 1"  (1.549 m)   Wt 130 lb 2 oz (59 kg)   BMI 24.59 kg/m  Constitutional: Well-developed and well-nourished. No distress. HEENT: Normocephalic and atraumatic.  Conjunctivae are normal.  No scleral icterus. Neck: Neck supple. Trachea midline. Cardiovascular: Normal rate, regular rhythm and intact distal pulses.  Pulmonary/chest: Effort normal and breath sounds normal. No wheezing, rales or rhonchi. Abdominal: Soft, nontender, nondistended. Bowel sounds active throughout. There are no masses palpable. No hepatosplenomegaly. ANOSCOPY: Using a disposable, lubricated, slotted, self-illuminating anoscope, the rectum was intubated without difficulty. The trochar was removed and the ano-rectum was circumferentially inspected. There were internal hemorrhoids, RA>LL=RP. There was a small superficial posterior anal fissure without significant tenderness/pain. The visualized rectal mucosa was not inflamed. No neoplasia or other pathology was identified. The inspection was well tolerated.  Extremities: no clubbing, cyanosis, or edema Neurological: Alert and oriented to person place and  time. Skin: Skin is warm and dry. Psychiatric: Normal mood and affect. Behavior is normal.  CBC    Component Value Date/Time   WBC 5.1 07/01/2017 1810   RBC 4.36 07/01/2017 1810   HGB 12.7 07/01/2017 1810   HGB 13.1 03/11/2017 1037    HCT 39.6 07/01/2017 1810   HCT 40.3 03/11/2017 1037   PLT 233 07/01/2017 1810   PLT 275 03/11/2017 1037   MCV 90.8 07/01/2017 1810   MCV 89 03/11/2017 1037   MCH 29.1 07/01/2017 1810   MCHC 32.1 07/01/2017 1810   RDW 12.9 07/01/2017 1810   RDW 13.7 03/11/2017 1037   LYMPHSABS 2.4 07/01/2017 1810   LYMPHSABS 2.5 03/11/2017 1037   MONOABS 0.5 07/01/2017 1810   EOSABS 0.1 07/01/2017 1810   EOSABS 0.1 03/11/2017 1037   BASOSABS 0.0 07/01/2017 1810   BASOSABS 0.0 03/11/2017 1037   CT ABDOMEN AND PELVIS WITH CONTRAST   TECHNIQUE: Multidetector CT imaging of the abdomen and pelvis was performed using the standard protocol following bolus administration of intravenous contrast.   CONTRAST:  158mL ISOVUE-300 IOPAMIDOL (ISOVUE-300) INJECTION 61%   COMPARISON:  CT abdomen and pelvis September 07, 2015   FINDINGS: LOWER CHEST: Bibasilar dependent atelectasis and scarring. Included heart size is normal. No pericardial effusion.   HEPATOBILIARY: Multiple subcentimeter gallstones without CT findings of acute cholecystitis. Normal liver.   PANCREAS: Normal.   SPLEEN: Normal.   ADRENALS/URINARY TRACT: Kidneys are orthotopic, demonstrating symmetric enhancement. No nephrolithiasis, hydronephrosis or solid renal masses. RIGHT extra renal pelvis. Delayed imaging through the kidneys demonstrates symmetric prompt contrast excretion within the proximal urinary collecting system. Urinary bladder is well distended and unremarkable. Mild lobulated contour LEFT adrenal gland without nodule by size criteria.   STOMACH/BOWEL: Small hiatal hernia. The stomach, small and large bowel are normal in course and caliber without inflammatory changes, sensitivity decreased without oral contrast. Circumferential rectal wall thickening without inflammatory changes. Moderate amount of retained large bowel stool to the level of the rectum with air-fluid levels. A few scattered colonic diverticula present.  Surgically absent appendix.   VASCULAR/LYMPHATIC: Aortoiliac vessels are normal in course and caliber. Moderate calcific atherosclerosis. No lymphadenopathy by CT size criteria.   REPRODUCTIVE: Status post hysterectomy. 15 mm cyst on vaginal wall compatible with Bartholin cyst.   OTHER: No intraperitoneal free fluid or free air.   MUSCULOSKELETAL: Nonacute. Coarse calcification LEFT breast. Osteopenia. Severe L5-S1 degenerative disc, mild L5-S1 facet arthropathy. Osteopenia.   IMPRESSION: 1. No acute intra-abdominal or pelvic process. 2. Rectal wall thickening seen with internal hemorrhoids. If symptoms persists, recommend endoscopy. No CT findings of proctitis. 3. Moderate amount of retained large bowel stool without bowel obstruction.   Aortic Atherosclerosis (ICD10-I70.0).     Electronically Signed   By: Elon Alas M.D.   On: 07/01/2017 22:23      Assessment & Plan:  84 yo female with a history of lymphocytic colitis, long-standing IBS, colonic diverticulosis, remote colon polyps, GERD with Schatzki's ring, history of gastric ulcer who is here for follow-up.   1.  Internal hemorrhoids/anal fissure --her rectal bleeding is felt secondary to internal hemorrhoids seen today during endoscopy.  These were previously seen at the time of her last colonoscopy which was performed by me in April 2017.  Internal hemorrhoids were suggested by CT scan but more importantly there was no other notable colonic pathology. --I have recommended that she continue the Preparation H per box instruction with the internal applicator --Continue Benefiber --Check CBC today --Follow-up  with me in 4-6 weeks.  She will keep a journal as to how many times she sees rectal bleeding until she follows up.  If she has high volume rectal bleeding she is asked to notify me immediately, she voices understanding.  We discussed hemorrhoidal banding today including the risks, benefits and alternatives.  If she  continues to have consistent bleeding I think hemorrhoidal banding would be the best treatment option.  2.  Lymphocytic colitis --she has not had much diarrhea and has seemed to do well with Lialda.  We will continue Lialda 2.4 g daily.  She was having some incomplete bowel movement and gas but I think Benefiber will help even things out.  She will continue Benefiber on a daily basis.  Follow-up in 4-6 weeks, sooner if needed 25 minutes spent with the patient today. Greater than 50% was spent in counseling and coordination of care with the patient

## 2017-07-28 ENCOUNTER — Other Ambulatory Visit: Payer: Self-pay | Admitting: Family Medicine

## 2017-08-03 ENCOUNTER — Ambulatory Visit: Payer: Medicare Other | Admitting: Family Medicine

## 2017-08-03 ENCOUNTER — Encounter: Payer: Self-pay | Admitting: Family Medicine

## 2017-08-03 VITALS — BP 162/70 | HR 63 | Temp 97.8°F | Ht 61.0 in | Wt 128.0 lb

## 2017-08-03 DIAGNOSIS — J301 Allergic rhinitis due to pollen: Secondary | ICD-10-CM

## 2017-08-03 DIAGNOSIS — E7800 Pure hypercholesterolemia, unspecified: Secondary | ICD-10-CM

## 2017-08-03 DIAGNOSIS — E559 Vitamin D deficiency, unspecified: Secondary | ICD-10-CM

## 2017-08-03 DIAGNOSIS — R251 Tremor, unspecified: Secondary | ICD-10-CM

## 2017-08-03 DIAGNOSIS — I10 Essential (primary) hypertension: Secondary | ICD-10-CM | POA: Diagnosis not present

## 2017-08-03 NOTE — Patient Instructions (Addendum)
Medicare Annual Wellness Visit  [facility] and the medical providers at [facility] strive to bring you the best medical care.  In doing so we not only want to address your current medical conditions and concerns but also to detect new conditions early and prevent illness, disease and health-related problems.    Medicare offers a yearly Wellness Visit which allows our clinical staff to assess your need for preventative services including immunizations, lifestyle education, counseling to decrease risk of preventable diseases and screening for fall risk and other medical concerns.    This visit is provided free of charge (no copay) for all Medicare recipients. The clinical pharmacists at [facility] have begun to conduct these Wellness Visits which will also include a thorough review of all your medications.    As you primary medical provider recommend that you make an appointment for your Annual Wellness Visit if you have not done so already this year.  You may set up this appointment before you leave today or you may call back ([phone]) and schedule an appointment.  Please make sure when you call that you mention that you are scheduling your Annual Wellness Visit with the clinical pharmacist so that the appointment may be made for the proper length of time.     Continue current medications. Continue good therapeutic lifestyle changes which include good diet and exercise. Fall precautions discussed with patient. If an FOBT was given today- please return it to our front desk. If you are over 45 years old - you may need Prevnar 86 or the adult Pneumonia vaccine.  **Flu shots are available--- please call and schedule a FLU-CLINIC appointment**  After your visit with [place] today you will receive a survey in the mail or online from [employer] regarding your care with [place]. Please take a moment to fill this out. Your feedback is very  important to [place] as you can help [place] better understand your patient needs as well as improve your experience and satisfaction. WE CARE ABOUT YOU!!!   Use Nasacort regularly Use nasal saline during the day Watch sodium intake Bring blood pressure readings by for review in about 4 weeks Follow-up with gastroenterology as planned When you get your eye exam make sure that we get a copy of this report

## 2017-08-03 NOTE — Progress Notes (Signed)
Subjective:    Patient ID: Rachel Walton, female    DOB: 1929/07/02, 84 y.o.   MRN: [id number]  HPI Pt here for follow up and management of chronic medical problems which includes hyperlipidemia and hypertension. She is taking medication regularly.  The patient today complains of some increased salivation and allergies.  She is due to get lab work and return in FOBT.  The patient's history includes Barrett's esophagus basal cell carcinoma of the nose diverticulosis esophageal stricture adenomatous colon polyps hyperlipidemia hypertension IBS and lymphocytic colitis.  She also has vitamin D deficiency.  Patient denies any chest pain or shortness of breath anymore than usual other than what is coming from her allergies.  She uses uses Nasacort and nasal saline.  She denies any nausea vomiting or blood in the stool and does see the gastroenterologist regularly because of her lymphocytic colitis.  She is doing better with this.  She does have a follow-up appointment with him soon.  She also denies any trouble with passing her water.  The initial blood pressure was elevated a repeat blood pressure using the left arm with a regular cuff was 162/70.  She says that her blood pressures at home consistently run in the 130s over the 70s.     Patient Active Problem List   Diagnosis Date Noted  . Osteopenia with high risk of fracture 10/21/2016  . Basal cell carcinoma of nose 02/05/2016  . Lymphocytic colitis 12/20/2015  . Barrett's esophagus 12/20/2015  . 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 Depression 11/07/2015  . Pain in the chest   . Chest pain 10/09/2015  . Hypokalemia 10/09/2015  . Anemia 10/09/2015  . Esophageal stricture 12/11/2013  . Allergic rhinitis due to pollen 12/11/2013  . Essential hypertension 12/11/2013  . Generalized anxiety disorder 08/21/2013  . Gall stones 06/15/2013  . Hyperlipemia 11/21/2012  . Tremor   . Postmenopausal   . Colon polyp   . Osteopenia of the elderly   .  Kyphosis   . Recurrent UTI   . Atrophic vaginitis   . Diverticulosis    Outpatient Encounter Medications as of 08/03/2017  Medication Sig  . Ca Phosphate-Cholecalciferol (CALTRATE GUMMY BITES PO) Take 2 drops by mouth daily.  . Calcium Carbonate-Vitamin D (CALCIUM 600+D) 600-200 MG-UNIT TABS Take 1 tablet by mouth daily. Take with a meal (Patient taking differently: Take 2 tablets by mouth daily. Take with a meal)  . clonazePAM (KLONOPIN) 0.5 MG tablet TAKE ONE TABLET TWICE DAILY AS NEEDED  . fluticasone (FLONASE) 50 MCG/ACT nasal spray Place 1 spray into both nostrils daily as needed for rhinitis.   . hydrocortisone (ANUSOL-HC) 2.5 % rectal cream Place 1 application rectally 2 (two) times daily.  . mesalamine (LIALDA) 1.2 g EC tablet TAKE 2 TABLETS EVERY MORNING WITH BREAKFAST  . potassium chloride (K-DUR) 10 MEQ tablet Take 1 Tablet by mouth once daily  . rosuvastatin (CRESTOR) 10 MG tablet Take 1 tablet (10 mg total) by mouth daily.  . shark liver oil-cocoa butter (PREPARATION H) 0.25-88.44 % suppository Place 1 suppository rectally as needed for hemorrhoids.  . Wheat Dextrin (BENEFIBER) POWD Take 1 Dose by mouth 2 (two) times daily.   No facility-administered encounter medications on file as of 08/03/2017.      Review of Systems  Constitutional: Negative.   HENT: Negative.        Increased saliva // seasonal allergies   Eyes: Negative.   Respiratory: Negative.   Cardiovascular: Negative.   Gastrointestinal: Negative.  Endocrine: Negative.   Genitourinary: Negative.   Musculoskeletal: Negative.   Skin: Negative.   Allergic/Immunologic: Negative.   Neurological: Negative.   Hematological: Negative.   Psychiatric/Behavioral: Negative.        Objective:   Physical Exam  Constitutional: She is oriented to person, place, and time. She appears well-developed and well-nourished. No distress.  The patient is pleasant and relaxed and has a slight tremor.  HENT:  Head:  Normocephalic and atraumatic.  Right Ear: External ear normal.  Left Ear: External ear normal.  Mouth/Throat: Oropharynx is clear and moist. No oropharyngeal exudate.  Nasal turbinate congestion bilaterally  Eyes: Pupils are equal, round, and reactive to light. Conjunctivae and EOM are normal. Right eye exhibits no discharge. Left eye exhibits no discharge. No scleral icterus.  Neck: Normal range of motion. Neck supple. No thyromegaly present.  No bruits thyromegaly or anterior cervical adenopathy  Cardiovascular: Normal rate, regular rhythm, normal heart sounds and intact distal pulses.  No murmur heard. The heart is regular at 72/min  Pulmonary/Chest: Effort normal and breath sounds normal. No respiratory distress. She has no wheezes. She has no rales.  Clear anteriorly and posteriorly  Abdominal: Soft. Bowel sounds are normal. She exhibits no mass. There is no tenderness. There is no rebound and no guarding.  No abdominal tenderness masses bruits organ enlargement inguinal adenopathy  Musculoskeletal: Normal range of motion. She exhibits no edema.  Lymphadenopathy:    She has no cervical adenopathy.  Neurological: She is alert and oriented to person, place, and time. She has normal reflexes. No cranial nerve deficit.  Tremor of head  Skin: Skin is warm and dry. No rash noted.  Psychiatric: She has a normal mood and affect. Her behavior is normal. Judgment and thought content normal.  Nursing note and vitals reviewed.  BP (!) 150/67 (BP Location: Left Arm)   Pulse 63   Temp 97.8 F (36.6 C) (Oral)   Ht 5' 1"  (1.549 m)   Wt 128 lb (58.1 kg)   BMI 24.19 kg/m         Assessment & Plan:  1.  Essential hypertension -I believe this is more white coat hypertension.  The patient does have normal blood pressures at home and she says they generally run between or in the 130s over the 70s and it was that way this morning.  The 2 readings today were both elevated on the systolic side.   Continue with her current medicine and bring readings by for review in about 4 weeks - CBC with Differential/Platelet - BMP8+EGFR - Hepatic function panel  2. Vitamin D deficiency -Continue with vitamin D replacement pending results of lab work - CBC with Differential/Platelet - VITAMIN D 25 Hydroxy (Vit-D Deficiency, Fractures)  3. Pure hypercholesterolemia -Continue with aggressive therapeutic lifestyle changes and current statin treatment pending results of lab work - CBC with Differential/Platelet - Lipid panel  4. Seasonal allergic rhinitis due to pollen -Continue with Nasacort and respiratory protection and nasal saline  5. Tremor -This is an ongoing problem.  She has seen the neurologist in the past.  Patient Instructions                       Medicare Annual Wellness Visit  [facility] and the medical providers at [facility] strive to bring you the best medical care.  In doing so we not only want to address your current medical conditions and concerns but also to detect new  conditions early and prevent illness, disease and health-related problems.    Medicare offers a yearly Wellness Visit which allows our clinical staff to assess your need for preventative services including immunizations, lifestyle education, counseling to decrease risk of preventable diseases and screening for fall risk and other medical concerns.    This visit is provided free of charge (no copay) for all Medicare recipients. The clinical pharmacists at [facility] have begun to conduct these Wellness Visits which will also include a thorough review of all your medications.    As you primary medical provider recommend that you make an appointment for your Annual Wellness Visit if you have not done so already this year.  You may set up this appointment before you leave today or you may call back ([phone]) and schedule an appointment.  Please make sure when you  call that you mention that you are scheduling your Annual Wellness Visit with the clinical pharmacist so that the appointment may be made for the proper length of time.     Continue current medications. Continue good therapeutic lifestyle changes which include good diet and exercise. Fall precautions discussed with patient. If an FOBT was given today- please return it to our front desk. If you are over 32 years old - you may need Prevnar 18 or the adult Pneumonia vaccine.  **Flu shots are available--- please call and schedule a FLU-CLINIC appointment**  After your visit with [place] today you will receive a survey in the mail or online from [employer] regarding your care with [place]. Please take a moment to fill this out. Your feedback is very important to [place] as you can help [place] better understand your patient needs as well as improve your experience and satisfaction. WE CARE ABOUT YOU!!!   Use Nasacort regularly Use nasal saline during the day Watch sodium intake Bring blood pressure readings by for review in about 4 weeks Follow-up with gastroenterology as planned When you get your eye exam make sure that we get a copy of this report  Arrie Senate MD

## 2017-08-04 LAB — BMP8+EGFR
BUN / CREAT RATIO: 10 — AB (ref 12–28)
BUN: 8 mg/dL (ref 8–27)
CO2: 26 mmol/L (ref 20–29)
Calcium: 8.7 mg/dL (ref 8.7–10.3)
Chloride: 102 mmol/L (ref 96–106)
Creatinine, Ser: 0.81 mg/dL (ref 0.57–1.00)
GFR, EST AFRICAN AMERICAN: 77 mL/min/{1.73_m2} (ref >59)
GFR, EST NON AFRICAN AMERICAN: 66 mL/min/{1.73_m2} (ref >59)
Glucose: 95 mg/dL (ref 65–99)
Potassium: 3.4 mmol/L — ABNORMAL LOW (ref 3.5–5.2)
Sodium: 143 mmol/L (ref 134–144)

## 2017-08-04 LAB — CBC WITH DIFFERENTIAL/PLATELET
BASOS: 1 %
Basophils Absolute: 0 10*3/uL (ref 0.0–0.2)
EOS (ABSOLUTE): 0.2 10*3/uL (ref 0.0–0.4)
EOS: 3 %
HEMATOCRIT: 38.4 % (ref 34.0–46.6)
Hemoglobin: 13.2 g/dL (ref 11.1–15.9)
IMMATURE GRANS (ABS): 0 10*3/uL (ref 0.0–0.1)
Immature Granulocytes: 0 %
Lymphocytes Absolute: 2.4 10*3/uL (ref 0.7–3.1)
Lymphs: 51 %
MCH: 30.1 pg (ref 26.6–33.0)
MCHC: 34.4 g/dL (ref 31.5–35.7)
MCV: 88 fL (ref 79–97)
MONOS ABS: 0.5 10*3/uL (ref 0.1–0.9)
Monocytes: 9 %
NEUTROS ABS: 1.7 10*3/uL (ref 1.4–7.0)
NEUTROS PCT: 36 %
PLATELETS: 244 10*3/uL (ref 150–379)
RBC: 4.39 x10E6/uL (ref 3.77–5.28)
RDW: 13.6 % (ref 12.3–15.4)
WBC: 4.8 10*3/uL (ref 3.4–10.8)

## 2017-08-04 LAB — HEPATIC FUNCTION PANEL
ALT: 10 IU/L (ref 0–32)
AST: 21 IU/L (ref 0–40)
Albumin: 3.9 g/dL (ref 3.5–4.7)
Alkaline Phosphatase: 50 IU/L (ref 39–117)
Bilirubin Total: 0.3 mg/dL (ref 0.0–1.2)
Bilirubin, Direct: 0.1 mg/dL (ref 0.00–0.40)
Total Protein: 5.9 g/dL — ABNORMAL LOW (ref 6.0–8.5)

## 2017-08-04 LAB — LIPID PANEL
CHOL/HDL RATIO: 3.4 ratio (ref 0.0–4.4)
Cholesterol, Total: 281 mg/dL — ABNORMAL HIGH (ref 100–199)
HDL: 83 mg/dL (ref >39)
LDL CALC: 169 mg/dL — AB (ref 0–99)
Triglycerides: 144 mg/dL (ref 0–149)
VLDL Cholesterol Cal: 29 mg/dL (ref 5–40)

## 2017-08-04 LAB — VITAMIN D 25 HYDROXY (VIT D DEFICIENCY, FRACTURES): VIT D 25 HYDROXY: 33.7 ng/mL (ref 30.0–100.0)

## 2017-08-16 ENCOUNTER — Encounter: Payer: Self-pay | Admitting: Internal Medicine

## 2017-08-16 ENCOUNTER — Ambulatory Visit: Payer: Medicare Other | Admitting: Internal Medicine

## 2017-08-16 VITALS — BP 138/70 | HR 68 | Ht 61.0 in | Wt 130.8 lb

## 2017-08-16 DIAGNOSIS — K648 Other hemorrhoids: Secondary | ICD-10-CM | POA: Diagnosis not present

## 2017-08-16 DIAGNOSIS — K52832 Lymphocytic colitis: Secondary | ICD-10-CM

## 2017-08-16 DIAGNOSIS — R12 Heartburn: Secondary | ICD-10-CM | POA: Diagnosis not present

## 2017-08-16 NOTE — Patient Instructions (Addendum)
If you are age 82 or older, your body mass index should be between 23-30. Your Body mass index is 24.71 kg/m. If this is out of the aforementioned range listed, please consider follow up with your Primary Care Provider.  If you are age 66 or younger, your body mass index should be between 19-25. Your Body mass index is 24.71 kg/m. If this is out of the aformentioned range listed, please consider follow up with your Primary Care Provider.   Continue taking your Lialda 2.4g daily. Continue taking your Benefiber.  Please purchase the following medications over the counter and take as directed: Zantac 150mg  1-2 times a day as needed for heartburn.  Follow-up in 6 months with Dr. Hilarie Fredrickson.  Thank you for choosing [facility], Dr. Hilarie Fredrickson

## 2017-08-16 NOTE — Progress Notes (Signed)
   Subjective:    Patient ID: Rachel Walton, female    DOB: [birth year], 84 y.o.   MRN: [id number]  HPI Madelein Mahadeo is an 84 year old female with a history of lymphocytic colitis, long-standing IBS, colonic diverticulosis, remote colon polyps, GERD with Schatzki's ring, history of gastric ulcer and internal hemorrhoids who is here for follow-up.  I last saw her one month ago.  She is here alone today.  At her last visit she was found to have internal hemorrhoids and a small anal fissure.  She used Preparation H with internal applicator as well as begin Benefiber.  She has had no further hemorrhoidal bleeding since her last follow-up.  She is feeling well.  She states that her colitis is in great control since starting Lialda and she continues 2.4 g daily.  Again no blood in her stool nor melena.  Denies abdominal pain.  Overall feeling very well.  She reports she very occasionally has heartburn and for this we will use Tums with good relief.  No dysphagia or odynophagia.  No hepatobiliary complaint.   Review of Systems As per HPI, otherwise negative  Current Medications, Allergies, Past Medical History, Past Surgical History, Family History and Social History were reviewed in [employer] record.     Objective:   Physical Exam BP 138/70   Pulse 68   Ht 5\' 1"  (1.549 m)   Wt 130 lb 12.8 oz (59.3 kg)   SpO2 96%   BMI 24.71 kg/m  Gen: awake, alert, NAD HEENT: anicteric, op clear CV: RRR Pulm: CTA b/l Abd: soft, NT/ND, +BS throughout Ext: no c/c/e Neuro: nonfocal      Assessment & Plan:  84 year old female with a history of lymphocytic colitis, long-standing IBS, colonic diverticulosis, remote colon polyps, GERD with Schatzki's ring, history of gastric ulcer and internal hemorrhoids who is here for follow-up  1.  Internal hemorrhoids/anal fissure --resolved with Preparation H and the addition of Benefiber.  Also controlling #2 below has improved the symptoms.   She will monitor for recurrent hemorrhoidal symptoms and notify me should these recur.  2.  Lymphocytic colitis --she has reached clinical remission with Lialda.  She is very happy with the efficacy of this medication.  We will continue Lialda 2.4 g daily.  3.  Heartburn --intermittent without alarm symptoms.  I suggested over-the-counter ranitidine 150 mg up to twice daily on an as-needed basis.  I asked that if heartburn is happening more frequently or not resolved completely with ranitidine that she notify me.  Office follow-up with me in about 6 months 15 minutes spent with the patient today. Greater than 50% was spent in counseling and coordination of care with the patient

## 2017-09-15 ENCOUNTER — Other Ambulatory Visit: Payer: Self-pay | Admitting: Family Medicine

## 2017-09-28 ENCOUNTER — Other Ambulatory Visit: Payer: Self-pay | Admitting: Family Medicine

## 2017-09-28 DIAGNOSIS — E876 Hypokalemia: Secondary | ICD-10-CM

## 2017-10-01 ENCOUNTER — Encounter: Payer: Self-pay | Admitting: Family Medicine

## 2017-10-01 ENCOUNTER — Ambulatory Visit: Payer: Medicare Other | Admitting: Family Medicine

## 2017-10-01 VITALS — BP 155/68 | HR 77 | Temp 97.6°F | Ht 61.0 in | Wt 129.6 lb

## 2017-10-01 DIAGNOSIS — N3001 Acute cystitis with hematuria: Secondary | ICD-10-CM

## 2017-10-01 LAB — URINALYSIS, COMPLETE
BILIRUBIN UA: NEGATIVE
Glucose, UA: NEGATIVE
KETONES UA: NEGATIVE
Nitrite, UA: NEGATIVE
PH UA: 5.5 (ref 5.0–7.5)
SPEC GRAV UA: 1.01 (ref 1.005–1.030)
UUROB: 0.2 mg/dL (ref 0.2–1.0)

## 2017-10-01 LAB — MICROSCOPIC EXAMINATION
Epithelial Cells (non renal): NONE SEEN /hpf (ref 0–10)
RENAL EPITHEL UA: NONE SEEN /HPF

## 2017-10-01 MED ORDER — CIPROFLOXACIN HCL 500 MG PO TABS: 500.0000 mg | ORAL_TABLET | Freq: Two times a day (BID) | ORAL | 0 refills | Status: DC

## 2017-10-01 NOTE — Patient Instructions (Signed)

## 2017-10-01 NOTE — Progress Notes (Signed)
   HPI  Patient presents today for concern for UTI.  Patient reports about 1 week of suprapubic pressure and increased urinary frequency.  Patient states she is also had some urinary leakage that is unusual.  She has had mild chills.  She denies fever, sweats, or difficulty tolerating foods or fluids.  She states that she has only tolerated Cipro in the past.  She reports a sulfa allergy.  PMH: Smoking status noted ROS: Per HPI  Objective: BP (!) 155/68   Pulse 77   Temp 97.6 F (36.4 C) (Oral)   Ht 5\' 1"  (1.549 m)   Wt 129 lb 9.6 oz (58.8 kg)   BMI 24.49 kg/m  Gen: NAD, alert, cooperative with exam HEENT: NCAT CV: RRR, good S1/S2, no murmur Resp: CTABL, no wheezes, non-labored Abd: Suprapubic tenderness to palpation, no CVA tenderness Ext: No edema, warm Neuro: Alert and oriented, No gross deficits  Assessment and plan:  #UTI No red flags, treating is complicated given age Cipro 500 twice daily as requested by patient Culture    Orders Placed This Encounter  Procedures  . Urine Culture  . Urinalysis, Complete    Meds ordered this encounter  Medications  . ciprofloxacin (CIPRO) 500 MG tablet    Sig: Take 1 tablet (500 mg total) by mouth 2 (two) times daily.    Dispense:  14 tablet    Refill:  0    Laroy Apple, MD [facility] 10/01/2017, 10:18 AM

## 2017-10-03 LAB — URINE CULTURE

## 2017-10-04 ENCOUNTER — Encounter (HOSPITAL_COMMUNITY): Payer: Self-pay | Admitting: Emergency Medicine

## 2017-10-04 ENCOUNTER — Emergency Department (HOSPITAL_COMMUNITY)
Admission: EM | Admit: 2017-10-04 | Discharge: 2017-10-04 | Disposition: A | Discharge status: Home / Self Care | Payer: Medicare Other | Attending: Emergency Medicine | Admitting: Emergency Medicine

## 2017-10-04 ENCOUNTER — Other Ambulatory Visit: Payer: Self-pay

## 2017-10-04 DIAGNOSIS — W57XXXA Bitten or stung by nonvenomous insect and other nonvenomous arthropods, initial encounter: Secondary | ICD-10-CM | POA: Insufficient documentation

## 2017-10-04 DIAGNOSIS — Y999 Unspecified external cause status: Secondary | ICD-10-CM | POA: Diagnosis not present

## 2017-10-04 DIAGNOSIS — Y939 Activity, unspecified: Secondary | ICD-10-CM | POA: Diagnosis not present

## 2017-10-04 DIAGNOSIS — Y929 Unspecified place or not applicable: Secondary | ICD-10-CM | POA: Diagnosis not present

## 2017-10-04 DIAGNOSIS — S90562A Insect bite (nonvenomous), left ankle, initial encounter: Secondary | ICD-10-CM | POA: Diagnosis not present

## 2017-10-04 DIAGNOSIS — Z5321 Procedure and treatment not carried out due to patient leaving prior to being seen by health care provider: Secondary | ICD-10-CM | POA: Diagnosis not present

## 2017-10-04 NOTE — ED Triage Notes (Signed)
Patient complaining of tick bite to left ankle yesterday. No complaints today. Small red area to left ankle.

## 2017-10-04 NOTE — ED Notes (Signed)
Notified by registration that patient left. Patient left after triage but before being placed in a room or seen by physician. 

## 2017-10-05 ENCOUNTER — Other Ambulatory Visit: Payer: Medicare Other

## 2017-10-05 ENCOUNTER — Telehealth: Payer: Self-pay | Admitting: Family Medicine

## 2017-10-05 DIAGNOSIS — Z1211 Encounter for screening for malignant neoplasm of colon: Secondary | ICD-10-CM

## 2017-10-05 NOTE — Telephone Encounter (Signed)
Please have patient come in and get titers for Lyme and [facility] spotted fever and go ahead and finish the Cipro that she is on and we will discuss whether or not to start the doxycycline or wait until after the blood work is returned

## 2017-10-05 NOTE — ED Notes (Signed)
Follow up call made  No answer  [id number]  10/05/17  s Annel Zunker rn

## 2017-10-05 NOTE — Telephone Encounter (Signed)
Pt is on cipro - will finish on 5/30 (for UTI)  She pulled a deer tick off her on Sunday - she says the spot is a little painful May need doxy - can we order and her start it on WED Pm after completing the Cipro?

## 2017-10-06 ENCOUNTER — Other Ambulatory Visit: Payer: Medicare Other

## 2017-10-06 ENCOUNTER — Other Ambulatory Visit: Payer: Self-pay | Admitting: *Deleted

## 2017-10-06 DIAGNOSIS — W57XXXA Bitten or stung by nonvenomous insect and other nonvenomous arthropods, initial encounter: Secondary | ICD-10-CM

## 2017-10-06 NOTE — Telephone Encounter (Signed)
Pt aware lab orders placed.

## 2017-10-07 LAB — FECAL OCCULT BLOOD, IMMUNOCHEMICAL: Fecal Occult Bld: NEGATIVE

## 2017-10-08 ENCOUNTER — Other Ambulatory Visit: Payer: Self-pay | Admitting: *Deleted

## 2017-10-08 LAB — ROCKY MTN SPOTTED FVR ABS PNL(IGG+IGM)
RMSF IgG: NEGATIVE
RMSF IgM: 0.14 index (ref 0.00–0.89)

## 2017-10-08 LAB — LYME AB/WESTERN BLOT REFLEX: LYME DISEASE AB, QUANT, IGM: <0.8 index (ref 0.00–0.79)

## 2017-10-08 MED ORDER — DOXYCYCLINE HYCLATE 100 MG PO TABS: 100.0000 mg | ORAL_TABLET | Freq: Two times a day (BID) | ORAL | 0 refills | Status: DC

## 2017-11-19 ENCOUNTER — Other Ambulatory Visit: Payer: Self-pay | Admitting: Family Medicine

## 2017-12-16 ENCOUNTER — Ambulatory Visit: Payer: Medicare Other | Admitting: Family Medicine

## 2017-12-16 ENCOUNTER — Encounter: Payer: Self-pay | Admitting: Family Medicine

## 2017-12-16 VITALS — BP 154/63 | HR 63 | Temp 97.3°F | Ht 61.0 in | Wt 129.0 lb

## 2017-12-16 DIAGNOSIS — R251 Tremor, unspecified: Secondary | ICD-10-CM

## 2017-12-16 DIAGNOSIS — E7800 Pure hypercholesterolemia, unspecified: Secondary | ICD-10-CM

## 2017-12-16 DIAGNOSIS — E559 Vitamin D deficiency, unspecified: Secondary | ICD-10-CM

## 2017-12-16 DIAGNOSIS — J301 Allergic rhinitis due to pollen: Secondary | ICD-10-CM

## 2017-12-16 DIAGNOSIS — I10 Essential (primary) hypertension: Secondary | ICD-10-CM | POA: Diagnosis not present

## 2017-12-16 DIAGNOSIS — R531 Weakness: Secondary | ICD-10-CM | POA: Diagnosis not present

## 2017-12-16 NOTE — Progress Notes (Signed)
Subjective:    Patient ID: Rachel Walton, female    DOB: 1929/10/03, 84 y.o.   MRN: [id number]  HPI Pt here for follow up and management of chronic medical problems which includes hypertension and hyperlipidemia. She is taking medication regularly.  The patient today is concerned because she is complaining of some weakness in general.  She also goes to the bathroom to pass her water and has some occasional fecal incontinence and gas.  She has seen the gastroenterologist and discussed this with him and he encouraged her to take more fiber.  She is due to get her mammogram and will get lab work today.  Her vital signs are stable except that her systolic blood pressure is elevated at 154.  Pleasant and smiling.  She is under a lot of stress because her son from [place] is visiting with his to children and wife and it is very stressful when they are here.  The patient realizes she has to accept this stress and pray for her children and grandchildren and daughter-in-law and deal with the best I can walk with a are visiting them.  Today she denies any chest pain pressure tightness or shortness of breath.  She denies any trouble with swallowing heartburn indigestion nausea vomiting diarrhea blood in the stool or black tarry bowel movements.  She does indicate that at times when she goes to pee that she will have some fecal incontinence.  She has had a hysterectomy and has had 3 sons and we talked about how the structure in the pelvic area changes with childbirth and hysterectomy and she understands this.  She also does a lot of milk cheese ice cream and dairy products and will try to reduce these in her diet and take and eat foods that have more fiber in them.  She otherwise is passing her water well and has no burning pain or frequency.  She says that her blood pressures at home of been running in the 130s over the 70 range.  She recently had an eye exam at [facility] eye  physicians.     Patient Active Problem List   Diagnosis Date Noted  . Osteopenia with high risk of fracture 10/21/2016  . Basal cell carcinoma of nose 02/05/2016  . Lymphocytic colitis 12/20/2015  . Barrett's esophagus 12/20/2015  . 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 Depression 11/07/2015  . Pain in the chest   . Chest pain 10/09/2015  . Hypokalemia 10/09/2015  . Anemia 10/09/2015  . Esophageal stricture 12/11/2013  . Allergic rhinitis due to pollen 12/11/2013  . Essential hypertension 12/11/2013  . Generalized anxiety disorder 08/21/2013  . Gall stones 06/15/2013  . Hyperlipemia 11/21/2012  . Tremor   . Postmenopausal   . Colon polyp   . Osteopenia of the elderly   . Kyphosis   . Recurrent UTI   . Atrophic vaginitis   . Diverticulosis    Outpatient Encounter Medications as of 12/16/2017  Medication Sig  . Calcium Carbonate-Vitamin D (CALCIUM 600+D) 600-200 MG-UNIT TABS Take 1 tablet by mouth daily. Take with a meal (Patient taking differently: Take 2 tablets by mouth daily. Take with a meal)  . clonazePAM (KLONOPIN) 0.5 MG tablet TAKE ONE TABLET TWICE DAILY AS NEEDED  . fluticasone (FLONASE) 50 MCG/ACT nasal spray Place 1 spray into both nostrils daily as needed for rhinitis.   Marland Kitchen mesalamine (LIALDA) 1.2 g EC tablet TAKE 2 TABLETS EVERY MORNING WITH BREAKFAST  . Multiple Vitamins-Minerals (ICAPS AREDS 2) CHEW  Chew 2 capsules by mouth daily.  . potassium chloride (K-DUR) 10 MEQ tablet TAKE ONE (1) TABLET EACH DAY  . propranolol (INDERAL) 10 MG tablet Take 10 mg by mouth daily.  . rosuvastatin (CRESTOR) 10 MG tablet Take 1 tablet (10 mg total) by mouth daily.  . Wheat Dextrin (BENEFIBER) POWD Take 1 Dose by mouth 2 (two) times daily.  . [DISCONTINUED] Ca Phosphate-Cholecalciferol (CALTRATE GUMMY BITES PO) Take 2 drops by mouth daily.  . [DISCONTINUED] ciprofloxacin (CIPRO) 500 MG tablet Take 1 tablet (500 mg total) by mouth 2 (two) times daily.  . [DISCONTINUED] doxycycline  (VIBRA-TABS) 100 MG tablet Take 1 tablet (100 mg total) by mouth 2 (two) times daily. 1 po bid   No facility-administered encounter medications on file as of 12/16/2017.      Review of Systems  Constitutional: Negative.   HENT: Negative.   Eyes: Negative.   Respiratory: Negative.   Cardiovascular: Negative.   Gastrointestinal: Negative.        Gas . BM issues  Endocrine: Negative.   Genitourinary: Negative.        Bladder leakage  Musculoskeletal: Negative.   Skin: Negative.   Allergic/Immunologic: Negative.   Neurological: Positive for weakness.  Hematological: Negative.   Psychiatric/Behavioral: Negative.        Objective:   Physical Exam  Constitutional: She is oriented to person, place, and time. She appears well-developed and well-nourished. No distress.  Despite the current stress in her life patient seems to be calm and smiling and recognizes that it will only last for short period of time.  HENT:  Head: Normocephalic and atraumatic.  Right Ear: External ear normal.  Left Ear: External ear normal.  Mouth/Throat: Oropharynx is clear and moist.  Nasal turbinate congestion bilaterally  Eyes: Pupils are equal, round, and reactive to light. Conjunctivae and EOM are normal. Right eye exhibits no discharge. Left eye exhibits no discharge. No scleral icterus.  Is up-to-date on eye exams  Neck: Normal range of motion. Neck supple. No thyromegaly present.  No thyromegaly bruits or anterior cervical adenopathy  Cardiovascular: Normal rate, regular rhythm, normal heart sounds and intact distal pulses.  No murmur heard. Heart is regular at 60/min good pedal pulses  Pulmonary/Chest: Effort normal and breath sounds normal. She has no wheezes. She has no rales.  Clear anteriorly and posteriorly  Abdominal: Soft. Bowel sounds are normal. She exhibits no mass. There is no tenderness.  No liver or spleen enlargement epigastric tenderness masses or suprapubic tenderness.   Musculoskeletal: Normal range of motion. She exhibits no edema.  Lymphadenopathy:    She has no cervical adenopathy.  Neurological: She is alert and oriented to person, place, and time. She has normal reflexes. No cranial nerve deficit.  The patient is alert with good mobility and normal reflexes bilaterally.  Skin: Skin is warm and dry. No rash noted.  Psychiatric: She has a normal mood and affect. Her behavior is normal. Judgment and thought content normal.  Mood and affect are positive and normal despite the stress that she is dealing with at home currently.  Nursing note and vitals reviewed.  BP (!) 154/63 (BP Location: Left Arm)   Pulse 63   Temp (!) 97.3 F (36.3 C) (Oral)   Ht 5' 1"  (1.549 m)   Wt 129 lb (58.5 kg)   BMI 24.37 kg/m         Assessment & Plan:  1. Pure hypercholesterolemia -Continue current treatment and as aggressive therapeutic lifestyle changes as  possible for her age pending results of lab work - CBC with Differential/Platelet - Lipid panel  2. Vitamin D deficiency -Continue vitamin D replacement pending results of lab work - CBC with Differential/Platelet - VITAMIN D 25 Hydroxy (Vit-D Deficiency, Fractures)  3. Essential hypertension -Watch sodium intake monitor blood pressures at home and stay active physically and drink plenty of water - BMP8+EGFR - CBC with Differential/Platelet - Hepatic function panel  4. Weakness -Check B12 level - Thyroid Panel With TSH  5. Tremor -Follow-up with neurology as planned  6. Seasonal allergic rhinitis due to pollen -Use Allegra Flonase and nasal saline  Patient Instructions                       Medicare Annual Wellness Visit  [facility] and the medical providers at [facility] strive to bring you the best medical care.  In doing so we not only want to address your current medical conditions and concerns but also to detect new conditions early and prevent illness, disease and  health-related problems.    Medicare offers a yearly Wellness Visit which allows our clinical staff to assess your need for preventative services including immunizations, lifestyle education, counseling to decrease risk of preventable diseases and screening for fall risk and other medical concerns.    This visit is provided free of charge (no copay) for all Medicare recipients. The clinical pharmacists at [facility] have begun to conduct these Wellness Visits which will also include a thorough review of all your medications.    As you primary medical provider recommend that you make an appointment for your Annual Wellness Visit if you have not done so already this year.  You may set up this appointment before you leave today or you may call back ([phone]) and schedule an appointment.  Please make sure when you call that you mention that you are scheduling your Annual Wellness Visit with the clinical pharmacist so that the appointment may be made for the proper length of time.     Continue current medications. Continue good therapeutic lifestyle changes which include good diet and exercise. Fall precautions discussed with patient. If an FOBT was given today- please return it to our front desk. If you are over 35 years old - you may need Prevnar 98 or the adult Pneumonia vaccine.  **Flu shots are available--- please call and schedule a FLU-CLINIC appointment**  After your visit with [place] today you will receive a survey in the mail or online from [employer] regarding your care with [place]. Please take a moment to fill this out. Your feedback is very important to [place] as you can help [place] better understand your patient needs as well as improve your experience and satisfaction. WE CARE ABOUT YOU!!!   Stay active physically Add more bulk and fiber to the diet Avoid milk cheese ice cream and dairy products Drink plenty of water Pray for all of the stress in your life for it to be  removed as much as possible Continue to use Flonase Allegra and nasal saline  Arrie Senate MD

## 2017-12-16 NOTE — Patient Instructions (Addendum)
Medicare Annual Wellness Visit  [facility] and the medical providers at [facility] strive to bring you the best medical care.  In doing so we not only want to address your current medical conditions and concerns but also to detect new conditions early and prevent illness, disease and health-related problems.    Medicare offers a yearly Wellness Visit which allows our clinical staff to assess your need for preventative services including immunizations, lifestyle education, counseling to decrease risk of preventable diseases and screening for fall risk and other medical concerns.    This visit is provided free of charge (no copay) for all Medicare recipients. The clinical pharmacists at [facility] have begun to conduct these Wellness Visits which will also include a thorough review of all your medications.    As you primary medical provider recommend that you make an appointment for your Annual Wellness Visit if you have not done so already this year.  You may set up this appointment before you leave today or you may call back ([phone]) and schedule an appointment.  Please make sure when you call that you mention that you are scheduling your Annual Wellness Visit with the clinical pharmacist so that the appointment may be made for the proper length of time.     Continue current medications. Continue good therapeutic lifestyle changes which include good diet and exercise. Fall precautions discussed with patient. If an FOBT was given today- please return it to our front desk. If you are over 61 years old - you may need Prevnar 52 or the adult Pneumonia vaccine.  **Flu shots are available--- please call and schedule a FLU-CLINIC appointment**  After your visit with [place] today you will receive a survey in the mail or online from [employer] regarding your care with [place]. Please take a moment to fill this out. Your feedback is very  important to [place] as you can help [place] better understand your patient needs as well as improve your experience and satisfaction. WE CARE ABOUT YOU!!!   Stay active physically Add more bulk and fiber to the diet Avoid milk cheese ice cream and dairy products Drink plenty of water Pray for all of the stress in your life for it to be removed as much as possible Continue to use Flonase Allegra and nasal saline

## 2017-12-17 LAB — CBC WITH DIFFERENTIAL/PLATELET
BASOS ABS: 0.1 10*3/uL (ref 0.0–0.2)
BASOS: 1 %
EOS (ABSOLUTE): 0.2 10*3/uL (ref 0.0–0.4)
Eos: 3 %
HEMOGLOBIN: 13.2 g/dL (ref 11.1–15.9)
Hematocrit: 40.3 % (ref 34.0–46.6)
Immature Grans (Abs): 0 10*3/uL (ref 0.0–0.1)
Immature Granulocytes: 0 %
Lymphocytes Absolute: 2.5 10*3/uL (ref 0.7–3.1)
Lymphs: 46 %
MCH: 28.6 pg (ref 26.6–33.0)
MCHC: 32.8 g/dL (ref 31.5–35.7)
MCV: 87 fL (ref 79–97)
MONOCYTES: 9 %
Monocytes Absolute: 0.5 10*3/uL (ref 0.1–0.9)
NEUTROS ABS: 2.2 10*3/uL (ref 1.4–7.0)
Neutrophils: 41 %
Platelets: 289 10*3/uL (ref 150–450)
RBC: 4.62 x10E6/uL (ref 3.77–5.28)
RDW: 12.8 % (ref 12.3–15.4)
WBC: 5.5 10*3/uL (ref 3.4–10.8)

## 2017-12-17 LAB — HEPATIC FUNCTION PANEL
ALT: 13 IU/L (ref 0–32)
AST: 17 IU/L (ref 0–40)
Albumin: 4.2 g/dL (ref 3.5–4.7)
Alkaline Phosphatase: 57 IU/L (ref 39–117)
BILIRUBIN, DIRECT: 0.11 mg/dL (ref 0.00–0.40)
Bilirubin Total: 0.5 mg/dL (ref 0.0–1.2)
TOTAL PROTEIN: 6.2 g/dL (ref 6.0–8.5)

## 2017-12-17 LAB — BMP8+EGFR
BUN/Creatinine Ratio: 10 — ABNORMAL LOW (ref 12–28)
BUN: 8 mg/dL (ref 8–27)
CHLORIDE: 103 mmol/L (ref 96–106)
CO2: 25 mmol/L (ref 20–29)
Calcium: 9 mg/dL (ref 8.7–10.3)
Creatinine, Ser: 0.81 mg/dL (ref 0.57–1.00)
GFR calc non Af Amer: 66 mL/min/{1.73_m2} (ref >59)
GFR, EST AFRICAN AMERICAN: 77 mL/min/{1.73_m2} (ref >59)
Glucose: 90 mg/dL (ref 65–99)
POTASSIUM: 4.2 mmol/L (ref 3.5–5.2)
Sodium: 143 mmol/L (ref 134–144)

## 2017-12-17 LAB — LIPID PANEL
CHOLESTEROL TOTAL: 290 mg/dL — AB (ref 100–199)
Chol/HDL Ratio: 3.4 ratio (ref 0.0–4.4)
HDL: 85 mg/dL (ref >39)
LDL CALC: 171 mg/dL — AB (ref 0–99)
Triglycerides: 168 mg/dL — ABNORMAL HIGH (ref 0–149)
VLDL Cholesterol Cal: 34 mg/dL (ref 5–40)

## 2017-12-17 LAB — THYROID PANEL WITH TSH
Free Thyroxine Index: 1.9 (ref 1.2–4.9)
T3 Uptake Ratio: 25 % (ref 24–39)
T4, Total: 7.6 ug/dL (ref 4.5–12.0)
TSH: 2.08 u[IU]/mL (ref 0.450–4.500)

## 2017-12-17 LAB — VITAMIN D 25 HYDROXY (VIT D DEFICIENCY, FRACTURES): Vit D, 25-Hydroxy: 30.1 ng/mL (ref 30.0–100.0)

## 2017-12-17 LAB — VITAMIN B12: VITAMIN B 12: 335 pg/mL (ref 232–1245)

## 2017-12-30 ENCOUNTER — Other Ambulatory Visit: Payer: Self-pay

## 2017-12-30 ENCOUNTER — Encounter (HOSPITAL_COMMUNITY): Payer: Self-pay | Admitting: Emergency Medicine

## 2017-12-30 ENCOUNTER — Emergency Department (HOSPITAL_COMMUNITY)
Admission: EM | Admit: 2017-12-30 | Discharge: 2017-12-30 | Disposition: A | Discharge status: Home / Self Care | Payer: Medicare Other | Attending: Emergency Medicine | Admitting: Emergency Medicine

## 2017-12-30 ENCOUNTER — Emergency Department (HOSPITAL_COMMUNITY): Payer: Medicare Other

## 2017-12-30 DIAGNOSIS — R079 Chest pain, unspecified: Secondary | ICD-10-CM | POA: Diagnosis present

## 2017-12-30 DIAGNOSIS — Z79899 Other long term (current) drug therapy: Secondary | ICD-10-CM | POA: Insufficient documentation

## 2017-12-30 DIAGNOSIS — I10 Essential (primary) hypertension: Secondary | ICD-10-CM | POA: Diagnosis not present

## 2017-12-30 LAB — BASIC METABOLIC PANEL
Anion gap: 5 (ref 5–15)
BUN: 8 mg/dL (ref 8–23)
CO2: 30 mmol/L (ref 22–32)
Calcium: 8.7 mg/dL — ABNORMAL LOW (ref 8.9–10.3)
Chloride: 107 mmol/L (ref 98–111)
Creatinine, Ser: 0.77 mg/dL (ref 0.44–1.00)
GFR calc Af Amer: >60 mL/min (ref >60)
GFR calc non Af Amer: >60 mL/min (ref >60)
Glucose, Bld: 95 mg/dL (ref 70–99)
Potassium: 3.2 mmol/L — ABNORMAL LOW (ref 3.5–5.1)
Sodium: 142 mmol/L (ref 135–145)

## 2017-12-30 LAB — CBC
HCT: 40 % (ref 36.0–46.0)
Hemoglobin: 13.1 g/dL (ref 12.0–15.0)
MCH: 29.2 pg (ref 26.0–34.0)
MCHC: 32.8 g/dL (ref 30.0–36.0)
MCV: 89.1 fL (ref 78.0–100.0)
Platelets: 218 10*3/uL (ref 150–400)
RBC: 4.49 MIL/uL (ref 3.87–5.11)
RDW: 13.3 % (ref 11.5–15.5)
WBC: 5.6 10*3/uL (ref 4.0–10.5)

## 2017-12-30 LAB — TROPONIN I: Troponin I: <0.03 ng/mL (ref <0.03)

## 2017-12-30 NOTE — ED Triage Notes (Signed)
Pt c/o sudden onset of LT sided chest pain (Lt breast/ribcage). Reports sharp pain has subsided, but still has tightness. Also endorses generalized weakness. NAD noted.

## 2018-01-05 NOTE — ED Provider Notes (Signed)
[facility] EMERGENCY DEPARTMENT Provider Note   CSN: [id number] Arrival date & time: 12/30/17  1630     History   Chief Complaint Chief Complaint  Patient presents with  . Chest Pain    HPI Rachel Walton is a 84 y.o. female.  HPI  84 year old female with chest pain.  Sharp at onset very early this morning.  Persistent throughout the day although improving the point of almost subsiding but still has "tightness."  Does not radiate.  No respiratory complaints.  No diaphoresis.  No palpitations.  No unusual leg pain or swelling.  No fevers or chills.  No cough.  She has some mild generalized weakness.  Denies any focal deficits.  Past Medical History:  Diagnosis Date  . Acute gastric ulcer   . Allergy   . Anxiety   . Atrophic vaginitis   . Barrett's esophagus   . Basal cell carcinoma of nose     under nose  . Cataract   . Depression   . Diverticulosis   . Diverticulosis   . Esophageal stricture   . Esophageal ulcer   . Fatty liver   . GERD (gastroesophageal reflux disease)   . Heart murmur   . Hiatal hernia   . Hx of adenomatous colonic polyps   . Hyperlipidemia   . Hypertension   . Hypokalemia   . IBS (irritable bowel syndrome)   . Kyphosis   . Lymphocytic colitis   . Macular degeneration    "?wet or dry; ? both eyes"  . 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 Osteopenia of the elderly   . Pneumonia X 1  . Postmenopausal   . Recurrent UTI   . 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 Tremor   . Vitamin D deficiency     Patient Active Problem List   Diagnosis Date Noted  . Osteopenia with high risk of fracture 10/21/2016  . Basal cell carcinoma of nose 02/05/2016  . Lymphocytic colitis 12/20/2015  . Barrett's esophagus 12/20/2015  . 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 Depression 11/07/2015  . Pain in the chest   . Chest pain 10/09/2015  . Hypokalemia 10/09/2015  . Anemia 10/09/2015  . Esophageal stricture 12/11/2013  . Allergic  rhinitis due to pollen 12/11/2013  . Essential hypertension 12/11/2013  . Generalized anxiety disorder 08/21/2013  . Gall stones 06/15/2013  . Hyperlipemia 11/21/2012  . Tremor   . Postmenopausal   . Colon polyp   . Osteopenia of the elderly   . Kyphosis   . Recurrent UTI   . Atrophic vaginitis   . Diverticulosis     Past Surgical History:  Procedure Laterality Date  . ABDOMINAL HYSTERECTOMY  1980  . APPENDECTOMY    . BACK SURGERY    . BREAST BIOPSY Left   . CATARACT EXTRACTION W/ INTRAOCULAR LENS  IMPLANT, BILATERAL Bilateral   . COLONOSCOPY    . ESOPHAGOGASTRODUODENOSCOPY (EGD) WITH ESOPHAGEAL DILATION  X 1  . EYE SURGERY    . LUMBAR DISC SURGERY     "fragmented disc"  . POLYPECTOMY    . SQUAMOUS CELL CARCINOMA EXCISION     nose tip     OB History   None      Home Medications    Prior to Admission medications   Medication Sig Start Date End Date Taking? Authorizing Provider  Calcium Carbonate-Vitamin D (CALCIUM 600+D) 600-200 MG-UNIT TABS Take 1 tablet by mouth daily. Take with a meal Patient taking  differently: Take 1-2 tablets by mouth daily. Take with a meal 12/30/15  Yes Eckard, Tammy, PharmD  clonazePAM (KLONOPIN) 0.5 MG tablet TAKE ONE TABLET TWICE DAILY AS NEEDED Patient taking differently: Take 0.25-0.5 mg by mouth 2 (two) times daily.  09/15/17  Yes Chipper Herb, MD  fluticasone [facility]) 50 MCG/ACT nasal spray Place 1 spray into both nostrils daily as needed for rhinitis.  04/10/14  Yes [provider]  mesalamine (LIALDA) 1.2 g EC tablet TAKE 2 TABLETS EVERY MORNING WITH BREAKFAST Patient taking differently: Take 2.4 g by mouth daily with breakfast.  11/22/17  Yes Timmothy Euler, MD  Multiple Vitamins-Minerals (PRESERVISION AREDS 2) CHEW Chew 2 each by mouth daily.   Yes [provider]  potassium chloride (K-DUR) 10 MEQ tablet TAKE ONE (1) TABLET EACH DAY Patient taking differently: Take 10 mEq by mouth daily.  09/28/17  Yes Chipper Herb, MD  propranolol (INDERAL) 10 MG tablet Take 10 mg by mouth daily.   Yes [provider]  Wheat Dextrin (BENEFIBER) POWD Take 1 Dose by mouth daily as needed.    Yes [provider]  rosuvastatin (CRESTOR) 10 MG tablet Take 1 tablet (10 mg total) by mouth daily. Patient not taking: Reported on 12/30/2017 03/11/17   Chipper Herb, MD    Family History Family History  Problem Relation Age of Onset  . Stroke Mother   . Heart disease Father        MI  . Heart attack Father   . Cancer Sister        Brain tumor  . Hypertension Sister   . Osteoporosis Sister   . Colon cancer Brother   . Heart disease Brother   . Diabetes Son 8  . Diabetes Maternal Uncle   . Diabetes Son 37  . Hypertension Sister   . Breast cancer Sister 70  . Hypertension Sister   . Cancer Sister        breast  . Osteoporosis Sister   . Hypertension Sister   . Hypertension Sister   . Stroke Sister   . Hypertension Sister   . Hypertension Sister   . Colon cancer Unknown        nephew  . Brain cancer Son        deceased  . Cancer Son        brain  . Thyroid cancer Other        niece  . Breast cancer Other   . Breast cancer Other   . Breast cancer Other   . Colon polyps Neg Hx   . Esophageal cancer Neg Hx   . Rectal cancer Neg Hx   . Stomach cancer Neg Hx     Social History Social History   Tobacco Use  . Smoking status: Never Smoker  . Smokeless tobacco: Never Used  Substance Use Topics  . Alcohol use: No    Alcohol/week: 0.0 standard drinks  . Drug use: No     Allergies   Alendronate sodium; Amoxicillin; Budesonide; Cephalexin; Prednisone; Sudafed [pseudoephedrine hcl]; Sulfa antibiotics; Toprol xl [metoprolol succinate]; and Trimethoprim   Review of Systems Review of Systems  All systems reviewed and negative, other than as noted in HPI.  Physical Exam Updated Vital Signs BP (!) 170/65   Pulse 68   Temp 97.7 F (36.5 C) (Oral)   Resp 19   Ht 5\' 1"   (1.549 m)   Wt 57.2 kg   SpO2 98%   BMI  23.81 kg/m   Physical Exam  Constitutional: She appears well-developed and well-nourished. No distress.  HENT:  Head: Normocephalic and atraumatic.  Eyes: Conjunctivae are normal. Right eye exhibits no discharge. Left eye exhibits no discharge.  Neck: Neck supple.  Cardiovascular: Normal rate, regular rhythm and normal heart sounds. Exam reveals no gallop and no friction rub.  No murmur heard. Pulmonary/Chest: Effort normal and breath sounds normal. No respiratory distress.  Abdominal: Soft. She exhibits no distension. There is no tenderness.  Musculoskeletal: She exhibits no edema or tenderness.  Lower extremities symmetric as compared to each other. No calf tenderness. Negative Homan's. No palpable cords.   Neurological: She is alert.  Skin: Skin is warm and dry.  Psychiatric: She has a normal mood and affect. Her behavior is normal. Thought content normal.  Nursing note and vitals reviewed.    ED Treatments / Results  Labs (all labs ordered are listed, but only abnormal results are displayed) Labs Reviewed  BASIC METABOLIC PANEL - Abnormal; Notable for the following components:      Result Value   Potassium 3.2 (*)    Calcium 8.7 (*)    All other components within normal limits  CBC  TROPONIN I    EKG EKG Interpretation  Date/Time:  Thursday December 30 2017 16:49:07 EDT Ventricular Rate:  68 PR Interval:    QRS Duration: 87 QT Interval:  406 QTC Calculation: 432 R Axis:   41 Text Interpretation:  Sinus rhythm Atrial premature complexes Low voltage, precordial leads Minimal ST depression, diffuse leads Confirmed by Virgel Manifold [phone]) on 12/30/2017 6:19:15 PM   Radiology No results found.   Dg Chest 2 View  Result Date: 12/30/2017 CLINICAL DATA:  Acute chest pain and shortness of breath. EXAM: CHEST - 2 VIEW COMPARISON:  03/11/2017 prior radiographs FINDINGS: The cardiomediastinal silhouette is unremarkable. There is  no evidence of focal airspace disease, pulmonary edema, suspicious pulmonary nodule/mass, pleural effusion, or pneumothorax. No acute bony abnormalities are identified. IMPRESSION: No active cardiopulmonary disease. Electronically Signed   By: Margarette Canada M.D.   On: 12/30/2017 18:20   Procedures Procedures (including critical care time)  Medications Ordered in ED Medications - No data to display   Initial Impression / Assessment and Plan / ED Course  I have reviewed the triage vital signs and the nursing notes.  Pertinent labs & imaging results that were available during my care of the patient were reviewed by me and considered in my medical decision making (see chart for details).     I have reviewed the triage vital signs and the nursing notes. Prior records were reviewed for additional information.    Pertinent labs & imaging results that were available during my care of the patient were reviewed by me and considered in my medical decision making (see chart for details).  84 year old female with chest pain.  I doubt ACS, PE, dissection or other emergent process.  Final Clinical Impressions(s) / ED Diagnoses   Final diagnoses:  Chest pain, unspecified type    ED Discharge Orders    None       Virgel Manifold, MD 01/05/18 [phone]

## 2018-02-03 ENCOUNTER — Telehealth: Payer: Self-pay | Admitting: Internal Medicine

## 2018-02-03 NOTE — Telephone Encounter (Signed)
Discussed with pt that she can try prep h OTC creams, tucs pads, sitz bahes to get the inflammation down. She may also try recticare that is otc for pain. Pt verbalized understanding.

## 2018-02-07 ENCOUNTER — Ambulatory Visit: Payer: Medicare Other | Admitting: Physician Assistant

## 2018-02-07 ENCOUNTER — Encounter: Payer: Self-pay | Admitting: Physician Assistant

## 2018-02-07 VITALS — BP 154/75 | HR 67 | Temp 97.9°F | Ht 61.0 in | Wt 129.6 lb

## 2018-02-07 DIAGNOSIS — N3001 Acute cystitis with hematuria: Secondary | ICD-10-CM | POA: Diagnosis not present

## 2018-02-07 DIAGNOSIS — R300 Dysuria: Secondary | ICD-10-CM | POA: Diagnosis not present

## 2018-02-07 LAB — URINALYSIS, COMPLETE
BILIRUBIN UA: NEGATIVE
Glucose, UA: NEGATIVE
KETONES UA: NEGATIVE
NITRITE UA: NEGATIVE
PH UA: 5.5 (ref 5.0–7.5)
Protein, UA: NEGATIVE
RBC UA: NEGATIVE
Specific Gravity, UA: <1.005 — ABNORMAL LOW (ref 1.005–1.030)
Urobilinogen, Ur: 0.2 mg/dL (ref 0.2–1.0)

## 2018-02-07 LAB — MICROSCOPIC EXAMINATION
Bacteria, UA: NONE SEEN
RBC MICROSCOPIC, UA: NONE SEEN /HPF (ref 0–2)

## 2018-02-07 MED ORDER — CIPROFLOXACIN HCL 500 MG PO TABS: 500.0000 mg | ORAL_TABLET | Freq: Two times a day (BID) | ORAL | 0 refills | Status: DC

## 2018-02-07 NOTE — Progress Notes (Signed)
BP (!) 154/75   Pulse 67   Temp 97.9 F (36.6 C) (Oral)   Ht 5\' 1"  (1.549 m)   Wt 129 lb 9.6 oz (58.8 kg)   BMI 24.49 kg/m    Subjective:    Patient ID: Rachel Walton, female    DOB: 1929/07/04, 84 y.o.   MRN: [id number]  HPI: Rachel Walton is a 84 y.o. female presenting on 02/07/2018 for Urinary Tract Infection  This patient has had several days of dysuria, frequency and nocturia. There is also pain over the bladder in the suprapubic region, no back pain. Denies leakage or hematuria.  Denies fever or chills. No pain in flank area.   Past Medical History:  Diagnosis Date  . Acute gastric ulcer   . Allergy   . Anxiety   . Atrophic vaginitis   . Barrett's esophagus   . Basal cell carcinoma of nose     under nose  . Cataract   . Depression   . Diverticulosis   . Diverticulosis   . Esophageal stricture   . Esophageal ulcer   . Fatty liver   . GERD (gastroesophageal reflux disease)   . Heart murmur   . Hiatal hernia   . Hx of adenomatous colonic polyps   . Hyperlipidemia   . Hypertension   . Hypokalemia   . IBS (irritable bowel syndrome)   . Kyphosis   . Lymphocytic colitis   . Macular degeneration    "?wet or dry; ? both eyes"  . 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 Osteopenia of the elderly   . Pneumonia X 1  . Postmenopausal   . Recurrent UTI   . 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 Tremor   . Vitamin D deficiency    Relevant past medical, surgical, family and social history reviewed and updated as indicated. Interim medical history since our last visit reviewed. Allergies and medications reviewed and updated. DATA REVIEWED: CHART IN EPIC  Family History reviewed for pertinent findings.  Review of Systems  Constitutional: Negative.   HENT: Negative.   Eyes: Negative.   Respiratory: Negative.   Gastrointestinal: Negative.   Genitourinary: Positive for difficulty urinating, dysuria and urgency. Negative for flank pain.     Allergies as of 02/07/2018      Reactions   Alendronate Sodium Other (See Comments)   Caused chest pain   Amoxicillin    875 mg only . Can take 500 mg when lesser strength   Aspirin Other (See Comments)   Per the patient, made the gastric ulcer worse.   Budesonide Other (See Comments)   Blurry vision, shaking inside her body   Cephalexin    Levofloxacin Other (See Comments)   Insomnia and headache   Prednisone Other (See Comments)   Reaction is unknown..then states nervousness   Sudafed [pseudoephedrine Hcl] Other (See Comments)   insomnia   Sulfa Antibiotics Hives   Toprol Xl [metoprolol Succinate] Other (See Comments)   Unknown reaction   Trimethoprim    Unknown reaction      Medication List        Accurate as of 02/07/18 11:30 AM. Always use your most recent med list.          BENEFIBER Powd Take 1 Dose by mouth daily as needed.   Calcium Carbonate-Vitamin D 600-200 MG-UNIT Tabs Take 1 tablet by mouth daily. Take with a meal   ciprofloxacin 500 MG tablet Commonly known as:  CIPRO Take 1 tablet (500 mg total) by mouth 2 (two) times daily.   clonazePAM 0.5 MG tablet Commonly known as:  KLONOPIN TAKE ONE TABLET TWICE DAILY AS NEEDED   fluticasone 50 MCG/ACT nasal spray Commonly known as:  FLONASE Place 1 spray into both nostrils daily as needed for rhinitis.   mesalamine 1.2 g EC tablet Commonly known as:  LIALDA TAKE 2 TABLETS EVERY MORNING WITH BREAKFAST   potassium chloride 10 MEQ tablet Commonly known as:  K-DUR TAKE ONE (1) TABLET EACH DAY   PRESERVISION AREDS 2 Chew Chew 2 each by mouth daily.   propranolol 10 MG tablet Commonly known as:  INDERAL Take 10 mg by mouth daily.   rosuvastatin 10 MG tablet Commonly known as:  CRESTOR Take 1 tablet (10 mg total) by mouth daily.          Objective:    BP (!) 154/75   Pulse 67   Temp 97.9 F (36.6 C) (Oral)   Ht 5\' 1"  (1.549 m)   Wt 129 lb 9.6 oz (58.8 kg)   BMI 24.49 kg/m    Allergies  Allergen Reactions  . Alendronate Sodium Other (See Comments)    Caused chest pain  . Amoxicillin     875 mg only . Can take 500 mg when lesser strength  . Aspirin Other (See Comments)    Per the patient, made the gastric ulcer worse.  . Budesonide Other (See Comments)    Blurry vision, shaking inside her body  . Cephalexin   . Levofloxacin Other (See Comments)    Insomnia and headache  . Prednisone Other (See Comments)    Reaction is unknown..then states nervousness  . Sudafed [Pseudoephedrine Hcl] Other (See Comments)    insomnia  . Sulfa Antibiotics Hives  . Toprol Xl [Metoprolol Succinate] Other (See Comments)    Unknown reaction  . Trimethoprim     Unknown reaction    Wt Readings from Last 3 Encounters:  02/07/18 129 lb 9.6 oz (58.8 kg)  12/30/17 126 lb (57.2 kg)  12/16/17 129 lb (58.5 kg)    Physical Exam  Constitutional: She is oriented to person, place, and time. She appears well-developed and well-nourished.  HENT:  Head: Normocephalic and atraumatic.  Eyes: Pupils are equal, round, and reactive to light. Conjunctivae are normal.  Cardiovascular: Normal rate, regular rhythm, normal heart sounds and intact distal pulses.  Pulmonary/Chest: Effort normal and breath sounds normal.  Abdominal: Soft. Bowel sounds are normal. She exhibits no distension and no mass. There is tenderness in the suprapubic area. There is no rebound, no guarding and no CVA tenderness.  Neurological: She is alert and oriented to person, place, and time. She has normal reflexes.  Skin: Skin is warm and dry. No rash noted.  Psychiatric: She has a normal mood and affect. Her behavior is normal. Judgment and thought content normal.        Assessment & Plan:   1. Dysuria - Urine Culture - Urinalysis, Complete  2. Acute cystitis with hematuria - ciprofloxacin (CIPRO) 500 MG tablet; Take 1 tablet (500 mg total) by mouth 2 (two) times daily.  Dispense: 20 tablet; Refill:  0   Continue all other maintenance medications as listed above.  Follow up plan: No follow-ups on file.  Educational handout given for [place] PA-C [place] [address]  [place], [place] [address] [phone]   02/07/2018, 11:30 AM

## 2018-02-08 LAB — URINE CULTURE

## 2018-02-22 ENCOUNTER — Other Ambulatory Visit: Payer: Self-pay | Admitting: Family Medicine

## 2018-02-22 DIAGNOSIS — E876 Hypokalemia: Secondary | ICD-10-CM

## 2018-03-04 ENCOUNTER — Other Ambulatory Visit: Payer: Self-pay | Admitting: *Deleted

## 2018-03-04 MED ORDER — MESALAMINE 1.2 G PO TBEC: DELAYED_RELEASE_TABLET | ORAL | 0 refills | Status: DC

## 2018-03-07 ENCOUNTER — Encounter: Payer: Self-pay | Admitting: *Deleted

## 2018-03-11 ENCOUNTER — Ambulatory Visit (INDEPENDENT_AMBULATORY_CARE_PROVIDER_SITE_OTHER): Payer: Medicare Other | Admitting: *Deleted

## 2018-03-11 DIAGNOSIS — Z23 Encounter for immunization: Secondary | ICD-10-CM | POA: Diagnosis not present

## 2018-03-17 ENCOUNTER — Ambulatory Visit: Payer: Medicare Other | Admitting: Internal Medicine

## 2018-03-17 ENCOUNTER — Encounter: Payer: Self-pay | Admitting: Internal Medicine

## 2018-03-17 ENCOUNTER — Encounter

## 2018-03-17 VITALS — BP 154/70 | HR 72 | Ht 61.0 in | Wt 128.0 lb

## 2018-03-17 DIAGNOSIS — K52832 Lymphocytic colitis: Secondary | ICD-10-CM | POA: Diagnosis not present

## 2018-03-17 DIAGNOSIS — K648 Other hemorrhoids: Secondary | ICD-10-CM

## 2018-03-17 MED ORDER — MESALAMINE 1.2 G PO TBEC: 4.8000 g | DELAYED_RELEASE_TABLET | Freq: Every day | ORAL | 1 refills | Status: DC

## 2018-03-17 NOTE — Progress Notes (Signed)
   Subjective:    Patient ID: Rachel Walton, female    DOB: 13-Apr-1930, 84 y.o.   MRN: [id number]  HPI Sarita Hakanson is an 84 year old female with a history of lymphocytic colitis, long-standing IBS, colonic diverticulosis, remote colon polyps, GERD with history of Schatzki's ring, internal hemorrhoids and history of peptic ulcer disease who is here for follow-up.  I saw her in April 2019.  She reports that she has been struggling with lower abdominal discomfort, loose stools which are urgent and more frequent.  Worse after eating.  Internal hemorrhoids have been bothering her both with swelling and very scant bleeding.  She is been using Preparation H.  She is continue Lialda 2.4 g daily.  She has not been taking her Benefiber as before.  No fevers, chills.  Good appetite.  No hepatobiliary complaint.  No nausea or vomiting.   Review of Systems As per HPI, otherwise negative  Current Medications, Allergies, Past Medical History, Past Surgical History, Family History and Social History were reviewed in [employer] record.     Objective:   Physical Exam BP (!) 154/70   Pulse 72   Ht 5\' 1"  (1.549 m)   Wt 128 lb (58.1 kg)   BMI 24.19 kg/m  Constitutional: Well-developed and well-nourished. No distress. HEENT: Normocephalic and atraumatic.  Conjunctivae are normal.  No scleral icterus. Neck: Neck supple. Trachea midline. Cardiovascular: Normal rate, regular rhythm and intact distal pulses. No M/R/G Pulmonary/chest: Effort normal and breath sounds normal. No wheezing, rales or rhonchi. Abdominal: Soft, mildly tender in the lower abdomen without rebound or guarding, nondistended. Bowel sounds active throughout. There are no masses palpable. No hepatosplenomegaly. Rectal: Enlarged grade 3 internal hemorrhoids which are prolapse but reducible, mildly tender, no fluctuance, no other masses Extremities: no clubbing, cyanosis, or edema Neurological: Alert and oriented to  person place and time. Skin: Skin is warm and dry. Psychiatric: Normal mood and affect. Behavior is normal.      Assessment & Plan:  84 year old female with a history of lymphocytic colitis, long-standing IBS, colonic diverticulosis, remote colon polyps, GERD with history of Schatzki's ring, internal hemorrhoids and history of peptic ulcer disease who is here for follow-up.   1.  Lymphocytic colitis --I think that her loose stools, lower abdominal discomfort have exacerbated her hemorrhoids.  This is very likely due to a flare of her lymphocytic colitis.  She had previous good control of this with Lialda 2.4 g daily and Benefiber.  I would like to increase Lialda to 4.8 g daily and have her resume Benefiber.  She has not been using Benefiber as she was previously and I think it will help both with the bulk of her stool and some of her loose stools.  I asked her to call me in 2 weeks and if there is not noticeable improvement in her symptoms we will consider Entocort therapy.  2.  Internal hemorrhoids --exacerbated by #1.  Main therapy is to improve #1.  She can continue over-the-counter Preparation H but add RectiCare for perianal discomfort.  We can consider hemorrhoidal banding once we get #1 in control.  However in the past when her loose stools and diarrhea improve her hemorrhoids often improve.  I would like to see her in a month but also hear back from her in 2 weeks  25 minutes spent with the patient today. Greater than 50% was spent in counseling and coordination of care with the patient

## 2018-03-17 NOTE — Patient Instructions (Addendum)
We have sent the following medications to your pharmacy for you to pick up at your convenience: Lialda-Increase to 4.8 grams daily   Restart your Benefiber.  You may continue to use your hemorrhoidal cream for now. Please call our office at [phone] in 2 weeks to let us know if your symptoms have not gotten significantly better.  Please follow up with Dr Hilarie Fredrickson on 04/18/18 at 330 pm.  If you are age 82 or older, your body mass index should be between 23-30. Your Body mass index is 24.19 kg/m. If this is out of the aforementioned range listed, please consider follow up with your Primary Care Provider.  If you are age 41 or younger, your body mass index should be between 19-25. Your Body mass index is 24.19 kg/m. If this is out of the aformentioned range listed, please consider follow up with your Primary Care Provider.

## 2018-03-24 ENCOUNTER — Telehealth: Payer: Self-pay | Admitting: Internal Medicine

## 2018-03-24 ENCOUNTER — Telehealth: Payer: Self-pay | Admitting: Family Medicine

## 2018-03-24 NOTE — Telephone Encounter (Signed)
Patient aware.

## 2018-03-24 NOTE — Telephone Encounter (Addendum)
Patient states that her bp was really high last night. She said that the top number was 205. Patient states she had lisinopril 20mg  at home and so she took one. She says it came down to 177. Patient wants to know what you recommend. This morning it was 132 on the top. Please advise

## 2018-03-24 NOTE — Telephone Encounter (Signed)
Begin budesonide (ileal release) 9 mg daily x 6 weeks Can return to Lialda 2.4 g daily  This should improve her microscopic colitis She should let us know how this is working after 7-10 days

## 2018-03-24 NOTE — Telephone Encounter (Signed)
FYI for Roselyn Reef- pt is taking for bp lisinopril 20 mg  bp reading from yesterday lowest reading 172-63 highest was 208/73 this morning at 8:00 it was 132/64

## 2018-03-24 NOTE — Telephone Encounter (Signed)
Please check blood pressure readings regularly for the next couple of weeks and take current medication regularly.  Record these readings and if they remain elevated we will add additional medicine or increase some of the current medicine if needed at that time.  Bring readings by for review in a couple of weeks.

## 2018-03-24 NOTE — Telephone Encounter (Signed)
Please check blood pressure readings regularly both morning and night as directed and bring readings by for review in a couple weeks and we will make adjustments as needed at that time.  Continue to watch sodium intake.

## 2018-03-24 NOTE — Telephone Encounter (Signed)
Pt states she has been taking Lialda 4.8gm daily and is still not feeling well. Reports she feels full, her stomach hurts, she is still getting up at night with at least 1 diarrhea stool. States she cannot leave the house because she has urgency and no warning, messes up her clothes. Pt wonders about a different med. Please advise.

## 2018-03-25 NOTE — Telephone Encounter (Signed)
No answer. No voicemail. 

## 2018-03-29 ENCOUNTER — Other Ambulatory Visit: Payer: Self-pay

## 2018-03-29 MED ORDER — BUDESONIDE 3 MG PO CPEP: 9.0000 mg | ORAL_CAPSULE | Freq: Every day | ORAL | 1 refills | Status: DC

## 2018-03-29 NOTE — Telephone Encounter (Signed)
Spoke with pt and she is aware, script sent to pharmacy. 

## 2018-04-05 ENCOUNTER — Telehealth: Payer: Self-pay | Admitting: Family Medicine

## 2018-04-05 NOTE — Telephone Encounter (Signed)
Top number running 160-180 Bottom # running around 65.  168/65 176/61 180/65  appt made for nurse visit tomorrow for [facility] - she is naming meds that are not on med list and our list of meds seems incorrect.

## 2018-04-06 ENCOUNTER — Telehealth: Payer: Self-pay | Admitting: Family Medicine

## 2018-04-06 ENCOUNTER — Ambulatory Visit: Payer: Medicare Other | Admitting: *Deleted

## 2018-04-06 DIAGNOSIS — Z0130 Encounter for examination of blood pressure without abnormal findings: Secondary | ICD-10-CM

## 2018-04-06 MED ORDER — ESCITALOPRAM OXALATE 10 MG PO TABS: 5.0000 mg | ORAL_TABLET | Freq: Every day | ORAL | 1 refills | Status: DC

## 2018-04-06 NOTE — Progress Notes (Signed)
Pt here for BP check BP 195 78 P 64  Rck 194 76 P 68

## 2018-04-06 NOTE — Telephone Encounter (Signed)
Going to pick her RX up

## 2018-04-11 ENCOUNTER — Ambulatory Visit: Payer: Medicare Other | Admitting: Nurse Practitioner

## 2018-04-11 ENCOUNTER — Encounter: Payer: Self-pay | Admitting: Nurse Practitioner

## 2018-04-11 VITALS — BP 197/82 | HR 63 | Temp 96.7°F | Ht 61.0 in | Wt 128.0 lb

## 2018-04-11 DIAGNOSIS — I10 Essential (primary) hypertension: Secondary | ICD-10-CM | POA: Diagnosis not present

## 2018-04-11 MED ORDER — LISINOPRIL 20 MG PO TABS: 20.0000 mg | ORAL_TABLET | Freq: Every day | ORAL | 3 refills | Status: DC

## 2018-04-11 NOTE — Patient Instructions (Signed)
DASH Eating Plan DASH stands for "Dietary Approaches to Stop Hypertension." The DASH eating plan is a healthy eating plan that has been shown to reduce high blood pressure (hypertension). It may also reduce your risk for type 2 diabetes, heart disease, and stroke. The DASH eating plan may also help with weight loss. What are tips for following this plan? General guidelines  Avoid eating more than 2,300 mg (milligrams) of salt (sodium) a day. If you have hypertension, you may need to reduce your sodium intake to 1,500 mg a day.  Limit alcohol intake to no more than 1 drink a day for nonpregnant women and 2 drinks a day for men. One drink equals 12 oz of beer, 5 oz of wine, or 1 oz of hard liquor.  Work with your health care provider to maintain a healthy body weight or to lose weight. Ask what an ideal weight is for you.  Get at least 30 minutes of exercise that causes your heart to beat faster (aerobic exercise) most days of the week. Activities may include walking, swimming, or biking.  Work with your health care provider or diet and nutrition specialist (dietitian) to adjust your eating plan to your individual calorie needs. Reading food labels  Check food labels for the amount of sodium per serving. Choose foods with less than 5 percent of the Daily Value of sodium. Generally, foods with less than 300 mg of sodium per serving fit into this eating plan.  To find whole grains, look for the word "whole" as the first word in the ingredient list. Shopping  Buy products labeled as "low-sodium" or "no salt added."  Buy fresh foods. Avoid canned foods and premade or frozen meals. Cooking  Avoid adding salt when cooking. Use salt-free seasonings or herbs instead of table salt or sea salt. Check with your health care provider or pharmacist before using salt substitutes.  Do not fry foods. Cook foods using healthy methods such as baking, boiling, grilling, and broiling instead.  Cook with  heart-healthy oils, such as olive, canola, soybean, or sunflower oil. Meal planning   Eat a balanced diet that includes: ? 5 or more servings of fruits and vegetables each day. At each meal, try to fill half of your plate with fruits and vegetables. ? Up to 6-8 servings of whole grains each day. ? Less than 6 oz of lean meat, poultry, or fish each day. A 3-oz serving of meat is about the same size as a deck of cards. One egg equals 1 oz. ? 2 servings of low-fat dairy each day. ? A serving of nuts, seeds, or beans 5 times each week. ? Heart-healthy fats. Healthy fats called Omega-3 fatty acids are found in foods such as flaxseeds and coldwater fish, like sardines, salmon, and mackerel.  Limit how much you eat of the following: ? Canned or prepackaged foods. ? Food that is high in trans fat, such as fried foods. ? Food that is high in saturated fat, such as fatty meat. ? Sweets, desserts, sugary drinks, and other foods with added sugar. ? Full-fat dairy products.  Do not salt foods before eating.  Try to eat at least 2 vegetarian meals each week.  Eat more home-cooked food and less restaurant, buffet, and fast food.  When eating at a restaurant, ask that your food be prepared with less salt or no salt, if possible. What foods are recommended? The items listed may not be a complete list. Talk with your dietitian about what   dietary choices are best for you. Grains Whole-grain or whole-wheat bread. Whole-grain or whole-wheat pasta. Brown rice. Oatmeal. Quinoa. Bulgur. Whole-grain and low-sodium cereals. Pita bread. Low-fat, low-sodium crackers. Whole-wheat flour tortillas. Vegetables Fresh or frozen vegetables (raw, steamed, roasted, or grilled). Low-sodium or reduced-sodium tomato and vegetable juice. Low-sodium or reduced-sodium tomato sauce and tomato paste. Low-sodium or reduced-sodium canned vegetables. Fruits All fresh, dried, or frozen fruit. Canned fruit in natural juice (without  added sugar). Meat and other protein foods Skinless chicken or turkey. Ground chicken or turkey. Pork with fat trimmed off. Fish and seafood. Egg whites. Dried beans, peas, or lentils. Unsalted nuts, nut butters, and seeds. Unsalted canned beans. Lean cuts of beef with fat trimmed off. Low-sodium, lean deli meat. Dairy Low-fat (1%) or fat-free (skim) milk. Fat-free, low-fat, or reduced-fat cheeses. Nonfat, low-sodium ricotta or cottage cheese. Low-fat or nonfat yogurt. Low-fat, low-sodium cheese. Fats and oils Soft margarine without trans fats. Vegetable oil. Low-fat, reduced-fat, or light mayonnaise and salad dressings (reduced-sodium). Canola, safflower, olive, soybean, and sunflower oils. Avocado. Seasoning and other foods Herbs. Spices. Seasoning mixes without salt. Unsalted popcorn and pretzels. Fat-free sweets. What foods are not recommended? The items listed may not be a complete list. Talk with your dietitian about what dietary choices are best for you. Grains Baked goods made with fat, such as croissants, muffins, or some breads. Dry pasta or rice meal packs. Vegetables Creamed or fried vegetables. Vegetables in a cheese sauce. Regular canned vegetables (not low-sodium or reduced-sodium). Regular canned tomato sauce and paste (not low-sodium or reduced-sodium). Regular tomato and vegetable juice (not low-sodium or reduced-sodium). Pickles. Olives. Fruits Canned fruit in a light or heavy syrup. Fried fruit. Fruit in cream or butter sauce. Meat and other protein foods Fatty cuts of meat. Ribs. Fried meat. Bacon. Sausage. Bologna and other processed lunch meats. Salami. Fatback. Hotdogs. Bratwurst. Salted nuts and seeds. Canned beans with added salt. Canned or smoked fish. Whole eggs or egg yolks. Chicken or turkey with skin. Dairy Whole or 2% milk, cream, and half-and-half. Whole or full-fat cream cheese. Whole-fat or sweetened yogurt. Full-fat cheese. Nondairy creamers. Whipped toppings.  Processed cheese and cheese spreads. Fats and oils Butter. Stick margarine. Lard. Shortening. Ghee. Bacon fat. Tropical oils, such as coconut, palm kernel, or palm oil. Seasoning and other foods Salted popcorn and pretzels. Onion salt, garlic salt, seasoned salt, table salt, and sea salt. Worcestershire sauce. Tartar sauce. Barbecue sauce. Teriyaki sauce. Soy sauce, including reduced-sodium. Steak sauce. Canned and packaged gravies. Fish sauce. Oyster sauce. Cocktail sauce. Horseradish that you find on the shelf. Ketchup. Mustard. Meat flavorings and tenderizers. Bouillon cubes. Hot sauce and Tabasco sauce. Premade or packaged marinades. Premade or packaged taco seasonings. Relishes. Regular salad dressings. Where to find more information:  National Heart, Lung, and Blood Institute: www.nhlbi.nih.gov  American Heart Association: www.heart.org Summary  The DASH eating plan is a healthy eating plan that has been shown to reduce high blood pressure (hypertension). It may also reduce your risk for type 2 diabetes, heart disease, and stroke.  With the DASH eating plan, you should limit salt (sodium) intake to 2,300 mg a day. If you have hypertension, you may need to reduce your sodium intake to 1,500 mg a day.  When on the DASH eating plan, aim to eat more fresh fruits and vegetables, whole grains, lean proteins, low-fat dairy, and heart-healthy fats.  Work with your health care provider or diet and nutrition specialist (dietitian) to adjust your eating plan to your individual   calorie needs. This information is not intended to replace advice given to you by your health care provider. Make sure you discuss any questions you have with your health care provider. Document Released: 04/16/2011 Document Revised: 04/20/2016 Document Reviewed: 04/20/2016 Elsevier Interactive Patient Education  2018 Elsevier Inc.  

## 2018-04-11 NOTE — Progress Notes (Signed)
   Subjective:    Patient ID: Rachel Walton, female    DOB: 1929-12-26, 84 y.o.   MRN: [id number]   Chief Complaint: BP has been high   HPI Patient in today c/o hypertension. Blood pressure at home has been running around 156-153'P systolic. This has been going on for several weeks. She denies any visual problems, but has had slight headache. She chewed a baby aspirin this morning when she started developing headache. She thought she was on blood pressure meds but what she showed me was lexapro. She was on lisinopril in the past, but said over time blood pressure got to low.   Review of Systems  Constitutional: Negative.   HENT: Negative.   Respiratory: Negative.   Cardiovascular: Negative.   Gastrointestinal: Negative.   Genitourinary: Negative.   Neurological: Positive for headaches (faint).  Psychiatric/Behavioral: Negative.   All other systems reviewed and are negative.      Objective:   Physical Exam  Constitutional: She appears well-developed and well-nourished.  Cardiovascular: Normal rate and regular rhythm.  Pulmonary/Chest: Effort normal.  Abdominal: Soft.  Neurological:  Cerebral tremor   Skin: Skin is warm and dry.  Psychiatric: She has a normal mood and affect. Her behavior is normal. Thought content normal.    BP (!) 197/82   Pulse 63   Temp (!) 96.7 F (35.9 C) (Oral)   Ht 5\' 1"  (1.549 m)   Wt 128 lb (58.1 kg)   BMI 24.19 kg/m        Assessment & Plan:  STEPHANNE GREELEY in today with chief complaint of BP has been high   1. Essential hypertension Go back on lisinopril Continue to keep diary of blood pressure Low sodium diet Keep follow up appointment with Dr. [name]. - lisinopril (PRINIVIL,ZESTRIL) 20 MG tablet; Take 1 tablet (20 mg total) by mouth daily.  Dispense: 90 tablet; Refill: [place], FNP

## 2018-04-12 ENCOUNTER — Telehealth: Payer: Self-pay

## 2018-04-12 NOTE — Telephone Encounter (Signed)
BP last night 210/65   BP today  206/64  Has taken two BP since picking up

## 2018-04-12 NOTE — Telephone Encounter (Signed)
Pt aware of provider feedback and voiced understanding. 

## 2018-04-12 NOTE — Telephone Encounter (Signed)
Ok just continue to watch and call if not improving. May need to do kidney scan

## 2018-04-13 ENCOUNTER — Encounter (HOSPITAL_COMMUNITY): Payer: Self-pay | Admitting: *Deleted

## 2018-04-13 ENCOUNTER — Emergency Department (HOSPITAL_COMMUNITY)
Admission: EM | Admit: 2018-04-13 | Discharge: 2018-04-13 | Disposition: A | Discharge status: Home / Self Care | Payer: Medicare Other | Attending: Emergency Medicine | Admitting: Emergency Medicine

## 2018-04-13 ENCOUNTER — Other Ambulatory Visit: Payer: Self-pay

## 2018-04-13 DIAGNOSIS — Z79899 Other long term (current) drug therapy: Secondary | ICD-10-CM | POA: Diagnosis not present

## 2018-04-13 DIAGNOSIS — I10 Essential (primary) hypertension: Secondary | ICD-10-CM

## 2018-04-13 DIAGNOSIS — E876 Hypokalemia: Secondary | ICD-10-CM | POA: Diagnosis not present

## 2018-04-13 LAB — BASIC METABOLIC PANEL
Anion gap: 7 (ref 5–15)
BUN: 7 mg/dL — ABNORMAL LOW (ref 8–23)
CO2: 30 mmol/L (ref 22–32)
Calcium: 8.2 mg/dL — ABNORMAL LOW (ref 8.9–10.3)
Chloride: 102 mmol/L (ref 98–111)
Creatinine, Ser: 0.67 mg/dL (ref 0.44–1.00)
GFR calc Af Amer: >60 mL/min (ref >60)
GFR calc non Af Amer: >60 mL/min (ref >60)
Glucose, Bld: 111 mg/dL — ABNORMAL HIGH (ref 70–99)
Potassium: 2.7 mmol/L — CL (ref 3.5–5.1)
Sodium: 139 mmol/L (ref 135–145)

## 2018-04-13 LAB — CBC WITH DIFFERENTIAL/PLATELET
Abs Immature Granulocytes: 0.01 10*3/uL (ref 0.00–0.07)
Basophils Absolute: 0.1 10*3/uL (ref 0.0–0.1)
Basophils Relative: 1 %
Eosinophils Absolute: 0.1 10*3/uL (ref 0.0–0.5)
Eosinophils Relative: 2 %
HCT: 38.9 % (ref 36.0–46.0)
Hemoglobin: 12.6 g/dL (ref 12.0–15.0)
Immature Granulocytes: 0 %
Lymphocytes Relative: 27 %
Lymphs Abs: 1.8 10*3/uL (ref 0.7–4.0)
MCH: 28.9 pg (ref 26.0–34.0)
MCHC: 32.4 g/dL (ref 30.0–36.0)
MCV: 89.2 fL (ref 80.0–100.0)
Monocytes Absolute: 0.4 10*3/uL (ref 0.1–1.0)
Monocytes Relative: 7 %
Neutro Abs: 4.3 10*3/uL (ref 1.7–7.7)
Neutrophils Relative %: 63 %
Platelets: 218 10*3/uL (ref 150–400)
RBC: 4.36 MIL/uL (ref 3.87–5.11)
RDW: 13.5 % (ref 11.5–15.5)
WBC: 6.7 10*3/uL (ref 4.0–10.5)
nRBC: 0 % (ref 0.0–0.2)

## 2018-04-13 MED ORDER — POTASSIUM CHLORIDE CRYS ER 20 MEQ PO TBCR
60.0000 meq | EXTENDED_RELEASE_TABLET | Freq: Once | ORAL | Status: AC
  Administered 2018-04-13: 60 meq via ORAL
  Filled 2018-04-13: qty 3

## 2018-04-13 MED ORDER — MAGNESIUM OXIDE 400 (241.3 MG) MG PO TABS
800.0000 mg | ORAL_TABLET | Freq: Once | ORAL | Status: AC
  Administered 2018-04-13: 800 mg via ORAL
  Filled 2018-04-13: qty 2

## 2018-04-13 NOTE — ED Provider Notes (Signed)
[facility] EMERGENCY DEPARTMENT Provider Note   CSN: [id number] Arrival date & time: 04/13/18  1402   History   Chief Complaint Chief Complaint  Patient presents with  . Hypertension    HPI Rachel Walton is a 84 y.o. female.  HPI   84 year old female with hypertension.  History of the same.  She is actually just seen in her PCPs office.  Started back on lisinopril which she been taken previously.  She has taken 3 doses at this point.  She is concerned because her blood pressure was persistently elevated.  She had had a mild headache previously which has since resolved.  She denies any chest pain.  No swelling.  No acute visual changes.  No dyspnea. Past Medical History:  Diagnosis Date  . Acute gastric ulcer   . Allergy   . Anxiety   . Atrophic vaginitis   . Barrett's esophagus   . Basal cell carcinoma of nose     under nose  . Cataract   . Depression   . Diverticulosis   . Esophageal stricture   . Esophageal ulcer   . Fatty liver   . GERD (gastroesophageal reflux disease)   . Heart murmur   . Hiatal hernia   . Hx of adenomatous colonic polyps   . Hyperlipidemia   . Hypertension   . Hypokalemia   . IBS (irritable bowel syndrome)   . Kyphosis   . Lymphocytic colitis   . Macular degeneration    "?wet or dry; ? both eyes"  . 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 Osteopenia of the elderly   . Pneumonia X 1  . Postmenopausal   . Recurrent UTI   . 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 Tremor   . Vitamin D deficiency     Patient Active Problem List   Diagnosis Date Noted  . Osteopenia with high risk of fracture 10/21/2016  . Basal cell carcinoma of nose 02/05/2016  . Lymphocytic colitis 12/20/2015  . Barrett's esophagus 12/20/2015  . 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 Depression 11/07/2015  . Pain in the chest   . Chest pain 10/09/2015  . Hypokalemia 10/09/2015  . Anemia 10/09/2015  . Esophageal stricture 12/11/2013  . Allergic  rhinitis due to pollen 12/11/2013  . Essential hypertension 12/11/2013  . Generalized anxiety disorder 08/21/2013  . Gall stones 06/15/2013  . Hyperlipemia 11/21/2012  . Tremor   . Postmenopausal   . Colon polyp   . Osteopenia of the elderly   . Kyphosis   . Recurrent UTI   . Atrophic vaginitis   . Diverticulosis     Past Surgical History:  Procedure Laterality Date  . ABDOMINAL HYSTERECTOMY  1980  . APPENDECTOMY    . BACK SURGERY    . BREAST BIOPSY Left   . CATARACT EXTRACTION W/ INTRAOCULAR LENS  IMPLANT, BILATERAL Bilateral   . COLONOSCOPY    . ESOPHAGOGASTRODUODENOSCOPY (EGD) WITH ESOPHAGEAL DILATION  X 1  . EYE SURGERY    . LUMBAR DISC SURGERY     "fragmented disc"  . POLYPECTOMY    . SQUAMOUS CELL CARCINOMA EXCISION     nose tip     OB History   None      Home Medications    Prior to Admission medications   Medication Sig Start Date End Date Taking? Authorizing Provider  budesonide (ENTOCORT EC) 3 MG 24 hr capsule Take 3 capsules (9 mg total) by mouth daily. 03/29/18  Pyrtle, Lajuan Lines, MD  Calcium Carbonate-Vitamin D (CALCIUM 600+D) 600-200 MG-UNIT TABS Take 1 tablet by mouth daily. Take with a meal Patient taking differently: Take 1-2 tablets by mouth daily. Take with a meal 12/30/15   Eckard, Lynelle Smoke, PharmD  clonazePAM (KLONOPIN) 0.5 MG tablet TAKE ONE TABLET TWICE DAILY AS NEEDED Patient taking differently: Take 0.25-0.5 mg by mouth 2 (two) times daily.  09/15/17   Chipper Herb, MD  escitalopram (LEXAPRO) 10 MG tablet Take 0.5 tablets (5 mg total) by mouth daily. 04/06/18   Chipper Herb, MD  fluticasone (FLONASE) 50 MCG/ACT nasal spray Place 1 spray into both nostrils daily as needed for rhinitis.  04/10/14   [provider]  lisinopril (PRINIVIL,ZESTRIL) 20 MG tablet Take 1 tablet (20 mg total) by mouth daily. 04/11/18   Hassell Done, Mary-Margaret, FNP  mesalamine (LIALDA) 1.2 g EC tablet Take 4 tablets (4.8 g total) by mouth daily with breakfast. TAKE 2  TABLETS EVERY MORNING WITH BREAKFAST 03/17/18   Pyrtle, Lajuan Lines, MD  Multiple Vitamins-Minerals (PRESERVISION AREDS 2) CHEW Chew 2 each by mouth daily.    [provider]  potassium chloride (K-DUR) 10 MEQ tablet TAKE ONE (1) TABLET EACH DAY 02/23/18   Chipper Herb, MD  propranolol (INDERAL) 10 MG tablet Take 10 mg by mouth daily.    [provider]  rosuvastatin (CRESTOR) 10 MG tablet Take 1 tablet (10 mg total) by mouth daily. Patient taking differently: Take 5 mg by mouth daily.  03/11/17   Chipper Herb, MD  Wheat Dextrin (BENEFIBER) POWD Take 1 Dose by mouth daily as needed.     [provider]    Family History Family History  Problem Relation Age of Onset  . Stroke Mother   . Heart disease Father        MI  . Heart attack Father   . Cancer Sister        Brain tumor  . Hypertension Sister   . Osteoporosis Sister   . Colon cancer Brother   . Heart disease Brother   . Diabetes Son 8  . Diabetes Maternal Uncle   . Diabetes Son 13  . Hypertension Sister   . Breast cancer Sister 79  . Hypertension Sister   . Cancer Sister        breast  . Osteoporosis Sister   . Hypertension Sister   . Hypertension Sister   . Stroke Sister   . Hypertension Sister   . Hypertension Sister   . Colon cancer Unknown        nephew  . Brain cancer Son        deceased  . Cancer Son        brain  . Thyroid cancer Other        niece  . Breast cancer Other   . Breast cancer Other   . Breast cancer Other   . Colon polyps Neg Hx   . Esophageal cancer Neg Hx   . Rectal cancer Neg Hx   . Stomach cancer Neg Hx     Social History Social History   Tobacco Use  . Smoking status: Never Smoker  . Smokeless tobacco: Never Used  Substance Use Topics  . Alcohol use: No    Alcohol/week: 0.0 standard drinks  . Drug use: No     Allergies   Alendronate sodium; Amoxicillin; Aspirin; Budesonide; Cephalexin; Clindamycin/lincomycin; Keflex [cephalexin]; Levofloxacin;  Prednisone; Sudafed [pseudoephedrine hcl]; Sulfa antibiotics; Toprol xl [metoprolol succinate];  and Trimethoprim   Review of Systems Review of Systems  All systems reviewed and negative, other than as noted in HPI.  Physical Exam Updated Vital Signs BP (!) 196/82 (BP Location: Right Arm)   Pulse 65   Temp 97.9 F (36.6 C) (Temporal)   Resp 16   Ht 5\' 1"  (1.549 m)   Wt 57.6 kg   SpO2 95%   BMI 24.00 kg/m   Physical Exam  Constitutional: She appears well-developed and well-nourished. No distress.  HENT:  Head: Normocephalic and atraumatic.  Eyes: Conjunctivae are normal. Right eye exhibits no discharge. Left eye exhibits no discharge.  Neck: Neck supple.  Cardiovascular: Normal rate, regular rhythm and normal heart sounds. Exam reveals no gallop and no friction rub.  No murmur heard. Pulmonary/Chest: Effort normal and breath sounds normal. No respiratory distress.  Abdominal: Soft. She exhibits no distension. There is no tenderness.  Musculoskeletal: She exhibits no edema or tenderness.  Neurological: She is alert.  Skin: Skin is warm and dry.  Psychiatric: She has a normal mood and affect. Her behavior is normal. Thought content normal.  Nursing note and vitals reviewed.    ED Treatments / Results  Labs (all labs ordered are listed, but only abnormal results are displayed) Labs Reviewed  BASIC METABOLIC PANEL - Abnormal; Notable for the following components:      Result Value   Potassium 2.7 (*)    Glucose, Bld 111 (*)    BUN 7 (*)    Calcium 8.2 (*)    All other components within normal limits  CBC WITH DIFFERENTIAL/PLATELET    EKG None  Radiology No results found.  Procedures Procedures (including critical care time)  Medications Ordered in ED Medications - No data to display   Initial Impression / Assessment and Plan / ED Course  I have reviewed the triage vital signs and the nursing notes.  Pertinent labs & imaging results that were available  during my care of the patient were reviewed by me and considered in my medical decision making (see chart for details).    84 year old female with hypertension.  She seems largely asymptomatic.  She was just started on lisinopril.  2 premature at this point to make significant further adjustments or medication.  Try to reassure her.  Explained that there is no emergent indication for rapid blood pressure reduction and that this can actually cause problems.  Noted be hypokalemic.  She was supplemented here in the emergency room.  She is on supplementation chronically.  She was advised to double this for the next week and have repeat blood work.  Final Clinical Impressions(s) / ED Diagnoses   Final diagnoses:  Hypertension, unspecified type  Hypokalemia    ED Discharge Orders    None       Virgel Manifold, MD 04/13/18 2233

## 2018-04-13 NOTE — ED Notes (Signed)
CRITICAL VALUE ALERT  Critical Value:  k 2.7  Date & Time Notied:  04/13/18 1518  Provider Notified: Wilson Singer  Orders Received/Actions taken:

## 2018-04-13 NOTE — ED Triage Notes (Signed)
States her blood pressure is highest it has ever been

## 2018-04-13 NOTE — Discharge Instructions (Signed)
Take two doses of potassium a day for the next week. Follow-up next week to have a level rechecked. Continue to take your lisinopril as prescribed. Your blood pressure is still high, but at this point it is too premature to make additional adjustments.

## 2018-04-18 ENCOUNTER — Encounter: Payer: Self-pay | Admitting: Internal Medicine

## 2018-04-18 ENCOUNTER — Ambulatory Visit: Payer: Medicare Other | Admitting: Internal Medicine

## 2018-04-18 VITALS — BP 148/80 | HR 75 | Ht 61.0 in | Wt 124.4 lb

## 2018-04-18 DIAGNOSIS — K52832 Lymphocytic colitis: Secondary | ICD-10-CM

## 2018-04-18 DIAGNOSIS — I10 Essential (primary) hypertension: Secondary | ICD-10-CM

## 2018-04-18 DIAGNOSIS — F419 Anxiety disorder, unspecified: Secondary | ICD-10-CM

## 2018-04-18 NOTE — Progress Notes (Signed)
Subjective:    Patient ID: Rachel Walton, female    DOB: [birth year], 84 y.o.   MRN: [id number]  HPI Rachel Walton is an 84 year old female with a history of lymphocytic colitis, long-standing IBS, colonic diverticulosis, remote colon polyps, GERD with history of Schatzki's ring, history of peptic ulcer disease who is here for follow-up.  I last saw her on 03/17/2018.  Her last visit she was struggling with lower abdominal discomfort loose stools and diarrhea with urgency.  I increased her Lialda from 2.4 to 4.8 g/day and she called back after a few days with worsening symptoms.  We decided to lower dose of Lialda back to 2.4 g daily and start ileal release budesonide for lymphocytic colitis flare at 9 mg daily.  She has been taking this and reports her diarrhea is significantly improved.  Her lower abdominal pain is also better.  She is now only having 1 maybe 2 bowel movements per day which are mostly formed.  She noticed on her allergy list that she had previous blurry vision and "shaking inside her body" with budesonide in the past.  She has noticed some mild shaky feeling but no change in blurry vision.    She has been dealing with hypertension and was seen both by primary care and in the emergency room.  She is been started on lisinopril 20 mg a day.  She states initially when seen by a nurse at primary care being told that this may be related to her anxiety and she was given prescription for Lexapro at 5 mg.  She is not taking this.  She does use clonazepam when needed for anxiety.  She is also taken over-the-counter Stresstabs for many years which she feels helps.  No blood in her stool or melena.  No fevers or chills.  She will see Dr. [name] on Friday of this week   Review of Systems As per HPI, otherwise negative  Current Medications, Allergies, Past Medical History, Past Surgical History, Family History and Social History were reviewed in [employer] record.     Objective:   Physical Exam BP (!) 148/80   Pulse 75   Ht 5\' 1"  (1.549 m)   Wt 124 lb 6.4 oz (56.4 kg)   SpO2 98%   BMI 23.51 kg/m  Constitutional: Well-developed and well-nourished. No distress. HEENT: Normocephalic and atraumatic.  Conjunctivae are normal.  No scleral icterus. Neck: Neck supple. Trachea midline. Cardiovascular: Normal rate, regular rhythm and intact distal pulses Pulmonary/chest: Effort normal and breath sounds normal. No wheezing, rales or rhonchi. Abdominal: Soft, nontender, nondistended. Bowel sounds active throughout. There are no masses palpable. No hepatosplenomegaly. Extremities: no clubbing, cyanosis, or edema Neurological: Alert and oriented to person place and time. Skin: Skin is warm and dry. Psychiatric: Normal mood and affect. Behavior is normal.      Assessment & Plan:   84 year old female with a history of lymphocytic colitis, long-standing IBS, colonic diverticulosis, remote colon polyps, GERD with history of Schatzki's ring, history of peptic ulcer disease who is here for follow-up.    1.  Lymphocytic colitis flare --her symptoms have considerably improved with initiation of budesonide therapy.  She may be having some minor side effects from this medication and I cannot be 100% sure that her elevated blood pressure does not partially relate to this medicine.  However I did explain to her that this medication has minimal systemic effect given very high first-pass metabolism. --That said I, given considerable improvement in  colitis symptoms/diarrhea, I am going to reduce dose to 6 mg daily x1 week.  Next Monday she can reduce to 3 mg for 4 to 6 weeks. --If diarrhea or lower abdominal pain returns on weaning dose she is asked to notify me.  She voices understanding --Continue Lialda 2.4 g daily  2.  Internal hemorrhoids --improved with improving #1  3.  Hypertension --blood pressure considerably better today compared to previous readings both at  primary care and in the emergency department.  Systolic pressure 750 today on her lisinopril.  She will continue lisinopril 20 mg daily.  To be discussed later this week with Dr. [name]  4.  Anxiety --Lexapro was prescribed though she is not taking it.  It should be noted that this medication can exacerbate lymphocytic colitis and so I would avoid SSRI if possible.  I will see her back in late January, sooner if needed 25 minutes spent with the patient today. Greater than 50% was spent in counseling and coordination of care with the patient

## 2018-04-18 NOTE — Patient Instructions (Addendum)
Decrease Entocort to 6 mg (2 capsules) daily thru Sunday, then decrease to Entocort 3 mg (1 capsule) daily x 6 weeks, then discontinue.  Please follow up with Dr Hilarie Fredrickson on 05/31/18 at 1:45 pm.  If you are age 82 or older, your body mass index should be between 23-30. Your Body mass index is 23.51 kg/m. If this is out of the aforementioned range listed, please consider follow up with your Primary Care Provider.  If you are age 5 or younger, your body mass index should be between 19-25. Your Body mass index is 23.51 kg/m. If this is out of the aformentioned range listed, please consider follow up with your Primary Care Provider.

## 2018-04-22 ENCOUNTER — Ambulatory Visit: Payer: Medicare Other

## 2018-04-22 ENCOUNTER — Ambulatory Visit: Payer: Medicare Other | Admitting: Family Medicine

## 2018-04-22 ENCOUNTER — Encounter: Payer: Self-pay | Admitting: Family Medicine

## 2018-04-22 VITALS — BP 208/85 | HR 70 | Temp 97.8°F | Ht 61.0 in | Wt 126.0 lb

## 2018-04-22 DIAGNOSIS — E559 Vitamin D deficiency, unspecified: Secondary | ICD-10-CM

## 2018-04-22 DIAGNOSIS — Z13820 Encounter for screening for osteoporosis: Secondary | ICD-10-CM

## 2018-04-22 DIAGNOSIS — F418 Other specified anxiety disorders: Secondary | ICD-10-CM

## 2018-04-22 DIAGNOSIS — E7800 Pure hypercholesterolemia, unspecified: Secondary | ICD-10-CM | POA: Diagnosis not present

## 2018-04-22 DIAGNOSIS — E876 Hypokalemia: Secondary | ICD-10-CM

## 2018-04-22 DIAGNOSIS — Z780 Asymptomatic menopausal state: Secondary | ICD-10-CM

## 2018-04-22 DIAGNOSIS — I10 Essential (primary) hypertension: Secondary | ICD-10-CM

## 2018-04-22 DIAGNOSIS — R51 Headache: Secondary | ICD-10-CM

## 2018-04-22 DIAGNOSIS — R519 Headache, unspecified: Secondary | ICD-10-CM

## 2018-04-22 MED ORDER — AMLODIPINE BESYLATE 2.5 MG PO TABS: 2.5000 mg | ORAL_TABLET | Freq: Every day | ORAL | 3 refills | Status: DC

## 2018-04-22 NOTE — Addendum Note (Signed)
Addended by: Zannie Cove on: 04/22/2018 03:26 PM   Modules accepted: Orders

## 2018-04-22 NOTE — Progress Notes (Signed)
Subjective:    Patient ID: Rachel Walton, female    DOB: 1930/02/28, 84 y.o.   MRN: [id number]  HPI  Pt here for follow up and management of chronic medical problems which includes hypertension. She is taking medication regularly.  Just saw Dr. Hilarie Fredrickson for her lymphocytic colitis and he did make a comment in the note that some of the medicine he had been using could cause her blood pressure to run higher and he has reduced this medicine.  He also emphasized that Lexapro could aggravate her lymphocytic colitis.  And that she should stay away from SSRIs.  The patient says her blood pressure has been elevated and she just does not want to do anything.  The 2 readings done in the office today were both elevated.  I will check another reading when I go in the room.  She is currently taking lisinopril 20 mg and she is also on potassium.  On December 4 she did go to the emergency room because of her hypertension.  On 4 December she did have a potassium checked and it was very low at 2.7.  Since that time she has been taking potassium.  Her creatinine was good then.  The patient has a history of anxiety skin cancer GERD depression hyperlipidemia and hypertension.  The patient's blood pressure was elevated on 3 occasions today.  She thinks stress may be playing a role.  She denies any chest pain but has continued to have some shortness of breath with exertion.  She denies any trouble currently with her stomach and says it is doing better with what Dr. Elmo Putt is doing.  She is passing her water well.  She is off of the Lexapro.  She is reducing her Entocort.   Patient Active Problem List   Diagnosis Date Noted  . Osteopenia with high risk of fracture 10/21/2016  . Basal cell carcinoma of nose 02/05/2016  . Lymphocytic colitis 12/20/2015  . Barrett's esophagus 12/20/2015  . 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 Depression 11/07/2015  . Pain in the chest   . Chest pain 10/09/2015  . Hypokalemia 10/09/2015  .  Anemia 10/09/2015  . Esophageal stricture 12/11/2013  . Allergic rhinitis due to pollen 12/11/2013  . Essential hypertension 12/11/2013  . Generalized anxiety disorder 08/21/2013  . Gall stones 06/15/2013  . Hyperlipemia 11/21/2012  . Tremor   . Postmenopausal   . Colon polyp   . Osteopenia of the elderly   . Kyphosis   . Recurrent UTI   . Atrophic vaginitis   . Diverticulosis    Outpatient Encounter Medications as of 04/22/2018  Medication Sig  . budesonide (ENTOCORT EC) 3 MG 24 hr capsule Take 3 capsules (9 mg total) by mouth daily.  . Calcium Carbonate-Vitamin D (CALCIUM 600+D) 600-200 MG-UNIT TABS Take 1 tablet by mouth daily. Take with a meal (Patient taking differently: Take 1-2 tablets by mouth daily. Take with a meal)  . Cholecalciferol (VITAMIN D) 50 MCG (2000 UT) tablet Take 2,000 Units by mouth daily.  . clonazePAM (KLONOPIN) 0.5 MG tablet TAKE ONE TABLET TWICE DAILY AS NEEDED (Patient taking differently: Take 0.25-0.5 mg by mouth 2 (two) times daily. )  . fexofenadine (ALLEGRA) 180 MG tablet Take 180 mg by mouth daily.  . fluticasone (FLONASE) 50 MCG/ACT nasal spray Place 1 spray into both nostrils daily as needed for rhinitis.   Marland Kitchen lisinopril (PRINIVIL,ZESTRIL) 20 MG tablet Take 1 tablet (20 mg total) by mouth daily.  . Magnesium (ESSENTIAL MAGNESIUM)  250 MG TABS Take 1 tablet by mouth daily.  . mesalamine (LIALDA) 1.2 g EC tablet Take 4 tablets (4.8 g total) by mouth daily with breakfast. TAKE 2 TABLETS EVERY MORNING WITH BREAKFAST (Patient taking differently: Take 2.4 g by mouth daily with breakfast. TAKE 2 TABLETS EVERY MORNING WITH BREAKFAST)  . Multiple Vitamins-Minerals (PRESERVISION AREDS 2) CHEW Chew 2 each by mouth daily.  Marland Kitchen OVER THE COUNTER MEDICATION Stress tab energy- one a day  . potassium chloride (K-DUR) 10 MEQ tablet TAKE ONE (1) TABLET EACH DAY  . rosuvastatin (CRESTOR) 10 MG tablet Take 1 tablet (10 mg total) by mouth daily. (Patient taking differently:  Take 5 mg by mouth daily. Takes every other day)  . sodium chloride (OCEAN) 0.65 % SOLN nasal spray Place 1 spray into both nostrils as needed for congestion.  . Wheat Dextrin (BENEFIBER) POWD Take 1 Dose by mouth daily as needed.   . [DISCONTINUED] escitalopram (LEXAPRO) 10 MG tablet Take 0.5 tablets (5 mg total) by mouth daily.   No facility-administered encounter medications on file as of 04/22/2018.       Review of Systems  Constitutional: Positive for fatigue ("no WANT to do anything").  HENT: Negative.   Eyes: Negative.   Respiratory: Negative.   Cardiovascular: Negative.        Elevated BP   Gastrointestinal: Negative.   Endocrine: Negative.   Genitourinary: Negative.   Musculoskeletal: Negative.   Skin: Negative.   Allergic/Immunologic: Negative.   Neurological: Negative.   Hematological: Negative.   Psychiatric/Behavioral: Negative.        Objective:   Physical Exam Vitals signs and nursing note reviewed.  Constitutional:      Appearance: Normal appearance. She is well-developed and normal weight.  HENT:     Head: Normocephalic and atraumatic.     Right Ear: External ear normal.     Left Ear: External ear normal.     Nose: Nose normal. No congestion.     Mouth/Throat:     Mouth: Mucous membranes are moist.     Pharynx: Oropharynx is clear. No oropharyngeal exudate.  Eyes:     General: No scleral icterus.       Right eye: No discharge.        Left eye: No discharge.     Conjunctiva/sclera: Conjunctivae normal.     Pupils: Pupils are equal, round, and reactive to light.  Neck:     Musculoskeletal: Normal range of motion and neck supple.     Thyroid: No thyromegaly.     Vascular: No carotid bruit or JVD.  Cardiovascular:     Rate and Rhythm: Normal rate and regular rhythm.     Heart sounds: Normal heart sounds. No murmur.     Comments: The heart was only slightly irregular at 72/min Pulmonary:     Effort: Pulmonary effort is normal.     Breath sounds:  Normal breath sounds. No wheezing or rales.     Comments: Clear anteriorly and posteriorly Abdominal:     General: Bowel sounds are normal.     Palpations: Abdomen is soft. There is no mass.     Tenderness: There is no abdominal tenderness.  Musculoskeletal: Normal range of motion.        General: No swelling or tenderness.     Right lower leg: No edema.     Left lower leg: No edema.  Lymphadenopathy:     Cervical: No cervical adenopathy.  Skin:    General: Skin is  warm and dry.     Findings: No rash.  Neurological:     General: No focal deficit present.     Mental Status: She is alert and oriented to person, place, and time.     Deep Tendon Reflexes: Reflexes are normal and symmetric.  Psychiatric:        Mood and Affect: Mood normal.        Behavior: Behavior normal.        Thought Content: Thought content normal.        Judgment: Judgment normal.     Comments: Mood affect and behavior are normal for patient.     BP (!) 200/79 (BP Location: Left Arm)   Pulse 70   Temp 97.8 F (36.6 C) (Oral)   Ht 5' 1"  (1.549 m)   Wt 126 lb (57.2 kg)   BMI 23.81 kg/m        Assessment & Plan:  1. Essential hypertension -Pressure was elevated on 3 occasions.  When I checked it it was 180/82. -She is currently taking lisinopril 20. -We will add amlodipine 2-1/2 mg once daily to her current treatment - BMP8+EGFR - CBC with Differential/Platelet - Hepatic function panel  2. Pure hypercholesterolemia -Continue with therapeutic lifestyle changes and Crestor until lab work is returned - CBC with Differential/Platelet - Lipid panel  3. Vitamin D deficiency -Continue with vitamin D replacement - DG [facility] DEXA; Future - CBC with Differential/Platelet - VITAMIN D 25 Hydroxy (Vit-D Deficiency, Fractures)  4. Screening for osteoporosis - DG [facility] DEXA; Future  5. Postmenopausal - DG [facility] DEXA; Future  6. Headache around the eyes -Add amlodipine 2-1/2 mg to lisinopril 20  mg -Check blood pressure in 1 week and look and review readings from home at that time  7. Anxiety about health -Take clonazepam at least 1 daily and take one half to 1 in the evening on a more regular basis for the next few days  8.  Hypokalemia -Continue potassium 10 mEq 1 twice daily or 2 once daily and check BMP today  No orders of the defined types were placed in this encounter.  Patient Instructions                       Medicare Annual Wellness Visit  [facility] and the medical providers at [facility] strive to bring you the best medical care.  In doing so we not only want to address your current medical conditions and concerns but also to detect new conditions early and prevent illness, disease and health-related problems.    Medicare offers a yearly Wellness Visit which allows our clinical staff to assess your need for preventative services including immunizations, lifestyle education, counseling to decrease risk of preventable diseases and screening for fall risk and other medical concerns.    This visit is provided free of charge (no copay) for all Medicare recipients. The clinical pharmacists at [facility] have begun to conduct these Wellness Visits which will also include a thorough review of all your medications.    As you primary medical provider recommend that you make an appointment for your Annual Wellness Visit if you have not done so already this year.  You may set up this appointment before you leave today or you may call back ([phone]) and schedule an appointment.  Please make sure when you call that you mention that you are scheduling your Annual Wellness Visit with the clinical pharmacist so  that the appointment may be made for the proper length of time.     Continue current medications. Continue good therapeutic lifestyle changes which include good diet and exercise. Fall precautions discussed with patient. If an  FOBT was given today- please return it to our front desk. If you are over 54 years old - you may need Prevnar 43 or the adult Pneumonia vaccine.  **Flu shots are available--- please call and schedule a FLU-CLINIC appointment**  After your visit with [place] today you will receive a survey in the mail or online from [employer] regarding your care with [place]. Please take a moment to fill this out. Your feedback is very important to [place] as you can help [place] better understand your patient needs as well as improve your experience and satisfaction. WE CARE ABOUT YOU!!!  Reduce Entocort as directed by Dr. Hilarie Fredrickson Take clonazepam 1 in the morning and one half to 1 in the evening for the next few days to help you calm down Add amlodipine 2-1/2 mg to the current treatment regimen of lisinopril 20 to see if we can get better blood pressure control Patient should come by in about 1 week to have blood pressure rechecked She should continue to check some readings at home and watch her sodium intake We will get a BMP to check her potassium today and she should continue to take the potassium 2 daily or 1 twice daily but get a total of 20 mEq and daily. She needs to go to the pharmacy and get all of her medicines prepackaged so that she can keep up with them better.   Arrie Senate MD

## 2018-04-22 NOTE — Patient Instructions (Addendum)
Medicare Annual Wellness Visit  [facility] and the medical providers at [facility] strive to bring you the best medical care.  In doing so we not only want to address your current medical conditions and concerns but also to detect new conditions early and prevent illness, disease and health-related problems.    Medicare offers a yearly Wellness Visit which allows our clinical staff to assess your need for preventative services including immunizations, lifestyle education, counseling to decrease risk of preventable diseases and screening for fall risk and other medical concerns.    This visit is provided free of charge (no copay) for all Medicare recipients. The clinical pharmacists at [facility] have begun to conduct these Wellness Visits which will also include a thorough review of all your medications.    As you primary medical provider recommend that you make an appointment for your Annual Wellness Visit if you have not done so already this year.  You may set up this appointment before you leave today or you may call back ([phone]) and schedule an appointment.  Please make sure when you call that you mention that you are scheduling your Annual Wellness Visit with the clinical pharmacist so that the appointment may be made for the proper length of time.     Continue current medications. Continue good therapeutic lifestyle changes which include good diet and exercise. Fall precautions discussed with patient. If an FOBT was given today- please return it to our front desk. If you are over 43 years old - you may need Prevnar 69 or the adult Pneumonia vaccine.  **Flu shots are available--- please call and schedule a FLU-CLINIC appointment**  After your visit with [place] today you will receive a survey in the mail or online from [employer] regarding your care with [place]. Please take a moment to fill this out. Your feedback is very  important to [place] as you can help [place] better understand your patient needs as well as improve your experience and satisfaction. WE CARE ABOUT YOU!!!  Reduce Entocort as directed by Dr. Hilarie Fredrickson Take clonazepam 1 in the morning and one half to 1 in the evening for the next few days to help you calm down Add amlodipine 2-1/2 mg to the current treatment regimen of lisinopril 20 to see if we can get better blood pressure control Patient should come by in about 1 week to have blood pressure rechecked She should continue to check some readings at home and watch her sodium intake We will get a BMP to check her potassium today and she should continue to take the potassium 2 daily or 1 twice daily but get a total of 20 mEq and daily. She needs to go to the pharmacy and get all of her medicines prepackaged so that she can keep up with them better.

## 2018-04-23 LAB — LIPID PANEL
CHOL/HDL RATIO: 2 ratio (ref 0.0–4.4)
CHOLESTEROL TOTAL: 241 mg/dL — AB (ref 100–199)
HDL: 121 mg/dL (ref >39)
LDL CALC: 98 mg/dL (ref 0–99)
Triglycerides: 109 mg/dL (ref 0–149)
VLDL CHOLESTEROL CAL: 22 mg/dL (ref 5–40)

## 2018-04-23 LAB — CBC WITH DIFFERENTIAL/PLATELET
BASOS ABS: 0.1 10*3/uL (ref 0.0–0.2)
BASOS: 1 %
EOS (ABSOLUTE): 0.1 10*3/uL (ref 0.0–0.4)
EOS: 2 %
HEMATOCRIT: 41.8 % (ref 34.0–46.6)
Hemoglobin: 13.9 g/dL (ref 11.1–15.9)
IMMATURE GRANS (ABS): 0 10*3/uL (ref 0.0–0.1)
Immature Granulocytes: 0 %
LYMPHS ABS: 2.9 10*3/uL (ref 0.7–3.1)
LYMPHS: 43 %
MCH: 29.3 pg (ref 26.6–33.0)
MCHC: 33.3 g/dL (ref 31.5–35.7)
MCV: 88 fL (ref 79–97)
MONOCYTES: 7 %
Monocytes Absolute: 0.5 10*3/uL (ref 0.1–0.9)
NEUTROS ABS: 3.3 10*3/uL (ref 1.4–7.0)
Neutrophils: 47 %
Platelets: 266 10*3/uL (ref 150–450)
RBC: 4.74 x10E6/uL (ref 3.77–5.28)
RDW: 13.2 % (ref 12.3–15.4)
WBC: 6.9 10*3/uL (ref 3.4–10.8)

## 2018-04-23 LAB — BMP8+EGFR
BUN / CREAT RATIO: 7 — AB (ref 12–28)
BUN: 6 mg/dL — AB (ref 8–27)
CALCIUM: 9.3 mg/dL (ref 8.7–10.3)
CO2: 22 mmol/L (ref 20–29)
CREATININE: 0.81 mg/dL (ref 0.57–1.00)
Chloride: 104 mmol/L (ref 96–106)
GFR, EST AFRICAN AMERICAN: 76 mL/min/{1.73_m2} (ref >59)
GFR, EST NON AFRICAN AMERICAN: 66 mL/min/{1.73_m2} (ref >59)
Glucose: 96 mg/dL (ref 65–99)
Potassium: 4.3 mmol/L (ref 3.5–5.2)
Sodium: 141 mmol/L (ref 134–144)

## 2018-04-23 LAB — HEPATIC FUNCTION PANEL
ALBUMIN: 4.3 g/dL (ref 3.5–4.7)
ALK PHOS: 54 IU/L (ref 39–117)
ALT: 23 IU/L (ref 0–32)
AST: 23 IU/L (ref 0–40)
Bilirubin Total: 0.4 mg/dL (ref 0.0–1.2)
Bilirubin, Direct: 0.12 mg/dL (ref 0.00–0.40)
TOTAL PROTEIN: 6.6 g/dL (ref 6.0–8.5)

## 2018-04-23 LAB — VITAMIN D 25 HYDROXY (VIT D DEFICIENCY, FRACTURES): Vit D, 25-Hydroxy: 30.6 ng/mL (ref 30.0–100.0)

## 2018-04-27 ENCOUNTER — Telehealth: Payer: Self-pay | Admitting: Family Medicine

## 2018-04-27 ENCOUNTER — Ambulatory Visit: Payer: Medicare Other | Admitting: Family Medicine

## 2018-04-27 ENCOUNTER — Encounter: Payer: Self-pay | Admitting: Family Medicine

## 2018-04-27 VITALS — BP 152/75 | HR 86 | Temp 96.9°F | Ht 61.0 in | Wt 126.8 lb

## 2018-04-27 DIAGNOSIS — R350 Frequency of micturition: Secondary | ICD-10-CM

## 2018-04-27 DIAGNOSIS — R410 Disorientation, unspecified: Secondary | ICD-10-CM | POA: Diagnosis not present

## 2018-04-27 DIAGNOSIS — R531 Weakness: Secondary | ICD-10-CM

## 2018-04-27 LAB — URINALYSIS
Bilirubin, UA: NEGATIVE
Glucose, UA: NEGATIVE
Ketones, UA: NEGATIVE
NITRITE UA: NEGATIVE
PH UA: 5.5 (ref 5.0–7.5)
PROTEIN UA: NEGATIVE
Specific Gravity, UA: <1.005 — ABNORMAL LOW (ref 1.005–1.030)
UUROB: 0.2 mg/dL (ref 0.2–1.0)

## 2018-04-27 MED ORDER — MESALAMINE 1.2 G PO TBEC: 2.4000 g | DELAYED_RELEASE_TABLET | Freq: Every day | ORAL | 0 refills | Status: DC

## 2018-04-27 MED ORDER — AMOXICILLIN 500 MG PO CAPS: 500.0000 mg | ORAL_CAPSULE | Freq: Two times a day (BID) | ORAL | 0 refills | Status: DC

## 2018-04-27 NOTE — Progress Notes (Signed)
Subjective:  Patient ID: Rachel Walton, female    DOB: 03/08/1930  Age: 84 y.o. MRN: [id number]  CC: Fatigue (Patient states she thinks she is taking too much medicine. States that she does not feel like doing anything.  Today patient states she has a headache. )   HPI Rachel Walton presents for concern for loss of energy.  She just does not feel like doing anything.  She is very concerned that her blood pressures been high and that the medicines she is taking for that and for her colitis may be contributing.  She denies any acute illness symptoms such as fever chills sweats cough sore throat, chest pain, abdominal pain.  She does say that over the last several days she has had frequency of urination but she is drinking more water.  Of note is that her medication for blood pressure was increased by adding amlodipine just 5 days ago.  However, her symptoms predate that.  She is unclear whether there has been any increase even since 5 days ago.  However after discussion it seems not.   Additionally there were some changes made in her colitis medicine by Dr. Hilarie Fredrickson that she does not quite understand at first she says it was increased and she says it was decreased.  As best I can tell from the chart her Entocort was actually decreased.  She supposed to be taking 1 a day for the next 6 weeks.  However she feels that she is taking too much medicine and would like to cut back since she is has not been having any abdominal pain or diarrhea.    She is concerned about lisinopril as well but realizes that her pressure remains too high to cut back on that.  Additionally she tells me that the Crestor is something she is suspicious of causing problems for her and she would like to stop that.  Extensive review of her medications was performed based on a great deal of contradiction in her statements about her medicines.  Depression screen [facility] 2/9 04/22/2018 04/11/2018 12/16/2017  Decreased Interest 0 0 0  Down,  Depressed, Hopeless 1 0 1  PHQ - 2 Score 1 0 1  Altered sleeping - - -  Tired, decreased energy - - -  Change in appetite - - -  Feeling bad or failure about yourself  - - -  Trouble concentrating - - -  Moving slowly or fidgety/restless - - -  Suicidal thoughts - - -  PHQ-9 Score - - -  Some recent data might be hidden    History Rachel Walton has a past medical history of Acute gastric ulcer, Allergy, Anxiety, Atrophic vaginitis, Barrett's esophagus, Basal cell carcinoma of nose, Cataract, Depression, Diverticulosis, Esophageal stricture, Esophageal ulcer, Fatty liver, GERD (gastroesophageal reflux disease), Heart murmur, Hiatal hernia, adenomatous colonic polyps, Hyperlipidemia, Hypertension, Hypokalemia, IBS (irritable bowel syndrome), Kyphosis, Lymphocytic colitis, Macular degeneration, Meningoencephalitis (2005), Osteopenia of the elderly, Pneumonia (X 1), Postmenopausal, Recurrent UTI, 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   She has a past surgical history that includes Back surgery; Appendectomy; Cataract extraction w/ intraocular lens  implant, bilateral (Bilateral); Eye surgery; Colonoscopy; Polypectomy; Lumbar disc surgery; Squamous cell carcinoma excision; Abdominal hysterectomy (1980); Esophagogastroduodenoscopy (egd) with esophageal dilation (X 1); and Breast biopsy (Left).   Her family history includes Brain cancer in her son; Breast cancer in some other family members; Breast cancer (age of onset: 72) in her sister; Cancer in her  sister, sister, and son; Colon cancer in her brother and another family member; Diabetes in her maternal uncle; Diabetes (age of onset: 32) in her son; Diabetes (age of onset: 25) in her son; Heart attack in her father; Heart disease in her brother and father; Hypertension in her sister, sister, sister, sister, sister, sister, and sister; Osteoporosis in her sister and sister; Stroke in her mother and sister; Thyroid cancer in an  other family member.She reports that she has never smoked. She has never used smokeless tobacco. She reports that she does not drink alcohol or use drugs.    ROS Review of Systems  Constitutional: Positive for fatigue.  HENT: Negative for congestion.   Eyes: Negative for visual disturbance.  Respiratory: Negative for shortness of breath.   Cardiovascular: Negative for chest pain.  Gastrointestinal: Positive for diarrhea (Diarrhea has dissipated since undergoing treatment for colitis). Negative for abdominal pain, constipation, nausea and vomiting.  Genitourinary: Positive for frequency. Negative for difficulty urinating, dysuria and urgency.  Musculoskeletal: Positive for arthralgias and myalgias.  Neurological: Positive for weakness (Nonfocal) and headaches.  Psychiatric/Behavioral: Negative for sleep disturbance.    Objective:  BP (!) 152/75   Pulse 86   Temp (!) 96.9 F (36.1 C) (Oral)   Ht 5' 1"  (1.549 m)   Wt 126 lb 12.8 oz (57.5 kg)   BMI 23.96 kg/m   BP Readings from Last 3 Encounters:  04/27/18 (!) 152/75  04/22/18 (!) 208/85  04/18/18 (!) 148/80    Wt Readings from Last 3 Encounters:  04/27/18 126 lb 12.8 oz (57.5 kg)  04/22/18 126 lb (57.2 kg)  04/18/18 124 lb 6.4 oz (56.4 kg)     Physical Exam Constitutional:      General: She is not in acute distress.    Appearance: She is well-developed.  HENT:     Head: Normocephalic and atraumatic.  Eyes:     Conjunctiva/sclera: Conjunctivae normal.     Pupils: Pupils are equal, round, and reactive to light.  Neck:     Musculoskeletal: Normal range of motion.  Cardiovascular:     Rate and Rhythm: Normal rate and regular rhythm.     Heart sounds: Normal heart sounds. No murmur.  Pulmonary:     Effort: Pulmonary effort is normal. No respiratory distress.     Breath sounds: Normal breath sounds. No wheezing or rales.  Abdominal:     Palpations: Abdomen is soft.     Tenderness: There is no abdominal tenderness.    Musculoskeletal: Normal range of motion.  Skin:    General: Skin is warm and dry.  Neurological:     Mental Status: She is alert and oriented to person, place, and time.     Coordination: Coordination abnormal (Patient has a rest tremor of the head.  This is not new.  Remote complete work-up performed.  Not temporally related to any of her current symptoms.).  Psychiatric:        Behavior: Behavior normal.       Assessment & Plan:   Rachel Walton was seen today for fatigue.  Diagnoses and all orders for this visit:  Mental confusion -     CBC with Differential/Platelet -     CMP14+EGFR -     Magnesium -     Phosphorus -     Vitamin B12 -     Folate  Frequency of urination -     Urinalysis -     Urine Culture  [place] -  Urinalysis -     Urine Culture -     CBC with Differential/Platelet -     CMP14+EGFR -     Magnesium -     Phosphorus -     Vitamin B12 -     Folate  Other orders -     mesalamine (LIALDA) 1.2 g EC tablet; Take 2 tablets (2.4 g total) by mouth daily with breakfast. TAKE 2 TABLETS EVERY MORNING WITH BREAKFAST -     amoxicillin (AMOXIL) 500 MG capsule; Take 1 capsule (500 mg total) by mouth 2 (two) times daily.       I have discontinued Inda Merlin. [place]'s rosuvastatin and budesonide. I have also changed her mesalamine. Additionally, I am having her start on amoxicillin. Lastly, I am having her maintain her fluticasone, Calcium Carbonate-Vitamin D, BENEFIBER, clonazePAM, PRESERVISION AREDS 2, potassium chloride, lisinopril, OVER THE COUNTER MEDICATION, Magnesium, fexofenadine, Vitamin D, sodium chloride, and amLODipine.  Allergies as of 04/27/2018      Reactions   Alendronate Sodium Other (See Comments)   Caused chest pain   Lexapro [escitalopram Oxalate] Other (See Comments)   Makes lymphangitic colitis worse   Amoxicillin    875 mg only . Can take 500 mg when lesser strength   Aspirin Other (See Comments)   Per the patient, made the gastric  ulcer worse.   Budesonide Other (See Comments)   Blurry vision, shaking inside her body   Cephalexin    Clindamycin/lincomycin    Keflex [cephalexin]    Levofloxacin Other (See Comments)   Insomnia and headache   Prednisone Other (See Comments)   Reaction is unknown..then states nervousness   Sudafed [pseudoephedrine Hcl] Other (See Comments)   insomnia   Sulfa Antibiotics Hives   Toprol Xl [metoprolol Succinate] Other (See Comments)   Unknown reaction   Trimethoprim    Unknown reaction      Medication List       Accurate as of April 27, 2018  7:03 PM. Always use your most recent med list.        amLODipine 2.5 MG tablet Commonly known as:  NORVASC Take 1 tablet (2.5 mg total) by mouth daily.   amoxicillin 500 MG capsule Commonly known as:  AMOXIL Take 1 capsule (500 mg total) by mouth 2 (two) times daily.   BENEFIBER Powd Take 1 Dose by mouth daily as needed.   Calcium Carbonate-Vitamin D 600-200 MG-UNIT Tabs Commonly known as:  CALCIUM 600+D Take 1 tablet by mouth daily. Take with a meal   clonazePAM 0.5 MG tablet Commonly known as:  KLONOPIN TAKE ONE TABLET TWICE DAILY AS NEEDED   ESSENTIAL MAGNESIUM 250 MG Tabs Generic drug:  Magnesium Take 1 tablet by mouth daily.   fexofenadine 180 MG tablet Commonly known as:  ALLEGRA Take 180 mg by mouth daily.   fluticasone 50 MCG/ACT nasal spray Commonly known as:  FLONASE Place 1 spray into both nostrils daily as needed for rhinitis.   lisinopril 20 MG tablet Commonly known as:  PRINIVIL,ZESTRIL Take 1 tablet (20 mg total) by mouth daily.   mesalamine 1.2 g EC tablet Commonly known as:  LIALDA Take 2 tablets (2.4 g total) by mouth daily with breakfast. TAKE 2 TABLETS EVERY MORNING WITH BREAKFAST   OVER THE COUNTER MEDICATION Stress tab energy- one a day   potassium chloride 10 MEQ tablet Commonly known as:  K-DUR TAKE ONE (1) TABLET EACH DAY   PRESERVISION AREDS 2 Chew Chew 2 each by mouth  daily.   sodium chloride 0.65 % Soln nasal spray Commonly known as:  OCEAN Place 1 spray into both nostrils as needed for congestion.   Vitamin D 50 MCG (2000 UT) tablet Take 2,000 Units by mouth daily.        Follow-up: Return in about 2 weeks (around 05/11/2018).  Claretta Fraise, M.D.

## 2018-04-27 NOTE — Telephone Encounter (Signed)
Pt scheduled with Dr Livia Snellen at 5:30 today.

## 2018-04-29 ENCOUNTER — Ambulatory Visit: Payer: Medicare Other | Admitting: *Deleted

## 2018-04-29 VITALS — BP 148/60 | HR 78

## 2018-04-29 DIAGNOSIS — Z0130 Encounter for examination of blood pressure without abnormal findings: Secondary | ICD-10-CM

## 2018-04-29 LAB — CMP14+EGFR
A/G RATIO: 1.8 (ref 1.2–2.2)
ALK PHOS: 64 IU/L (ref 39–117)
ALT: 25 IU/L (ref 0–32)
AST: 21 IU/L (ref 0–40)
Albumin: 4.4 g/dL (ref 3.5–4.7)
BILIRUBIN TOTAL: 0.4 mg/dL (ref 0.0–1.2)
BUN/Creatinine Ratio: 9 — ABNORMAL LOW (ref 12–28)
BUN: 7 mg/dL — AB (ref 8–27)
CHLORIDE: 94 mmol/L — AB (ref 96–106)
CO2: 22 mmol/L (ref 20–29)
Calcium: 9.4 mg/dL (ref 8.7–10.3)
Creatinine, Ser: 0.75 mg/dL (ref 0.57–1.00)
GFR calc Af Amer: 83 mL/min/{1.73_m2} (ref >59)
GFR calc non Af Amer: 72 mL/min/{1.73_m2} (ref >59)
Globulin, Total: 2.4 g/dL (ref 1.5–4.5)
Sodium: 134 mmol/L (ref 134–144)
Total Protein: 6.8 g/dL (ref 6.0–8.5)

## 2018-04-29 LAB — CBC WITH DIFFERENTIAL/PLATELET
BASOS ABS: 0.1 10*3/uL (ref 0.0–0.2)
Basos: 1 %
EOS (ABSOLUTE): 0.2 10*3/uL (ref 0.0–0.4)
Eos: 2 %
Hematocrit: 45.2 % (ref 34.0–46.6)
Hemoglobin: 14.8 g/dL (ref 11.1–15.9)
Immature Grans (Abs): 0 10*3/uL (ref 0.0–0.1)
Immature Granulocytes: 0 %
LYMPHS ABS: 3 10*3/uL (ref 0.7–3.1)
LYMPHS: 42 %
MCH: 28.6 pg (ref 26.6–33.0)
MCHC: 32.7 g/dL (ref 31.5–35.7)
MCV: 87 fL (ref 79–97)
MONOS ABS: 0.6 10*3/uL (ref 0.1–0.9)
Monocytes: 8 %
NEUTROS ABS: 3.3 10*3/uL (ref 1.4–7.0)
Neutrophils: 47 %
PLATELETS: 300 10*3/uL (ref 150–450)
RBC: 5.18 x10E6/uL (ref 3.77–5.28)
RDW: 13.1 % (ref 12.3–15.4)
WBC: 7 10*3/uL (ref 3.4–10.8)

## 2018-04-29 LAB — FOLATE

## 2018-04-29 LAB — PHOSPHORUS

## 2018-04-29 LAB — VITAMIN B12: VITAMIN B 12: 425 pg/mL (ref 232–1245)

## 2018-04-29 LAB — MAGNESIUM: MAGNESIUM: 2.4 mg/dL — AB (ref 1.6–2.3)

## 2018-04-29 NOTE — Progress Notes (Signed)
Pt here for BP check BP 165 70 P 82  Rck BP 148 60 P 78

## 2018-05-02 LAB — URINE CULTURE

## 2018-05-03 NOTE — Progress Notes (Signed)
No change in treatment for her blood pressure continue to watch sodium intake and check blood pressures regularly at home and come back to the office in the next 2 to 3 weeks for a repeat with home blood pressure readings in hand

## 2018-05-05 NOTE — Progress Notes (Signed)
Pt called and aware

## 2018-05-09 ENCOUNTER — Telehealth: Payer: Self-pay | Admitting: Family Medicine

## 2018-05-09 MED ORDER — CIPROFLOXACIN HCL 500 MG PO TABS: 500.0000 mg | ORAL_TABLET | Freq: Two times a day (BID) | ORAL | 0 refills | Status: DC

## 2018-05-09 NOTE — Telephone Encounter (Signed)
PT AWARE MED SENT  

## 2018-05-09 NOTE — Telephone Encounter (Signed)
Pt states that AMOX is not helping the uti s/s at all - would like med switched to CIPRO - states that CIPRO always helps   The drug store in [place].

## 2018-05-09 NOTE — Telephone Encounter (Signed)
Change to Cipro 500 mg twice daily with food for 7 days and recheck urinalysis when completed.  Discontinue amoxicillin.

## 2018-05-14 ENCOUNTER — Other Ambulatory Visit: Payer: Self-pay | Admitting: Family Medicine

## 2018-05-14 DIAGNOSIS — E876 Hypokalemia: Secondary | ICD-10-CM

## 2018-05-23 ENCOUNTER — Telehealth: Payer: Self-pay | Admitting: Family Medicine

## 2018-05-24 NOTE — Telephone Encounter (Signed)
Some of the weakness, may be related to her adjusting to the lower blood pressure.  She should continue with current treatment and be patient with the process and hopefully after 4 to 6 weeks she will start feeling more normal and less weak.  She should have a repeat BMP at some point along the way or when she plans to see [place] next.

## 2018-05-24 NOTE — Telephone Encounter (Signed)
Patient states she has been feeling weak all the time. States her bp has been running lower than normal and is wondering if she is feeling weak since she is taking two bp medications ? Amlodipine and lisinopril  05/19/18 BP- 111/47 & 103/57 05/23/18 BP- 129/55 & 113/53  Please advise if patient should cut back on her bp medication.

## 2018-05-24 NOTE — Telephone Encounter (Signed)
Pt aware of [facility] recommendation  BMP ordered

## 2018-05-25 ENCOUNTER — Other Ambulatory Visit: Payer: Medicare Other

## 2018-05-25 DIAGNOSIS — R350 Frequency of micturition: Secondary | ICD-10-CM

## 2018-05-25 LAB — MICROSCOPIC EXAMINATION
RBC MICROSCOPIC, UA: NONE SEEN /HPF (ref 0–2)
RENAL EPITHEL UA: NONE SEEN /HPF

## 2018-05-25 LAB — URINALYSIS, COMPLETE
Bilirubin, UA: NEGATIVE
Glucose, UA: NEGATIVE
Ketones, UA: NEGATIVE
Nitrite, UA: NEGATIVE
PH UA: 5 (ref 5.0–7.5)
Protein, UA: NEGATIVE
RBC, UA: NEGATIVE
Specific Gravity, UA: 1.01 (ref 1.005–1.030)
Urobilinogen, Ur: 0.2 mg/dL (ref 0.2–1.0)

## 2018-05-27 LAB — URINE CULTURE

## 2018-05-31 ENCOUNTER — Encounter: Payer: Self-pay | Admitting: Internal Medicine

## 2018-05-31 ENCOUNTER — Other Ambulatory Visit (INDEPENDENT_AMBULATORY_CARE_PROVIDER_SITE_OTHER): Payer: Medicare Other

## 2018-05-31 ENCOUNTER — Ambulatory Visit: Payer: Medicare Other | Admitting: Internal Medicine

## 2018-05-31 VITALS — BP 130/70 | HR 62 | Ht 61.0 in | Wt 128.5 lb

## 2018-05-31 DIAGNOSIS — K52839 Microscopic colitis, unspecified: Secondary | ICD-10-CM

## 2018-05-31 DIAGNOSIS — K52832 Lymphocytic colitis: Secondary | ICD-10-CM

## 2018-05-31 DIAGNOSIS — K219 Gastro-esophageal reflux disease without esophagitis: Secondary | ICD-10-CM

## 2018-05-31 DIAGNOSIS — R5383 Other fatigue: Secondary | ICD-10-CM

## 2018-05-31 DIAGNOSIS — I10 Essential (primary) hypertension: Secondary | ICD-10-CM | POA: Diagnosis not present

## 2018-05-31 LAB — CBC WITH DIFFERENTIAL/PLATELET
Basophils Absolute: 0 10*3/uL (ref 0.0–0.1)
Basophils Relative: 0.3 % (ref 0.0–3.0)
EOS ABS: 0.2 10*3/uL (ref 0.0–0.7)
Eosinophils Relative: 2.6 % (ref 0.0–5.0)
HEMATOCRIT: 39.3 % (ref 36.0–46.0)
Hemoglobin: 13.1 g/dL (ref 12.0–15.0)
Lymphocytes Relative: 47.9 % — ABNORMAL HIGH (ref 12.0–46.0)
Lymphs Abs: 2.9 10*3/uL (ref 0.7–4.0)
MCHC: 33.5 g/dL (ref 30.0–36.0)
MCV: 87.3 fl (ref 78.0–100.0)
Monocytes Absolute: 0.6 10*3/uL (ref 0.1–1.0)
Monocytes Relative: 9 % (ref 3.0–12.0)
Neutro Abs: 2.5 10*3/uL (ref 1.4–7.7)
Neutrophils Relative %: 40.2 % — ABNORMAL LOW (ref 43.0–77.0)
PLATELETS: 283 10*3/uL (ref 150.0–400.0)
RBC: 4.5 Mil/uL (ref 3.87–5.11)
RDW: 14.3 % (ref 11.5–15.5)
WBC: 6.1 10*3/uL (ref 4.0–10.5)

## 2018-05-31 LAB — FERRITIN: Ferritin: 49.5 ng/mL (ref 10.0–291.0)

## 2018-05-31 LAB — IBC PANEL
IRON: 61 ug/dL (ref 42–145)
Saturation Ratios: 18.9 % — ABNORMAL LOW (ref 20.0–50.0)
Transferrin: 230 mg/dL (ref 212.0–360.0)

## 2018-05-31 MED ORDER — FAMOTIDINE 20 MG PO TABS: 20.0000 mg | ORAL_TABLET | Freq: Two times a day (BID) | ORAL | 2 refills | Status: DC

## 2018-05-31 NOTE — Progress Notes (Signed)
Subjective:    Patient ID: Rachel Walton, female    DOB: [birth year], 85 y.o.   MRN: [id number]  HPI Rachel Walton is an 85 year old female with a history of lymphocytic colitis, longstanding irritable bowel syndrome, colonic diverticulosis, remote colon polyps, GERD, history of peptic ulcer disease here for follow-up.  She was last seen on 04/18/2018.  She is here alone today.  She reports most recently she has been dealing with weakness and fatigue.  Occasional dizziness.  No falls.  She attributes this to change in her blood pressure medication.  She was having issues with significant hypertension and Dr. [name] has helped by changing her medications.  This is resulted in much improved blood pressure.  She states she simply has no energy.  She is having some intermittent chest tightness associated with eating and after eating.  She can feel this occasionally in her back between her shoulder blades.  This does not seem to be exertional for her.  Does not radiate to her left side or jaw.  There is no nausea associated.  She is using Lialda 2.4 g/day but she is off of the budesonide entirely.  She is not had any return of the diarrhea or urgency.  She did eat tomato soup about 3 days ago and had urgent stools but this was isolated.  No blood in her stool or melena.  She does have a nervousness at times during the day which she describes also has a jitteriness.  This is a feeling in her "whole body".  She is using clonazepam 1 or 2 times a day.   Review of Systems As per HPI, otherwise negative  Current Medications, Allergies, Past Medical History, Past Surgical History, Family History and Social History were reviewed in [employer] record.     Objective:   Physical Exam BP 130/70   Pulse 62   Ht 5\' 1"  (1.549 m)   Wt 128 lb 8 oz (58.3 kg)   BMI 24.28 kg/m  Gen: awake, alert, NAD HEENT: anicteric, op clear CV: RRR, no mrg Pulm: CTA b/l Abd: soft, NT/ND, +BS  throughout Ext: no c/c/e Neuro: nonfocal   CBC    Component Value Date/Time   WBC 7.0 04/27/2018 1837   WBC 6.7 04/13/2018 1449   RBC 5.18 04/27/2018 1837   RBC 4.36 04/13/2018 1449   HGB 14.8 04/27/2018 1837   HCT 45.2 04/27/2018 1837   PLT 300 04/27/2018 1837   MCV 87 04/27/2018 1837   MCH 28.6 04/27/2018 1837   MCH 28.9 04/13/2018 1449   MCHC 32.7 04/27/2018 1837   MCHC 32.4 04/13/2018 1449   RDW 13.1 04/27/2018 1837   LYMPHSABS 3.0 04/27/2018 1837   MONOABS 0.4 04/13/2018 1449   EOSABS 0.2 04/27/2018 1837   BASOSABS 0.1 04/27/2018 1837   Lab Results  Component Value Date   TSH 2.080 12/16/2017   Iron/TIBC/Ferritin/ %Sat    Component Value Date/Time   IRON 66 10/10/2015 0107   TIBC 280 10/10/2015 0107   FERRITIN 26 10/10/2015 0107   IRONPCTSAT 24 10/10/2015 0107       Assessment & Plan:  85 year old female with a history of lymphocytic colitis, longstanding irritable bowel syndrome, colonic diverticulosis, remote colon polyps, GERD, history of peptic ulcer disease here for follow-up  1.  Fatigue/hypertension -my suspicion is that her fatigue is related to the adjustment of her blood pressure.  Her blood pressure has decreased to a more normal range and I expect she will  need more time for this equilibration before she begins to feel more normal.  Blood pressure is good today at 130/70.  Pulse is 62.  I am going to check her blood counts and iron studies as well as a B12 today.  2.  GERD --she is having chest symptoms most suspicious for reflux disease.  She has a documented history of reflux disease and is not taking antacid therapy.  We need to avoid PPI with her lymphocytic colitis.  I am going to start Pepcid 20 mg twice daily.  If this symptom worsens or changes she is asked to notify me  3.  Lymphocytic colitis --now under good control after budesonide therapy.  We will continue Lialda at 2.4 g daily hopefully to maintain remission.  Avoid trigger  medications.  Can follow-up in 3 to 6 months, sooner if needed 25 minutes spent with the patient today. Greater than 50% was spent in counseling and coordination of care with the patient

## 2018-05-31 NOTE — Patient Instructions (Signed)
Your provider has requested that you go to the basement level for lab work before leaving today. Press "B" on the elevator. The lab is located at the first door on the left as you exit the elevator.  We have sent the following medications to your pharmacy for you to pick up at your convenience: Pepcid 20 mg twice daily  Make sure if you are going to have dairy products such as milk or ice cream, that you choose lactose free products.  If you are age 83 or older, your body mass index should be between 23-30. Your Body mass index is 24.28 kg/m. If this is out of the aforementioned range listed, please consider follow up with your Primary Care Provider.  If you are age 77 or younger, your body mass index should be between 19-25. Your Body mass index is 24.28 kg/m. If this is out of the aformentioned range listed, please consider follow up with your Primary Care Provider.

## 2018-06-02 LAB — VITAMIN B12: VITAMIN B 12: 309 pg/mL (ref 211–911)

## 2018-06-08 ENCOUNTER — Telehealth: Payer: Self-pay | Admitting: Internal Medicine

## 2018-06-08 ENCOUNTER — Encounter: Payer: Self-pay | Admitting: Gastroenterology

## 2018-06-08 MED ORDER — BUDESONIDE 3 MG PO CPEP: ORAL_CAPSULE | ORAL | 0 refills | Status: DC

## 2018-06-08 NOTE — Telephone Encounter (Signed)
Would wait an additional day and if diarrhea continues tomorrow then resume budesonide at 9 mg daily.  Would plan that dose for 2 to 3 weeks tapering to 6 mg thereafter for 2 to 3 weeks tapering to 3 mg thereafter (assuming improvement) If no improvement she should let me know

## 2018-06-08 NOTE — Telephone Encounter (Signed)
Pt states that yesterday she started having diarrhea again like she did before she took the budesonide. Pt is calling wanting to know if she should resume the budesonide. Please advise.

## 2018-06-08 NOTE — Telephone Encounter (Signed)
Spoke with pt and she is aware. She knows to start the medication if the diarrhea continues tomorrow. Script sent to pharmacy.

## 2018-06-08 NOTE — Telephone Encounter (Signed)
Error

## 2018-08-24 ENCOUNTER — Ambulatory Visit: Payer: Medicare Other | Admitting: Family Medicine

## 2018-09-19 ENCOUNTER — Encounter: Payer: Self-pay | Admitting: Nurse Practitioner

## 2018-09-19 ENCOUNTER — Ambulatory Visit (INDEPENDENT_AMBULATORY_CARE_PROVIDER_SITE_OTHER): Payer: Medicare Other | Admitting: Nurse Practitioner

## 2018-09-19 ENCOUNTER — Other Ambulatory Visit: Payer: Self-pay

## 2018-09-19 VITALS — BP 154/76 | HR 73 | Temp 97.6°F | Ht 61.0 in | Wt 130.0 lb

## 2018-09-19 DIAGNOSIS — R102 Pelvic and perineal pain: Secondary | ICD-10-CM | POA: Diagnosis not present

## 2018-09-19 DIAGNOSIS — J3089 Other allergic rhinitis: Secondary | ICD-10-CM | POA: Diagnosis not present

## 2018-09-19 DIAGNOSIS — H6593 Unspecified nonsuppurative otitis media, bilateral: Secondary | ICD-10-CM | POA: Diagnosis not present

## 2018-09-19 DIAGNOSIS — N3000 Acute cystitis without hematuria: Secondary | ICD-10-CM | POA: Diagnosis not present

## 2018-09-19 DIAGNOSIS — R1024 Suprapubic pain: Secondary | ICD-10-CM

## 2018-09-19 LAB — URINALYSIS, COMPLETE
Bilirubin, UA: NEGATIVE
Glucose, UA: NEGATIVE
Ketones, UA: NEGATIVE
Nitrite, UA: NEGATIVE
Protein,UA: NEGATIVE
RBC, UA: NEGATIVE
Specific Gravity, UA: 1.01 (ref 1.005–1.030)
Urobilinogen, Ur: 0.2 mg/dL (ref 0.2–1.0)
pH, UA: 6 (ref 5.0–7.5)

## 2018-09-19 LAB — MICROSCOPIC EXAMINATION

## 2018-09-19 MED ORDER — CIPROFLOXACIN HCL 500 MG PO TABS: 500.0000 mg | ORAL_TABLET | Freq: Two times a day (BID) | ORAL | 0 refills | Status: DC

## 2018-09-19 MED ORDER — FLUTICASONE PROPIONATE 50 MCG/ACT NA SUSP: 2.0000 | Freq: Every day | NASAL | 6 refills | Status: DC

## 2018-09-19 NOTE — Patient Instructions (Signed)

## 2018-09-19 NOTE — Progress Notes (Signed)
Subjective:    Patient ID: Rachel Walton, female    DOB: 1930-02-04, 85 y.o.   MRN: [id number]   Chief Complaint: Abdominal Pain and Wants ears checked   HPI patient comes in today with several complaints: - ears stopped up- wants them looked at tp make sure they do not have wax in them. - she is c/o sneezing a lot in the last several days. Has not tired any allergy meds. - pelvic pain, does not feel like she is emptying her bladder. deneies any burning. Slight increase in frequency.   Review of Systems  Constitutional: Negative.   HENT: Positive for hearing loss and sneezing. Negative for ear discharge and ear pain.   Respiratory: Negative.   Genitourinary: Positive for frequency, pelvic pain and urgency. Negative for dysuria.  Neurological: Negative.   Psychiatric/Behavioral: Negative.   All other systems reviewed and are negative.      Objective:   Physical Exam Vitals signs and nursing note reviewed.  Constitutional:      Appearance: She is well-developed.  HENT:     Head: Normocephalic.     Right Ear: Ear canal and external ear normal. A middle ear effusion is present.     Left Ear: Ear canal and external ear normal. A middle ear effusion is present.     Mouth/Throat:     Mouth: Mucous membranes are moist.     Pharynx: Oropharynx is clear.  Eyes:     Extraocular Movements: Extraocular movements intact.     Pupils: Pupils are equal, round, and reactive to light.  Neck:     Musculoskeletal: Normal range of motion and neck supple.  Cardiovascular:     Rate and Rhythm: Normal rate and regular rhythm.     Heart sounds: Normal heart sounds.  Pulmonary:     Effort: Pulmonary effort is normal.     Breath sounds: Normal breath sounds.  Abdominal:     General: Abdomen is flat. Bowel sounds are normal.     Palpations: Abdomen is soft.     Tenderness: There is abdominal tenderness (suprapubic pressure on palpation).  Musculoskeletal: Normal range of motion.  Skin:   General: Skin is warm.  Neurological:     General: No focal deficit present.     Mental Status: She is alert and oriented to person, place, and time.  Psychiatric:        Mood and Affect: Mood normal.        Behavior: Behavior normal.     BP (!) 154/76   Pulse 73   Temp 97.6 F (36.4 C) (Oral)   Ht 5\' 1"  (1.549 m)   Wt 130 lb (59 kg)   BMI 24.56 kg/m        Assessment & Plan:  THECLA FORGIONE comes in today with chief complaint of Abdominal Pain and Wants ears checked   Diagnosis and orders addressed:  1. Suprapubic pain - Urinalysis, Complete  2. Acute cystitis without hematuria Take medication as prescribe Cotton underwear Take shower not bath Cranberry juice, yogurt Force fluids AZO over the counter X2 days Culture pending RTO prn - Urine Culture - ciprofloxacin (CIPRO) 500 MG tablet; Take 1 tablet (500 mg total) by mouth 2 (two) times daily.  Dispense: 14 tablet; Refill: 0  3. Fluid level behind tympanic membrane of both ears - fluticasone (FLONASE) 50 MCG/ACT nasal spray; Place 2 sprays into both nostrils daily.  Dispense: 16 g; Refill: 6  4. Allergic rhinitis due to  fungal spores, unspecified seasonality - fluticasone (FLONASE) 50 MCG/ACT nasal spray; Place 2 sprays into both nostrils daily.  Dispense: 16 g; Refill: 6   Follow up plan: prn   Mary-Margaret Hassell Done, FNP

## 2018-09-22 LAB — URINE CULTURE

## 2018-09-27 ENCOUNTER — Ambulatory Visit: Payer: Medicare Other | Admitting: Family Medicine

## 2018-09-27 ENCOUNTER — Other Ambulatory Visit: Payer: Self-pay

## 2018-09-28 ENCOUNTER — Other Ambulatory Visit: Payer: Self-pay

## 2018-09-28 ENCOUNTER — Encounter: Payer: Self-pay | Admitting: Nurse Practitioner

## 2018-09-28 ENCOUNTER — Ambulatory Visit: Payer: Medicare Other | Admitting: Nurse Practitioner

## 2018-09-28 ENCOUNTER — Telehealth: Payer: Self-pay | Admitting: Family Medicine

## 2018-09-28 DIAGNOSIS — I10 Essential (primary) hypertension: Secondary | ICD-10-CM | POA: Diagnosis not present

## 2018-09-28 MED ORDER — LISINOPRIL 20 MG PO TABS: 20.0000 mg | ORAL_TABLET | Freq: Every day | ORAL | 3 refills | Status: DC

## 2018-09-28 MED ORDER — NEBIVOLOL HCL 2.5 MG PO TABS: 2.5000 mg | ORAL_TABLET | Freq: Every day | ORAL | 2 refills | Status: DC

## 2018-09-28 NOTE — Progress Notes (Signed)
   Subjective:    Patient ID: Rachel Walton, female    DOB: 1929/07/21, 85 y.o.   MRN: [id number]   Chief Complaint: bilateral ankles swelling   HPI Patient in today c/o bil lower ext edema. She says that her blood pressure has been up and Dr. [name] started her on amlodipine which she thinks caused the swelling so she stopped taking it  but that made her nervous so she went back on it. Blood  Pressure has been running a little high. BP Readings from Last 3 Encounters:  09/28/18 (!) 188/75  09/19/18 (!) 154/76  05/31/18 130/70      Review of Systems  Constitutional: Negative.   HENT: Negative.   Respiratory: Negative.   Cardiovascular: Positive for leg swelling.  Gastrointestinal: Negative.   Genitourinary: Negative.   Neurological: Negative.   Psychiatric/Behavioral: Negative.   All other systems reviewed and are negative.      Objective:   Physical Exam Vitals signs and nursing note reviewed.  Constitutional:      Appearance: Normal appearance.  Cardiovascular:     Rate and Rhythm: Regular rhythm.     Heart sounds: Normal heart sounds.  Pulmonary:     Breath sounds: Normal breath sounds.  Musculoskeletal:     Right lower leg: No edema.     Left lower leg: No edema.  Skin:    General: Skin is warm and dry.  Neurological:     General: No focal deficit present.     Mental Status: She is alert and oriented to person, place, and time.  Psychiatric:        Mood and Affect: Mood normal.        Behavior: Behavior normal.    BP (!) 188/75   Pulse 64   Temp (!) 97.3 F (36.3 C) (Oral)   Ht 5\' 1"  (1.549 m)   Wt 128 lb (58.1 kg)   BMI 24.19 kg/m        Assessment & Plan:  SHAUNAE SIELOFF in today with chief complaint of bilateral ankles swelling   1. Essential hypertension Continue lisinopril at 20 mg- she say sit has been increased to 40mg  in past and that made blood pressure drop to low Will add bystolic 2.5mg  for now- she has taken motprolol in past and  could not tolerate but does not remember what it did to her. Keep diary of blood pressure - lisinopril (ZESTRIL) 20 MG tablet; Take 1 tablet (20 mg total) by mouth daily.  Dispense: 90 tablet; Refill: 3 - nebivolol (BYSTOLIC) 2.5 MG tablet; Take 1 tablet (2.5 mg total) by mouth daily.  Dispense: 30 tablet; Refill: 2  Mary-Margaret Hassell Done, FNP

## 2018-09-28 NOTE — Telephone Encounter (Signed)
Bilateral edema from knee down - compression hose is getting really hard to put on.  appt made for pt to come in today with MMM

## 2018-09-28 NOTE — Telephone Encounter (Signed)
Pt states that she is wanting to speak to jamie about her Feet swelling the pt has read on line about side effects of lisinopril and it is feet and leg swelling, pt is wanting to know if she should continue with with medication

## 2018-10-06 ENCOUNTER — Ambulatory Visit: Payer: Medicare Other | Admitting: Family Medicine

## 2018-10-12 ENCOUNTER — Encounter: Payer: Self-pay | Admitting: Family Medicine

## 2018-10-12 ENCOUNTER — Ambulatory Visit (INDEPENDENT_AMBULATORY_CARE_PROVIDER_SITE_OTHER): Payer: Medicare Other

## 2018-10-12 ENCOUNTER — Ambulatory Visit: Payer: Medicare Other | Admitting: Family Medicine

## 2018-10-12 ENCOUNTER — Other Ambulatory Visit: Payer: Self-pay

## 2018-10-12 VITALS — Ht 61.0 in

## 2018-10-12 DIAGNOSIS — M25571 Pain in right ankle and joints of right foot: Secondary | ICD-10-CM

## 2018-10-17 ENCOUNTER — Telehealth: Payer: Self-pay | Admitting: Family Medicine

## 2018-10-17 NOTE — Progress Notes (Signed)
Patient did x-ray and then left without being seen by provider.

## 2018-10-17 NOTE — Telephone Encounter (Signed)
Pt is calling to get xray results. 

## 2018-10-17 NOTE — Telephone Encounter (Signed)
No started this was supposed to go to the pool

## 2018-10-17 NOTE — Telephone Encounter (Signed)
Patient aware.

## 2018-10-17 NOTE — Telephone Encounter (Signed)
Did you mean to send to me?

## 2018-10-17 NOTE — Telephone Encounter (Signed)
Ankle x-ray was normal, no signs of shifting or fracture, there is mild arthritis but otherwise normal.

## 2018-10-25 ENCOUNTER — Ambulatory Visit: Payer: Medicare Other | Admitting: Family Medicine

## 2018-11-08 ENCOUNTER — Other Ambulatory Visit: Payer: Self-pay

## 2018-11-09 ENCOUNTER — Encounter: Payer: Self-pay | Admitting: Family Medicine

## 2018-11-09 ENCOUNTER — Ambulatory Visit: Payer: Medicare Other | Admitting: Family Medicine

## 2018-11-09 VITALS — BP 178/73 | HR 64 | Temp 97.1°F | Ht 61.0 in | Wt 128.0 lb

## 2018-11-09 DIAGNOSIS — R251 Tremor, unspecified: Secondary | ICD-10-CM

## 2018-11-09 DIAGNOSIS — E876 Hypokalemia: Secondary | ICD-10-CM | POA: Diagnosis not present

## 2018-11-09 DIAGNOSIS — Z79899 Other long term (current) drug therapy: Secondary | ICD-10-CM

## 2018-11-09 DIAGNOSIS — I10 Essential (primary) hypertension: Secondary | ICD-10-CM

## 2018-11-09 DIAGNOSIS — F411 Generalized anxiety disorder: Secondary | ICD-10-CM

## 2018-11-09 DIAGNOSIS — R1013 Epigastric pain: Secondary | ICD-10-CM

## 2018-11-09 MED ORDER — CLONAZEPAM 0.5 MG PO TABS: 0.2500 mg | ORAL_TABLET | Freq: Two times a day (BID) | ORAL | 0 refills | Status: DC | PRN

## 2018-11-09 MED ORDER — POTASSIUM CHLORIDE ER 10 MEQ PO TBCR: EXTENDED_RELEASE_TABLET | ORAL | 1 refills | Status: DC

## 2018-11-09 NOTE — Patient Instructions (Signed)

## 2018-11-09 NOTE — Progress Notes (Signed)
Subjective: CC:  Hypertension PCP: Janora Norlander, DO XBL:[id number] R Fuchs is a 85 y.o. female presenting to clinic today for:  1. Hypertension Patient reports compliance with Lisinopril 20mg  daily. Denies CP, SOB.  She occasionally has RLE edema.  Notes history of injury to that ankle in the past.  She feels that swelling is worse with use of an ankle brace.  2. Abdominal pain Reports some reflux symptoms.  She questions hiatal hernia and would like to be checked out for this.  Previously seen by Dr Olevia Perches for colon cancer screening.  No GI bleeds, no vomiting.  3. Tremor/ anxiety disorder Long standing history of tremor. She has been evaluated by Neurology previously and told that there was nothing to be done about it.  She brings in a bottle of Propranolol which she took before and notes it did not work.  She has Klonopin rx'd for anxiety and tremor.  She takes 1 tablet once daily, even though it's prescribed twice daily.  Denies falls but does state she feels off balance sometimes.  She has hx neck injury/ surgery w/ Dr Rita Ohara.     ROS: Per HPI  Allergies  Allergen Reactions  . Alendronate Sodium Other (See Comments)    Caused chest pain  . Lexapro [Escitalopram Oxalate] Other (See Comments)    Makes lymphangitic colitis worse  . Amoxicillin     875 mg only . Can take 500 mg when lesser strength  . Aspirin Other (See Comments)    Per the patient, made the gastric ulcer worse.  . Budesonide Other (See Comments)    Blurry vision, shaking inside her body  . Cephalexin   . Clindamycin/Lincomycin   . Keflex [Cephalexin]   . Levofloxacin Other (See Comments)    Insomnia and headache  . Prednisone Other (See Comments)    Reaction is unknown..then states nervousness  . Sudafed [Pseudoephedrine Hcl] Other (See Comments)    insomnia  . Sulfa Antibiotics Hives  . Toprol Xl [Metoprolol Succinate] Other (See Comments)    Unknown reaction  . Trimethoprim     Unknown reaction    Past Medical History:  Diagnosis Date  . Acute gastric ulcer   . Allergy   . Anxiety   . Atrophic vaginitis   . Barrett's esophagus   . Basal cell carcinoma of nose     under nose  . Cataract   . Depression   . Diverticulosis   . Esophageal stricture   . Esophageal ulcer   . Fatty liver   . GERD (gastroesophageal reflux disease)   . Heart murmur   . Hiatal hernia   . Hx of adenomatous colonic polyps   . Hyperlipidemia   . Hypertension   . Hypokalemia   . IBS (irritable bowel syndrome)   . Kyphosis   . Lymphocytic colitis   . Macular degeneration    "?wet or dry; ? both eyes"  . 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 Osteopenia of the elderly   . Pneumonia X 1  . Postmenopausal   . Recurrent UTI   . 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 Tremor   . Vitamin D deficiency     Current Outpatient Medications:  .  amLODipine (NORVASC) 2.5 MG tablet, , Disp: , Rfl:  .  Calcium Carbonate-Vitamin D (CALCIUM 600+D) 600-200 MG-UNIT TABS, Take 1 tablet by mouth daily. Take with a meal (Patient taking differently: Take 1-2 tablets by mouth daily. Take  with a meal), Disp: , Rfl: 0 .  clonazePAM (KLONOPIN) 0.5 MG tablet, TAKE ONE TABLET TWICE DAILY AS NEEDED, Disp: 180 tablet, Rfl: 0 .  fexofenadine (ALLEGRA) 180 MG tablet, Take 180 mg by mouth daily., Disp: , Rfl:  .  fluticasone (FLONASE) 50 MCG/ACT nasal spray, Place 2 sprays into both nostrils daily., Disp: 16 g, Rfl: 6 .  lisinopril (ZESTRIL) 20 MG tablet, Take 1 tablet (20 mg total) by mouth daily., Disp: 90 tablet, Rfl: 3 .  mesalamine (LIALDA) 1.2 g EC tablet, Take 2 tablets (2.4 g total) by mouth daily with breakfast. TAKE 2 TABLETS EVERY MORNING WITH BREAKFAST (Patient taking differently: Take 2.4 g by mouth daily. TAKE 2 TABLETS EVERY Night), Disp: 60 tablet, Rfl: 0 .  Multiple Vitamins-Minerals (PRESERVISION AREDS 2) CHEW, Chew 2 each by mouth daily., Disp: , Rfl:  .  OVER THE COUNTER MEDICATION, Stress  tab energy- one a day, Disp: , Rfl:  .  potassium chloride (K-DUR) 10 MEQ tablet, TAKE ONE (1) TABLET EACH DAY, Disp: 90 tablet, Rfl: 0 .  Wheat Dextrin (BENEFIBER) POWD, Take 1 Dose by mouth daily as needed. , Disp: , Rfl:  Social History   Socioeconomic History  . Marital status: Married    Spouse name: Not on file  . Number of children: 3  . Years of education: Not on file  . Highest education level: Not on file  Occupational History  . Occupation: Retired  Scientific laboratory technician  . Financial resource strain: Not on file  . Food insecurity    Worry: Not on file    Inability: Not on file  . Transportation needs    Medical: Not on file    Non-medical: Not on file  Tobacco Use  . Smoking status: Never Smoker  . Smokeless tobacco: Never Used  Substance and Sexual Activity  . Alcohol use: No    Alcohol/week: 0.0 standard drinks  . Drug use: No  . Sexual activity: Yes  Lifestyle  . Physical activity    Days per week: Not on file    Minutes per session: Not on file  . Stress: Not on file  Relationships  . Social Herbalist on phone: Not on file    Gets together: Not on file    Attends religious service: Not on file    Active member of club or organization: Not on file    Attends meetings of clubs or organizations: Not on file    Relationship status: Not on file  . Intimate partner violence    Fear of current or ex partner: Not on file    Emotionally abused: Not on file    Physically abused: Not on file    Forced sexual activity: Not on file  Other Topics Concern  . Not on file  Social History Narrative  . Not on file   Family History  Problem Relation Age of Onset  . Stroke Mother   . Heart disease Father        MI  . Heart attack Father   . Cancer Sister        Brain tumor  . Hypertension Sister   . Osteoporosis Sister   . Colon cancer Brother   . Heart disease Brother   . Diabetes Son 8  . Diabetes Maternal Uncle   . Diabetes Son 30  . Hypertension  Sister   . Breast cancer Sister 46  . Hypertension Sister   . Cancer Sister  breast  . Osteoporosis Sister   . Hypertension Sister   . Hypertension Sister   . Stroke Sister   . Hypertension Sister   . Hypertension Sister   . Colon cancer Other        nephew  . Brain cancer Son        deceased  . Cancer Son        brain  . Thyroid cancer Other        niece  . Breast cancer Other   . Breast cancer Other   . Breast cancer Other   . Colon polyps Neg Hx   . Esophageal cancer Neg Hx   . Rectal cancer Neg Hx   . Stomach cancer Neg Hx     Objective: Office vital signs reviewed. BP (!) 178/73   Pulse 64   Temp (!) 97.1 F (36.2 C) (Oral)   Ht 5\' 1"  (1.549 m)   Wt 128 lb (58.1 kg)   BMI 24.19 kg/m   Physical Examination:  General: Awake, alert, well appearing, head tremor noted, No acute distress HEENT: Normal, sclera white, MMM, EOMI, PERRL Cardio: regular rate and rhythm, S1S2 heard, no murmurs appreciated Pulm: clear to auscultation bilaterally, no wheezes, rhonchi or rales; normal work of breathing on room air Extremities: warm, well perfused, trace ankle edema R>L.  No cyanosis or clubbing; +2 pulses bilaterally MSK: slow gait Skin: dry; intact; no rashes or lesions Neuro: resting tremor noted.   Assessment/ Plan: 85 y.o. female   1. Essential hypertension Not controlled. Possible anxiety mediated as this is our first appointment.  Plan for 2 week recheck with RN.  If persistently elevated, will increase lisinopril to 40mg  daily.  2. Generalized anxiety disorder The Narcotic Database has been reviewed.  There were no red flags.   - clonazePAM (KLONOPIN) 0.5 MG tablet; Take 0.5-1 tablets (0.25-0.5 mg total) by mouth 2 (two) times daily as needed for anxiety.  Dispense: 180 tablet; Refill: 0  3. Low serum potassium level Check BMP - potassium chloride (K-DUR) 10 MEQ tablet; TAKE ONE (1) TABLET EACH DAY  Dispense: 90 tablet; Refill: 1 - Basic Metabolic  Panel  4. Tremor Referral to neurology placed. - clonazePAM (KLONOPIN) 0.5 MG tablet; Take 0.5-1 tablets (0.25-0.5 mg total) by mouth 2 (two) times daily as needed for anxiety.  Dispense: 180 tablet; Refill: 0  5. Controlled substance agreement signed - Tox[employer]SURE [place] 13 (MW), Urine  6. Epigastric pain - Ambulatory referral to Gastroenterology   Orders Placed This Encounter  Procedures  . Basic Metabolic Panel  . ToxASSURE Select 13 (MW), Urine  . Ambulatory referral to Gastroenterology    Referral Priority:   Routine    Referral Type:   Consultation    Referral Reason:   Specialty Services Required    Number of Visits Requested:   1  . Ambulatory referral to Neurology    Referral Priority:   Routine    Referral Type:   Consultation    Referral Reason:   Specialty Services Required    Requested Specialty:   Neurology    Number of Visits Requested:   1   Meds ordered this encounter  Medications  . potassium chloride (K-DUR) 10 MEQ tablet    Sig: TAKE ONE (1) TABLET EACH DAY    Dispense:  90 tablet    Refill:  1  . clonazePAM (KLONOPIN) 0.5 MG tablet    Sig: Take 0.5-1 tablets (0.25-0.5 mg total) by mouth 2 (two) times  daily as needed for anxiety.    Dispense:  180 tablet    Refill:  [place], DO [place] [phone]

## 2018-11-10 ENCOUNTER — Telehealth: Payer: Self-pay

## 2018-11-10 LAB — BASIC METABOLIC PANEL
BUN/Creatinine Ratio: 9 — ABNORMAL LOW (ref 12–28)
BUN: 6 mg/dL — ABNORMAL LOW (ref 8–27)
CO2: 25 mmol/L (ref 20–29)
Calcium: 9.3 mg/dL (ref 8.7–10.3)
Chloride: 103 mmol/L (ref 96–106)
Creatinine, Ser: 0.7 mg/dL (ref 0.57–1.00)
GFR calc Af Amer: 91 mL/min/{1.73_m2} (ref >59)
GFR calc non Af Amer: 79 mL/min/{1.73_m2} (ref >59)
Glucose: 88 mg/dL (ref 65–99)
Potassium: 3.2 mmol/L — ABNORMAL LOW (ref 3.5–5.2)
Sodium: 146 mmol/L — ABNORMAL HIGH (ref 134–144)

## 2018-11-10 NOTE — Telephone Encounter (Signed)
Apt scheduled.  

## 2018-11-10 NOTE — Telephone Encounter (Signed)
-----   Message from Janora Norlander, DO sent at 11/09/2018  5:29 PM EDT ----- Please call patient to schedule f/u w RN for BP check

## 2018-11-11 LAB — TOXASSURE SELECT 13 (MW), URINE

## 2018-11-15 ENCOUNTER — Ambulatory Visit: Payer: Medicare Other | Admitting: *Deleted

## 2018-11-15 ENCOUNTER — Other Ambulatory Visit: Payer: Self-pay

## 2018-11-16 ENCOUNTER — Ambulatory Visit (INDEPENDENT_AMBULATORY_CARE_PROVIDER_SITE_OTHER): Payer: Medicare Other | Admitting: Physician Assistant

## 2018-11-16 ENCOUNTER — Encounter: Payer: Self-pay | Admitting: Physician Assistant

## 2018-11-16 DIAGNOSIS — J0110 Acute frontal sinusitis, unspecified: Secondary | ICD-10-CM | POA: Diagnosis not present

## 2018-11-16 MED ORDER — AMOXICILLIN 250 MG PO CAPS: 250.0000 mg | ORAL_CAPSULE | Freq: Three times a day (TID) | ORAL | 0 refills | Status: DC

## 2018-11-16 NOTE — Progress Notes (Signed)
Telephone visit  Subjective: NF:AOZHY PCP: Janora Norlander, DO QMV:HQION Rachel Walton is a 85 y.o. female calls for telephone consult today. Patient provides verbal consent for consult held via phone.  Patient is identified with 2 separate identifiers.  At this time the entire area is on COVID-19 social distancing and stay home orders are in place.  Patient is of higher risk and therefore we are performing this by a virtual method.  Location of patient: home Location of provider: [facility] Others present for call: no  This patient is having a telephone visit for the problem of fever.  She states is going on a couple of days.  She has been very stopped up over recent weeks.  She does have a lot of allergy problems she is using her Flonase and her Allegra.  She does have pressure across her forehead the most.  She has not seen any blood.  She states she does have a little bit of hoarseness.  She denies any fever, chills, cough, wheezing.  She denies any nausea vomiting or diarrhea.  She has not been around anyone with a COVID infection.   ROS: Per HPI  Allergies  Allergen Reactions  . Alendronate Sodium Other (See Comments)    Caused chest pain  . Lexapro [Escitalopram Oxalate] Other (See Comments)    Makes lymphangitic colitis worse  . Amoxicillin     875 mg only . Can take 500 mg when lesser strength  . Aspirin Other (See Comments)    Per the patient, made the gastric ulcer worse.  . Budesonide Other (See Comments)    Blurry vision, shaking inside her body  . Cephalexin   . Clindamycin/Lincomycin   . Keflex [Cephalexin]   . Levofloxacin Other (See Comments)    Insomnia and headache  . Prednisone Other (See Comments)    Reaction is unknown..then states nervousness  . Sudafed [Pseudoephedrine Hcl] Other (See Comments)    insomnia  . Sulfa Antibiotics Hives  . Toprol Xl [Metoprolol Succinate] Other (See Comments)    Unknown reaction  . Trimethoprim     Unknown reaction    Past Medical History:  Diagnosis Date  . Acute gastric ulcer   . Allergy   . Anxiety   . Atrophic vaginitis   . Barrett's esophagus   . Basal cell carcinoma of nose     under nose  . Cataract   . Depression   . Diverticulosis   . Esophageal stricture   . Esophageal ulcer   . Fatty liver   . GERD (gastroesophageal reflux disease)   . Heart murmur   . Hiatal hernia   . Hx of adenomatous colonic polyps   . Hyperlipidemia   . Hypertension   . Hypokalemia   . IBS (irritable bowel syndrome)   . Kyphosis   . Lymphocytic colitis   . Macular degeneration    "?wet or dry; ? both eyes"  . 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 Osteopenia of the elderly   . Pneumonia X 1  . Postmenopausal   . Recurrent UTI   . 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 Tremor   . Vitamin D deficiency     Current Outpatient Medications:  .  amLODipine (NORVASC) 2.5 MG tablet, , Disp: , Rfl:  .  amoxicillin (AMOXIL) 250 MG capsule, Take 1 capsule (250 mg total) by mouth 3 (three) times daily., Disp: 30 capsule, Rfl: 0 .  Calcium Carbonate-Vitamin  D (CALCIUM 600+D) 600-200 MG-UNIT TABS, Take 1 tablet by mouth daily. Take with a meal (Patient taking differently: Take 1-2 tablets by mouth daily. Take with a meal), Disp: , Rfl: 0 .  clonazePAM (KLONOPIN) 0.5 MG tablet, Take 0.5-1 tablets (0.25-0.5 mg total) by mouth 2 (two) times daily as needed for anxiety., Disp: 180 tablet, Rfl: 0 .  fexofenadine (ALLEGRA) 180 MG tablet, Take 180 mg by mouth daily., Disp: , Rfl:  .  fluticasone (FLONASE) 50 MCG/ACT nasal spray, Place 2 sprays into both nostrils daily., Disp: 16 g, Rfl: 6 .  lisinopril (ZESTRIL) 20 MG tablet, Take 1 tablet (20 mg total) by mouth daily., Disp: 90 tablet, Rfl: 3 .  mesalamine (LIALDA) 1.2 g EC tablet, Take 2 tablets (2.4 g total) by mouth daily with breakfast. TAKE 2 TABLETS EVERY MORNING WITH BREAKFAST (Patient taking differently: Take 2.4 g by mouth daily. TAKE 2 TABLETS  EVERY Night), Disp: 60 tablet, Rfl: 0 .  Multiple Vitamins-Minerals (PRESERVISION AREDS 2) CHEW, Chew 2 each by mouth daily., Disp: , Rfl:  .  OVER THE COUNTER MEDICATION, Stress tab energy- one a day, Disp: , Rfl:  .  potassium chloride (K-DUR) 10 MEQ tablet, TAKE ONE (1) TABLET EACH DAY, Disp: 90 tablet, Rfl: 1 .  Wheat Dextrin (BENEFIBER) POWD, Take 1 Dose by mouth daily as needed. , Disp: , Rfl:   Assessment/ Plan: 85 y.o. female   1. Acute non-recurrent frontal sinusitis - amoxicillin (AMOXIL) 250 MG capsule; Take 1 capsule (250 mg total) by mouth 3 (three) times daily.  Dispense: 30 capsule; Refill: 0   No follow-ups on file.  Continue all other maintenance medications as listed above.  Start time: 9:27 AM End time: 9:36 AM  Meds ordered this encounter  Medications  . amoxicillin (AMOXIL) 250 MG capsule    Sig: Take 1 capsule (250 mg total) by mouth 3 (three) times daily.    Dispense:  30 capsule    Refill:  0    Order Specific Question:   Supervising Provider    Answer:   Janora Norlander [[id number]]    Particia Nearing PA-C [place] [phone]

## 2018-11-18 ENCOUNTER — Emergency Department (HOSPITAL_COMMUNITY)
Admission: EM | Admit: 2018-11-18 | Discharge: 2018-11-18 | Disposition: A | Discharge status: Home / Self Care | Payer: Medicare Other | Attending: Emergency Medicine | Admitting: Emergency Medicine

## 2018-11-18 ENCOUNTER — Other Ambulatory Visit: Payer: Self-pay

## 2018-11-18 ENCOUNTER — Encounter (HOSPITAL_COMMUNITY): Payer: Self-pay

## 2018-11-18 DIAGNOSIS — I10 Essential (primary) hypertension: Secondary | ICD-10-CM | POA: Diagnosis not present

## 2018-11-18 DIAGNOSIS — Z79899 Other long term (current) drug therapy: Secondary | ICD-10-CM | POA: Diagnosis not present

## 2018-11-18 DIAGNOSIS — R21 Rash and other nonspecific skin eruption: Secondary | ICD-10-CM | POA: Diagnosis not present

## 2018-11-18 DIAGNOSIS — T7840XA Allergy, unspecified, initial encounter: Secondary | ICD-10-CM | POA: Diagnosis not present

## 2018-11-18 NOTE — ED Provider Notes (Signed)
[facility] EMERGENCY DEPARTMENT Provider Note   CSN: [id number] Arrival date & time: 11/18/18  0203  Time seen 3:55 AM  History   Chief Complaint Chief Complaint  Patient presents with  . Rash    HPI Rachel Walton is a 85 y.o. female.     HPI  Patient relates that about 2 or 3 months ago she was wearing socks and she slipped and twisted her right ankle.  She states since then she has pain off and on that gets better when she puts it up.  She states sometimes she has some swelling.  She was seen today, July 9 by her primary care doctor and was started on amoxicillin.  She has multiple antibiotic allergies including an allergy to amoxicillin but states she can take "low-dose" amoxicillin without any problem.  She took the first dose of 250 mg at 9 AM.  She states she felt fine all day however when she went to bed she noticed she had a rash on her chest and her extremities.  She denies any itching.  She denies any nausea or vomiting.  Patient states she has had foot surgery done at [facility] in the past.  PCP Janora Norlander, DO   Past Medical History:  Diagnosis Date  . Acute gastric ulcer   . Allergy   . Anxiety   . Atrophic vaginitis   . Barrett's esophagus   . Basal cell carcinoma of nose     under nose  . Cataract   . Depression   . Diverticulosis   . Esophageal stricture   . Esophageal ulcer   . Fatty liver   . GERD (gastroesophageal reflux disease)   . Heart murmur   . Hiatal hernia   . Hx of adenomatous colonic polyps   . Hyperlipidemia   . Hypertension   . Hypokalemia   . IBS (irritable bowel syndrome)   . Kyphosis   . Lymphocytic colitis   . Macular degeneration    "?wet or dry; ? both eyes"  . 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 Osteopenia of the elderly   . Pneumonia X 1  . Postmenopausal   . Recurrent UTI   . 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 Tremor   . Vitamin D deficiency     Patient Active Problem  List   Diagnosis Date Noted  . Osteopenia with high risk of fracture 10/21/2016  . Essential tremor 05/20/2016  . Age-related macular degeneration, dry, both eyes 04/07/2016  . Basal cell carcinoma of nose 02/05/2016  . Lymphocytic colitis 12/20/2015  . Barrett's esophagus 12/20/2015  . 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 Depression 11/07/2015  . Chest pain 10/09/2015  . Hypokalemia 10/09/2015  . Anemia 10/09/2015  . Blepharoptosis, acquired, 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  . Esophageal stricture 12/11/2013  . Allergic rhinitis due to pollen 12/11/2013  . Essential hypertension 12/11/2013  . Amblyopia ex anopsia of right eye 12/07/2013  . Generalized anxiety disorder 08/21/2013  . Gall stones 06/15/2013  . Degenerative drusen 05/05/2013  . Hyperlipemia 11/21/2012  . Postmenopausal   . Colon polyp   . Osteopenia of the elderly   . Kyphosis   . Recurrent UTI   . Atrophic vaginitis   . Diverticulosis     Past Surgical History:  Procedure Laterality Date  . ABDOMINAL HYSTERECTOMY  1980  . APPENDECTOMY    . BACK SURGERY    . BREAST BIOPSY Left   . CATARACT EXTRACTION W/  INTRAOCULAR LENS  IMPLANT, BILATERAL Bilateral   . COLONOSCOPY    . ESOPHAGOGASTRODUODENOSCOPY (EGD) WITH ESOPHAGEAL DILATION  X 1  . EYE SURGERY    . LUMBAR DISC SURGERY     "fragmented disc"  . POLYPECTOMY    . SQUAMOUS CELL CARCINOMA EXCISION     nose tip     OB History   No obstetric history on file.      Home Medications    Prior to Admission medications   Medication Sig Start Date End Date Taking? Authorizing Provider  amLODipine (NORVASC) 2.5 MG tablet  07/06/18   [provider]  amoxicillin (AMOXIL) 250 MG capsule Take 1 capsule (250 mg total) by mouth 3 (three) times daily. 11/16/18   Terald Sleeper, PA-C  Calcium Carbonate-Vitamin D (CALCIUM 600+D) 600-200 MG-UNIT TABS Take 1 tablet by mouth daily. Take with a meal Patient taking differently: Take 1-2 tablets by mouth daily. Take with a  meal 12/30/15   Cherre Robins, PharmD  clonazePAM (KLONOPIN) 0.5 MG tablet Take 0.5-1 tablets (0.25-0.5 mg total) by mouth 2 (two) times daily as needed for anxiety. 11/09/18   Janora Norlander, DO  fexofenadine (ALLEGRA) 180 MG tablet Take 180 mg by mouth daily.    [provider]  fluticasone (FLONASE) 50 MCG/ACT nasal spray Place 2 sprays into both nostrils daily. 09/19/18   Hassell Done, Mary-Margaret, FNP  lisinopril (ZESTRIL) 20 MG tablet Take 1 tablet (20 mg total) by mouth daily. 09/28/18   Hassell Done, Mary-Margaret, FNP  mesalamine (LIALDA) 1.2 g EC tablet Take 2 tablets (2.4 g total) by mouth daily with breakfast. TAKE 2 TABLETS EVERY MORNING WITH BREAKFAST Patient taking differently: Take 2.4 g by mouth daily. TAKE 2 TABLETS EVERY Night 04/27/18   Claretta Fraise, MD  Multiple Vitamins-Minerals (PRESERVISION AREDS 2) CHEW Chew 2 each by mouth daily.    [provider]  OVER THE COUNTER MEDICATION Stress tab energy- one a day    [provider]  potassium chloride (K-DUR) 10 MEQ tablet TAKE ONE (1) TABLET EACH DAY 11/09/18   Gottschalk, Ashly M, DO  Wheat Dextrin (BENEFIBER) POWD Take 1 Dose by mouth daily as needed.     [provider]    Family History Family History  Problem Relation Age of Onset  . Stroke Mother   . Heart disease Father        MI  . Heart attack Father   . Cancer Sister        Brain tumor  . Hypertension Sister   . Osteoporosis Sister   . Colon cancer Brother   . Heart disease Brother   . Diabetes Son 8  . Diabetes Maternal Uncle   . Diabetes Son 60  . Hypertension Sister   . Breast cancer Sister 69  . Hypertension Sister   . Cancer Sister        breast  . Osteoporosis Sister   . Hypertension Sister   . Hypertension Sister   . Stroke Sister   . Hypertension Sister   . Hypertension Sister   . Colon cancer Other        nephew  . Brain cancer Son        deceased  . Cancer Son        brain  . Thyroid cancer Other         niece  . Breast cancer Other   . Breast cancer Other   . Breast cancer Other   . Colon polyps  Neg Hx   . Esophageal cancer Neg Hx   . Rectal cancer Neg Hx   . Stomach cancer Neg Hx     Social History Social History   Tobacco Use  . Smoking status: Never Smoker  . Smokeless tobacco: Never Used  Substance Use Topics  . Alcohol use: No    Alcohol/week: 0.0 standard drinks  . Drug use: No  lives with spouse   Allergies   Alendronate sodium, Lexapro [escitalopram oxalate], Amoxicillin, Aspirin, Budesonide, Cephalexin, Clindamycin/lincomycin, Keflex [cephalexin], Levofloxacin, Prednisone, Sudafed [pseudoephedrine hcl], Sulfa antibiotics, Toprol xl [metoprolol succinate], and Trimethoprim   Review of Systems Review of Systems  All other systems reviewed and are negative.    Physical Exam Updated Vital Signs BP (!) 164/58   Pulse 71   Temp 98.7 F (37.1 C) (Oral)   Resp 18   Ht 5\' 1"  (1.549 m)   Wt 58.1 kg   SpO2 96%   BMI 24.19 kg/m   Vital signs normal    Physical Exam Vitals signs and nursing note reviewed.  Constitutional:      Appearance: Normal appearance. She is normal weight.  HENT:     Head: Normocephalic and atraumatic.     Right Ear: External ear normal.     Left Ear: External ear normal.  Eyes:     Extraocular Movements: Extraocular movements intact.     Conjunctiva/sclera: Conjunctivae normal.     Pupils: Pupils are equal, round, and reactive to light.  Neck:     Musculoskeletal: Normal range of motion.  Cardiovascular:     Rate and Rhythm: Normal rate.  Pulmonary:     Effort: Pulmonary effort is normal. No respiratory distress.  Musculoskeletal:     Comments: Patient has mild swelling around her right ankle.  There is no warmth or inflammation of the skin around her ankle to suggest cellulitis.  She has some chronic changes and mottling that appears chronic.  Skin:    General: Skin is warm and dry.     Findings: No rash.  Neurological:      General: No focal deficit present.     Mental Status: She is alert and oriented to person, place, and time.     Cranial Nerves: No cranial nerve deficit.  Psychiatric:        Mood and Affect: Mood normal.        Behavior: Behavior normal.        Thought Content: Thought content normal.      ED Treatments / Results  Labs (all labs ordered are listed, but only abnormal results are displayed) Labs Reviewed - No data to display  EKG None  Radiology No results found.  Procedures Procedures (including critical care time)  Medications Ordered in ED Medications - No data to display   Initial Impression / Assessment and Plan / ED Course  I have reviewed the triage vital signs and the nursing notes.  Pertinent labs & imaging results that were available during my care of the patient were reviewed by me and considered in my medical decision making (see chart for details).    Patient presents stating she had a rash earlier however now there is no rash seen.  She admits she does not see the rash now either.  She was placed on antibiotic for uncertain reason.  I am not going to continue her on antibiotics due to her multiple allergies.  We discussed getting an x-ray of her ankle tonight and she did not  seem too eager to do that.  She states she used to go to [facility] which is now [facility] and she states if she continues to have pain in her ankle she would consider going back there.  She was given their number again.  Final Clinical Impressions(s) / ED Diagnoses   Final diagnoses:  Rash  Allergic reaction, initial encounter    ED Discharge Orders    None     Plan discharge  Rolland Porter, MD, Barbette Or, MD 11/18/18 [phone]

## 2018-11-18 NOTE — Discharge Instructions (Signed)
Stop the amoxicillin, you must have an allergy now to even the low-dose amoxicillin.  If you continue to have pain in your right ankle you can be evaluated by the orthopedist you have seen in the past, [facility] which is now [facility].  Please contact your doctor's office in the morning to let them know you had an allergic reaction to the amoxicillin.  I am not sure why they started you on antibiotic but they can decide if they need to continue with something.  You have multiple drug allergies.  Turn to the emergency department if you have uncontrolled itching, have difficulty swallowing or breathing.

## 2018-11-18 NOTE — ED Triage Notes (Signed)
Pt took first dose of amoxicillin Thursday am, this am she noticed some redness and rash all over her body.  Pt denies pain or itching.

## 2018-11-21 ENCOUNTER — Other Ambulatory Visit: Payer: Self-pay

## 2018-11-21 ENCOUNTER — Encounter: Payer: Self-pay | Admitting: Family Medicine

## 2018-11-21 ENCOUNTER — Ambulatory Visit (INDEPENDENT_AMBULATORY_CARE_PROVIDER_SITE_OTHER): Payer: Medicare Other | Admitting: Family Medicine

## 2018-11-21 DIAGNOSIS — J988 Other specified respiratory disorders: Secondary | ICD-10-CM | POA: Diagnosis not present

## 2018-11-21 DIAGNOSIS — B9689 Other specified bacterial agents as the cause of diseases classified elsewhere: Secondary | ICD-10-CM

## 2018-11-21 MED ORDER — AZITHROMYCIN 250 MG PO TABS: ORAL_TABLET | ORAL | 0 refills | Status: DC

## 2018-11-21 NOTE — Patient Instructions (Signed)
Upper Respiratory Infection, Adult An upper respiratory infection (URI) affects the nose, throat, and upper air passages. URIs are caused by germs (viruses). The most common type of URI is often called "the common cold." Medicines cannot cure URIs, but you can do things at home to relieve your symptoms. URIs usually get better within 7-10 days. Follow these instructions at home: Activity  Rest as needed.  If you have a fever, stay home from work or school until your fever is gone, or until your doctor says you may return to work or school. ? You should stay home until you cannot spread the infection anymore (you are not contagious). ? Your doctor may have you wear a face mask so you have less risk of spreading the infection. Relieving symptoms  Gargle with a salt-water mixture 3-4 times a day or as needed. To make a salt-water mixture, completely dissolve -1 tsp of salt in 1 cup of warm water.  Use a cool-mist humidifier to add moisture to the air. This can help you breathe more easily. Eating and drinking   Drink enough fluid to keep your pee (urine) pale yellow.  Eat soups and other clear broths. General instructions   Take over-the-counter and prescription medicines only as told by your doctor. These include cold medicines, fever reducers, and cough suppressants.  Do not use any products that contain nicotine or tobacco. These include cigarettes and e-cigarettes. If you need help quitting, ask your doctor.  Avoid being where people are smoking (avoid secondhand smoke).  Make sure you get regular shots and get the flu shot every year.  Keep all follow-up visits as told by your doctor. This is important. How to avoid spreading infection to others   Wash your hands often with soap and water. If you do not have soap and water, use hand sanitizer.  Avoid touching your mouth, face, eyes, or nose.  Cough or sneeze into a tissue or your sleeve or elbow. Do not cough or sneeze  into your hand or into the air. Contact a doctor if:  You are getting worse, not better.  You have any of these: ? A fever. ? Chills. ? Brown or red mucus in your nose. ? Yellow or brown fluid (discharge)coming from your nose. ? Pain in your face, especially when you bend forward. ? Swollen neck glands. ? Pain with swallowing. ? White areas in the back of your throat. Get help right away if:  You have shortness of breath that gets worse.  You have very bad or constant: ? Headache. ? Ear pain. ? Pain in your forehead, behind your eyes, and over your cheekbones (sinus pain). ? Chest pain.  You have long-lasting (chronic) lung disease along with any of these: ? Wheezing. ? Long-lasting cough. ? Coughing up blood. ? A change in your usual mucus.  You have a stiff neck.  You have changes in your: ? Vision. ? Hearing. ? Thinking. ? Mood. Summary  An upper respiratory infection (URI) is caused by a germ called a virus. The most common type of URI is often called "the common cold."  URIs usually get better within 7-10 days.  Take over-the-counter and prescription medicines only as told by your doctor. This information is not intended to replace advice given to you by your health care provider. Make sure you discuss any questions you have with your health care provider. Document Released: 10/14/2007 Document Revised: 05/05/2018 Document Reviewed: 12/18/2016 Elsevier Patient Education  2020 [employer].

## 2018-11-21 NOTE — Progress Notes (Signed)
Virtual Visit via Telephone Note  I connected with Rachel Walton on 11/21/18 at 10:11 AM by telephone and verified that I am speaking with the correct person using two identifiers. Rachel Walton is currently located at home and nobody is currently with her during this visit. The provider, Loman Brooklyn, FNP is located in their home at time of visit.  I discussed the limitations, risks, security and privacy concerns of performing an evaluation and management service by telephone and the availability of in person appointments. I also discussed with the patient that there may be a patient responsible charge related to this service. The patient expressed understanding and agreed to proceed.  Subjective: PCP: Janora Norlander, DO  Chief Complaint  Patient presents with  . URI   Patient complains of cough, head congestion, headache, sneezing, ear pain/pressure and postnasal drainage. Also reports feeling weak and being hoarse. Cough is productive; she is unsure of the color of the sputum. She denies chest congestion, runny nose, sore throat, facial pain/pressure, fever, shortness of breath, wheezing, nausea, vomiting, abdominal pain, diarrhea, loss of taste, loss of smell and swollen lymph nodes. Onset of symptoms was >1 week ago, gradually worsening since that time. Evaluation to date: patient had a visit last week and was prescribed Amoxicillin but had an allergic reaction to it. She went to the ER and was told to quit taking it. No treatment was provided in its place. Treatment to date: Tylenol, antihistamines and nasal steroids. She does not have a history of COPD or asthma; she does have seasonal allergies. She does not smoke. Patient has not had recent close contact with someone who has tested positive for COVID-19. She and her husband have been isolating at home and social distancing if they have to go out for necessities.    ROS: Per HPI  Allergies  Allergen Reactions  . Alendronate  Sodium Other (See Comments)    Caused chest pain  . Lexapro [Escitalopram Oxalate] Other (See Comments)    Makes lymphangitic colitis worse  . Amoxicillin   . Aspirin Other (See Comments)    Per the patient, made the gastric ulcer worse.  . Budesonide Other (See Comments)    Blurry vision, shaking inside her body  . Cephalexin   . Clindamycin/Lincomycin   . Keflex [Cephalexin]   . Levofloxacin Other (See Comments)    Insomnia and headache  . Prednisone Other (See Comments)    Reaction is unknown..then states nervousness  . Sudafed [Pseudoephedrine Hcl] Other (See Comments)    insomnia  . Sulfa Antibiotics Hives  . Toprol Xl [Metoprolol Succinate] Other (See Comments)    Unknown reaction  . Trimethoprim     Unknown reaction   Past Medical History:  Diagnosis Date  . Acute gastric ulcer   . Allergy   . Anxiety   . Atrophic vaginitis   . Barrett's esophagus   . Basal cell carcinoma of nose     under nose  . Cataract   . Depression   . Diverticulosis   . Esophageal stricture   . Esophageal ulcer   . Fatty liver   . GERD (gastroesophageal reflux disease)   . Heart murmur   . Hiatal hernia   . Hx of adenomatous colonic polyps   . Hyperlipidemia   . Hypertension   . Hypokalemia   . IBS (irritable bowel syndrome)   . Kyphosis   . Lymphocytic colitis   . Macular degeneration    "?wet or  dry; ? both eyes"  . 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 Osteopenia of the elderly   . Pneumonia X 1  . Postmenopausal   . Recurrent UTI   . 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 Tremor   . Vitamin D deficiency     Current Outpatient Medications:  .  amLODipine (NORVASC) 2.5 MG tablet, , Disp: , Rfl:  .  azithromycin (ZITHROMAX) 250 MG tablet, As directed, Disp: 6 tablet, Rfl: 0 .  Calcium Carbonate-Vitamin D (CALCIUM 600+D) 600-200 MG-UNIT TABS, Take 1 tablet by mouth daily. Take with a meal (Patient taking differently: Take 1-2 tablets by mouth daily. Take  with a meal), Disp: , Rfl: 0 .  clonazePAM (KLONOPIN) 0.5 MG tablet, Take 0.5-1 tablets (0.25-0.5 mg total) by mouth 2 (two) times daily as needed for anxiety., Disp: 180 tablet, Rfl: 0 .  fexofenadine (ALLEGRA) 180 MG tablet, Take 180 mg by mouth daily., Disp: , Rfl:  .  fluticasone (FLONASE) 50 MCG/ACT nasal spray, Place 2 sprays into both nostrils daily., Disp: 16 g, Rfl: 6 .  lisinopril (ZESTRIL) 20 MG tablet, Take 1 tablet (20 mg total) by mouth daily., Disp: 90 tablet, Rfl: 3 .  mesalamine (LIALDA) 1.2 g EC tablet, Take 2 tablets (2.4 g total) by mouth daily with breakfast. TAKE 2 TABLETS EVERY MORNING WITH BREAKFAST (Patient taking differently: Take 2.4 g by mouth daily. TAKE 2 TABLETS EVERY Night), Disp: 60 tablet, Rfl: 0 .  Multiple Vitamins-Minerals (PRESERVISION AREDS 2) CHEW, Chew 2 each by mouth daily., Disp: , Rfl:  .  OVER THE COUNTER MEDICATION, Stress tab energy- one a day, Disp: , Rfl:  .  potassium chloride (K-DUR) 10 MEQ tablet, TAKE ONE (1) TABLET EACH DAY, Disp: 90 tablet, Rfl: 1 .  Wheat Dextrin (BENEFIBER) POWD, Take 1 Dose by mouth daily as needed. , Disp: , Rfl:    Observations/Objective: A&O  No respiratory distress or wheezing audible over the phone Mood, judgement, and thought processes all WNL  Assessment and Plan: 1. Bacterial respiratory infection - Education provided on respiratory infections. Encouraged her to continue Allegra and Flonase daily. She does not wish to proceed with COVID testing at this time. She would like to try the antibiotic and if her symptoms do not improve she will call back to have test ordered.  - azithromycin (ZITHROMAX) 250 MG tablet; As directed  Dispense: 6 tablet; Refill: 0   Follow Up Instructions:  I discussed the assessment and treatment plan with the patient. The patient was provided an opportunity to ask questions and all were answered. The patient agreed with the plan and demonstrated an understanding of the instructions.    The patient was advised to call back or seek an in-person evaluation if the symptoms worsen or if the condition fails to improve as anticipated.  The above assessment and management plan was discussed with the patient. The patient verbalized understanding of and has agreed to the management plan. Patient is aware to call the clinic if symptoms persist or worsen. Patient is aware when to return to the clinic for a follow-up visit. Patient educated on when it is appropriate to go to the emergency department.   Time call ended: 10:31 AM  I provided 20 minutes of non-face-to-face time during this encounter.  Hendricks Limes, MSN, APRN, FNP-C [facility] 11/21/18

## 2018-12-01 ENCOUNTER — Ambulatory Visit: Payer: Medicare Other | Admitting: Neurology

## 2018-12-10 ENCOUNTER — Other Ambulatory Visit: Payer: Self-pay | Admitting: Family Medicine

## 2018-12-10 DIAGNOSIS — F411 Generalized anxiety disorder: Secondary | ICD-10-CM

## 2018-12-10 DIAGNOSIS — R251 Tremor, unspecified: Secondary | ICD-10-CM

## 2018-12-15 ENCOUNTER — Encounter: Payer: Self-pay | Admitting: Internal Medicine

## 2018-12-15 ENCOUNTER — Ambulatory Visit: Payer: Medicare Other | Admitting: Internal Medicine

## 2018-12-15 VITALS — BP 160/70 | HR 76 | Temp 98.3°F | Wt 127.0 lb

## 2018-12-15 DIAGNOSIS — G250 Essential tremor: Secondary | ICD-10-CM | POA: Diagnosis not present

## 2018-12-15 DIAGNOSIS — R202 Paresthesia of skin: Secondary | ICD-10-CM

## 2018-12-15 DIAGNOSIS — E785 Hyperlipidemia, unspecified: Secondary | ICD-10-CM

## 2018-12-15 DIAGNOSIS — E876 Hypokalemia: Secondary | ICD-10-CM | POA: Diagnosis not present

## 2018-12-15 DIAGNOSIS — I10 Essential (primary) hypertension: Secondary | ICD-10-CM

## 2018-12-15 DIAGNOSIS — K529 Noninfective gastroenteritis and colitis, unspecified: Secondary | ICD-10-CM

## 2018-12-15 LAB — TSH: TSH: 1.29 u[IU]/mL (ref 0.35–4.50)

## 2018-12-15 LAB — BASIC METABOLIC PANEL
BUN: 6 mg/dL (ref 6–23)
CO2: 32 mEq/L (ref 19–32)
Calcium: 8.9 mg/dL (ref 8.4–10.5)
Chloride: 104 mEq/L (ref 96–112)
Creatinine, Ser: 0.72 mg/dL (ref 0.40–1.20)
GFR: 76.63 mL/min (ref >60.00)
Glucose, Bld: 89 mg/dL (ref 70–99)
Potassium: 3.5 mEq/L (ref 3.5–5.1)
Sodium: 144 mEq/L (ref 135–145)

## 2018-12-15 LAB — VITAMIN B12: Vitamin B-12: 386 pg/mL (ref 211–911)

## 2018-12-15 LAB — MAGNESIUM: Magnesium: 2.2 mg/dL (ref 1.5–2.5)

## 2018-12-15 NOTE — Progress Notes (Signed)
Established Patient Office Visit     CC/Reason for Visit: Establish care, follow-up chronic medical conditions and discuss some acute conditions.  HPI: Rachel Walton is a 85 y.o. female who is coming in today for the above mentioned reasons. Past Medical History is significant for: Hypertension that per patient has been well controlled recently, GERD and Barrett's esophagus currently not on PPI therapy followed by Dr. Hilarie Fredrickson, inflammatory bowel disease on mesalamine also followed by Dr. Hilarie Fredrickson, she has benign essential tremor on Klonopin used to see neurology through [facility] but not anymore, she has a history of hyperlipidemia but is not on any medication secondary to severe intolerance to statins with myalgias, also past history of vitamin D deficiency.  This past spring and summer have been quite difficult for her in regards to her allergies and sinuses, she frequently has stuffy ears and headaches.  She is on Flonase, antihistamines.  She is complaining lately that the tips of her fingers and her toes have been tingling.  She was recently seen at [facility] for a rash and was found to be hypokalemic with a potassium of 2 was placed on daily potassium but has had no follow-up levels since.   Past Medical/Surgical History: Past Medical History:  Diagnosis Date  . Acute gastric ulcer   . Allergy   . Anxiety   . Atrophic vaginitis   . Barrett's esophagus   . Basal cell carcinoma of nose     under nose  . Cataract   . Depression   . Diverticulosis   . Esophageal stricture   . Esophageal ulcer   . Fatty liver   . GERD (gastroesophageal reflux disease)   . Heart murmur   . Hiatal hernia   . Hx of adenomatous colonic polyps   . Hyperlipidemia   . Hypertension   . Hypokalemia   . IBS (irritable bowel syndrome)   . Kyphosis   . Lymphocytic colitis   . Macular degeneration    "?wet or dry; ? both eyes"  . 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  Osteopenia of the elderly   . Pneumonia X 1  . Postmenopausal   . Recurrent UTI   . 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 Tremor   . Vitamin D deficiency     Past Surgical History:  Procedure Laterality Date  . ABDOMINAL HYSTERECTOMY  1980  . APPENDECTOMY    . BACK SURGERY    . BREAST BIOPSY Left   . CATARACT EXTRACTION W/ INTRAOCULAR LENS  IMPLANT, BILATERAL Bilateral   . COLONOSCOPY    . ESOPHAGOGASTRODUODENOSCOPY (EGD) WITH ESOPHAGEAL DILATION  X 1  . EYE SURGERY    . LUMBAR DISC SURGERY     "fragmented disc"  . POLYPECTOMY    . SQUAMOUS CELL CARCINOMA EXCISION     nose tip    Social History:  reports that she has never smoked. She has never used smokeless tobacco. She reports that she does not drink alcohol or use drugs.  Allergies: Allergies  Allergen Reactions  . Alendronate Sodium Other (See Comments)    Caused chest pain  . Lexapro [Escitalopram Oxalate] Other (See Comments)    Makes lymphangitic colitis worse  . Amoxicillin   . Aspirin Other (See Comments)    Per the patient, made the gastric ulcer worse.  . Budesonide Other (See Comments)    Blurry vision, shaking inside her body  . Cephalexin   .  Clindamycin/Lincomycin   . Keflex [Cephalexin]   . Levofloxacin Other (See Comments)    Insomnia and headache  . Prednisone Other (See Comments)    Reaction is unknown..then states nervousness  . Sudafed [Pseudoephedrine Hcl] Other (See Comments)    insomnia  . Sulfa Antibiotics Hives  . Toprol Xl [Metoprolol Succinate] Other (See Comments)    Unknown reaction  . Trimethoprim     Unknown reaction    Family History:  Family History  Problem Relation Age of Onset  . Stroke Mother   . Heart disease Father        MI  . Heart attack Father   . Cancer Sister        Brain tumor  . Hypertension Sister   . Osteoporosis Sister   . Colon cancer Brother   . Heart disease Brother   . Diabetes Son 8  . Diabetes Maternal Uncle   . Diabetes Son 17  .  Hypertension Sister   . Breast cancer Sister 48  . Hypertension Sister   . Cancer Sister        breast  . Osteoporosis Sister   . Hypertension Sister   . Hypertension Sister   . Stroke Sister   . Hypertension Sister   . Hypertension Sister   . Colon cancer Other        nephew  . Brain cancer Son        deceased  . Cancer Son        brain  . Thyroid cancer Other        niece  . Breast cancer Other   . Breast cancer Other   . Breast cancer Other   . Colon polyps Neg Hx   . Esophageal cancer Neg Hx   . Rectal cancer Neg Hx   . Stomach cancer Neg Hx      Current Outpatient Medications:  .  amLODipine (NORVASC) 2.5 MG tablet, , Disp: , Rfl:  .  azithromycin (ZITHROMAX) 250 MG tablet, As directed, Disp: 6 tablet, Rfl: 0 .  Calcium Carbonate-Vitamin D (CALCIUM 600+D) 600-200 MG-UNIT TABS, Take 1 tablet by mouth daily. Take with a meal (Patient taking differently: Take 1-2 tablets by mouth daily. Take with a meal), Disp: , Rfl: 0 .  clonazePAM (KLONOPIN) 0.5 MG tablet, Take 0.5-1 tablets (0.25-0.5 mg total) by mouth 2 (two) times daily as needed for anxiety., Disp: 180 tablet, Rfl: 0 .  fexofenadine (ALLEGRA) 180 MG tablet, Take 180 mg by mouth daily., Disp: , Rfl:  .  fluticasone (FLONASE) 50 MCG/ACT nasal spray, Place 2 sprays into both nostrils daily., Disp: 16 g, Rfl: 6 .  lisinopril (ZESTRIL) 20 MG tablet, Take 1 tablet (20 mg total) by mouth daily., Disp: 90 tablet, Rfl: 3 .  mesalamine (LIALDA) 1.2 g EC tablet, Take 2 tablets (2.4 g total) by mouth daily with breakfast. TAKE 2 TABLETS EVERY MORNING WITH BREAKFAST (Patient taking differently: Take 2.4 g by mouth daily. TAKE 2 TABLETS EVERY Night), Disp: 60 tablet, Rfl: 0 .  Multiple Vitamins-Minerals (PRESERVISION AREDS 2) CHEW, Chew 2 each by mouth daily., Disp: , Rfl:  .  OVER THE COUNTER MEDICATION, Stress tab energy- one a day, Disp: , Rfl:  .  potassium chloride (K-DUR) 10 MEQ tablet, TAKE ONE (1) TABLET EACH DAY, Disp: 90  tablet, Rfl: 1 .  Wheat Dextrin (BENEFIBER) POWD, Take 1 Dose by mouth daily as needed. , Disp: , Rfl:   Review of Systems:  Constitutional: Denies  fever, chills, diaphoresis, appetite change and fatigue.  HEENT: Denies photophobia, eye pain, redness, hearing loss, ear pain, congestion, sore throat, rhinorrhea, sneezing, mouth sores, trouble swallowing, neck pain, neck stiffness and tinnitus.   Respiratory: Denies SOB, DOE, cough, chest tightness,  and wheezing.   Cardiovascular: Denies chest pain, palpitations and leg swelling.  Gastrointestinal: Denies nausea, vomiting, abdominal pain, diarrhea, constipation, blood in stool and abdominal distention.  Genitourinary: Denies dysuria, urgency, frequency, hematuria, flank pain and difficulty urinating.  Endocrine: Denies: hot or cold intolerance, sweats, changes in hair or nails, polyuria, polydipsia. Musculoskeletal: Denies myalgias, back pain, joint swelling, arthralgias and gait problem.  Skin: Denies pallor, rash and wound.  Neurological: Denies dizziness, seizures, syncope, weakness, light-headedness and headaches.  Hematological: Denies adenopathy. Easy bruising, personal or family bleeding history  Psychiatric/Behavioral: Denies suicidal ideation, mood changes, confusion, nervousness, sleep disturbance and agitation    Physical Exam: Vitals:   12/15/18 1327  BP: (!) 160/70  Pulse: 76  Temp: 98.3 F (36.8 C)  TempSrc: Temporal  SpO2: 97%  Weight: 127 lb (57.6 kg)    Body mass index is 24 kg/m.   Constitutional: NAD, calm, comfortable, hard of hearing Eyes: PERRL, lids and conjunctivae normal, wears corrective lenses ENMT: Mucous membranes are moist.  Respiratory: clear to auscultation bilaterally, no wheezing, no crackles. Normal respiratory effort. No accessory muscle use.  Cardiovascular: Regular rate and rhythm, no murmurs / rubs / gallops. No extremity edema. 2+ pedal pulses. No carotid bruits.  Abdomen: no tenderness,  no masses palpated. No hepatosplenomegaly. Bowel sounds positive.  Musculoskeletal: no clubbing / cyanosis. No joint deformity upper and lower extremities. Good ROM, no contractures. Normal muscle tone.  Skin: no rashes, lesions, ulcers. No induration Neurologic: Grossly intact and nonfocal Psychiatric: Normal judgment and insight. Alert and oriented x 3. Normal mood.    Impression and Plan:  Essential hypertension -BP is elevated in office today to 160/70, however she takes it routinely at home and states that it is usually around 130/60-70.  She states she was a little bit anxious today finding her way to the new office and meeting a new provider. -Advised continued ambulatory blood pressure monitoring and will follow-up at next in office visit.  Hypokalemia -This was diagnosed at [facility] mid July, she was placed on supplementation but has not had follow-up levels. -Check basic metabolic profile and magnesium today.  Essential tremor -Continue clonazepam.  Hyperlipidemia, unspecified hyperlipidemia type -Last LDL was 98 in December 2019, she does not tolerate statin therapy due to severe myalgias.  IBD (inflammatory bowel disease) -On mesalamine, follows with GI.  Tingling in extremities  -Feel we should rule out vitamin B12 deficiency and hypothyroidism.    Patient Instructions  -Nice meeting you today!!  -Lab work today; will notify you once results are available.  -Please schedule your physical on or after December; come in fasting that day.     Lelon Frohlich, MD [facility]

## 2018-12-15 NOTE — Addendum Note (Signed)
Addended by: Diona Foley on: 12/15/2018 02:36 PM   Modules accepted: Orders

## 2018-12-15 NOTE — Patient Instructions (Signed)
-  Nice meeting you today!!  -Lab work today; will notify you once results are available.  -Please schedule your physical on or after December; come in fasting that day.

## 2019-01-11 ENCOUNTER — Ambulatory Visit: Payer: Medicare Other | Admitting: Neurology

## 2019-01-17 ENCOUNTER — Ambulatory Visit: Payer: Medicare Other | Admitting: Internal Medicine

## 2019-02-03 ENCOUNTER — Telehealth: Payer: Self-pay | Admitting: Internal Medicine

## 2019-02-03 NOTE — Telephone Encounter (Signed)
Called patient back, she started having bleeding from her Hemorrhoids last night. She is not constipated, she has diarrhea. She has been using some Prep H. I suggested she also use Tucks and so sitz baths 3-4 times a day. And call us back the first of the week if no better

## 2019-02-28 ENCOUNTER — Other Ambulatory Visit: Payer: Self-pay | Admitting: Internal Medicine

## 2019-03-07 ENCOUNTER — Encounter: Payer: Self-pay | Admitting: Internal Medicine

## 2019-03-07 ENCOUNTER — Ambulatory Visit (INDEPENDENT_AMBULATORY_CARE_PROVIDER_SITE_OTHER): Payer: Medicare Other | Admitting: Internal Medicine

## 2019-03-07 ENCOUNTER — Encounter

## 2019-03-07 ENCOUNTER — Other Ambulatory Visit (INDEPENDENT_AMBULATORY_CARE_PROVIDER_SITE_OTHER): Payer: Medicare Other

## 2019-03-07 VITALS — BP 124/70 | HR 74 | Temp 98.2°F | Ht 61.0 in | Wt 125.0 lb

## 2019-03-07 DIAGNOSIS — K649 Unspecified hemorrhoids: Secondary | ICD-10-CM | POA: Diagnosis not present

## 2019-03-07 DIAGNOSIS — R109 Unspecified abdominal pain: Secondary | ICD-10-CM | POA: Diagnosis not present

## 2019-03-07 DIAGNOSIS — K52839 Microscopic colitis, unspecified: Secondary | ICD-10-CM | POA: Diagnosis not present

## 2019-03-07 DIAGNOSIS — K52832 Lymphocytic colitis: Secondary | ICD-10-CM

## 2019-03-07 DIAGNOSIS — R5383 Other fatigue: Secondary | ICD-10-CM

## 2019-03-07 LAB — URINALYSIS, ROUTINE W REFLEX MICROSCOPIC
Bilirubin Urine: NEGATIVE
Hgb urine dipstick: NEGATIVE
Ketones, ur: NEGATIVE
Nitrite: NEGATIVE
Specific Gravity, Urine: 1.01 (ref 1.000–1.030)
Total Protein, Urine: NEGATIVE
Urine Glucose: NEGATIVE
Urobilinogen, UA: 0.2 (ref 0.0–1.0)
pH: 5 (ref 5.0–8.0)

## 2019-03-07 LAB — COMPREHENSIVE METABOLIC PANEL
ALT: 15 U/L (ref 0–35)
AST: 21 U/L (ref 0–37)
Albumin: 3.9 g/dL (ref 3.5–5.2)
Alkaline Phosphatase: 58 U/L (ref 39–117)
BUN: 8 mg/dL (ref 6–23)
CO2: 33 mEq/L — ABNORMAL HIGH (ref 19–32)
Calcium: 9.1 mg/dL (ref 8.4–10.5)
Chloride: 104 mEq/L (ref 96–112)
Creatinine, Ser: 0.76 mg/dL (ref 0.40–1.20)
GFR: 71.95 mL/min (ref >60.00)
Glucose, Bld: 96 mg/dL (ref 70–99)
Potassium: 3.5 mEq/L (ref 3.5–5.1)
Sodium: 141 mEq/L (ref 135–145)
Total Bilirubin: 0.5 mg/dL (ref 0.2–1.2)
Total Protein: 6.4 g/dL (ref 6.0–8.3)

## 2019-03-07 LAB — CBC WITH DIFFERENTIAL/PLATELET
Basophils Absolute: 0.1 10*3/uL (ref 0.0–0.1)
Basophils Relative: 1.5 % (ref 0.0–3.0)
Eosinophils Absolute: 0.1 10*3/uL (ref 0.0–0.7)
Eosinophils Relative: 2.2 % (ref 0.0–5.0)
HCT: 38.5 % (ref 36.0–46.0)
Hemoglobin: 12.9 g/dL (ref 12.0–15.0)
Lymphocytes Relative: 47 % — ABNORMAL HIGH (ref 12.0–46.0)
Lymphs Abs: 3 10*3/uL (ref 0.7–4.0)
MCHC: 33.5 g/dL (ref 30.0–36.0)
MCV: 87.1 fl (ref 78.0–100.0)
Monocytes Absolute: 0.5 10*3/uL (ref 0.1–1.0)
Monocytes Relative: 7.3 % (ref 3.0–12.0)
Neutro Abs: 2.6 10*3/uL (ref 1.4–7.7)
Neutrophils Relative %: 42 % — ABNORMAL LOW (ref 43.0–77.0)
Platelets: 230 10*3/uL (ref 150.0–400.0)
RBC: 4.42 Mil/uL (ref 3.87–5.11)
RDW: 13.5 % (ref 11.5–15.5)
WBC: 6.3 10*3/uL (ref 4.0–10.5)

## 2019-03-07 LAB — IBC + FERRITIN
Iron: 85 ug/dL (ref 42–145)
Saturation Ratios: 26.4 % (ref 20.0–50.0)
Transferrin: 230 mg/dL (ref 212.0–360.0)

## 2019-03-07 MED ORDER — BUDESONIDE 3 MG PO CPEP: 3.0000 mg | ORAL_CAPSULE | Freq: Every day | ORAL | 3 refills | Status: DC

## 2019-03-07 MED ORDER — HYDROCORTISONE (PERIANAL) 2.5 % EX CREA: 1.0000 "application " | TOPICAL_CREAM | Freq: Two times a day (BID) | CUTANEOUS | 0 refills | Status: DC

## 2019-03-07 MED ORDER — MESALAMINE 1.2 G PO TBEC: 2.4000 g | DELAYED_RELEASE_TABLET | Freq: Every day | ORAL | 3 refills | Status: DC

## 2019-03-07 NOTE — Patient Instructions (Addendum)
Your provider has requested that you go to the basement level for lab work before leaving today. Press "B" on the elevator. The lab is located at the first door on the left as you exit the elevator.  Continue Lialda 2.4 grams daily. We have sent refills to your pharmacy.  We have sent the following medications to your pharmacy for you to pick up at your convenience: Entocort 3 mg daily Hydrocortisone cream twice daily to rectum as needed for hemorrhoids  Please follow up with Dr Hilarie Fredrickson on 04/25/2019 at 9:10 am.  If you are age 78 or older, your body mass index should be between 23-30. Your Body mass index is 23.62 kg/m. If this is out of the aforementioned range listed, please consider follow up with your Primary Care Provider.  If you are age 71 or younger, your body mass index should be between 19-25. Your Body mass index is 23.62 kg/m. If this is out of the aformentioned range listed, please consider follow up with your Primary Care Provider.

## 2019-03-07 NOTE — Progress Notes (Signed)
Subjective:    Patient ID: Rachel Walton, female    DOB: July 28, 1929, 85 y.o.   MRN: [id number]  HPI Rachel Walton is an 85 year old female with a history of lymphocytic colitis, longstanding irritable bowel syndrome, colonic diverticulosis, remote colon polyps, history of reflux and peptic ulcer disease who is here for follow-up.  She was last seen in January 2020.  She is here alone today.  She reports recently she has had a recurrent issue with diarrhea.  This has been quite bothersome recently.  She is having small but frequent stools.  Stools are also urgent and make her lower back hurt.  Stools can occur multiple times per day and even at night.  Several times in the last few months she has had a flare of hemorrhoids related to this diarrhea.  She has painful swollen hemorrhoids and on several occasions had rectal bleeding.  She is used warm baths and Tucks medicated pads with good relief of symptoms.  She has continued Lialda 2.4 g daily.  She has some tightness in her lower abdomen which she says is a frequent symptom of urinary tract infection.  She denies dysuria.  She has allergies to multiple antibiotics and reports she can only really take Cipro when she has a UTI.  She still has nervousness and is taking 0.252.5 clonazepam usually once a day sometimes twice a day.  She has been taking this for over 30 years.  She denies any reflux symptoms today.   Review of Systems As per HPI, otherwise negative  Current Medications, Allergies, Past Medical History, Past Surgical History, Family History and Social History were reviewed in [employer] record.     Objective:   Physical Exam BP 124/70   Pulse 74   Temp 98.2 F (36.8 C)   Ht 5\' 1"  (1.549 m)   Wt 125 lb (56.7 kg)   BMI 23.62 kg/m  Gen: awake, alert, NAD HEENT: anicteric CV: RRR, no mrg Pulm: CTA b/l Abd: soft, NT/ND, +BS throughout Ext: no c/c/e Neuro: nonfocal  CBC    Component Value Date/Time   WBC 6.1 05/31/2018 1447   RBC 4.50 05/31/2018 1447   HGB 13.1 05/31/2018 1447   HGB 14.8 04/27/2018 1837   HCT 39.3 05/31/2018 1447   HCT 45.2 04/27/2018 1837   PLT 283.0 05/31/2018 1447   PLT 300 04/27/2018 1837   MCV 87.3 05/31/2018 1447   MCV 87 04/27/2018 1837   MCH 28.6 04/27/2018 1837   MCH 28.9 04/13/2018 1449   MCHC 33.5 05/31/2018 1447   RDW 14.3 05/31/2018 1447   RDW 13.1 04/27/2018 1837   LYMPHSABS 2.9 05/31/2018 1447   LYMPHSABS 3.0 04/27/2018 1837   MONOABS 0.6 05/31/2018 1447   EOSABS 0.2 05/31/2018 1447   EOSABS 0.2 04/27/2018 1837   BASOSABS 0.0 05/31/2018 1447   BASOSABS 0.1 04/27/2018 1837   CMP     Component Value Date/Time   NA 144 12/15/2018 1436   NA 146 (H) 11/09/2018 1411   K 3.5 12/15/2018 1436   CL 104 12/15/2018 1436   CO2 32 12/15/2018 1436   GLUCOSE 89 12/15/2018 1436   BUN 6 12/15/2018 1436   BUN 6 (L) 11/09/2018 1411   CREATININE 0.72 12/15/2018 1436   CREATININE 0.81 12/20/2015 1520   CALCIUM 8.9 12/15/2018 1436   PROT 6.8 04/27/2018 1837   ALBUMIN 4.4 04/27/2018 1837   AST 21 04/27/2018 1837   ALT 25 04/27/2018 1837   ALKPHOS 64 04/27/2018  1837   BILITOT 0.4 04/27/2018 1837   GFRNONAA 79 11/09/2018 1411   GFRNONAA 61 11/21/2012 1632   GFRAA 91 11/09/2018 1411   GFRAA 70 11/21/2012 1632       Assessment & Plan:   85 year old female with a history of lymphocytic colitis, longstanding irritable bowel syndrome, colonic diverticulosis, remote colon polyps, history of reflux and peptic ulcer disease who is here for follow-up.  1.  Diarrhea/lymphocytic colitis --diarrhea very likely related to a flare of lymphocytic colitis.  Previously this became controlled with the budesonide therapy and we have had her on long-term mesalamine which previously had been effective.  I am going to try low-dose budesonide as in the past she had some mild nervousness and blurry vision though I am not convinced this was budesonide related. --Budesonide 3 mg  daily --Lialda 2.4 g daily --6-week follow-up though she should call me if symptoms fail to improve before follow-up  2.  Symptomatic and intermittently bleeding internal hemorrhoids --getting her diarrhea under control as a #1 should improve the symptoms.  Will give her some Proctosol cream to use for several days as needed.  Can continue sitz bath as needed.  Tucks medicated pads with bowel movement.  3.  Lower abd pressure/hx of UTI --check urinalysis with culture today  4.  Fatigue --ongoing complaint for her.  Exacerbated by anxiousness.  She has been on clonazepam once to twice daily for many years.  This likely should continue given her chronic use.  I am going to check labs today to include CBC, CMP, iron studies and B12.

## 2019-03-08 ENCOUNTER — Telehealth: Payer: Self-pay | Admitting: Internal Medicine

## 2019-03-08 NOTE — Telephone Encounter (Signed)
pt states she thinks budesonide is going to make her have blurry vision and shaking in her body, because that's how she felt last time she took it and is feeling now.  States if doctor thinks taking Budesonide and Mesalamine together will help the blurred vision and the shakes she will try.  If that's the case she will try and you don't have to call her back,  however if it does increase blurred vision and shakes she will stop taking and call us.

## 2019-03-08 NOTE — Telephone Encounter (Signed)
Per Dr. Vena Rua OV note from yesterday he does not feel that the budesonide caused her blurred vision and he wanted her to proceed with taking the budesonide and the mesalamine.

## 2019-03-09 LAB — URINE CULTURE
MICRO NUMBER:: 1034927
SPECIMEN QUALITY:: ADEQUATE

## 2019-03-09 LAB — VITAMIN B12: Vitamin B-12: 252 pg/mL (ref 211–911)

## 2019-04-18 ENCOUNTER — Encounter: Payer: Medicare Other | Admitting: Internal Medicine

## 2019-04-18 ENCOUNTER — Other Ambulatory Visit: Payer: Self-pay

## 2019-04-25 ENCOUNTER — Ambulatory Visit: Payer: Medicare Other | Admitting: Internal Medicine

## 2019-05-03 ENCOUNTER — Other Ambulatory Visit: Payer: Self-pay

## 2019-05-04 ENCOUNTER — Ambulatory Visit (INDEPENDENT_AMBULATORY_CARE_PROVIDER_SITE_OTHER): Payer: Medicare Other | Admitting: Internal Medicine

## 2019-05-04 ENCOUNTER — Encounter: Payer: Self-pay | Admitting: Internal Medicine

## 2019-05-04 ENCOUNTER — Telehealth: Payer: Self-pay | Admitting: Internal Medicine

## 2019-05-04 ENCOUNTER — Other Ambulatory Visit: Payer: Self-pay | Admitting: Family Medicine

## 2019-05-04 VITALS — BP 160/80 | HR 73 | Temp 97.6°F | Ht 61.0 in | Wt 124.0 lb

## 2019-05-04 DIAGNOSIS — H539 Unspecified visual disturbance: Secondary | ICD-10-CM

## 2019-05-04 DIAGNOSIS — F411 Generalized anxiety disorder: Secondary | ICD-10-CM

## 2019-05-04 DIAGNOSIS — Z0000 Encounter for general adult medical examination without abnormal findings: Secondary | ICD-10-CM

## 2019-05-04 DIAGNOSIS — K2270 Barrett's esophagus without dysplasia: Secondary | ICD-10-CM

## 2019-05-04 DIAGNOSIS — E785 Hyperlipidemia, unspecified: Secondary | ICD-10-CM

## 2019-05-04 DIAGNOSIS — I499 Cardiac arrhythmia, unspecified: Secondary | ICD-10-CM | POA: Diagnosis not present

## 2019-05-04 DIAGNOSIS — I10 Essential (primary) hypertension: Secondary | ICD-10-CM

## 2019-05-04 DIAGNOSIS — F3341 Major depressive disorder, recurrent, in partial remission: Secondary | ICD-10-CM

## 2019-05-04 DIAGNOSIS — G250 Essential tremor: Secondary | ICD-10-CM | POA: Diagnosis not present

## 2019-05-04 DIAGNOSIS — R531 Weakness: Secondary | ICD-10-CM

## 2019-05-04 DIAGNOSIS — R251 Tremor, unspecified: Secondary | ICD-10-CM

## 2019-05-04 DIAGNOSIS — K52832 Lymphocytic colitis: Secondary | ICD-10-CM

## 2019-05-04 LAB — COMPREHENSIVE METABOLIC PANEL
ALT: 13 U/L (ref 0–35)
AST: 19 U/L (ref 0–37)
Albumin: 4 g/dL (ref 3.5–5.2)
Alkaline Phosphatase: 52 U/L (ref 39–117)
BUN: 6 mg/dL (ref 6–23)
CO2: 34 mEq/L — ABNORMAL HIGH (ref 19–32)
Calcium: 9.1 mg/dL (ref 8.4–10.5)
Chloride: 103 mEq/L (ref 96–112)
Creatinine, Ser: 0.8 mg/dL (ref 0.40–1.20)
GFR: 67.79 mL/min (ref >60.00)
Glucose, Bld: 92 mg/dL (ref 70–99)
Potassium: 3.4 mEq/L — ABNORMAL LOW (ref 3.5–5.1)
Sodium: 144 mEq/L (ref 135–145)
Total Bilirubin: 0.6 mg/dL (ref 0.2–1.2)
Total Protein: 6.3 g/dL (ref 6.0–8.3)

## 2019-05-04 LAB — CBC WITH DIFFERENTIAL/PLATELET
Basophils Absolute: 0.1 10*3/uL (ref 0.0–0.1)
Basophils Relative: 1.5 % (ref 0.0–3.0)
Eosinophils Absolute: 0.1 10*3/uL (ref 0.0–0.7)
Eosinophils Relative: 2.4 % (ref 0.0–5.0)
HCT: 40.1 % (ref 36.0–46.0)
Hemoglobin: 13.2 g/dL (ref 12.0–15.0)
Lymphocytes Relative: 44.3 % (ref 12.0–46.0)
Lymphs Abs: 2.1 10*3/uL (ref 0.7–4.0)
MCHC: 33 g/dL (ref 30.0–36.0)
MCV: 87.7 fl (ref 78.0–100.0)
Monocytes Absolute: 0.4 10*3/uL (ref 0.1–1.0)
Monocytes Relative: 8.2 % (ref 3.0–12.0)
Neutro Abs: 2.1 10*3/uL (ref 1.4–7.7)
Neutrophils Relative %: 43.6 % (ref 43.0–77.0)
Platelets: 257 10*3/uL (ref 150.0–400.0)
RBC: 4.58 Mil/uL (ref 3.87–5.11)
RDW: 14 % (ref 11.5–15.5)
WBC: 4.8 10*3/uL (ref 4.0–10.5)

## 2019-05-04 LAB — VITAMIN D 25 HYDROXY (VIT D DEFICIENCY, FRACTURES): VITD: 39.9 ng/mL (ref 30.00–100.00)

## 2019-05-04 LAB — LIPID PANEL
Cholesterol: 299 mg/dL — ABNORMAL HIGH (ref 0–200)
HDL: 87.1 mg/dL (ref >39.00)
LDL Cholesterol: 178 mg/dL — ABNORMAL HIGH (ref 0–99)
NonHDL: 212
Total CHOL/HDL Ratio: 3
Triglycerides: 170 mg/dL — ABNORMAL HIGH (ref 0.0–149.0)
VLDL: 34 mg/dL (ref 0.0–40.0)

## 2019-05-04 LAB — HEMOGLOBIN A1C: Hgb A1c MFr Bld: 5.6 % (ref 4.6–6.5)

## 2019-05-04 LAB — TSH: TSH: 1.45 u[IU]/mL (ref 0.35–4.50)

## 2019-05-04 LAB — MAGNESIUM: Magnesium: 2.2 mg/dL (ref 1.5–2.5)

## 2019-05-04 LAB — VITAMIN B12: Vitamin B-12: 386 pg/mL (ref 211–911)

## 2019-05-04 NOTE — Addendum Note (Signed)
Addended by: Raliegh Ip on: 05/04/2019 09:46 AM   Modules accepted: Orders

## 2019-05-04 NOTE — Patient Instructions (Signed)
-Nice seeing you today!!  -Lab work today; will notify you once results are available.  -Schedule follow up in 3 months.  -Check your blood pressure at home 2-3 times a week and bring in those measurements to your next visit.   Preventive Care 20 Years and Older, Female Preventive care refers to lifestyle choices and visits with your health care provider that can promote health and wellness. This includes:  A yearly physical exam. This is also called an annual well check.  Regular dental and eye exams.  Immunizations.  Screening for certain conditions.  Healthy lifestyle choices, such as diet and exercise. What can I expect for my preventive care visit? Physical exam Your health care provider will check:  Height and weight. These may be used to calculate body mass index (BMI), which is a measurement that tells if you are at a healthy weight.  Heart rate and blood pressure.  Your skin for abnormal spots. Counseling Your health care provider may ask you questions about:  Alcohol, tobacco, and drug use.  Emotional well-being.  Home and relationship well-being.  Sexual activity.  Eating habits.  History of falls.  Memory and ability to understand (cognition).  Work and work Statistician.  Pregnancy and menstrual history. What immunizations do I need?  Influenza (flu) vaccine  This is recommended every year. Tetanus, diphtheria, and pertussis (Tdap) vaccine  You may need a Td booster every 10 years. Varicella (chickenpox) vaccine  You may need this vaccine if you have not already been vaccinated. Zoster (shingles) vaccine  You may need this after age 38. Pneumococcal conjugate (PCV13) vaccine  One dose is recommended after age 47. Pneumococcal polysaccharide (PPSV23) vaccine  One dose is recommended after age 61. Measles, mumps, and rubella (MMR) vaccine  You may need at least one dose of MMR if you were born in 1957 or later. You may also need a  second dose. Meningococcal conjugate (MenACWY) vaccine  You may need this if you have certain conditions. Hepatitis A vaccine  You may need this if you have certain conditions or if you travel or work in places where you may be exposed to hepatitis A. Hepatitis B vaccine  You may need this if you have certain conditions or if you travel or work in places where you may be exposed to hepatitis B. Haemophilus influenzae type b (Hib) vaccine  You may need this if you have certain conditions. You may receive vaccines as individual doses or as more than one vaccine together in one shot (combination vaccines). Talk with your health care provider about the risks and benefits of combination vaccines. What tests do I need? Blood tests  Lipid and cholesterol levels. These may be checked every 5 years, or more frequently depending on your overall health.  Hepatitis C test.  Hepatitis B test. Screening  Lung cancer screening. You may have this screening every year starting at age 44 if you have a 30-pack-year history of smoking and currently smoke or have quit within the past 15 years.  Colorectal cancer screening. All adults should have this screening starting at age 33 and continuing until age 50. Your health care provider may recommend screening at age 16 if you are at increased risk. You will have tests every 1-10 years, depending on your results and the type of screening test.  Diabetes screening. This is done by checking your blood sugar (glucose) after you have not eaten for a while (fasting). You may have this done every 1-3 years.  Mammogram. This may be done every 1-2 years. Talk with your health care provider about how often you should have regular mammograms.  BRCA-related cancer screening. This may be done if you have a family history of breast, ovarian, tubal, or peritoneal cancers. Other tests  Sexually transmitted disease (STD) testing.  Bone density scan. This is done to  screen for osteoporosis. You may have this done starting at age 65. Follow these instructions at home: Eating and drinking  Eat a diet that includes fresh fruits and vegetables, whole grains, lean protein, and low-fat dairy products. Limit your intake of foods with high amounts of sugar, saturated fats, and salt.  Take vitamin and mineral supplements as recommended by your health care provider.  Do not drink alcohol if your health care provider tells you not to drink.  If you drink alcohol: ? Limit how much you have to 0-1 drink a day. ? Be aware of how much alcohol is in your drink. In the [place] one drink equals one 12 oz bottle of beer (355 mL), one 5 oz glass of wine (148 mL), or one 1 oz glass of hard liquor (44 mL). Lifestyle  Take daily care of your teeth and gums.  Stay active. Exercise for at least 30 minutes on 5 or more days each week.  Do not use any products that contain nicotine or tobacco, such as cigarettes, e-cigarettes, and chewing tobacco. If you need help quitting, ask your health care provider.  If you are sexually active, practice safe sex. Use a condom or other form of protection in order to prevent STIs (sexually transmitted infections).  Talk with your health care provider about taking a low-dose aspirin or statin. What's next?  Go to your health care provider once a year for a well check visit.  Ask your health care provider how often you should have your eyes and teeth checked.  Stay up to date on all vaccines. This information is not intended to replace advice given to you by your health care provider. Make sure you discuss any questions you have with your health care provider. Document Released: 05/24/2015 Document Revised: 04/21/2018 Document Reviewed: 04/21/2018 Elsevier Patient Education  2020 [employer]  

## 2019-05-04 NOTE — Telephone Encounter (Signed)
Pt accidentally threw medicine away, please refill  Medication Refill - Medication: clonazePAM (KLONOPIN) 0.5 MG tablet   Has the patient contacted their pharmacy? Yes.   (Agent: If no, request that the patient contact the pharmacy for the refill.) (Agent: If yes, when and what did the pharmacy advise?)  Preferred Pharmacy (with phone number or street name):  [facility] [place] [facility] [address]  [place] [address]  Phone: [phone] Fax: [phone]    Agent: Please be advised that RX refills may take up to 3 business days. We ask that you follow-up with your pharmacy.

## 2019-05-04 NOTE — Progress Notes (Signed)
Established Patient Office Visit     This visit occurred during the SARS-CoV-2 public health emergency.  Safety protocols were in place, including screening questions prior to the visit, additional usage of staff PPE, and extensive cleaning of exam room while observing appropriate contact time as indicated for disinfecting solutions.    CC/Reason for Visit: Annual preventive exam and subsequent Medicare wellness visit  HPI: Rachel Walton is a 85 y.o. female who is coming in today for the above mentioned reasons. Past Medical History is significant for: Hypertension that per patient has been well controlled recently, GERD and Barrett's esophagus currently not on PPI therapy followed by Dr. Hilarie Fredrickson, inflammatory bowel disease on mesalamine also followed by Dr. Hilarie Fredrickson, she has benign essential tremor on Klonopin used to see neurology through [facility] but not anymore, she has a history of hyperlipidemia but is not on any medication secondary to severe intolerance to statins with myalgias, also past history of vitamin D deficiency.  She has been feeling generally weak for the past 3 weeks or so.  In the last 2 weeks she has had 2 deaths in her family due to Covid, her sister died 2 weeks ago and her cousin just last week.  They were living at the same nursing facility.  She has routine dental care but no eye care.  She is having issues hearing but has an ENT that she sees routinely, she is very sedentary does not do much exercise.   Past Medical/Surgical History: Past Medical History:  Diagnosis Date  . Acute gastric ulcer   . Allergy   . Anxiety   . Atrophic vaginitis   . Barrett's esophagus   . Basal cell carcinoma of nose     under nose  . Cataract   . Depression   . Diverticulosis   . Esophageal stricture   . Esophageal ulcer   . Fatty liver   . GERD (gastroesophageal reflux disease)   . Heart murmur   . Hiatal hernia   . Hx of adenomatous colonic polyps   .  Hyperlipidemia   . Hypertension   . Hypokalemia   . IBS (irritable bowel syndrome)   . Kyphosis   . Lymphocytic colitis   . Macular degeneration    "?wet or dry; ? both eyes"  . 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 Osteopenia of the elderly   . Pneumonia X 1  . Postmenopausal   . Recurrent UTI   . 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 Tremor   . Vitamin D deficiency     Past Surgical History:  Procedure Laterality Date  . ABDOMINAL HYSTERECTOMY  1980  . APPENDECTOMY    . BACK SURGERY    . BREAST BIOPSY Left   . CATARACT EXTRACTION W/ INTRAOCULAR LENS  IMPLANT, BILATERAL Bilateral   . COLONOSCOPY    . ESOPHAGOGASTRODUODENOSCOPY (EGD) WITH ESOPHAGEAL DILATION  X 1  . EYE SURGERY    . LUMBAR DISC SURGERY     "fragmented disc"  . POLYPECTOMY    . SQUAMOUS CELL CARCINOMA EXCISION     nose tip    Social History:  reports that she has never smoked. She has never used smokeless tobacco. She reports that she does not drink alcohol or use drugs.  Allergies: Allergies  Allergen Reactions  . Alendronate Sodium Other (See Comments)    Caused chest pain  . Lexapro [Escitalopram Oxalate] Other (See Comments)  Makes lymphangitic colitis worse  . Amoxicillin Other (See Comments)    diarrhea  . Aspirin Other (See Comments)    Per the patient, made the gastric ulcer worse.  . Cephalexin   . Clindamycin/Lincomycin   . Keflex [Cephalexin]   . Levofloxacin Other (See Comments)    Insomnia and headache  . Prednisone Other (See Comments)    Reaction is unknown..then states nervousness  . Sudafed [Pseudoephedrine Hcl] Other (See Comments)    insomnia  . Sulfa Antibiotics Hives  . Toprol Xl [Metoprolol Succinate] Other (See Comments)    Unknown reaction  . Trimethoprim     Unknown reaction  . Zithromax [Azithromycin] Rash    Family History:  Family History  Problem Relation Age of Onset  . Stroke Mother   . Heart disease Father        MI  . Heart  attack Father   . Cancer Sister        Brain tumor  . Hypertension Sister   . Osteoporosis Sister   . Colon cancer Brother   . Heart disease Brother   . Diabetes Son 8  . Diabetes Maternal Uncle   . Diabetes Son 31  . Hypertension Sister   . Breast cancer Sister 59  . Hypertension Sister   . Cancer Sister        breast  . Osteoporosis Sister   . Hypertension Sister   . Hypertension Sister   . Stroke Sister   . Hypertension Sister   . Hypertension Sister   . Colon cancer Other        nephew  . Brain cancer Son        deceased  . Cancer Son        brain  . Thyroid cancer Other        niece  . Breast cancer Other   . Breast cancer Other   . Breast cancer Other   . Colon polyps Neg Hx   . Esophageal cancer Neg Hx   . Rectal cancer Neg Hx   . Stomach cancer Neg Hx      Current Outpatient Medications:  .  budesonide (ENTOCORT EC) 3 MG 24 hr capsule, Take 1 capsule (3 mg total) by mouth daily., Disp: 30 capsule, Rfl: 3 .  Calcium Carbonate-Vitamin D (CALCIUM 600+D) 600-200 MG-UNIT TABS, Take 1 tablet by mouth daily. Take with a meal (Patient taking differently: Take 1-2 tablets by mouth daily. Take with a meal), Disp: , Rfl: 0 .  clonazePAM (KLONOPIN) 0.5 MG tablet, Take 0.5-1 tablets (0.25-0.5 mg total) by mouth 2 (two) times daily as needed for anxiety., Disp: 180 tablet, Rfl: 0 .  fexofenadine (ALLEGRA) 180 MG tablet, Take 180 mg by mouth daily., Disp: , Rfl:  .  fluticasone (FLONASE) 50 MCG/ACT nasal spray, Place 2 sprays into both nostrils daily., Disp: 16 g, Rfl: 6 .  hydrocortisone (PROCTOSOL HC) 2.5 % rectal cream, Place 1 application rectally 2 (two) times daily., Disp: 30 g, Rfl: 0 .  lisinopril (ZESTRIL) 20 MG tablet, Take 1 tablet (20 mg total) by mouth daily., Disp: 90 tablet, Rfl: 3 .  mesalamine (LIALDA) 1.2 g EC tablet, Take 2 tablets (2.4 g total) by mouth daily., Disp: 60 tablet, Rfl: 3 .  Multiple Vitamins-Minerals (PRESERVISION AREDS 2) CHEW, Chew 2 each  by mouth daily., Disp: , Rfl:  .  potassium chloride (K-DUR) 10 MEQ tablet, TAKE ONE (1) TABLET EACH DAY, Disp: 90 tablet, Rfl: 1 .  Wheat  Dextrin (BENEFIBER) POWD, Take 1 Dose by mouth daily as needed. , Disp: , Rfl:   Review of Systems:  Constitutional: Denies fever, chills, diaphoresis, appetite change and fatigue.  HEENT: Denies photophobia, eye pain, redness, hearing loss, ear pain, congestion, sore throat, rhinorrhea, sneezing, mouth sores, trouble swallowing, neck pain, neck stiffness and tinnitus.   Respiratory: Denies SOB, DOE, cough, chest tightness,  and wheezing.   Cardiovascular: Denies chest pain, palpitations and leg swelling.  Gastrointestinal: Denies nausea, vomiting, abdominal pain, diarrhea, constipation, blood in stool and abdominal distention.  Genitourinary: Denies dysuria, urgency, frequency, hematuria, flank pain and difficulty urinating.  Endocrine: Denies: hot or cold intolerance, sweats, changes in hair or nails, polyuria, polydipsia. Musculoskeletal: Denies myalgias, back pain, joint swelling, arthralgias and gait problem.  Skin: Denies pallor, rash and wound.  Neurological: Denies dizziness, seizures, syncope, weakness, light-headedness, numbness and headaches.  Hematological: Denies adenopathy. Easy bruising, personal or family bleeding history  Psychiatric/Behavioral: Denies suicidal ideation, mood changes, confusion, nervousness, sleep disturbance and agitation    Physical Exam: Vitals:   05/04/19 0841  BP: (!) 160/80  Pulse: 73  Temp: 97.6 F (36.4 C)  TempSrc: Temporal  SpO2: 97%  Weight: 124 lb (56.2 kg)  Height: 5' 1"  (1.549 m)    Body mass index is 23.43 kg/m.   Constitutional: NAD, calm, comfortable Eyes: PERRL, lids and conjunctivae normal ENMT: Mucous membranes are moist. Tympanic membrane is pearly white, no erythema or bulging. Neck: normal, supple, no masses, no thyromegaly Respiratory: clear to auscultation bilaterally, no wheezing,  no crackles. Normal respiratory effort. No accessory muscle use.  Cardiovascular: irregular rate and rhythm, no murmurs / rubs / gallops. No extremity edema. 2+ pedal pulses. No carotid bruits.  Abdomen: no tenderness, no masses palpated. No hepatosplenomegaly. Bowel sounds positive.  Musculoskeletal: no clubbing / cyanosis. No joint deformity upper and lower extremities. Good ROM, no contractures. Normal muscle tone.  Skin: no rashes, lesions, ulcers. No induration Neurologic: CN 2-12 grossly intact. Sensation intact, DTR normal. Strength 5/5 in all 4.  Psychiatric: Normal judgment and insight. Alert and oriented x 3. Normal mood.    Subsequent Medicare wellness visit   1. Risk factors, based on past  M,S,F -cardiovascular disease risk factors include age, history of hypertension, history of hyperlipidemia   2.  Physical activities: Very sedentary not much physical activity   3.  Depression/mood:  A little depressed today, likely reactive due to recent deaths in her family   52.  Hearing:  Significant issues, followed by ENT   5.  ADL's: Independent in all ADLs   6.  Fall risk:  Low fall risk   7.  Home safety: No problems identified   8.  Height weight, and visual acuity: Height and weight as above, visual acuity is 20/50 bilaterally   9.  Counseling:  Advised ophthalmology follow-up.   10. Lab orders based on risk factors: Laboratory update will be reviewed   11. Referral :  Ophthalmology   12. Care plan:  Follow-up with me in 3 to 4 months due to hypertension   13. Cognitive assessment:  No cognitive impairment   14. Screening: Patient provided with a written and personalized 5-10 year screening schedule in the AVS.   yes   15. Provider List Update:   PCP, GI (Dr. Hilarie Fredrickson)  75. Advance Directives: Full code     Office Visit from 05/04/2019 in [facility]  PHQ-9 Total Score  6      Fall Risk  05/04/2019 12/15/2018 11/09/2018 09/28/2018 09/19/2018    Falls in the past year? 1 0 1 0 0  Number falls in past yr: 0 0 0 - -  Injury with Fall? 0 0 - - -  Comment - - - - -  Risk for fall due to : - - - - -  Follow up - - - - -     Impression and Plan:  Cardiac arrhythmia, unspecified cardiac arrhythmia type  -Due to an irregular heart rhythm on auscultation today, EKG has been done in office and interpreted by myself as sinus rhythm with PACs. -She has no palpitations, no chest pain, no shortness of breath. -Follow for now. -Check electrolytes.  Vision changes  - Plan: Ambulatory referral to Ophthalmology  Encounter for preventive health examination -I have advised routine eye and dental care. -Immunizations are up-to-date and age-appropriate. -Screening labs today. -Healthy lifestyle has been discussed in detail. -She has elected to defer colon, breast, cervical cancer screening due to age, I agree.  Essential tremor  -On Klonopin, followed by neurology  Essential hypertension  -Uncontrolled, she states she is nervous today, have asked for ambulatory measurements and follow-up in 3 months.  Hyperlipidemia, unspecified hyperlipidemia type  - Plan: Lipid panel -Last LDL was 98 in December 2019, not currently on a statin.  Barrett's esophagus without dysplasia Lymphocytic colitis GERD -All followed by GI.  Recurrent major depressive disorder, in partial remission (HCC) -PHQ-9 is 6 today, mostly on account of recent deaths in family, continue to monitor.   Generalized weakness  - Plan: TSH, Vitamin B12, VITAMIN D 25 Hydroxy (Vit-D Deficiency, Fractures)    Patient Instructions  -Nice seeing you today!!  -Lab work today; will notify you once results are available.  -Schedule follow up in 3 months.  -Check your blood pressure at home 2-3 times a week and bring in those measurements to your next visit.   Preventive Care 51 Years and Older, Female Preventive care refers to lifestyle choices and visits with your  health care provider that can promote health and wellness. This includes:  A yearly physical exam. This is also called an annual well check.  Regular dental and eye exams.  Immunizations.  Screening for certain conditions.  Healthy lifestyle choices, such as diet and exercise. What can I expect for my preventive care visit? Physical exam Your health care provider will check:  Height and weight. These may be used to calculate body mass index (BMI), which is a measurement that tells if you are at a healthy weight.  Heart rate and blood pressure.  Your skin for abnormal spots. Counseling Your health care provider may ask you questions about:  Alcohol, tobacco, and drug use.  Emotional well-being.  Home and relationship well-being.  Sexual activity.  Eating habits.  History of falls.  Memory and ability to understand (cognition).  Work and work Statistician.  Pregnancy and menstrual history. What immunizations do I need?  Influenza (flu) vaccine  This is recommended every year. Tetanus, diphtheria, and pertussis (Tdap) vaccine  You may need a Td booster every 10 years. Varicella (chickenpox) vaccine  You may need this vaccine if you have not already been vaccinated. Zoster (shingles) vaccine  You may need this after age 82. Pneumococcal conjugate (PCV13) vaccine  One dose is recommended after age 78. Pneumococcal polysaccharide (PPSV23) vaccine  One dose is recommended after age 103. Measles, mumps, and rubella (MMR) vaccine  You may need at least one dose of MMR if you  were born in [birth year] or later. You may also need a second dose. Meningococcal conjugate (MenACWY) vaccine  You may need this if you have certain conditions. Hepatitis A vaccine  You may need this if you have certain conditions or if you travel or work in places where you may be exposed to hepatitis A. Hepatitis B vaccine  You may need this if you have certain conditions or if you travel or  work in places where you may be exposed to hepatitis B. Haemophilus influenzae type b (Hib) vaccine  You may need this if you have certain conditions. You may receive vaccines as individual doses or as more than one vaccine together in one shot (combination vaccines). Talk with your health care provider about the risks and benefits of combination vaccines. What tests do I need? Blood tests  Lipid and cholesterol levels. These may be checked every 5 years, or more frequently depending on your overall health.  Hepatitis C test.  Hepatitis B test. Screening  Lung cancer screening. You may have this screening every year starting at age 42 if you have a 30-pack-year history of smoking and currently smoke or have quit within the past 15 years.  Colorectal cancer screening. All adults should have this screening starting at age 39 and continuing until age 86. Your health care provider may recommend screening at age 30 if you are at increased risk. You will have tests every 1-10 years, depending on your results and the type of screening test.  Diabetes screening. This is done by checking your blood sugar (glucose) after you have not eaten for a while (fasting). You may have this done every 1-3 years.  Mammogram. This may be done every 1-2 years. Talk with your health care provider about how often you should have regular mammograms.  BRCA-related cancer screening. This may be done if you have a family history of breast, ovarian, tubal, or peritoneal cancers. Other tests  Sexually transmitted disease (STD) testing.  Bone density scan. This is done to screen for osteoporosis. You may have this done starting at age 65. Follow these instructions at home: Eating and drinking  Eat a diet that includes fresh fruits and vegetables, whole grains, lean protein, and low-fat dairy products. Limit your intake of foods with high amounts of sugar, saturated fats, and salt.  Take vitamin and mineral  supplements as recommended by your health care provider.  Do not drink alcohol if your health care provider tells you not to drink.  If you drink alcohol: ? Limit how much you have to 0-1 drink a day. ? Be aware of how much alcohol is in your drink. In the [place] one drink equals one 12 oz bottle of beer (355 mL), one 5 oz glass of wine (148 mL), or one 1 oz glass of hard liquor (44 mL). Lifestyle  Take daily care of your teeth and gums.  Stay active. Exercise for at least 30 minutes on 5 or more days each week.  Do not use any products that contain nicotine or tobacco, such as cigarettes, e-cigarettes, and chewing tobacco. If you need help quitting, ask your health care provider.  If you are sexually active, practice safe sex. Use a condom or other form of protection in order to prevent STIs (sexually transmitted infections).  Talk with your health care provider about taking a low-dose aspirin or statin. What's next?  Go to your health care provider once a year for a well check visit.  Ask your health  care provider how often you should have your eyes and teeth checked.  Stay up to date on all vaccines. This information is not intended to replace advice given to you by your health care provider. Make sure you discuss any questions you have with your health care provider. Document Released: 05/24/2015 Document Revised: 04/21/2018 Document Reviewed: 04/21/2018 Elsevier Patient Education  2020 [place], [place] [facility]

## 2019-05-09 ENCOUNTER — Other Ambulatory Visit: Payer: Self-pay | Admitting: Internal Medicine

## 2019-05-09 DIAGNOSIS — E785 Hyperlipidemia, unspecified: Secondary | ICD-10-CM

## 2019-05-09 MED ORDER — EZETIMIBE 10 MG PO TABS: 10.0000 mg | ORAL_TABLET | Freq: Every day | ORAL | 1 refills | Status: DC

## 2019-05-09 NOTE — Telephone Encounter (Signed)
I believe her klonopin is prescribed by her neurologist for benign essential tremor? If so, will need to go thru them for Rx.  [place]

## 2019-05-09 NOTE — Telephone Encounter (Signed)
Rx denied and note sent to the pharmacy.

## 2019-06-02 ENCOUNTER — Telehealth: Payer: Self-pay | Admitting: Internal Medicine

## 2019-06-02 NOTE — Telephone Encounter (Signed)
Patient will take Dr Ledell Noss advise and will try Zetia again.  Patient also says she has tried Crestor 5 mg with no problem.

## 2019-06-02 NOTE — Telephone Encounter (Signed)
Pt called in regards of her medication Cetiab-zetia. Pt felt terrible and weak the next day after taking the medication. Pt will not take anymore of that medication and is wondering if she can prescribe her a different brand. Pt can be contacted at [phone]

## 2019-06-02 NOTE — Telephone Encounter (Signed)
Are we talking about Zetia? If she only took one dose I would rechallenge her with it as it is likely to be something else. Weakness is not a common side effect of Zetia. Since she has a statin intolerance, no other meds that can be prescribed.

## 2019-07-08 ENCOUNTER — Encounter (HOSPITAL_COMMUNITY): Payer: Self-pay | Admitting: Emergency Medicine

## 2019-07-08 ENCOUNTER — Emergency Department (HOSPITAL_COMMUNITY): Payer: Medicare PPO

## 2019-07-08 ENCOUNTER — Other Ambulatory Visit: Payer: Self-pay

## 2019-07-08 ENCOUNTER — Emergency Department (HOSPITAL_COMMUNITY)
Admission: EM | Admit: 2019-07-08 | Discharge: 2019-07-08 | Disposition: A | Discharge status: Home / Self Care | Payer: Medicare PPO | Attending: Emergency Medicine | Admitting: Emergency Medicine

## 2019-07-08 DIAGNOSIS — R519 Headache, unspecified: Secondary | ICD-10-CM | POA: Insufficient documentation

## 2019-07-08 DIAGNOSIS — S0083XD Contusion of other part of head, subsequent encounter: Secondary | ICD-10-CM | POA: Diagnosis not present

## 2019-07-08 DIAGNOSIS — I10 Essential (primary) hypertension: Secondary | ICD-10-CM | POA: Diagnosis not present

## 2019-07-08 DIAGNOSIS — S0093XA Contusion of unspecified part of head, initial encounter: Secondary | ICD-10-CM

## 2019-07-08 DIAGNOSIS — W19XXXD Unspecified fall, subsequent encounter: Secondary | ICD-10-CM | POA: Diagnosis not present

## 2019-07-08 DIAGNOSIS — W19XXXA Unspecified fall, initial encounter: Secondary | ICD-10-CM

## 2019-07-08 NOTE — ED Provider Notes (Signed)
[facility] Provider Note   CSN: [id number] Arrival date & time: 07/08/19  1013     History Chief Complaint  Patient presents with   Rachel Walton    Rachel Walton is a 86 y.o. female.  The history is provided by the patient. No language interpreter was used.  Fall This is a new problem. The problem occurs constantly. The problem has not changed since onset.Associated symptoms include headaches. Nothing aggravates the symptoms. Nothing relieves the symptoms. She has tried nothing for the symptoms. The treatment provided no relief.   Pt reports she fell and hit her head on 2/24. Pt has been seen at urgent care twice.  Pt reports she came in today because she still has a headache.     Past Medical History:  Diagnosis Date   Acute gastric ulcer    Allergy    Anxiety    Atrophic vaginitis    Barrett's esophagus    Basal cell carcinoma of nose     under nose   Cataract    Depression    Diverticulosis    Esophageal stricture    Esophageal ulcer    Fatty liver    GERD (gastroesophageal reflux disease)    Heart murmur    Hiatal hernia    Hx of adenomatous colonic polyps    Hyperlipidemia    Hypertension    Hypokalemia    IBS (irritable bowel syndrome)    Kyphosis    Lymphocytic colitis    Macular degeneration    "?wet or dry; ? 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    Tremor    Vitamin D deficiency     Patient Active Problem List   Diagnosis Date Noted   Osteopenia with high risk of fracture 10/21/2016   Essential tremor 05/20/2016   Age-related macular degeneration, dry, both eyes 04/07/2016   Basal cell carcinoma of nose 02/05/2016   Lymphocytic colitis 12/20/2015   Barrett's esophagus 12/20/2015   Meningoencephalitis 11/07/2015   Depression 11/07/2015    Chest pain 10/09/2015   Hypokalemia 10/09/2015   Anemia 10/09/2015   Blepharoptosis, acquired, 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   Esophageal stricture 12/11/2013   Allergic rhinitis due to pollen 12/11/2013   Essential hypertension 12/11/2013   Amblyopia ex anopsia of right eye 12/07/2013   Generalized anxiety disorder 08/21/2013   Gall stones 06/15/2013   Degenerative drusen 05/05/2013   Hyperlipemia 11/21/2012   Postmenopausal    Colon polyp    Osteopenia of the elderly    Kyphosis    Recurrent UTI    Atrophic vaginitis    Diverticulosis     Past Surgical History:  Procedure Laterality Date   ABDOMINAL HYSTERECTOMY  1980   APPENDECTOMY     BACK SURGERY     BREAST BIOPSY Left    CATARACT EXTRACTION W/ INTRAOCULAR LENS  IMPLANT, BILATERAL Bilateral    COLONOSCOPY     ESOPHAGOGASTRODUODENOSCOPY (EGD) WITH ESOPHAGEAL DILATION  X 1   EYE SURGERY     LUMBAR DISC SURGERY     "fragmented disc"   POLYPECTOMY     SQUAMOUS CELL CARCINOMA EXCISION     nose tip     OB History   No obstetric history on file.     Family History  Problem Relation Age of Onset  Stroke Mother    Heart disease Father        MI   Heart attack Father    Cancer Sister        Brain tumor   Hypertension Sister    Osteoporosis Sister    Colon cancer Brother    Heart disease Brother    Diabetes Son 58   Diabetes Maternal Uncle    Diabetes Son 3   Hypertension Sister    Breast cancer Sister 29   Hypertension Sister    Cancer Sister        breast   Osteoporosis Sister    Hypertension Sister    Hypertension Sister    Stroke Sister    Hypertension Sister    Hypertension Sister    Colon cancer Other        nephew   Brain cancer Son        deceased   Cancer Son        brain   Thyroid cancer Other        niece   Breast cancer Other    Breast cancer Other    Breast cancer Other    Colon polyps Neg Hx    Esophageal cancer Neg Hx     Rectal cancer Neg Hx    Stomach cancer Neg Hx     Social History   Tobacco Use   Smoking status: Never Smoker   Smokeless tobacco: Never Used  Substance Use Topics   Alcohol use: No    Alcohol/week: 0.0 standard drinks   Drug use: No    Home Medications Prior to Admission medications   Medication Sig Start Date End Date Taking? Authorizing Provider  budesonide (ENTOCORT EC) 3 MG 24 hr capsule Take 1 capsule (3 mg total) by mouth daily. 03/07/19   Pyrtle, Lajuan Lines, MD  Calcium Carbonate-Vitamin D (CALCIUM 600+D) 600-200 MG-UNIT TABS Take 1 tablet by mouth daily. Take with a meal Patient taking differently: Take 1-2 tablets by mouth daily. Take with a meal 12/30/15   Cherre Robins, PharmD  clonazePAM (KLONOPIN) 0.5 MG tablet Take 0.5-1 tablets (0.25-0.5 mg total) by mouth 2 (two) times daily as needed for anxiety. 11/09/18   Janora Norlander, DO  ezetimibe (ZETIA) 10 MG tablet Take 1 tablet (10 mg total) by mouth daily. 05/09/19   Isaac Bliss, Rayford Halsted, MD  fexofenadine (ALLEGRA) 180 MG tablet Take 180 mg by mouth daily.    [provider]  fluticasone (FLONASE) 50 MCG/ACT nasal spray Place 2 sprays into both nostrils daily. 09/19/18   Hassell Done, Mary-Margaret, FNP  hydrocortisone (PROCTOSOL HC) 2.5 % rectal cream Place 1 application rectally 2 (two) times daily. 03/07/19   Pyrtle, Lajuan Lines, MD  lisinopril (ZESTRIL) 20 MG tablet Take 1 tablet (20 mg total) by mouth daily. 09/28/18   Hassell Done, Mary-Margaret, FNP  mesalamine (LIALDA) 1.2 g EC tablet Take 2 tablets (2.4 g total) by mouth daily. 03/07/19   Pyrtle, Lajuan Lines, MD  Multiple Vitamins-Minerals (PRESERVISION AREDS 2) CHEW Chew 2 each by mouth daily.    [provider]  potassium chloride (K-DUR) 10 MEQ tablet TAKE ONE (1) TABLET EACH DAY 11/09/18   Gottschalk, Ashly M, DO  Wheat Dextrin (BENEFIBER) POWD Take 1 Dose by mouth daily as needed.     [provider]    Allergies    Alendronate sodium, Lexapro  [escitalopram oxalate], Amoxicillin, Aspirin, Cephalexin, Clindamycin/lincomycin, Keflex [cephalexin], Levofloxacin, Prednisone, Sudafed [pseudoephedrine hcl], Sulfa antibiotics, Toprol xl [metoprolol succinate],  Trimethoprim, and Zithromax [azithromycin]  Review of Systems   Review of Systems  Neurological: Positive for headaches.    Physical Exam Updated Vital Signs BP (!) 152/54 (BP Location: Left Arm)    Pulse 69    Temp 97.7 F (36.5 C) (Oral)    Resp 18    Ht 5\' 1"  (1.549 m)    Wt 56.2 kg    SpO2 98%    BMI 23.41 kg/m   Physical Exam  ED Results / Procedures / Treatments   Labs (all labs ordered are listed, but only abnormal results are displayed) Labs Reviewed - No data to display  EKG None  Radiology CT Head Wo Contrast  Result Date: 07/08/2019 CLINICAL DATA:  Patient fell backwards onto concrete 3 days ago. Persistent head and neck pain. EXAM: CT HEAD WITHOUT CONTRAST CT CERVICAL SPINE WITHOUT CONTRAST TECHNIQUE: Multidetector CT imaging of the head and cervical spine was performed following the standard protocol without intravenous contrast. Multiplanar CT image reconstructions of the cervical spine were also generated. COMPARISON:  02/21/2013 FINDINGS: CT HEAD FINDINGS Brain: There is no evidence for acute hemorrhage, hydrocephalus, mass lesion, or abnormal extra-axial fluid collection. No definite CT evidence for acute infarction. Diffuse loss of parenchymal volume is consistent with atrophy. Patchy low attenuation in the deep hemispheric and periventricular white matter is nonspecific, but likely reflects chronic microvascular ischemic demyelination. Vascular: No hyperdense vessel or unexpected calcification. Skull: No evidence for fracture. No worrisome lytic or sclerotic lesion. Sinuses/Orbits: Right mastoid air cells are opacified in the may be some fluid in the right middle ear. Visualized paranasal sinuses and left mastoid air cells are clear. Visualized portions of the  globes and intraorbital fat are unremarkable. Other: None. CT CERVICAL SPINE FINDINGS Alignment: Mild straightening of normal cervical lordosis without subluxation. Skull base and vertebrae: No acute fracture. No primary bone lesion or focal pathologic process. Soft tissues and spinal canal: No prevertebral fluid or swelling. No visible canal hematoma. Disc levels: Complete loss of disc height noted C4-5, C5-6, and C6-7 with associated endplate degeneration. Degenerative loss of disc height noted at C4-5 and C7-T1. There is fairly diffuse but mild facet osteoarthritis bilaterally and the left C4-5 facets are fused. Upper chest: Biapical pleuroparenchymal scarring. Other: None. IMPRESSION: 1. No acute intracranial abnormality. 2. Atrophy with chronic small vessel white matter ischemic disease. 3. Right mastoid effusion. No temporal bone/skull base fracture evident on this exam. No evidence for subarachnoid hemorrhage in the right temporal lobe or right posterior fossa. If the patient has mechanism of injury for or high clinical suspicion of temporal bone fracture, dedicated temporal bone CT may be warranted. 4. Degenerative changes in the cervical spine without fracture. 5. Loss of cervical lordosis. This can be related to patient positioning, muscle spasm or soft tissue injury. Electronically Signed   By: Misty Stanley M.D.   On: 07/08/2019 11:35   CT Cervical Spine Wo Contrast  Result Date: 07/08/2019 CLINICAL DATA:  Patient fell backwards onto concrete 3 days ago. Persistent head and neck pain. EXAM: CT HEAD WITHOUT CONTRAST CT CERVICAL SPINE WITHOUT CONTRAST TECHNIQUE: Multidetector CT imaging of the head and cervical spine was performed following the standard protocol without intravenous contrast. Multiplanar CT image reconstructions of the cervical spine were also generated. COMPARISON:  02/21/2013 FINDINGS: CT HEAD FINDINGS Brain: There is no evidence for acute hemorrhage, hydrocephalus, mass lesion, or  abnormal extra-axial fluid collection. No definite CT evidence for acute infarction. Diffuse loss of parenchymal volume is consistent with  atrophy. Patchy low attenuation in the deep hemispheric and periventricular white matter is nonspecific, but likely reflects chronic microvascular ischemic demyelination. Vascular: No hyperdense vessel or unexpected calcification. Skull: No evidence for fracture. No worrisome lytic or sclerotic lesion. Sinuses/Orbits: Right mastoid air cells are opacified in the may be some fluid in the right middle ear. Visualized paranasal sinuses and left mastoid air cells are clear. Visualized portions of the globes and intraorbital fat are unremarkable. Other: None. CT CERVICAL SPINE FINDINGS Alignment: Mild straightening of normal cervical lordosis without subluxation. Skull base and vertebrae: No acute fracture. No primary bone lesion or focal pathologic process. Soft tissues and spinal canal: No prevertebral fluid or swelling. No visible canal hematoma. Disc levels: Complete loss of disc height noted C4-5, C5-6, and C6-7 with associated endplate degeneration. Degenerative loss of disc height noted at C4-5 and C7-T1. There is fairly diffuse but mild facet osteoarthritis bilaterally and the left C4-5 facets are fused. Upper chest: Biapical pleuroparenchymal scarring. Other: None. IMPRESSION: 1. No acute intracranial abnormality. 2. Atrophy with chronic small vessel white matter ischemic disease. 3. Right mastoid effusion. No temporal bone/skull base fracture evident on this exam. No evidence for subarachnoid hemorrhage in the right temporal lobe or right posterior fossa. If the patient has mechanism of injury for or high clinical suspicion of temporal bone fracture, dedicated temporal bone CT may be warranted. 4. Degenerative changes in the cervical spine without fracture. 5. Loss of cervical lordosis. This can be related to patient positioning, muscle spasm or soft tissue injury.  Electronically Signed   By: Misty Stanley M.D.   On: 07/08/2019 11:35    Procedures Procedures (including critical care time)  Medications Ordered in ED Medications - No data to display  ED Course  I have reviewed the triage vital signs and the nursing notes.  Pertinent labs & imaging results that were available during my care of the patient were reviewed by me and considered in my medical decision making (see chart for details).    MDM Rules/Calculators/A&P                       Final Clinical Impression(s) / ED Diagnoses Final diagnoses:  Fall, initial encounter  Contusion of head, unspecified part of head, initial encounter    Rx / DC Orders ED Discharge Orders    None    An After Visit Summary was printed and given to the patient.    Sidney Ace 07/08/19 [place], Joseph, MD 07/09/19 [phone]

## 2019-07-08 NOTE — ED Triage Notes (Signed)
Pt reports a fall backwards onto concrete on 07/05/19. Was seen twice at [facility] with no imaging done. Her head and neck still hurt.

## 2019-07-08 NOTE — Discharge Instructions (Addendum)
Continue taking tylenol for pain.  Your ct scan of your neck shows arthritis.  Your head ct is normal

## 2019-07-09 ENCOUNTER — Ambulatory Visit: Payer: Medicare PPO

## 2019-08-02 ENCOUNTER — Ambulatory Visit: Payer: Medicare Other | Admitting: Internal Medicine

## 2019-08-15 ENCOUNTER — Ambulatory Visit: Payer: Medicare Other | Admitting: Internal Medicine

## 2019-08-15 ENCOUNTER — Other Ambulatory Visit: Payer: Self-pay

## 2019-08-16 ENCOUNTER — Encounter: Payer: Self-pay | Admitting: Internal Medicine

## 2019-08-16 ENCOUNTER — Telehealth: Payer: Self-pay | Admitting: Internal Medicine

## 2019-08-16 ENCOUNTER — Ambulatory Visit (INDEPENDENT_AMBULATORY_CARE_PROVIDER_SITE_OTHER): Payer: Medicare PPO | Admitting: Internal Medicine

## 2019-08-16 VITALS — BP 180/80 | HR 71 | Temp 97.8°F | Wt 126.0 lb

## 2019-08-16 DIAGNOSIS — I10 Essential (primary) hypertension: Secondary | ICD-10-CM

## 2019-08-16 DIAGNOSIS — E785 Hyperlipidemia, unspecified: Secondary | ICD-10-CM

## 2019-08-16 DIAGNOSIS — J302 Other seasonal allergic rhinitis: Secondary | ICD-10-CM

## 2019-08-16 DIAGNOSIS — F411 Generalized anxiety disorder: Secondary | ICD-10-CM | POA: Diagnosis not present

## 2019-08-16 DIAGNOSIS — G250 Essential tremor: Secondary | ICD-10-CM | POA: Diagnosis not present

## 2019-08-16 DIAGNOSIS — H9190 Unspecified hearing loss, unspecified ear: Secondary | ICD-10-CM

## 2019-08-16 NOTE — Telephone Encounter (Signed)
[facility]  [facility]   [place], [place] [id number]  [phone]  Audry Pili Emerson,HIS)  Pt state that this is information for both her and her spouse for their hearing aid.

## 2019-08-16 NOTE — Patient Instructions (Signed)
-  Nice seeing you today!!  -Referrals to audiology and neurology today.  -See you back in 6 months for your annual physical. Please come in fasting that day.

## 2019-08-16 NOTE — Progress Notes (Signed)
Established Patient Office Visit     This visit occurred during the SARS-CoV-2 public health emergency.  Safety protocols were in place, including screening questions prior to the visit, additional usage of staff PPE, and extensive cleaning of exam room while observing appropriate contact time as indicated for disinfecting solutions.    CC/Reason for Visit: Follow-up chronic medical conditions  HPI: Rachel Walton is a 86 y.o. female who is coming in today for the above mentioned reasons. Past Medical History is significant for: Hypertension that per patient has been well controlled recently, GERD and Barrett's esophagus currently not on PPI therapy followed by Dr. Hilarie Fredrickson, inflammatory bowel disease on mesalamine also followed by Dr. Hilarie Fredrickson, she has benign essential tremor on Klonopin used to see neurology through [facility] but not anymore, she has a history of hyperlipidemia but is not on any medication secondary to severe intolerance to statins with myalgias, also past history of vitamin D deficiency.  She believes her tremor is getting worse, does not believe the Klonopin is doing much for her anymore, she is requesting referral to neurology for opinion.  She has been having a lot of sinus headaches and runny nose since spring started.  She would also like an audiology referral as hearing loss is getting worse.  She is here mainly for follow-up on blood pressure, blood pressure is elevated in office today, however she has brought in a blood pressure log with measurements as follows:  119/61 125/66 163/53 117/52 113/69 129/57 115/63 120/60 148/57   Past Medical/Surgical History: Past Medical History:  Diagnosis Date  . Acute gastric ulcer   . Allergy   . Anxiety   . Atrophic vaginitis   . Barrett's esophagus   . Basal cell carcinoma of nose     under nose  . Cataract   . Depression   . Diverticulosis   . Esophageal stricture   . Esophageal ulcer   . Fatty liver   .  GERD (gastroesophageal reflux disease)   . Heart murmur   . Hiatal hernia   . Hx of adenomatous colonic polyps   . Hyperlipidemia   . Hypertension   . Hypokalemia   . IBS (irritable bowel syndrome)   . Kyphosis   . Lymphocytic colitis   . Macular degeneration    "?wet or dry; ? both eyes"  . 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 Osteopenia of the elderly   . Pneumonia X 1  . Postmenopausal   . Recurrent UTI   . 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 Tremor   . Vitamin D deficiency     Past Surgical History:  Procedure Laterality Date  . ABDOMINAL HYSTERECTOMY  1980  . APPENDECTOMY    . BACK SURGERY    . BREAST BIOPSY Left   . CATARACT EXTRACTION W/ INTRAOCULAR LENS  IMPLANT, BILATERAL Bilateral   . COLONOSCOPY    . ESOPHAGOGASTRODUODENOSCOPY (EGD) WITH ESOPHAGEAL DILATION  X 1  . EYE SURGERY    . LUMBAR DISC SURGERY     "fragmented disc"  . POLYPECTOMY    . SQUAMOUS CELL CARCINOMA EXCISION     nose tip    Social History:  reports that she has never smoked. She has never used smokeless tobacco. She reports that she does not drink alcohol or use drugs.  Allergies: Allergies  Allergen Reactions  . Alendronate Sodium Other (See Comments)    Caused chest pain  . Lexapro [  Escitalopram Oxalate] Other (See Comments)    Makes lymphangitic colitis worse  . Amoxicillin Other (See Comments)    diarrhea  . Aspirin Other (See Comments)    Per the patient, made the gastric ulcer worse.  . Cephalexin   . Clindamycin/Lincomycin   . Keflex [Cephalexin]   . Levofloxacin Other (See Comments)    Insomnia and headache  . Prednisone Other (See Comments)    Reaction is unknown..then states nervousness  . Sudafed [Pseudoephedrine Hcl] Other (See Comments)    insomnia  . Sulfa Antibiotics Hives  . Toprol Xl [Metoprolol Succinate] Other (See Comments)    Unknown reaction  . Trimethoprim     Unknown reaction  . Zithromax [Azithromycin] Rash    Family  History:  Family History  Problem Relation Age of Onset  . Stroke Mother   . Heart disease Father        MI  . Heart attack Father   . Cancer Sister        Brain tumor  . Hypertension Sister   . Osteoporosis Sister   . Colon cancer Brother   . Heart disease Brother   . Diabetes Son 8  . Diabetes Maternal Uncle   . Diabetes Son 56  . Hypertension Sister   . Breast cancer Sister 71  . Hypertension Sister   . Cancer Sister        breast  . Osteoporosis Sister   . Hypertension Sister   . Hypertension Sister   . Stroke Sister   . Hypertension Sister   . Hypertension Sister   . Colon cancer Other        nephew  . Brain cancer Son        deceased  . Cancer Son        brain  . Thyroid cancer Other        niece  . Breast cancer Other   . Breast cancer Other   . Breast cancer Other   . Colon polyps Neg Hx   . Esophageal cancer Neg Hx   . Rectal cancer Neg Hx   . Stomach cancer Neg Hx      Current Outpatient Medications:  .  budesonide (ENTOCORT EC) 3 MG 24 hr capsule, Take 1 capsule (3 mg total) by mouth daily., Disp: 30 capsule, Rfl: 3 .  Calcium Carbonate-Vitamin D (CALCIUM 600+D) 600-200 MG-UNIT TABS, Take 1 tablet by mouth daily. Take with a meal (Patient taking differently: Take 1-2 tablets by mouth daily. Take with a meal), Disp: , Rfl: 0 .  clonazePAM (KLONOPIN) 0.5 MG tablet, Take 0.5-1 tablets (0.25-0.5 mg total) by mouth 2 (two) times daily as needed for anxiety., Disp: 180 tablet, Rfl: 0 .  ezetimibe (ZETIA) 10 MG tablet, Take 1 tablet (10 mg total) by mouth daily., Disp: 90 tablet, Rfl: 1 .  fexofenadine (ALLEGRA) 180 MG tablet, Take 180 mg by mouth daily., Disp: , Rfl:  .  fluticasone (FLONASE) 50 MCG/ACT nasal spray, Place 2 sprays into both nostrils daily., Disp: 16 g, Rfl: 6 .  hydrocortisone (PROCTOSOL HC) 2.5 % rectal cream, Place 1 application rectally 2 (two) times daily., Disp: 30 g, Rfl: 0 .  lisinopril (ZESTRIL) 20 MG tablet, Take 1 tablet (20 mg  total) by mouth daily., Disp: 90 tablet, Rfl: 3 .  mesalamine (LIALDA) 1.2 g EC tablet, Take 2 tablets (2.4 g total) by mouth daily., Disp: 60 tablet, Rfl: 3 .  potassium chloride (K-DUR) 10 MEQ tablet, TAKE ONE (1)  TABLET EACH DAY, Disp: 90 tablet, Rfl: 1 .  Wheat Dextrin (BENEFIBER) POWD, Take 1 Dose by mouth daily as needed. , Disp: , Rfl:   Review of Systems:  Constitutional: Denies fever, chills, diaphoresis, appetite change and fatigue.  HEENT: Denies photophobia, eye pain, redness, hearing loss, ear pain, sore throat,  sneezing, mouth sores, trouble swallowing, neck pain, neck stiffness and tinnitus.   Respiratory: Denies SOB, DOE, cough, chest tightness,  and wheezing.   Cardiovascular: Denies chest pain, palpitations and leg swelling.  Gastrointestinal: Denies nausea, vomiting, abdominal pain, diarrhea, constipation, blood in stool and abdominal distention.  Genitourinary: Denies dysuria, urgency, frequency, hematuria, flank pain and difficulty urinating.  Endocrine: Denies: hot or cold intolerance, sweats, changes in hair or nails, polyuria, polydipsia. Musculoskeletal: Denies myalgias, back pain, joint swelling, arthralgias and gait problem.  Skin: Denies pallor, rash and wound.  Neurological: Denies dizziness, seizures, syncope, weakness, light-headedness, numbness and headaches.  Hematological: Denies adenopathy. Easy bruising, personal or family bleeding history  Psychiatric/Behavioral: Denies suicidal ideation, mood changes, confusion, nervousness, sleep disturbance and agitation    Physical Exam: Vitals:   08/16/19 1047  BP: (!) 180/80  Pulse: 71  Temp: 97.8 F (36.6 C)  TempSrc: Temporal  SpO2: 97%  Weight: 126 lb (57.2 kg)    Body mass index is 23.81 kg/m.   Constitutional: NAD, calm, comfortable Eyes: PERRL, lids and conjunctivae normal, wears corrective lenses ENMT: Mucous membranes are moist.  Tympanic membrane is pearly white, no erythema or  bulging. Neck: normal, supple, no masses, no thyromegaly Respiratory: clear to auscultation bilaterally, no wheezing, no crackles. Normal respiratory effort. No accessory muscle use.  Cardiovascular: Regular rate and rhythm, no murmurs / rubs / gallops. No extremity edema.  Neurologic: Grossly intact and nonfocal, involuntary tremor of head and neck noticed Psychiatric: Normal judgment and insight. Alert and oriented x 3. Normal mood.    Impression and Plan:  Essential tremor  - Plan: Ambulatory referral to Neurology  Hyperlipidemia, unspecified hyperlipidemia type -Last LDL was 178 in December 2020, she is on Zetia 10 mg, has severe statin intolerance. -Recheck lipids when she returns for physical.  Essential hypertension -Despite elevated blood pressure in office today, home measurements do note mostly normal blood pressures, continue current management.  Generalized anxiety disorder -Klonopin that she takes for tremors helps, she does not feel anxious today.  Hearing loss, unspecified hearing loss type, unspecified laterality  - Plan: Ambulatory referral to Audiology    Patient Instructions  -Nice seeing you today!!  -Referrals to audiology and neurology today.  -See you back in 6 months for your annual physical. Please come in fasting that day.     Lelon Frohlich, MD [facility]

## 2019-08-16 NOTE — Telephone Encounter (Signed)
Noted.  Information given to Deb.

## 2019-08-22 ENCOUNTER — Other Ambulatory Visit: Payer: Self-pay | Admitting: Family Medicine

## 2019-08-22 DIAGNOSIS — E876 Hypokalemia: Secondary | ICD-10-CM

## 2019-08-23 ENCOUNTER — Telehealth: Payer: Self-pay | Admitting: Internal Medicine

## 2019-08-23 DIAGNOSIS — E876 Hypokalemia: Secondary | ICD-10-CM

## 2019-08-23 NOTE — Telephone Encounter (Signed)
The patient is needing a refill on   potassium chloride (K-DUR) 10 MEQ tablet    Sent to:   [facility] [address], [place] Phone:  [phone]  Fax:  [phone]

## 2019-08-23 NOTE — Telephone Encounter (Signed)
Ok to refill 

## 2019-08-24 ENCOUNTER — Encounter: Payer: Self-pay | Admitting: Neurology

## 2019-08-24 MED ORDER — POTASSIUM CHLORIDE ER 10 MEQ PO TBCR: EXTENDED_RELEASE_TABLET | ORAL | 1 refills | Status: DC

## 2019-08-24 NOTE — Telephone Encounter (Signed)
Refill sent.

## 2019-08-24 NOTE — Addendum Note (Signed)
Addended by: Westley Hummer B on: 08/24/2019 05:08 PM   Modules accepted: Orders

## 2019-09-25 ENCOUNTER — Ambulatory Visit: Payer: Medicare PPO | Admitting: Neurology

## 2019-09-25 ENCOUNTER — Telehealth: Payer: Self-pay | Admitting: Internal Medicine

## 2019-09-25 DIAGNOSIS — I10 Essential (primary) hypertension: Secondary | ICD-10-CM

## 2019-09-25 NOTE — Telephone Encounter (Signed)
Message forwarded to PCP

## 2019-09-25 NOTE — Telephone Encounter (Signed)
Pt is calling in stating that she needs a MRI for her head having shooting pain and tightness off and on.  Pt was transferred to the Triage Nurse.

## 2019-09-26 MED ORDER — LISINOPRIL 20 MG PO TABS: 20.0000 mg | ORAL_TABLET | Freq: Every day | ORAL | 1 refills | Status: DC

## 2019-09-26 NOTE — Telephone Encounter (Signed)
Needs OV to assess before imaging can be ordered.

## 2019-09-26 NOTE — Addendum Note (Signed)
Addended by: Westley Hummer B on: 09/26/2019 10:38 AM   Modules accepted: Orders

## 2019-09-26 NOTE — Telephone Encounter (Signed)
Appointment scheduled.

## 2019-09-27 ENCOUNTER — Other Ambulatory Visit: Payer: Self-pay

## 2019-09-28 ENCOUNTER — Ambulatory Visit (INDEPENDENT_AMBULATORY_CARE_PROVIDER_SITE_OTHER): Payer: Medicare PPO | Admitting: Internal Medicine

## 2019-09-28 ENCOUNTER — Encounter: Payer: Self-pay | Admitting: Internal Medicine

## 2019-09-28 VITALS — BP 180/80 | HR 77 | Temp 97.7°F | Wt 121.4 lb

## 2019-09-28 DIAGNOSIS — F411 Generalized anxiety disorder: Secondary | ICD-10-CM

## 2019-09-28 DIAGNOSIS — H6593 Unspecified nonsuppurative otitis media, bilateral: Secondary | ICD-10-CM

## 2019-09-28 DIAGNOSIS — R251 Tremor, unspecified: Secondary | ICD-10-CM

## 2019-09-28 DIAGNOSIS — J3089 Other allergic rhinitis: Secondary | ICD-10-CM | POA: Diagnosis not present

## 2019-09-28 DIAGNOSIS — I10 Essential (primary) hypertension: Secondary | ICD-10-CM

## 2019-09-28 MED ORDER — CLONAZEPAM 0.5 MG PO TABS: 0.2500 mg | ORAL_TABLET | Freq: Two times a day (BID) | ORAL | 0 refills | Status: DC | PRN

## 2019-09-28 MED ORDER — FLUTICASONE PROPIONATE 50 MCG/ACT NA SUSP: 2.0000 | Freq: Every day | NASAL | 6 refills | Status: DC

## 2019-09-28 NOTE — Progress Notes (Signed)
Established Patient Office Visit     This visit occurred during the SARS-CoV-2 public health emergency.  Safety protocols were in place, including screening questions prior to the visit, additional usage of staff PPE, and extensive cleaning of exam room while observing appropriate contact time as indicated for disinfecting solutions.    CC/Reason for Visit: Discuss several acute concerns  HPI: Rachel Walton is a 86 y.o. female who is coming in today for the above mentioned reasons.  She is here today to discuss a few issues:  1.  She just got new hearing aids, she feels that they bother her and would like me to look at a spot behind her right ear where she feels like the hearing aid has irritated the skin.  2 we had referred her to neurology for her benign essential tremor.  She has been on Klonopin for this.  She feels like Klonopin is no longer working.  She refused referral that was placed for her last visit because "that neurologist was a DO and I do not trust DOs".  3.  She had sent a message via MyChart requesting an MRI due to headaches and I wanted to bring her in today to discuss this.  When I broached the subject of her headaches she states that she does not have these, she gets at times sharp shooting pains around her head and neck area, she is known to have cervical degenerative disc disease and has been getting cervical injections.     Past Medical/Surgical History: Past Medical History:  Diagnosis Date  . Acute gastric ulcer   . Allergy   . Anxiety   . Atrophic vaginitis   . Barrett's esophagus   . Basal cell carcinoma of nose     under nose  . Cataract   . Depression   . Diverticulosis   . Esophageal stricture   . Esophageal ulcer   . Fatty liver   . GERD (gastroesophageal reflux disease)   . Heart murmur   . Hiatal hernia   . Hx of adenomatous colonic polyps   . Hyperlipidemia   . Hypertension   . Hypokalemia   . IBS (irritable bowel syndrome)   .  Kyphosis   . Lymphocytic colitis   . Macular degeneration    "?wet or dry; ? both eyes"  . 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 Osteopenia of the elderly   . Pneumonia X 1  . Postmenopausal   . Recurrent UTI   . 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 Tremor   . Vitamin D deficiency     Past Surgical History:  Procedure Laterality Date  . ABDOMINAL HYSTERECTOMY  1980  . APPENDECTOMY    . BACK SURGERY    . BREAST BIOPSY Left   . CATARACT EXTRACTION W/ INTRAOCULAR LENS  IMPLANT, BILATERAL Bilateral   . COLONOSCOPY    . ESOPHAGOGASTRODUODENOSCOPY (EGD) WITH ESOPHAGEAL DILATION  X 1  . EYE SURGERY    . LUMBAR DISC SURGERY     "fragmented disc"  . POLYPECTOMY    . SQUAMOUS CELL CARCINOMA EXCISION     nose tip    Social History:  reports that she has never smoked. She has never used smokeless tobacco. She reports that she does not drink alcohol or use drugs.  Allergies: Allergies  Allergen Reactions  . Alendronate Sodium Other (See Comments)    Caused chest pain  . Lexapro [Escitalopram Oxalate] Other (  See Comments)    Makes lymphangitic colitis worse  . Amoxicillin Other (See Comments)    diarrhea  . Aspirin Other (See Comments)    Per the patient, made the gastric ulcer worse.  . Cephalexin   . Clindamycin/Lincomycin   . Keflex [Cephalexin]   . Levofloxacin Other (See Comments)    Insomnia and headache  . Prednisone Other (See Comments)    Reaction is unknown..then states nervousness  . Sudafed [Pseudoephedrine Hcl] Other (See Comments)    insomnia  . Sulfa Antibiotics Hives  . Toprol Xl [Metoprolol Succinate] Other (See Comments)    Unknown reaction  . Trimethoprim     Unknown reaction  . Zithromax [Azithromycin] Rash    Family History:  Family History  Problem Relation Age of Onset  . Stroke Mother   . Heart disease Father        MI  . Heart attack Father   . Cancer Sister        Brain tumor  . Hypertension Sister   . Osteoporosis  Sister   . Colon cancer Brother   . Heart disease Brother   . Diabetes Son 8  . Diabetes Maternal Uncle   . Diabetes Son 58  . Hypertension Sister   . Breast cancer Sister 48  . Hypertension Sister   . Cancer Sister        breast  . Osteoporosis Sister   . Hypertension Sister   . Hypertension Sister   . Stroke Sister   . Hypertension Sister   . Hypertension Sister   . Colon cancer Other        nephew  . Brain cancer Son        deceased  . Cancer Son        brain  . Thyroid cancer Other        niece  . Breast cancer Other   . Breast cancer Other   . Breast cancer Other   . Colon polyps Neg Hx   . Esophageal cancer Neg Hx   . Rectal cancer Neg Hx   . Stomach cancer Neg Hx      Current Outpatient Medications:  .  clonazePAM (KLONOPIN) 0.5 MG tablet, Take 0.5-1 tablets (0.25-0.5 mg total) by mouth 2 (two) times daily as needed for anxiety., Disp: 180 tablet, Rfl: 0 .  fexofenadine (ALLEGRA) 180 MG tablet, Take 180 mg by mouth daily., Disp: , Rfl:  .  fluticasone (FLONASE) 50 MCG/ACT nasal spray, Place 2 sprays into both nostrils daily., Disp: 16 g, Rfl: 6 .  lisinopril (ZESTRIL) 20 MG tablet, Take 1 tablet (20 mg total) by mouth daily., Disp: 90 tablet, Rfl: 1 .  mesalamine (LIALDA) 1.2 g EC tablet, Take 2 tablets (2.4 g total) by mouth daily., Disp: 60 tablet, Rfl: 3 .  OVER THE COUNTER MEDICATION, Vitamin D-3  2000 units daily, Disp: , Rfl:  .  OVER THE COUNTER MEDICATION, Stress B with Zinc, Disp: , Rfl:  .  potassium chloride (KLOR-CON) 10 MEQ tablet, TAKE ONE (1) TABLET EACH DAY, Disp: 90 tablet, Rfl: 1 .  vitamin B-12 (CYANOCOBALAMIN) 1000 MCG tablet, Take 1,000 mcg by mouth daily., Disp: , Rfl:  .  Wheat Dextrin (BENEFIBER) POWD, Take 1 Dose by mouth daily as needed. , Disp: , Rfl:   Review of Systems:  Constitutional: Denies fever, chills, diaphoresis, appetite change and fatigue.  HEENT: Denies photophobia, eye pain, redness, hearing loss, ear pain, congestion,  sore throat, rhinorrhea, sneezing, mouth sores,  trouble swallowing, neck pain, neck stiffness and tinnitus.   Respiratory: Denies SOB, DOE, cough, chest tightness,  and wheezing.   Cardiovascular: Denies chest pain, palpitations and leg swelling.  Gastrointestinal: Denies nausea, vomiting, abdominal pain, diarrhea, constipation, blood in stool and abdominal distention.  Genitourinary: Denies dysuria, urgency, frequency, hematuria, flank pain and difficulty urinating.  Endocrine: Denies: hot or cold intolerance, sweats, changes in hair or nails, polyuria, polydipsia. Musculoskeletal: Denies myalgias, back pain, joint swelling, arthralgias and gait problem.  Skin: Denies pallor, rash and wound.  Neurological: Denies dizziness, seizures, syncope, weakness, light-headedness, numbness and headaches.  Hematological: Denies adenopathy. Easy bruising, personal or family bleeding history  Psychiatric/Behavioral: Denies suicidal ideation, mood changes, confusion, nervousness, sleep disturbance and agitation    Physical Exam: Vitals:   09/28/19 1012  BP: (!) 180/80  Pulse: 77  Temp: 97.7 F (36.5 C)  TempSrc: Temporal  SpO2: 96%  Weight: 121 lb 6.4 oz (55.1 kg)    Body mass index is 22.94 kg/m.   Constitutional: NAD, calm, comfortable, constant tremor of her head and neck are noticed. Eyes: PERRL, lids and conjunctivae normal, wears corrective lenses ENMT: Mucous membranes are moist.  Skin behind the right ear is a little red, no laceration or excoriation. Respiratory: clear to auscultation bilaterally, no wheezing, no crackles. Normal respiratory effort. No accessory muscle use.  Cardiovascular: Regular rate and rhythm, no murmurs / rubs / gallops. No extremity edema.  Psychiatric: Normal judgment and insight. Alert and oriented x 3. Normal mood.    Impression and Plan:  Tremor  - Plan: clonazePAM (KLONOPIN) 0.5 MG tablet, Ambulatory referral to Neurology  Allergic rhinitis due to  fungal spores, unspecified seasonality  - Plan: fluticasone (FLONASE) 50 MCG/ACT nasal spray  Essential hypertension -Blood pressure is elevated today.  She states she was "worked up" because she was running late and thinks her elevated blood pressures may be due to this.  She will do ambulatory monitoring and bring her chart at her next visit.     Lelon Frohlich, MD [facility]

## 2019-09-29 ENCOUNTER — Emergency Department (HOSPITAL_COMMUNITY): Payer: Medicare PPO

## 2019-09-29 ENCOUNTER — Encounter (HOSPITAL_COMMUNITY): Payer: Self-pay | Admitting: *Deleted

## 2019-09-29 ENCOUNTER — Emergency Department (HOSPITAL_COMMUNITY)
Admission: EM | Admit: 2019-09-29 | Discharge: 2019-09-29 | Disposition: A | Discharge status: Home / Self Care | Payer: Medicare PPO | Attending: Emergency Medicine | Admitting: Emergency Medicine

## 2019-09-29 DIAGNOSIS — I10 Essential (primary) hypertension: Secondary | ICD-10-CM | POA: Diagnosis not present

## 2019-09-29 DIAGNOSIS — Z79899 Other long term (current) drug therapy: Secondary | ICD-10-CM | POA: Insufficient documentation

## 2019-09-29 DIAGNOSIS — Y999 Unspecified external cause status: Secondary | ICD-10-CM | POA: Insufficient documentation

## 2019-09-29 DIAGNOSIS — S6991XA Unspecified injury of right wrist, hand and finger(s), initial encounter: Secondary | ICD-10-CM | POA: Diagnosis not present

## 2019-09-29 DIAGNOSIS — Y929 Unspecified place or not applicable: Secondary | ICD-10-CM | POA: Insufficient documentation

## 2019-09-29 DIAGNOSIS — W010XXA Fall on same level from slipping, tripping and stumbling without subsequent striking against object, initial encounter: Secondary | ICD-10-CM | POA: Diagnosis not present

## 2019-09-29 DIAGNOSIS — S01511A Laceration without foreign body of lip, initial encounter: Secondary | ICD-10-CM | POA: Insufficient documentation

## 2019-09-29 DIAGNOSIS — R519 Headache, unspecified: Secondary | ICD-10-CM | POA: Diagnosis not present

## 2019-09-29 DIAGNOSIS — Z23 Encounter for immunization: Secondary | ICD-10-CM | POA: Diagnosis not present

## 2019-09-29 DIAGNOSIS — Y939 Activity, unspecified: Secondary | ICD-10-CM | POA: Insufficient documentation

## 2019-09-29 DIAGNOSIS — T07XXXA Unspecified multiple injuries, initial encounter: Secondary | ICD-10-CM

## 2019-09-29 LAB — COMPREHENSIVE METABOLIC PANEL
ALT: 18 U/L (ref 0–44)
AST: 23 U/L (ref 15–41)
Albumin: 3.7 g/dL (ref 3.5–5.0)
Alkaline Phosphatase: 51 U/L (ref 38–126)
Anion gap: 10 (ref 5–15)
BUN: 9 mg/dL (ref 8–23)
CO2: 27 mmol/L (ref 22–32)
Calcium: 8.8 mg/dL — ABNORMAL LOW (ref 8.9–10.3)
Chloride: 104 mmol/L (ref 98–111)
Creatinine, Ser: 0.69 mg/dL (ref 0.44–1.00)
GFR calc Af Amer: >60 mL/min (ref >60)
GFR calc non Af Amer: >60 mL/min (ref >60)
Glucose, Bld: 100 mg/dL — ABNORMAL HIGH (ref 70–99)
Potassium: 3.1 mmol/L — ABNORMAL LOW (ref 3.5–5.1)
Sodium: 141 mmol/L (ref 135–145)
Total Bilirubin: 1 mg/dL (ref 0.3–1.2)
Total Protein: 6.4 g/dL — ABNORMAL LOW (ref 6.5–8.1)

## 2019-09-29 LAB — CBC WITH DIFFERENTIAL/PLATELET
Abs Immature Granulocytes: 0.01 10*3/uL (ref 0.00–0.07)
Basophils Absolute: 0.1 10*3/uL (ref 0.0–0.1)
Basophils Relative: 1 %
Eosinophils Absolute: 0.1 10*3/uL (ref 0.0–0.5)
Eosinophils Relative: 2 %
HCT: 40.2 % (ref 36.0–46.0)
Hemoglobin: 13.1 g/dL (ref 12.0–15.0)
Immature Granulocytes: 0 %
Lymphocytes Relative: 37 %
Lymphs Abs: 2.4 10*3/uL (ref 0.7–4.0)
MCH: 29.6 pg (ref 26.0–34.0)
MCHC: 32.6 g/dL (ref 30.0–36.0)
MCV: 91 fL (ref 80.0–100.0)
Monocytes Absolute: 0.5 10*3/uL (ref 0.1–1.0)
Monocytes Relative: 7 %
Neutro Abs: 3.5 10*3/uL (ref 1.7–7.7)
Neutrophils Relative %: 53 %
Platelets: 239 10*3/uL (ref 150–400)
RBC: 4.42 MIL/uL (ref 3.87–5.11)
RDW: 12.6 % (ref 11.5–15.5)
WBC: 6.6 10*3/uL (ref 4.0–10.5)
nRBC: 0 % (ref 0.0–0.2)

## 2019-09-29 MED ORDER — LIDOCAINE HCL (PF) 1 % IJ SOLN
5.0000 mL | Freq: Once | INTRAMUSCULAR | Status: AC
  Administered 2019-09-29: 5 mL via INTRADERMAL
  Filled 2019-09-29: qty 30

## 2019-09-29 MED ORDER — LIDOCAINE-EPINEPHRINE-TETRACAINE (LET) TOPICAL GEL
3.0000 mL | Freq: Once | TOPICAL | Status: AC
  Administered 2019-09-29: 3 mL via TOPICAL
  Filled 2019-09-29: qty 3

## 2019-09-29 MED ORDER — TETANUS-DIPHTH-ACELL PERTUSSIS 5-2.5-18.5 LF-MCG/0.5 IM SUSP
0.5000 mL | Freq: Once | INTRAMUSCULAR | Status: AC
  Administered 2019-09-29: 0.5 mL via INTRAMUSCULAR
  Filled 2019-09-29: qty 0.5

## 2019-09-29 NOTE — Discharge Instructions (Addendum)
WOUND CARE  Do not apply any ointments or creams to the wound while stitches/staples are in place, as this may cause delayed healing.  Seek medical careif you experience any of the following signs of infection: Swelling, redness, pus drainage, streaking, fever >101.0 F  Seek care if you experience excessive bleeding that does not stop after 15-20 minutes of constant, firm pressure.

## 2019-09-29 NOTE — ED Provider Notes (Signed)
[facility] EMERGENCY DEPARTMENT Provider Note   CSN: [id number] Arrival date & time: 09/29/19  1356     History Chief Complaint  Patient presents with  . Fall  . Facial Injury    ILO HARTE is a 86 y.o. female who presents for mechanical fall. Patient was working outside when her foot got caught and she fell forward.  She hit her lip on the pavement.  She has pain in her right knee and right wrist and was unable to get up.  She denies any teeth pain, loss of consciousness.  She is not on any blood thinners.  She denies neck pain or upper extremity weakness.  HPI     Past Medical History:  Diagnosis Date  . Acute gastric ulcer   . Allergy   . Anxiety   . Atrophic vaginitis   . Barrett's esophagus   . Basal cell carcinoma of nose     under nose  . Cataract   . Depression   . Diverticulosis   . Esophageal stricture   . Esophageal ulcer   . Fatty liver   . GERD (gastroesophageal reflux disease)   . Heart murmur   . Hiatal hernia   . Hx of adenomatous colonic polyps   . Hyperlipidemia   . Hypertension   . Hypokalemia   . IBS (irritable bowel syndrome)   . Kyphosis   . Lymphocytic colitis   . Macular degeneration    "?wet or dry; ? both eyes"  . 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 Osteopenia of the elderly   . Pneumonia X 1  . Postmenopausal   . Recurrent UTI   . 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 Tremor   . Vitamin D deficiency     Patient Active Problem List   Diagnosis Date Noted  . Osteopenia with high risk of fracture 10/21/2016  . Essential tremor 05/20/2016  . Age-related macular degeneration, dry, both eyes 04/07/2016  . Basal cell carcinoma of nose 02/05/2016  . Lymphocytic colitis 12/20/2015  . Barrett's esophagus 12/20/2015  . 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 Depression 11/07/2015  . Chest pain 10/09/2015  . Hypokalemia 10/09/2015  . Anemia 10/09/2015  . Blepharoptosis, acquired, 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  .  Esophageal stricture 12/11/2013  . Allergic rhinitis due to pollen 12/11/2013  . Essential hypertension 12/11/2013  . Amblyopia ex anopsia of right eye 12/07/2013  . Generalized anxiety disorder 08/21/2013  . Gall stones 06/15/2013  . Degenerative drusen 05/05/2013  . Hyperlipemia 11/21/2012  . Postmenopausal   . Colon polyp   . Osteopenia of the elderly   . Kyphosis   . Recurrent UTI   . Atrophic vaginitis   . Diverticulosis     Past Surgical History:  Procedure Laterality Date  . ABDOMINAL HYSTERECTOMY  1980  . APPENDECTOMY    . BACK SURGERY    . BREAST BIOPSY Left   . CATARACT EXTRACTION W/ INTRAOCULAR LENS  IMPLANT, BILATERAL Bilateral   . COLONOSCOPY    . ESOPHAGOGASTRODUODENOSCOPY (EGD) WITH ESOPHAGEAL DILATION  X 1  . EYE SURGERY    . LUMBAR DISC SURGERY     "fragmented disc"  . POLYPECTOMY    . SQUAMOUS CELL CARCINOMA EXCISION     nose tip     OB History   No obstetric history on file.     Family History  Problem Relation Age of Onset  . Stroke Mother   . Heart disease Father  MI  . Heart attack Father   . Cancer Sister        Brain tumor  . Hypertension Sister   . Osteoporosis Sister   . Colon cancer Brother   . Heart disease Brother   . Diabetes Son 8  . Diabetes Maternal Uncle   . Diabetes Son 31  . Hypertension Sister   . Breast cancer Sister 61  . Hypertension Sister   . Cancer Sister        breast  . Osteoporosis Sister   . Hypertension Sister   . Hypertension Sister   . Stroke Sister   . Hypertension Sister   . Hypertension Sister   . Colon cancer Other        nephew  . Brain cancer Son        deceased  . Cancer Son        brain  . Thyroid cancer Other        niece  . Breast cancer Other   . Breast cancer Other   . Breast cancer Other   . Colon polyps Neg Hx   . Esophageal cancer Neg Hx   . Rectal cancer Neg Hx   . Stomach cancer Neg Hx     Social History   Tobacco Use  . Smoking status: Never Smoker  .  Smokeless tobacco: Never Used  Substance Use Topics  . Alcohol use: No    Alcohol/week: 0.0 standard drinks  . Drug use: No    Home Medications Prior to Admission medications   Medication Sig Start Date End Date Taking? Authorizing Provider  clonazePAM (KLONOPIN) 0.5 MG tablet Take 0.5-1 tablets (0.25-0.5 mg total) by mouth 2 (two) times daily as needed for anxiety. 09/28/19   Isaac Bliss, Rayford Halsted, MD  fexofenadine (ALLEGRA) 180 MG tablet Take 180 mg by mouth daily.    [provider]  fluticasone (FLONASE) 50 MCG/ACT nasal spray Place 2 sprays into both nostrils daily. 09/28/19   Isaac Bliss, Rayford Halsted, MD  lisinopril (ZESTRIL) 20 MG tablet Take 1 tablet (20 mg total) by mouth daily. 09/26/19   Isaac Bliss, Rayford Halsted, MD  mesalamine (LIALDA) 1.2 g EC tablet Take 2 tablets (2.4 g total) by mouth daily. 03/07/19   Pyrtle, Lajuan Lines, MD  OVER THE COUNTER MEDICATION Vitamin D-3  2000 units daily    [provider]  OVER THE COUNTER MEDICATION Stress B with Zinc    [provider]  potassium chloride (KLOR-CON) 10 MEQ tablet TAKE ONE (1) TABLET EACH DAY 08/24/19   Isaac Bliss, Rayford Halsted, MD  vitamin B-12 (CYANOCOBALAMIN) 1000 MCG tablet Take 1,000 mcg by mouth daily.    [provider]  Wheat Dextrin (BENEFIBER) POWD Take 1 Dose by mouth daily as needed.     [provider]    Allergies    Alendronate sodium, Lexapro [escitalopram oxalate], Amoxicillin, Aspirin, Cephalexin, Clindamycin/lincomycin, Keflex [cephalexin], Levofloxacin, Prednisone, Sudafed [pseudoephedrine hcl], Sulfa antibiotics, Toprol xl [metoprolol succinate], Trimethoprim, and Zithromax [azithromycin]  Review of Systems   Review of Systems Ten systems reviewed and are negative for acute change, except as noted in the HPI.   Physical Exam Updated Vital Signs BP (!) 162/82 (BP Location: Right Arm)   Pulse 78   Temp 98.6 F (37 C) (Oral)   Resp 18   Ht 5\' 1"  (1.549  m)   SpO2 99%   BMI 22.94 kg/m   Physical Exam Vitals and nursing note reviewed.  Constitutional:  General: She is not in acute distress.    Appearance: She is well-developed. She is not diaphoretic.  HENT:     Head: Normocephalic and atraumatic.     Mouth/Throat:     Comments: Laceration of the mucosal surface of the lip, does not cross the vermilion border Teeth are firmly intact.  No malocclusion Eyes:     General: No scleral icterus.    Conjunctiva/sclera: Conjunctivae normal.  Cardiovascular:     Rate and Rhythm: Normal rate and regular rhythm.     Heart sounds: Normal heart sounds. No murmur. No friction rub. No gallop.   Pulmonary:     Effort: Pulmonary effort is normal. No respiratory distress.     Breath sounds: Normal breath sounds.  Abdominal:     General: Bowel sounds are normal. There is no distension.     Palpations: Abdomen is soft. There is no mass.     Tenderness: There is no abdominal tenderness. There is no guarding.  Musculoskeletal:     Cervical back: Normal range of motion.     Comments: Right wrist with swelling over the fourth and fifth metacarpal.  Pain with movement of the fourth and fifth digits.  Pain with any movement of the wrist.  Normal sensation, cap refill less than 2 seconds Pain along the medial side of the right knee.  No pain with passive flexion extension.  Ligaments intact.  No pain in the hip with internal and external rotation  Skin:    General: Skin is warm and dry.     Comments: Abrasions to the bilateral knees  Neurological:     Mental Status: She is alert and oriented to person, place, and time.  Psychiatric:        Behavior: Behavior normal.     ED Results / Procedures / Treatments   Labs (all labs ordered are listed, but only abnormal results are displayed) Labs Reviewed  COMPREHENSIVE METABOLIC PANEL - Abnormal; Notable for the following components:      Result Value   Potassium 3.1 (*)    Glucose, Bld 100 (*)     Calcium 8.8 (*)    Total Protein 6.4 (*)    All other components within normal limits  CBC WITH DIFFERENTIAL/PLATELET    EKG None  Radiology DG Wrist Complete Right  Result Date: 09/29/2019 CLINICAL DATA:  Pain following fall EXAM: RIGHT WRIST - COMPLETE 3+ VIEW COMPARISON:  Right hand radiographs Sep 29, 2019 FINDINGS: Frontal, oblique, lateral, and ulnar deviation scaphoid images were obtained. No fracture or dislocation. There is narrowing of the scaphotrapezial and first carpal-metacarpal joints. Other joint spaces appear unremarkable. No erosive change. IMPRESSION: Osteoarthritic change laterally.  No fracture or dislocation. Electronically Signed   By: Lowella Grip III M.D.   On: 09/29/2019 15:36   DG Hand Complete Right  Result Date: 09/29/2019 CLINICAL DATA:  Pain following fall EXAM: RIGHT HAND - COMPLETE 3+ VIEW COMPARISON:  None. FINDINGS: Frontal, oblique, and lateral views were obtained. There is no fracture or dislocation. There is moderate osteoarthritic change in the scaphotrapezial joint and first carpal-metacarpal joints. There is relatively mild joint space narrowing involving all PIP and DIP joints as well as the first IP joint. Calcification in the first IP joint is likely of arthropathic etiology. No erosive change. IMPRESSION: Osteoarthritic change in multiple distal joints as well as in the scaphotrapezial and first carpal-metacarpal joints. No fracture or dislocation evident. Electronically Signed   By: Lowella Grip III M.D.  On: 09/29/2019 15:35    Procedures .Marland KitchenLaceration Repair  Date/Time: 09/29/2019 6:11 PM Performed by: Margarita Mail, PA-C Authorized by: Margarita Mail, PA-C   Consent:    Consent obtained:  Verbal   Consent given by:  Patient   Risks discussed:  Infection, need for additional repair, pain, poor cosmetic result and poor wound healing   Alternatives discussed:  No treatment and delayed treatment Universal protocol:    Procedure  explained and questions answered to patient or proxy's satisfaction: yes     Relevant documents present and verified: yes     Test results available and properly labeled: yes     Imaging studies available: yes     Required blood products, implants, devices, and special equipment available: yes     Site/side marked: yes     Immediately prior to procedure, a time out was called: yes     Patient identity confirmed:  Verbally with patient Anesthesia (see MAR for exact dosages):    Anesthesia method:  Local infiltration   Local anesthetic:  Lidocaine 1% w/o epi Laceration details:    Location:  Lip   Lip location:  Upper lip, full thickness   Vermilion border involved: no     Height of lip laceration:  Up to half vertical height   Length (cm):  1 Repair type:    Repair type:  Simple Pre-procedure details:    Preparation:  Patient was prepped and draped in usual sterile fashion Exploration:    Wound exploration: wound explored through full range of motion     Contaminated: no   Treatment:    Irrigation solution:  Sterile water   Irrigation method:  Pressure wash Skin repair:    Repair method:  Sutures   Suture size:  5-0   Suture material:  Fast-absorbing gut   Suture technique:  Simple interrupted   Number of sutures:  1 Approximation:    Approximation:  Close Post-procedure details:    Dressing:  Open (no dressing)   Patient tolerance of procedure:  Tolerated well, no immediate complications   (including critical care time)  Medications Ordered in ED Medications  Tdap (BOOSTRIX) injection 0.5 mL (0.5 mLs Intramuscular Given 09/29/19 1509)  lidocaine-EPINEPHrine-tetracaine (LET) topical gel (3 mLs Topical Given 09/29/19 1509)    ED Course  I have reviewed the triage vital signs and the nursing notes.  Pertinent labs & imaging results that were available during my care of the patient were reviewed by me and considered in my medical decision making (see chart for  details).    MDM Rules/Calculators/A&P                     86 year old female who presents emergency department chief complaint of mechanical fall.  I personally ordered interpreted reviewed labs which show some mild hypokalemia, CBC without abnormality.  I personally reviewed the images of the CT head and C-spine which also showed no acute abnormalities.  The images of the right knee, right wrist and right hand also show no acute abnormalities today.  Patient however clinically presents like a fracture of the hand and due to this I feel it be prudent to place her in immobilization with an ulnar gutter splint and have her follow outpatient in 1 week with orthopedics.  The splint was placed by the tech and I reevaluated the limb after placement.  She had normal capillary refill and felt the splint was comfortable.  Patient has no dental injuries but feels like  her tooth "feels funny."  She did not have any apparent fractures or subluxations however we will have her follow closely with her dentist.  Patient appears otherwise appropriate for discharge at this time.  Final Clinical Impression(s) / ED Diagnoses Final diagnoses:  None    Rx / DC Orders ED Discharge Orders    None       Margarita Mail, PA-C 09/29/19 1814    Milton Ferguson, MD 10/01/19 1536

## 2019-09-29 NOTE — ED Triage Notes (Signed)
Fell at home in her driveway, multiple injuries.

## 2019-10-03 ENCOUNTER — Ambulatory Visit: Payer: Medicare PPO | Admitting: Internal Medicine

## 2019-10-05 ENCOUNTER — Ambulatory Visit: Payer: Medicare PPO | Admitting: Internal Medicine

## 2019-10-05 ENCOUNTER — Encounter: Payer: Self-pay | Admitting: Internal Medicine

## 2019-10-05 ENCOUNTER — Other Ambulatory Visit: Payer: Self-pay

## 2019-10-05 VITALS — BP 140/80 | HR 87 | Temp 97.1°F | Wt 118.2 lb

## 2019-10-05 DIAGNOSIS — Z09 Encounter for follow-up examination after completed treatment for conditions other than malignant neoplasm: Secondary | ICD-10-CM | POA: Diagnosis not present

## 2019-10-05 NOTE — Progress Notes (Signed)
Established Patient Office Visit     This visit occurred during the SARS-CoV-2 public health emergency.  Safety protocols were in place, including screening questions prior to the visit, additional usage of staff PPE, and extensive cleaning of exam room while observing appropriate contact time as indicated for disinfecting solutions.    CC/Reason for Visit: ED follow-up  HPI: Rachel Walton is a 86 y.o. female who is coming in today for the above mentioned reasons.  She saw me in the office on 5/20.  The day after that she was wearing some crocs and tripped forward hitting her right arm, forehead and lip.  She went to the emergency department.  They sutured her lip laceration and even though hand and wrist x-rays were negative, they decided to place her in a splint to immobilize.  She has follow-up with orthopedics, Dr. Gladstone Lighter, scheduled for tomorrow.   Past Medical/Surgical History: Past Medical History:  Diagnosis Date  . Acute gastric ulcer   . Allergy   . Anxiety   . Atrophic vaginitis   . Barrett's esophagus   . Basal cell carcinoma of nose     under nose  . Cataract   . Depression   . Diverticulosis   . Esophageal stricture   . Esophageal ulcer   . Fatty liver   . GERD (gastroesophageal reflux disease)   . Heart murmur   . Hiatal hernia   . Hx of adenomatous colonic polyps   . Hyperlipidemia   . Hypertension   . Hypokalemia   . IBS (irritable bowel syndrome)   . Kyphosis   . Lymphocytic colitis   . Macular degeneration    "?wet or dry; ? both eyes"  . 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 Osteopenia of the elderly   . Pneumonia X 1  . Postmenopausal   . Recurrent UTI   . 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 Tremor   . Vitamin D deficiency     Past Surgical History:  Procedure Laterality Date  . ABDOMINAL HYSTERECTOMY  1980  . APPENDECTOMY    . BACK SURGERY    . BREAST BIOPSY Left   . CATARACT EXTRACTION W/ INTRAOCULAR LENS   IMPLANT, BILATERAL Bilateral   . COLONOSCOPY    . ESOPHAGOGASTRODUODENOSCOPY (EGD) WITH ESOPHAGEAL DILATION  X 1  . EYE SURGERY    . LUMBAR DISC SURGERY     "fragmented disc"  . POLYPECTOMY    . SQUAMOUS CELL CARCINOMA EXCISION     nose tip    Social History:  reports that she has never smoked. She has never used smokeless tobacco. She reports that she does not drink alcohol or use drugs.  Allergies: Allergies  Allergen Reactions  . Alendronate Sodium Other (See Comments)    Caused chest pain  . Lexapro [Escitalopram Oxalate] Other (See Comments)    Makes lymphangitic colitis worse  . Amoxicillin Other (See Comments)    diarrhea  . Aspirin Other (See Comments)    Per the patient, made the gastric ulcer worse.  . Cephalexin   . Clindamycin/Lincomycin   . Keflex [Cephalexin]   . Levofloxacin Other (See Comments)    Insomnia and headache  . Prednisone Other (See Comments)    Reaction is unknown..then states nervousness  . Sudafed [Pseudoephedrine Hcl] Other (See Comments)    insomnia  . Sulfa Antibiotics Hives  . Toprol Xl [Metoprolol Succinate] Other (See Comments)    Unknown reaction  .  Trimethoprim     Unknown reaction  . Zithromax [Azithromycin] Rash    Family History:  Family History  Problem Relation Age of Onset  . Stroke Mother   . Heart disease Father        MI  . Heart attack Father   . Cancer Sister        Brain tumor  . Hypertension Sister   . Osteoporosis Sister   . Colon cancer Brother   . Heart disease Brother   . Diabetes Son 8  . Diabetes Maternal Uncle   . Diabetes Son 28  . Hypertension Sister   . Breast cancer Sister 30  . Hypertension Sister   . Cancer Sister        breast  . Osteoporosis Sister   . Hypertension Sister   . Hypertension Sister   . Stroke Sister   . Hypertension Sister   . Hypertension Sister   . Colon cancer Other        nephew  . Brain cancer Son        deceased  . Cancer Son        brain  . Thyroid cancer  Other        niece  . Breast cancer Other   . Breast cancer Other   . Breast cancer Other   . Colon polyps Neg Hx   . Esophageal cancer Neg Hx   . Rectal cancer Neg Hx   . Stomach cancer Neg Hx      Current Outpatient Medications:  .  clonazePAM (KLONOPIN) 0.5 MG tablet, Take 0.5-1 tablets (0.25-0.5 mg total) by mouth 2 (two) times daily as needed for anxiety., Disp: 180 tablet, Rfl: 0 .  fexofenadine (ALLEGRA) 180 MG tablet, Take 180 mg by mouth daily., Disp: , Rfl:  .  fluticasone (FLONASE) 50 MCG/ACT nasal spray, Place 2 sprays into both nostrils daily., Disp: 16 g, Rfl: 6 .  lisinopril (ZESTRIL) 20 MG tablet, Take 1 tablet (20 mg total) by mouth daily., Disp: 90 tablet, Rfl: 1 .  mesalamine (LIALDA) 1.2 g EC tablet, Take 2 tablets (2.4 g total) by mouth daily., Disp: 60 tablet, Rfl: 3 .  OVER THE COUNTER MEDICATION, Vitamin D-3  2000 units daily, Disp: , Rfl:  .  OVER THE COUNTER MEDICATION, Stress B with Zinc, Disp: , Rfl:  .  potassium chloride (KLOR-CON) 10 MEQ tablet, TAKE ONE (1) TABLET EACH DAY, Disp: 90 tablet, Rfl: 1 .  vitamin B-12 (CYANOCOBALAMIN) 1000 MCG tablet, Take 1,000 mcg by mouth daily., Disp: , Rfl:  .  Wheat Dextrin (BENEFIBER) POWD, Take 1 Dose by mouth daily as needed. , Disp: , Rfl:   Review of Systems:  Constitutional: Denies fever, chills, diaphoresis, appetite change and fatigue.  HEENT: Denies photophobia, eye pain, redness, hearing loss, ear pain, congestion, sore throat, rhinorrhea, sneezing, mouth sores, trouble swallowing, neck pain, neck stiffness and tinnitus.   Respiratory: Denies SOB, DOE, cough, chest tightness,  and wheezing.   Cardiovascular: Denies chest pain, palpitations and leg swelling.  Gastrointestinal: Denies nausea, vomiting, abdominal pain, diarrhea, constipation, blood in stool and abdominal distention.  Genitourinary: Denies dysuria, urgency, frequency, hematuria, flank pain and difficulty urinating.  Endocrine: Denies: hot or cold  intolerance, sweats, changes in hair or nails, polyuria, polydipsia. Musculoskeletal: Denies myalgias, back pain, joint swelling, arthralgias and gait problem.  Skin: Denies pallor, rash and wound.  Neurological: Denies dizziness, seizures, syncope, weakness, light-headedness, numbness and headaches.  Hematological: Denies adenopathy. Easy bruising, personal  or family bleeding history  Psychiatric/Behavioral: Denies suicidal ideation, mood changes, confusion, nervousness, sleep disturbance and agitation    Physical Exam: Vitals:   10/05/19 1429  BP: 140/80  Pulse: 87  Temp: (!) 97.1 F (36.2 C)  TempSrc: Temporal  SpO2: 99%  Weight: 118 lb 3.2 oz (53.6 kg)    Body mass index is 22.33 kg/m.   Constitutional: NAD, calm, comfortable Eyes: PERRL, lids and conjunctivae normal ENMT: Mucous membranes are moist.  Lip lesion is to the left of center and healing well. Psychiatric: Normal judgment and insight. Alert and oriented x 3. Normal mood.    Impression and Plan:  Hospital discharge follow-up [facility] records have been reviewed. -She has follow-up with orthopedics scheduled for tomorrow.    Lelon Frohlich, MD [facility]

## 2019-10-30 ENCOUNTER — Other Ambulatory Visit: Payer: Self-pay | Admitting: Internal Medicine

## 2019-12-20 ENCOUNTER — Other Ambulatory Visit: Payer: Self-pay | Admitting: Internal Medicine

## 2019-12-20 DIAGNOSIS — I10 Essential (primary) hypertension: Secondary | ICD-10-CM

## 2020-01-08 ENCOUNTER — Other Ambulatory Visit: Payer: Self-pay

## 2020-01-08 ENCOUNTER — Telehealth: Payer: Self-pay | Admitting: Internal Medicine

## 2020-01-08 ENCOUNTER — Emergency Department (HOSPITAL_COMMUNITY)
Admission: EM | Admit: 2020-01-08 | Discharge: 2020-01-08 | Discharge status: Against Medical Advice | Payer: Medicare PPO

## 2020-01-08 NOTE — ED Triage Notes (Signed)
Left prior to triage

## 2020-01-08 NOTE — Telephone Encounter (Signed)
I do not see a note in the chart from the triage nurse.  No answer at the pts home number.

## 2020-01-08 NOTE — Telephone Encounter (Signed)
Head feels tight where she fell about 4 mths ago and pain comes and go.  Patient has fell again on concrete since then.  She is requesting a call back.  She was transferred to nurse triage.

## 2020-01-09 ENCOUNTER — Telehealth: Payer: Self-pay | Admitting: *Deleted

## 2020-01-09 NOTE — Telephone Encounter (Signed)
Patient called nurse triage. Patient reports she fell a few months ago and hit head on concrete. having tightness in top of her head, and pain in different places around her head. Son and sister have died from a brain tumor. Should she get another scan, possible mri.   Patient went to ED yesterday but left after she had been waiting for 5 hours

## 2020-01-09 NOTE — Telephone Encounter (Signed)
Spoke with patient and she is "feeling a little better today".  Appointment scheduled with Dr Elease Hashimoto 01/10/20.

## 2020-01-10 ENCOUNTER — Other Ambulatory Visit: Payer: Self-pay

## 2020-01-10 ENCOUNTER — Ambulatory Visit: Payer: Medicare PPO | Admitting: Family Medicine

## 2020-01-10 ENCOUNTER — Encounter: Payer: Self-pay | Admitting: Family Medicine

## 2020-01-10 VITALS — BP 180/80 | HR 98 | Temp 98.1°F | Ht 61.0 in | Wt 118.0 lb

## 2020-01-10 DIAGNOSIS — I10 Essential (primary) hypertension: Secondary | ICD-10-CM

## 2020-01-10 DIAGNOSIS — R42 Dizziness and giddiness: Secondary | ICD-10-CM

## 2020-01-10 MED ORDER — AMLODIPINE BESYLATE 5 MG PO TABS: 5.0000 mg | ORAL_TABLET | Freq: Every day | ORAL | 5 refills | Status: DC

## 2020-01-10 NOTE — Progress Notes (Signed)
Established Patient Office Visit  Subjective:  Patient ID: Rachel Walton, female    DOB: [birth year]  Age: 86 y.o. MRN: [id number]  CC:  Chief Complaint  Patient presents with  . Dizziness  . Bloated    HPI ORLEAN HOLTROP presents for some nonspecific dizziness.  Apparently this has gone on for months.  She is somewhat of a poor historian and difficult to follow at times.  She apparently had fall back in May had CT head as well as CT neck at that time which showed no acute findings.  She denies any consistent headache.  No nausea or vomiting.  She has hypertension and takes lisinopril and states she is taking this regularly though she seems confused some at times with medications.  Denies any fever, chills, chest pain, flank pain.  She states that she occasionally feels bloated in her abdomen.  No localizing abdominal pain.  Appetite reportedly stable.  No vertigo symptoms.  She lives at home with her husband.  Recent labs and x-rays from ER visit back in May reviewed.  She had normal TSH and B12 back in December of last year  Past Medical History:  Diagnosis Date  . Acute gastric ulcer   . Allergy   . Anxiety   . Atrophic vaginitis   . Barrett's esophagus   . Basal cell carcinoma of nose     under nose  . Cataract   . Depression   . Diverticulosis   . Esophageal stricture   . Esophageal ulcer   . Fatty liver   . GERD (gastroesophageal reflux disease)   . Heart murmur   . Hiatal hernia   . Hx of adenomatous colonic polyps   . Hyperlipidemia   . Hypertension   . Hypokalemia   . IBS (irritable bowel syndrome)   . Kyphosis   . Lymphocytic colitis   . Macular degeneration    "?wet or dry; ? both eyes"  . 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 Osteopenia of the elderly   . Pneumonia X 1  . Postmenopausal   . Recurrent UTI   . 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 Tremor   . Vitamin D deficiency     Past Surgical History:  Procedure Laterality Date   . ABDOMINAL HYSTERECTOMY  1980  . APPENDECTOMY    . BACK SURGERY    . BREAST BIOPSY Left   . CATARACT EXTRACTION W/ INTRAOCULAR LENS  IMPLANT, BILATERAL Bilateral   . COLONOSCOPY    . ESOPHAGOGASTRODUODENOSCOPY (EGD) WITH ESOPHAGEAL DILATION  X 1  . EYE SURGERY    . LUMBAR DISC SURGERY     "fragmented disc"  . POLYPECTOMY    . SQUAMOUS CELL CARCINOMA EXCISION     nose tip    Family History  Problem Relation Age of Onset  . Stroke Mother   . Heart disease Father        MI  . Heart attack Father   . Cancer Sister        Brain tumor  . Hypertension Sister   . Osteoporosis Sister   . Colon cancer Brother   . Heart disease Brother   . Diabetes Son 8  . Diabetes Maternal Uncle   . Diabetes Son 88  . Hypertension Sister   . Breast cancer Sister 7  . Hypertension Sister   . Cancer Sister        breast  . Osteoporosis Sister   .  Hypertension Sister   . Hypertension Sister   . Stroke Sister   . Hypertension Sister   . Hypertension Sister   . Colon cancer Other        nephew  . Brain cancer Son        deceased  . Cancer Son        brain  . Thyroid cancer Other        niece  . Breast cancer Other   . Breast cancer Other   . Breast cancer Other   . Colon polyps Neg Hx   . Esophageal cancer Neg Hx   . Rectal cancer Neg Hx   . Stomach cancer Neg Hx     Social History   Socioeconomic History  . Marital status: Married    Spouse name: Not on file  . Number of children: 3  . Years of education: Not on file  . Highest education level: Not on file  Occupational History  . Occupation: Retired  Tobacco Use  . Smoking status: Never Smoker  . Smokeless tobacco: Never Used  Vaping Use  . Vaping Use: Never used  Substance and Sexual Activity  . Alcohol use: No    Alcohol/week: 0.0 standard drinks  . Drug use: No  . Sexual activity: Yes  Other Topics Concern  . Not on file  Social History Narrative  . Not on file   Social Determinants of Health   Financial  Resource Strain:   . Difficulty of Paying Living Expenses: Not on file  Food Insecurity:   . Worried About Charity fundraiser in the Last Year: Not on file  . Ran Out of Food in the Last Year: Not on file  Transportation Needs:   . Lack of Transportation (Medical): Not on file  . Lack of Transportation (Non-Medical): Not on file  Physical Activity:   . Days of Exercise per Week: Not on file  . Minutes of Exercise per Session: Not on file  Stress:   . Feeling of Stress : Not on file  Social Connections:   . Frequency of Communication with Friends and Family: Not on file  . Frequency of Social Gatherings with Friends and Family: Not on file  . Attends Religious Services: Not on file  . Active Member of Clubs or Organizations: Not on file  . Attends Archivist Meetings: Not on file  . Marital Status: Not on file  Intimate Partner Violence:   . Fear of Current or Ex-Partner: Not on file  . Emotionally Abused: Not on file  . Physically Abused: Not on file  . Sexually Abused: Not on file    Outpatient Medications Prior to Visit  Medication Sig Dispense Refill  . clonazePAM (KLONOPIN) 0.5 MG tablet Take 0.5-1 tablets (0.25-0.5 mg total) by mouth 2 (two) times daily as needed for anxiety. 180 tablet 0  . fexofenadine (ALLEGRA) 180 MG tablet Take 180 mg by mouth daily.    . fluticasone (FLONASE) 50 MCG/ACT nasal spray Place 2 sprays into both nostrils daily. 16 g 6  . lisinopril (ZESTRIL) 20 MG tablet TAKE ONE (1) TABLET EACH DAY 90 tablet 1  . mesalamine (LIALDA) 1.2 g EC tablet TAKE TWO TABLETS BY MOUTH DAILY 60 tablet 1  . OVER THE COUNTER MEDICATION Vitamin D-3  2000 units daily    . OVER THE COUNTER MEDICATION Stress B with Zinc    . potassium chloride (KLOR-CON) 10 MEQ tablet TAKE ONE (1) TABLET EACH DAY 90  tablet 1  . vitamin B-12 (CYANOCOBALAMIN) 1000 MCG tablet Take 1,000 mcg by mouth daily.    . Wheat Dextrin (BENEFIBER) POWD Take 1 Dose by mouth daily as needed.       No facility-administered medications prior to visit.    Allergies  Allergen Reactions  . Alendronate Sodium Other (See Comments)    Caused chest pain  . Lexapro [Escitalopram Oxalate] Other (See Comments)    Makes lymphangitic colitis worse  . Amoxicillin Other (See Comments)    diarrhea  . Aspirin Other (See Comments)    Per the patient, made the gastric ulcer worse.  . Cephalexin   . Clindamycin/Lincomycin   . Keflex [Cephalexin]   . Levofloxacin Other (See Comments)    Insomnia and headache  . Prednisone Other (See Comments)    Reaction is unknown..then states nervousness  . Sudafed [Pseudoephedrine Hcl] Other (See Comments)    insomnia  . Sulfa Antibiotics Hives  . Toprol Xl [Metoprolol Succinate] Other (See Comments)    Unknown reaction  . Trimethoprim     Unknown reaction  . Zithromax [Azithromycin] Rash    ROS Review of Systems  Constitutional: Negative for chills and fever.  Respiratory: Negative for cough and shortness of breath.   Cardiovascular: Negative for chest pain.  Gastrointestinal: Negative for abdominal pain.  Neurological: Positive for dizziness and light-headedness. Negative for seizures, syncope, speech difficulty and weakness.      Objective:    Physical Exam Vitals reviewed.  Constitutional:      Appearance: Normal appearance.  Cardiovascular:     Rate and Rhythm: Normal rate and regular rhythm.  Pulmonary:     Effort: Pulmonary effort is normal.     Breath sounds: Normal breath sounds.  Abdominal:     Palpations: Abdomen is soft.     Tenderness: There is no abdominal tenderness.  Musculoskeletal:     Right lower leg: No edema.     Left lower leg: No edema.  Neurological:     General: No focal deficit present.     Mental Status: She is alert.     Cranial Nerves: No cranial nerve deficit.     BP (!) 180/80   Pulse 98   Temp 98.1 F (36.7 C) (Other (Comment))   Ht 5\' 1"  (1.549 m)   Wt 118 lb (53.5 kg)   SpO2 98%   BMI  22.30 kg/m  Wt Readings from Last 3 Encounters:  01/10/20 118 lb (53.5 kg)  10/05/19 118 lb 3.2 oz (53.6 kg)  09/28/19 121 lb 6.4 oz (55.1 kg)     Health Maintenance Due  Topic Date Due  . MAMMOGRAM  10/26/2017  . DEXA SCAN  06/22/2018  . INFLUENZA VACCINE  12/10/2019    There are no preventive care reminders to display for this patient.  Lab Results  Component Value Date   TSH 1.45 05/04/2019   Lab Results  Component Value Date   WBC 6.6 09/29/2019   HGB 13.1 09/29/2019   HCT 40.2 09/29/2019   MCV 91.0 09/29/2019   PLT 239 09/29/2019   Lab Results  Component Value Date   NA 141 09/29/2019   K 3.1 (L) 09/29/2019   CO2 27 09/29/2019   GLUCOSE 100 (H) 09/29/2019   BUN 9 09/29/2019   CREATININE 0.69 09/29/2019   BILITOT 1.0 09/29/2019   ALKPHOS 51 09/29/2019   AST 23 09/29/2019   ALT 18 09/29/2019   PROT 6.4 (L) 09/29/2019   ALBUMIN 3.7 09/29/2019  CALCIUM 8.8 (L) 09/29/2019   ANIONGAP 10 09/29/2019   GFR 67.79 05/04/2019   Lab Results  Component Value Date   CHOL 299 (H) 05/04/2019   Lab Results  Component Value Date   HDL 87.10 05/04/2019   Lab Results  Component Value Date   LDLCALC 178 (H) 05/04/2019   Lab Results  Component Value Date   TRIG 170.0 (H) 05/04/2019   Lab Results  Component Value Date   CHOLHDL 3 05/04/2019   Lab Results  Component Value Date   HGBA1C 5.6 05/04/2019      Assessment & Plan:   Patient has history of hypertension and related nonspecific symptoms of lightheadedness and dizziness.  She has very high blood pressure today of 180/80 per nurse and after several minutes of rest I confirmed and had same reading  -Continue lisinopril and add amlodipine 5 mg once daily.  She has multiple drug allergies listed but did not see amlodipine -We strongly advised to follow-up with Dr. Jerilee Hoh in a couple weeks to reassess blood pressure -Watch sodium intake -May need further assessment of cognitive status that time as she  seems little bit confused today regarding medications and tended to ramble somewhat regarding her symptoms  Meds ordered this encounter  Medications  . amLODipine (NORVASC) 5 MG tablet    Sig: Take 1 tablet (5 mg total) by mouth daily.    Dispense:  30 tablet    Refill:  5    Follow-up: Return in about 2 weeks (around 01/24/2020).    Carolann Littler, MD

## 2020-01-10 NOTE — Patient Instructions (Signed)
Continue the Lisinopril 20 mg daily  Start Amlodipine 5 mg once daily  Set up 2 weeks follow up with Dr Jerilee Hoh.

## 2020-01-23 ENCOUNTER — Other Ambulatory Visit: Payer: Self-pay

## 2020-01-24 ENCOUNTER — Ambulatory Visit: Payer: Medicare PPO | Admitting: Internal Medicine

## 2020-01-24 ENCOUNTER — Encounter: Payer: Self-pay | Admitting: Internal Medicine

## 2020-01-24 VITALS — BP 130/78 | HR 74 | Temp 98.1°F | Wt 116.7 lb

## 2020-01-24 DIAGNOSIS — R251 Tremor, unspecified: Secondary | ICD-10-CM | POA: Diagnosis not present

## 2020-01-24 DIAGNOSIS — R413 Other amnesia: Secondary | ICD-10-CM | POA: Diagnosis not present

## 2020-01-24 DIAGNOSIS — R202 Paresthesia of skin: Secondary | ICD-10-CM

## 2020-01-24 DIAGNOSIS — I10 Essential (primary) hypertension: Secondary | ICD-10-CM | POA: Diagnosis not present

## 2020-01-24 DIAGNOSIS — Z23 Encounter for immunization: Secondary | ICD-10-CM | POA: Diagnosis not present

## 2020-01-24 NOTE — Patient Instructions (Signed)
-  Nice seeing you today!!  -Flu vaccine today.  -Keep taking your medications as you are.  -Schedule follow up in 3 months.

## 2020-01-24 NOTE — Addendum Note (Signed)
Addended by: Westley Hummer B on: 01/24/2020 05:23 PM   Modules accepted: Orders

## 2020-01-24 NOTE — Progress Notes (Signed)
Established Patient Office Visit     This visit occurred during the SARS-CoV-2 public health emergency.  Safety protocols were in place, including screening questions prior to the visit, additional usage of staff PPE, and extensive cleaning of exam room while observing appropriate contact time as indicated for disinfecting solutions.    CC/Reason for Visit: Blood pressure follow-up  HPI: ZAMARA COZAD is a 86 y.o. female who is coming in today for the above mentioned reasons.  She was seen in the office 2 weeks ago with complaints of dizziness.  She was found to have a blood pressure of 180/80.  She was started on amlodipine 5 mg in addition to her lisinopril 20 mg.  She comes in today for blood pressure follow-up.  Her most recent blood pressures are:  136/56, 131/57, 119/52, 139/63, 146/64, 126/65.  Her dizziness has completely resolved.   Past Medical/Surgical History: Past Medical History:  Diagnosis Date  . Acute gastric ulcer   . Allergy   . Anxiety   . Atrophic vaginitis   . Barrett's esophagus   . Basal cell carcinoma of nose     under nose  . Cataract   . Depression   . Diverticulosis   . Esophageal stricture   . Esophageal ulcer   . Fatty liver   . GERD (gastroesophageal reflux disease)   . Heart murmur   . Hiatal hernia   . Hx of adenomatous colonic polyps   . Hyperlipidemia   . Hypertension   . Hypokalemia   . IBS (irritable bowel syndrome)   . Kyphosis   . Lymphocytic colitis   . Macular degeneration    "?wet or dry; ? both eyes"  . 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 Osteopenia of the elderly   . Pneumonia X 1  . Postmenopausal   . Recurrent UTI   . 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 Tremor   . Vitamin D deficiency     Past Surgical History:  Procedure Laterality Date  . ABDOMINAL HYSTERECTOMY  1980  . APPENDECTOMY    . BACK SURGERY    . BREAST BIOPSY Left   . CATARACT EXTRACTION W/ INTRAOCULAR LENS  IMPLANT,  BILATERAL Bilateral   . COLONOSCOPY    . ESOPHAGOGASTRODUODENOSCOPY (EGD) WITH ESOPHAGEAL DILATION  X 1  . EYE SURGERY    . LUMBAR DISC SURGERY     "fragmented disc"  . POLYPECTOMY    . SQUAMOUS CELL CARCINOMA EXCISION     nose tip    Social History:  reports that she has never smoked. She has never used smokeless tobacco. She reports that she does not drink alcohol and does not use drugs.  Allergies: Allergies  Allergen Reactions  . Alendronate Sodium Other (See Comments)    Caused chest pain  . Lexapro [Escitalopram Oxalate] Other (See Comments)    Makes lymphangitic colitis worse  . Amoxicillin Other (See Comments)    diarrhea  . Aspirin Other (See Comments)    Per the patient, made the gastric ulcer worse.  . Cephalexin   . Clindamycin/Lincomycin   . Keflex [Cephalexin]   . Levofloxacin Other (See Comments)    Insomnia and headache  . Prednisone Other (See Comments)    Reaction is unknown..then states nervousness  . Sudafed [Pseudoephedrine Hcl] Other (See Comments)    insomnia  . Sulfa Antibiotics Hives  . Toprol Xl [Metoprolol Succinate] Other (See Comments)    Unknown reaction  .  Trimethoprim     Unknown reaction  . Zithromax [Azithromycin] Rash    Family History:  Family History  Problem Relation Age of Onset  . Stroke Mother   . Heart disease Father        MI  . Heart attack Father   . Cancer Sister        Brain tumor  . Hypertension Sister   . Osteoporosis Sister   . Colon cancer Brother   . Heart disease Brother   . Diabetes Son 8  . Diabetes Maternal Uncle   . Diabetes Son 14  . Hypertension Sister   . Breast cancer Sister 61  . Hypertension Sister   . Cancer Sister        breast  . Osteoporosis Sister   . Hypertension Sister   . Hypertension Sister   . Stroke Sister   . Hypertension Sister   . Hypertension Sister   . Colon cancer Other        nephew  . Brain cancer Son        deceased  . Cancer Son        brain  . Thyroid cancer  Other        niece  . Breast cancer Other   . Breast cancer Other   . Breast cancer Other   . Colon polyps Neg Hx   . Esophageal cancer Neg Hx   . Rectal cancer Neg Hx   . Stomach cancer Neg Hx      Current Outpatient Medications:  .  amLODipine (NORVASC) 5 MG tablet, Take 1 tablet (5 mg total) by mouth daily., Disp: 30 tablet, Rfl: 5 .  clonazePAM (KLONOPIN) 0.5 MG tablet, Take 0.5-1 tablets (0.25-0.5 mg total) by mouth 2 (two) times daily as needed for anxiety., Disp: 180 tablet, Rfl: 0 .  fexofenadine (ALLEGRA) 180 MG tablet, Take 180 mg by mouth daily., Disp: , Rfl:  .  fluticasone (FLONASE) 50 MCG/ACT nasal spray, Place 2 sprays into both nostrils daily., Disp: 16 g, Rfl: 6 .  lisinopril (ZESTRIL) 20 MG tablet, TAKE ONE (1) TABLET EACH DAY, Disp: 90 tablet, Rfl: 1 .  mesalamine (LIALDA) 1.2 g EC tablet, TAKE TWO TABLETS BY MOUTH DAILY, Disp: 60 tablet, Rfl: 1 .  OVER THE COUNTER MEDICATION, Vitamin D-3  2000 units daily, Disp: , Rfl:  .  OVER THE COUNTER MEDICATION, Stress B with Zinc, Disp: , Rfl:  .  potassium chloride (KLOR-CON) 10 MEQ tablet, TAKE ONE (1) TABLET EACH DAY, Disp: 90 tablet, Rfl: 1 .  vitamin B-12 (CYANOCOBALAMIN) 1000 MCG tablet, Take 1,000 mcg by mouth daily., Disp: , Rfl:  .  Wheat Dextrin (BENEFIBER) POWD, Take 1 Dose by mouth daily as needed. , Disp: , Rfl:   Review of Systems:  Constitutional: Denies fever, chills, diaphoresis, appetite change and fatigue.  HEENT: Denies photophobia, eye pain, redness, hearing loss, ear pain, congestion, sore throat, rhinorrhea, sneezing, mouth sores, trouble swallowing, neck pain, neck stiffness and tinnitus.   Respiratory: Denies SOB, DOE, cough, chest tightness,  and wheezing.   Cardiovascular: Denies chest pain, palpitations and leg swelling.  Gastrointestinal: Denies nausea, vomiting, abdominal pain, diarrhea, constipation, blood in stool and abdominal distention.  Genitourinary: Denies dysuria, urgency, frequency,  hematuria, flank pain and difficulty urinating.  Endocrine: Denies: hot or cold intolerance, sweats, changes in hair or nails, polyuria, polydipsia. Musculoskeletal: Denies myalgias, back pain, joint swelling, arthralgias and gait problem.  Skin: Denies pallor, rash and wound.  Neurological: Denies  dizziness, seizures, syncope, weakness, light-headedness, numbness and headaches.  Hematological: Denies adenopathy. Easy bruising, personal or family bleeding history  Psychiatric/Behavioral: Denies suicidal ideation, mood changes, confusion, nervousness, sleep disturbance and agitation    Physical Exam: Vitals:   01/24/20 1053  BP: 140/78  Pulse: 74  Temp: 98.1 F (36.7 C)  TempSrc: Oral  SpO2: 96%  Weight: 116 lb 11.2 oz (52.9 kg)    Body mass index is 22.05 kg/m.   Constitutional: NAD, calm, comfortable Eyes: PERRL, lids and conjunctivae normal, wears corrective lenses ENMT: Mucous membranes are moist.  Very hard of hearing.   Respiratory: clear to auscultation bilaterally, no wheezing, no crackles. Normal respiratory effort. No accessory muscle use.  Cardiovascular: Regular rate and rhythm, no murmurs / rubs / gallops. No extremity edema. Psychiatric: Normal judgment and insight. Alert and oriented x 3. Normal mood.    Impression and Plan:  Essential hypertension -Well-controlled, ambulatory measurements are at goal. -No changes today.  Continue lisinopril 20 mg and amlodipine 5 mg.  Need for influenza vaccination -Flu vaccine administered today    Patient Instructions  -Nice seeing you today!!  -Flu vaccine today.  -Keep taking your medications as you are.  -Schedule follow up in 3 months.     Lelon Frohlich, MD [facility]

## 2020-02-06 ENCOUNTER — Other Ambulatory Visit: Payer: Self-pay | Admitting: Family Medicine

## 2020-02-06 DIAGNOSIS — E876 Hypokalemia: Secondary | ICD-10-CM

## 2020-02-15 ENCOUNTER — Encounter: Payer: Medicare PPO | Admitting: Internal Medicine

## 2020-02-21 ENCOUNTER — Other Ambulatory Visit: Payer: Self-pay | Admitting: Internal Medicine

## 2020-02-21 DIAGNOSIS — F411 Generalized anxiety disorder: Secondary | ICD-10-CM

## 2020-02-21 DIAGNOSIS — R251 Tremor, unspecified: Secondary | ICD-10-CM

## 2020-02-28 ENCOUNTER — Encounter: Payer: Medicare PPO | Admitting: Internal Medicine

## 2020-03-02 ENCOUNTER — Other Ambulatory Visit: Payer: Self-pay | Admitting: Internal Medicine

## 2020-03-05 ENCOUNTER — Encounter: Payer: Self-pay | Admitting: Internal Medicine

## 2020-03-05 ENCOUNTER — Ambulatory Visit (INDEPENDENT_AMBULATORY_CARE_PROVIDER_SITE_OTHER): Payer: Medicare PPO | Admitting: Internal Medicine

## 2020-03-05 ENCOUNTER — Other Ambulatory Visit: Payer: Self-pay

## 2020-03-05 VITALS — BP 140/80 | HR 64 | Temp 98.7°F | Ht 61.0 in | Wt 118.5 lb

## 2020-03-05 DIAGNOSIS — F3341 Major depressive disorder, recurrent, in partial remission: Secondary | ICD-10-CM | POA: Diagnosis not present

## 2020-03-05 DIAGNOSIS — H353130 Nonexudative age-related macular degeneration, bilateral, stage unspecified: Secondary | ICD-10-CM

## 2020-03-05 DIAGNOSIS — I10 Essential (primary) hypertension: Secondary | ICD-10-CM

## 2020-03-05 DIAGNOSIS — E785 Hyperlipidemia, unspecified: Secondary | ICD-10-CM

## 2020-03-05 DIAGNOSIS — R5383 Other fatigue: Secondary | ICD-10-CM

## 2020-03-05 DIAGNOSIS — G250 Essential tremor: Secondary | ICD-10-CM | POA: Diagnosis not present

## 2020-03-05 NOTE — Patient Instructions (Signed)
-  Nice seeing you today!!  -Lab work today; will notify you once results are available.  -Schedule follow up in 3 months for your medicare visit.

## 2020-03-05 NOTE — Progress Notes (Signed)
Established Patient Office Visit     This visit occurred during the SARS-CoV-2 public health emergency.  Safety protocols were in place, including screening questions prior to the visit, additional usage of staff PPE, and extensive cleaning of exam room while observing appropriate contact time as indicated for disinfecting solutions.    CC/Reason for Visit: Follow-up chronic medical conditions  HPI: Rachel Walton is a 86 y.o. female who is coming in today for the above mentioned reasons. Past Medical History is significant for: Well-controlled hypertension, GERD, Barrett's esophagus not on PPI therapy followed by Dr. Elmo Putt, inflammatory bowel disease on mesalamine, benign essential tremor on Klonopin.  She has been complaining of excessive fatigue today but is otherwise doing well.  She had a flu vaccine earlier this month.   Past Medical/Surgical History: Past Medical History:  Diagnosis Date  . Acute gastric ulcer   . Allergy   . Anxiety   . Atrophic vaginitis   . Barrett's esophagus   . Basal cell carcinoma of nose     under nose  . Cataract   . Depression   . Diverticulosis   . Esophageal stricture   . Esophageal ulcer   . Fatty liver   . GERD (gastroesophageal reflux disease)   . Heart murmur   . Hiatal hernia   . Hx of adenomatous colonic polyps   . Hyperlipidemia   . Hypertension   . Hypokalemia   . IBS (irritable bowel syndrome)   . Kyphosis   . Lymphocytic colitis   . Macular degeneration    "?wet or dry; ? both eyes"  . 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 Osteopenia of the elderly   . Pneumonia X 1  . Postmenopausal   . Recurrent UTI   . 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 Tremor   . Vitamin D deficiency     Past Surgical History:  Procedure Laterality Date  . ABDOMINAL HYSTERECTOMY  1980  . APPENDECTOMY    . BACK SURGERY    . BREAST BIOPSY Left   . CATARACT EXTRACTION W/ INTRAOCULAR LENS  IMPLANT, BILATERAL Bilateral    . COLONOSCOPY    . ESOPHAGOGASTRODUODENOSCOPY (EGD) WITH ESOPHAGEAL DILATION  X 1  . EYE SURGERY    . LUMBAR DISC SURGERY     "fragmented disc"  . POLYPECTOMY    . SQUAMOUS CELL CARCINOMA EXCISION     nose tip    Social History:  reports that she has never smoked. She has never used smokeless tobacco. She reports that she does not drink alcohol and does not use drugs.  Allergies: Allergies  Allergen Reactions  . Alendronate Sodium Other (See Comments)    Caused chest pain  . Lexapro [Escitalopram Oxalate] Other (See Comments)    Makes lymphangitic colitis worse  . Amoxicillin Other (See Comments)    diarrhea  . Aspirin Other (See Comments)    Per the patient, made the gastric ulcer worse.  . Cephalexin   . Clindamycin/Lincomycin   . Keflex [Cephalexin]   . Levofloxacin Other (See Comments)    Insomnia and headache  . Prednisone Other (See Comments)    Reaction is unknown..then states nervousness  . Sudafed [Pseudoephedrine Hcl] Other (See Comments)    insomnia  . Sulfa Antibiotics Hives  . Toprol Xl [Metoprolol Succinate] Other (See Comments)    Unknown reaction  . Trimethoprim     Unknown reaction  . Zithromax [Azithromycin] Rash  Family History:  Family History  Problem Relation Age of Onset  . Stroke Mother   . Heart disease Father        MI  . Heart attack Father   . Cancer Sister        Brain tumor  . Hypertension Sister   . Osteoporosis Sister   . Colon cancer Brother   . Heart disease Brother   . Diabetes Son 8  . Diabetes Maternal Uncle   . Diabetes Son 25  . Hypertension Sister   . Breast cancer Sister 54  . Hypertension Sister   . Cancer Sister        breast  . Osteoporosis Sister   . Hypertension Sister   . Hypertension Sister   . Stroke Sister   . Hypertension Sister   . Hypertension Sister   . Colon cancer Other        nephew  . Brain cancer Son        deceased  . Cancer Son        brain  . Thyroid cancer Other        niece   . Breast cancer Other   . Breast cancer Other   . Breast cancer Other   . Colon polyps Neg Hx   . Esophageal cancer Neg Hx   . Rectal cancer Neg Hx   . Stomach cancer Neg Hx      Current Outpatient Medications:  .  amLODipine (NORVASC) 5 MG tablet, Take 1 tablet (5 mg total) by mouth daily., Disp: 30 tablet, Rfl: 5 .  clonazePAM (KLONOPIN) 0.5 MG tablet, TAKE 1/2 TO 1 TABLETS BY MOUTH 2 TIMES DAILY AS NEEDED FOR ANXIETY, Disp: 180 tablet, Rfl: 0 .  fexofenadine (ALLEGRA) 180 MG tablet, Take 180 mg by mouth daily., Disp: , Rfl:  .  fluticasone (FLONASE) 50 MCG/ACT nasal spray, Place 2 sprays into both nostrils daily., Disp: 16 g, Rfl: 6 .  lisinopril (ZESTRIL) 20 MG tablet, TAKE ONE (1) TABLET EACH DAY, Disp: 90 tablet, Rfl: 1 .  mesalamine (LIALDA) 1.2 g EC tablet, TAKE TWO TABLETS BY MOUTH DAILY, Disp: 60 tablet, Rfl: 1 .  OVER THE COUNTER MEDICATION, Vitamin D-3  2000 units daily, Disp: , Rfl:  .  OVER THE COUNTER MEDICATION, Stress B with Zinc, Disp: , Rfl:  .  potassium chloride (KLOR-CON) 10 MEQ tablet, TAKE ONE (1) TABLET EACH DAY, Disp: 90 tablet, Rfl: 1 .  vitamin B-12 (CYANOCOBALAMIN) 1000 MCG tablet, Take 1,000 mcg by mouth daily., Disp: , Rfl:  .  Wheat Dextrin (BENEFIBER) POWD, Take 1 Dose by mouth daily as needed. , Disp: , Rfl:   Review of Systems:  Constitutional: Denies fever, chills, diaphoresis, appetite change. HEENT: Denies photophobia, eye pain, redness, hearing loss, ear pain, congestion, sore throat, rhinorrhea, sneezing, mouth sores, trouble swallowing, neck pain, neck stiffness and tinnitus.   Respiratory: Denies SOB, DOE, cough, chest tightness,  and wheezing.   Cardiovascular: Denies chest pain, palpitations and leg swelling.  Gastrointestinal: Denies nausea, vomiting, abdominal pain, diarrhea, constipation, blood in stool and abdominal distention.  Genitourinary: Denies dysuria, urgency, frequency, hematuria, flank pain and difficulty urinating.  Endocrine:  Denies: hot or cold intolerance, sweats, changes in hair or nails, polyuria, polydipsia. Musculoskeletal: Denies myalgias, back pain, joint swelling, arthralgias and gait problem.  Skin: Denies pallor, rash and wound.  Neurological: Denies dizziness, seizures, syncope, weakness, light-headedness, numbness and headaches.  Hematological: Denies adenopathy. Easy bruising, personal or family bleeding history  Psychiatric/Behavioral: Denies suicidal ideation, mood changes, confusion, nervousness, sleep disturbance and agitation    Physical Exam: Vitals:   03/05/20 1130  BP: 140/80  Pulse: 64  Temp: 98.7 F (37.1 C)  TempSrc: Oral  SpO2: 97%  Weight: 118 lb 8 oz (53.8 kg)  Height: 5\' 1"  (1.549 m)    Body mass index is 22.39 kg/m.   Constitutional: NAD, calm, comfortable, resting tremor is apparent. Eyes: PERRL, lids and conjunctivae normal, wears corrective lenses ENMT: Mucous membranes are moist.  Respiratory: clear to auscultation bilaterally, no wheezing, no crackles. Normal respiratory effort. No accessory muscle use.  Cardiovascular: Regular rate and rhythm, no murmurs / rubs / gallops. No extremity edema. Psychiatric: Normal judgment and insight. Alert and oriented x 3. Normal mood.    Impression and Plan:  Fatigue, unspecified type  - Plan: Vitamin B12, TSH, VITAMIN D 25 Hydroxy (Vit-D Deficiency, Fractures)  Essential tremor -Continue Klonopin.  Bilateral nonexudative age-related macular degeneration, unspecified stage -Followed by ophthalmology.  Recurrent major depressive disorder, in partial remission ([place]) -   Office Visit from 03/05/2020 in [place] at [place]  PHQ-9 Total Score 11     -She has opted to try CBT sessions first, can continue adding medications if needed over the next few months.  Essential hypertension  - Plan: CBC with Differential/Platelet, Comprehensive metabolic panel -Well-controlled currently on lisinopril 20 mg and  amlodipine 5 mg.  Hyperlipidemia, unspecified hyperlipidemia type  - Plan: Lipid panel -Last LDL was 178 in December 2020.  She is not on statin therapy.   Patient Instructions  -Nice seeing you today!!  -Lab work today; will notify you once results are available.  -Schedule follow up in 3 months for your medicare visit.     Lelon Frohlich, MD  Primary [facility] at [facility]

## 2020-03-06 ENCOUNTER — Other Ambulatory Visit: Payer: Self-pay | Admitting: Internal Medicine

## 2020-03-06 ENCOUNTER — Encounter: Payer: Self-pay | Admitting: Internal Medicine

## 2020-03-06 DIAGNOSIS — E559 Vitamin D deficiency, unspecified: Secondary | ICD-10-CM | POA: Insufficient documentation

## 2020-03-06 DIAGNOSIS — E785 Hyperlipidemia, unspecified: Secondary | ICD-10-CM

## 2020-03-06 LAB — LIPID PANEL
Cholesterol: 296 mg/dL — ABNORMAL HIGH (ref <200)
HDL: 80 mg/dL (ref >=50)
LDL Cholesterol (Calc): 179 mg/dL (calc) — ABNORMAL HIGH
Non-HDL Cholesterol (Calc): 216 mg/dL (calc) — ABNORMAL HIGH (ref <130)
Total CHOL/HDL Ratio: 3.7 (calc) (ref <5.0)
Triglycerides: 210 mg/dL — ABNORMAL HIGH (ref <150)

## 2020-03-06 LAB — CBC WITH DIFFERENTIAL/PLATELET
Absolute Monocytes: 410 cells/uL (ref 200–950)
Basophils Absolute: 51 cells/uL (ref 0–200)
Basophils Relative: 0.9 %
Eosinophils Absolute: 131 cells/uL (ref 15–500)
Eosinophils Relative: 2.3 %
HCT: 41 % (ref 35.0–45.0)
Hemoglobin: 13.3 g/dL (ref 11.7–15.5)
Lymphs Abs: 2639 cells/uL (ref 850–3900)
MCH: 29.1 pg (ref 27.0–33.0)
MCHC: 32.4 g/dL (ref 32.0–36.0)
MCV: 89.7 fL (ref 80.0–100.0)
MPV: 12.3 fL (ref 7.5–12.5)
Monocytes Relative: 7.2 %
Neutro Abs: 2468 cells/uL (ref 1500–7800)
Neutrophils Relative %: 43.3 %
Platelets: 260 10*3/uL (ref 140–400)
RBC: 4.57 10*6/uL (ref 3.80–5.10)
RDW: 12.6 % (ref 11.0–15.0)
Total Lymphocyte: 46.3 %
WBC: 5.7 10*3/uL (ref 3.8–10.8)

## 2020-03-06 LAB — COMPREHENSIVE METABOLIC PANEL
AG Ratio: 1.5 (calc) (ref 1.0–2.5)
ALT: 16 U/L (ref 6–29)
AST: 22 U/L (ref 10–35)
Albumin: 3.7 g/dL (ref 3.6–5.1)
Alkaline phosphatase (APISO): 56 U/L (ref 37–153)
BUN: 8 mg/dL (ref 7–25)
CO2: 29 mmol/L (ref 20–32)
Calcium: 9.2 mg/dL (ref 8.6–10.4)
Chloride: 105 mmol/L (ref 98–110)
Creat: 0.82 mg/dL (ref 0.60–0.88)
Globulin: 2.5 g/dL (calc) (ref 1.9–3.7)
Glucose, Bld: 95 mg/dL (ref 65–99)
Potassium: 4.7 mmol/L (ref 3.5–5.3)
Sodium: 143 mmol/L (ref 135–146)
Total Bilirubin: 0.5 mg/dL (ref 0.2–1.2)
Total Protein: 6.2 g/dL (ref 6.1–8.1)

## 2020-03-06 LAB — VITAMIN D 25 HYDROXY (VIT D DEFICIENCY, FRACTURES): Vit D, 25-Hydroxy: 30 ng/mL (ref 30–100)

## 2020-03-06 LAB — VITAMIN B12: Vitamin B-12: 625 pg/mL (ref 200–1100)

## 2020-03-06 LAB — TSH: TSH: 1.78 mIU/L (ref 0.40–4.50)

## 2020-03-06 MED ORDER — VITAMIN D (ERGOCALCIFEROL) 1.25 MG (50000 UNIT) PO CAPS: 50000.0000 [IU] | ORAL_CAPSULE | ORAL | 0 refills | Status: AC

## 2020-03-06 MED ORDER — ATORVASTATIN CALCIUM 10 MG PO TABS: 10.0000 mg | ORAL_TABLET | Freq: Every day | ORAL | 1 refills | Status: DC

## 2020-03-07 ENCOUNTER — Other Ambulatory Visit: Payer: Self-pay | Admitting: Internal Medicine

## 2020-03-07 DIAGNOSIS — E559 Vitamin D deficiency, unspecified: Secondary | ICD-10-CM

## 2020-03-20 ENCOUNTER — Emergency Department (HOSPITAL_COMMUNITY): Payer: Medicare PPO

## 2020-03-20 ENCOUNTER — Encounter (HOSPITAL_COMMUNITY): Payer: Self-pay | Admitting: Emergency Medicine

## 2020-03-20 ENCOUNTER — Emergency Department (HOSPITAL_COMMUNITY)
Admission: EM | Admit: 2020-03-20 | Discharge: 2020-03-20 | Disposition: A | Discharge status: Home / Self Care | Payer: Medicare PPO | Attending: Emergency Medicine | Admitting: Emergency Medicine

## 2020-03-20 ENCOUNTER — Other Ambulatory Visit: Payer: Self-pay

## 2020-03-20 DIAGNOSIS — Y92019 Unspecified place in single-family (private) house as the place of occurrence of the external cause: Secondary | ICD-10-CM | POA: Insufficient documentation

## 2020-03-20 DIAGNOSIS — R609 Edema, unspecified: Secondary | ICD-10-CM | POA: Diagnosis not present

## 2020-03-20 DIAGNOSIS — R0902 Hypoxemia: Secondary | ICD-10-CM | POA: Diagnosis not present

## 2020-03-20 DIAGNOSIS — S52502A Unspecified fracture of the lower end of left radius, initial encounter for closed fracture: Secondary | ICD-10-CM | POA: Diagnosis not present

## 2020-03-20 DIAGNOSIS — I10 Essential (primary) hypertension: Secondary | ICD-10-CM | POA: Diagnosis not present

## 2020-03-20 DIAGNOSIS — S6292XA Unspecified fracture of left wrist and hand, initial encounter for closed fracture: Secondary | ICD-10-CM | POA: Insufficient documentation

## 2020-03-20 DIAGNOSIS — Z85828 Personal history of other malignant neoplasm of skin: Secondary | ICD-10-CM | POA: Insufficient documentation

## 2020-03-20 DIAGNOSIS — W108XXA Fall (on) (from) other stairs and steps, initial encounter: Secondary | ICD-10-CM | POA: Insufficient documentation

## 2020-03-20 DIAGNOSIS — Z79899 Other long term (current) drug therapy: Secondary | ICD-10-CM | POA: Diagnosis not present

## 2020-03-20 DIAGNOSIS — S6992XA Unspecified injury of left wrist, hand and finger(s), initial encounter: Secondary | ICD-10-CM | POA: Diagnosis present

## 2020-03-20 DIAGNOSIS — S62102A Fracture of unspecified carpal bone, left wrist, initial encounter for closed fracture: Secondary | ICD-10-CM

## 2020-03-20 DIAGNOSIS — R52 Pain, unspecified: Secondary | ICD-10-CM | POA: Diagnosis not present

## 2020-03-20 DIAGNOSIS — W19XXXA Unspecified fall, initial encounter: Secondary | ICD-10-CM | POA: Diagnosis not present

## 2020-03-20 DIAGNOSIS — S52615A Nondisplaced fracture of left ulna styloid process, initial encounter for closed fracture: Secondary | ICD-10-CM | POA: Diagnosis not present

## 2020-03-20 MED ORDER — MORPHINE SULFATE (PF) 4 MG/ML IV SOLN
4.0000 mg | Freq: Once | INTRAVENOUS | Status: AC
  Administered 2020-03-20: 4 mg via INTRAVENOUS
  Filled 2020-03-20: qty 1

## 2020-03-20 MED ORDER — LIDOCAINE-EPINEPHRINE (PF) 2 %-1:200000 IJ SOLN
10.0000 mL | Freq: Once | INTRAMUSCULAR | Status: AC
  Administered 2020-03-20: 10 mL
  Filled 2020-03-20: qty 10

## 2020-03-20 NOTE — ED Notes (Signed)
2nd attempt at splint application. Fingers still noted to have purplish color. Pt able to move all fingers , bend elbow back and forth. Dr.  Arville Go aware.

## 2020-03-20 NOTE — ED Notes (Signed)
Son Owen Pagnotta ) son [phone].  Husband is in the the car.

## 2020-03-20 NOTE — Discharge Instructions (Signed)
If you develop new or worsening pain, color change in your fingers, new or worsening swelling or tingling or any other new/concerning symptoms then return to the ER for evaluation.

## 2020-03-20 NOTE — ED Provider Notes (Signed)
[facility] EMERGENCY DEPARTMENT Provider Note   CSN: [id number] Arrival date & time: 03/20/20  1725     History Chief Complaint  Patient presents with  . Rachel Walton is a 86 y.o. female.  HPI 86 year old female presents with left wrist injury.  She was leaving a friend's house and missed a step coming off the porch.  Landed on outstretched hand.  Swelling and injury to left wrist.  There is pain and she has been given IV morphine 10 mg.  No head injury or neck injury.  No other injuries including no leg injuries or hip injury.   Past Medical History:  Diagnosis Date  . Acute gastric ulcer   . Allergy   . Anxiety   . Atrophic vaginitis   . Barrett's esophagus   . Basal cell carcinoma of nose     under nose  . Cataract   . Depression   . Diverticulosis   . Esophageal stricture   . Esophageal ulcer   . Fatty liver   . GERD (gastroesophageal reflux disease)   . Heart murmur   . Hiatal hernia   . Hx of adenomatous colonic polyps   . Hyperlipidemia   . Hypertension   . Hypokalemia   . IBS (irritable bowel syndrome)   . Kyphosis   . Lymphocytic colitis   . Macular degeneration    "?wet or dry; ? both eyes"  . 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 Osteopenia of the elderly   . Pneumonia X 1  . Postmenopausal   . Recurrent UTI   . 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 Tremor   . Vitamin D deficiency     Patient Active Problem List   Diagnosis Date Noted  . Vitamin D deficiency 03/06/2020  . Osteopenia with high risk of fracture 10/21/2016  . Essential tremor 05/20/2016  . Age-related macular degeneration, dry, both eyes 04/07/2016  . Basal cell carcinoma of nose 02/05/2016  . Lymphocytic colitis 12/20/2015  . Barrett's esophagus 12/20/2015  . 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 Depression 11/07/2015  . Chest pain 10/09/2015  . Hypokalemia 10/09/2015  . Anemia 10/09/2015  . Blepharoptosis, acquired, 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  .  Esophageal stricture 12/11/2013  . Allergic rhinitis due to pollen 12/11/2013  . Essential hypertension 12/11/2013  . Amblyopia ex anopsia of right eye 12/07/2013  . Generalized anxiety disorder 08/21/2013  . Gall stones 06/15/2013  . Degenerative drusen 05/05/2013  . Hyperlipemia 11/21/2012  . Postmenopausal   . Colon polyp   . Osteopenia of the elderly   . Kyphosis   . Recurrent UTI   . Atrophic vaginitis   . Diverticulosis     Past Surgical History:  Procedure Laterality Date  . ABDOMINAL HYSTERECTOMY  1980  . APPENDECTOMY    . BACK SURGERY    . BREAST BIOPSY Left   . CATARACT EXTRACTION W/ INTRAOCULAR LENS  IMPLANT, BILATERAL Bilateral   . COLONOSCOPY    . ESOPHAGOGASTRODUODENOSCOPY (EGD) WITH ESOPHAGEAL DILATION  X 1  . EYE SURGERY    . LUMBAR DISC SURGERY     "fragmented disc"  . POLYPECTOMY    . SQUAMOUS CELL CARCINOMA EXCISION     nose tip     OB History   No obstetric history on file.     Family History  Problem Relation Age of Onset  . Stroke Mother   . Heart disease Father  MI  . Heart attack Father   . Cancer Sister        Brain tumor  . Hypertension Sister   . Osteoporosis Sister   . Colon cancer Brother   . Heart disease Brother   . Diabetes Son 8  . Diabetes Maternal Uncle   . Diabetes Son 45  . Hypertension Sister   . Breast cancer Sister 54  . Hypertension Sister   . Cancer Sister        breast  . Osteoporosis Sister   . Hypertension Sister   . Hypertension Sister   . Stroke Sister   . Hypertension Sister   . Hypertension Sister   . Colon cancer Other        nephew  . Brain cancer Son        deceased  . Cancer Son        brain  . Thyroid cancer Other        niece  . Breast cancer Other   . Breast cancer Other   . Breast cancer Other   . Colon polyps Neg Hx   . Esophageal cancer Neg Hx   . Rectal cancer Neg Hx   . Stomach cancer Neg Hx     Social History   Tobacco Use  . Smoking status: Never Smoker  .  Smokeless tobacco: Never Used  Vaping Use  . Vaping Use: Never used  Substance Use Topics  . Alcohol use: No    Alcohol/week: 0.0 standard drinks  . Drug use: No    Home Medications Prior to Admission medications   Medication Sig Start Date End Date Taking? Authorizing Provider  acetaminophen (TYLENOL) 325 MG tablet Take 650 mg by mouth every 6 (six) hours as needed.   Yes [provider]  amLODipine (NORVASC) 5 MG tablet Take 1 tablet (5 mg total) by mouth daily. 01/10/20  Yes Burchette, Alinda Sierras, MD  clonazePAM (KLONOPIN) 0.5 MG tablet TAKE 1/2 TO 1 TABLETS BY MOUTH 2 TIMES DAILY AS NEEDED FOR ANXIETY Patient taking differently: Take 0.5 mg by mouth 2 (two) times daily.  02/21/20  Yes Isaac Bliss, Rayford Halsted, MD  fexofenadine (ALLEGRA) 180 MG tablet Take 180 mg by mouth daily.   Yes [provider]  fluticasone (FLONASE) 50 MCG/ACT nasal spray Place 2 sprays into both nostrils daily. 09/28/19  Yes Isaac Bliss, Rayford Halsted, MD  lisinopril (ZESTRIL) 20 MG tablet TAKE ONE (1) TABLET EACH DAY Patient taking differently: Take 20 mg by mouth daily.  12/20/19  Yes Isaac Bliss, Rayford Halsted, MD  mesalamine (LIALDA) 1.2 g EC tablet TAKE TWO TABLETS BY MOUTH DAILY Patient taking differently: Take 1.2 g by mouth daily with breakfast.  10/30/19  Yes Pyrtle, Lajuan Lines, MD  OVER THE COUNTER MEDICATION Vitamin D-3  2000 units daily   Yes [provider]  OVER THE COUNTER MEDICATION Stress B with Zinc   Yes [provider]  vitamin B-12 (CYANOCOBALAMIN) 1000 MCG tablet Take 1,000 mcg by mouth daily.   Yes [provider]  Vitamin D, Ergocalciferol, (DRISDOL) 1.25 MG (50000 UNIT) CAPS capsule Take 1 capsule (50,000 Units total) by mouth every 7 (seven) days for 12 doses. 03/06/20 05/23/20 Yes Erline Hau, MD  Wheat Dextrin (BENEFIBER) POWD Take 1 Dose by mouth daily as needed.    Yes [provider]  atorvastatin (LIPITOR) 10 MG tablet  Take 1 tablet (10 mg total) by mouth daily. Patient not taking: Reported on 03/20/2020  03/06/20   Isaac Bliss, Rayford Halsted, MD  potassium chloride (KLOR-CON) 10 MEQ tablet TAKE ONE (1) TABLET EACH DAY Patient not taking: Reported on 03/20/2020 02/06/20   Isaac Bliss, Rayford Halsted, MD    Allergies    Alendronate sodium, Lexapro [escitalopram oxalate], Amoxicillin, Aspirin, Cephalexin, Clindamycin/lincomycin, Keflex [cephalexin], Levofloxacin, Prednisone, Sudafed [pseudoephedrine hcl], Sulfa antibiotics, Toprol xl [metoprolol succinate], Trimethoprim, and Zithromax [azithromycin]  Review of Systems   Review of Systems  Musculoskeletal: Positive for arthralgias and joint swelling.  Neurological: Negative for weakness and numbness.  All other systems reviewed and are negative.   Physical Exam Updated Vital Signs BP (!) 155/55 (BP Location: Right Arm)   Pulse 68   Temp 97.7 F (36.5 C) (Oral)   Resp 20   Ht 5\' 1"  (1.549 m)   Wt 53.5 kg   SpO2 95%   BMI 22.30 kg/m   Physical Exam Vitals and nursing note reviewed.  Constitutional:      Appearance: She is well-developed.  HENT:     Head: Normocephalic and atraumatic.     Right Ear: External ear normal.     Left Ear: External ear normal.     Nose: Nose normal.  Eyes:     General:        Right eye: No discharge.        Left eye: No discharge.  Cardiovascular:     Rate and Rhythm: Normal rate and regular rhythm.     Pulses:          Radial pulses are 2+ on the left side.     Heart sounds: Normal heart sounds.  Pulmonary:     Effort: Pulmonary effort is normal.     Breath sounds: Normal breath sounds.  Abdominal:     Palpations: Abdomen is soft.     Tenderness: There is no abdominal tenderness.  Musculoskeletal:     Left forearm: No swelling or tenderness.     Left wrist: Swelling and tenderness present.     Left hand: No swelling, deformity or tenderness.     Comments: Normal sensation in left hand. Able to range hand  though it hurts left wrist  Skin:    General: Skin is warm and dry.  Neurological:     Mental Status: She is alert.  Psychiatric:        Mood and Affect: Mood is not anxious.     ED Results / Procedures / Treatments   Labs (all labs ordered are listed, but only abnormal results are displayed) Labs Reviewed - No data to display  EKG None  Radiology DG Forearm Left  Result Date: 03/20/2020 CLINICAL DATA:  Distal radial fracture EXAM: LEFT FOREARM - 2 VIEW COMPARISON:  None. FINDINGS: Fracture of the distal radius dorsal angulation and comminution. The proximal radius and ulna appear normal. IMPRESSION: Distal radial fracture described on same day wrist film. Proximal radius and ulna appear normal. Electronically Signed   By: Suzy Bouchard M.D.   On: 03/20/2020 19:06   DG Wrist 2 Views Left  Result Date: 03/20/2020 CLINICAL DATA:  LEFT wrist fracture EXAM: LEFT WRIST - 2 VIEW COMPARISON:  None. FINDINGS: Interval splinting of the distal LEFT radial fracture. Improved alignment. Fine detail obscured cast material. IMPRESSION: Interval splinting of distal radial fracture.  Improved alignment. Electronically Signed   By: Suzy Bouchard M.D.   On: 03/20/2020 20:12   DG Wrist Complete Left  Result Date: 03/20/2020 CLINICAL DATA:  Fall.  Small wrist EXAM:  LEFT WRIST - COMPLETE 3+ VIEW COMPARISON:  None. FINDINGS: Fracture of the distal radius with dorsal angulation. No true lateral lateral projection however the radiocarpal joint appears intact. There is comminution of the distal radius. Additional fracture of the ulnar styloid IMPRESSION: Distal radial fracture with dorsal angulation and comminution. Radiocarpal joint appears intact. Ulnar styloid fracture. Electronically Signed   By: Suzy Bouchard M.D.   On: 03/20/2020 19:04    Procedures Reduction of fracture  Date/Time: 03/20/2020 7:38 PM Performed by: Sherwood Gambler, MD Authorized by: Sherwood Gambler, MD  Preparation:  Patient was prepped and draped in the usual sterile fashion. Local anesthesia used: yes Anesthesia: hematoma block  Anesthesia: Local anesthesia used: yes Local Anesthetic: lidocaine 2% with epinephrine Anesthetic total: 5 mL  Sedation: Patient sedated: no  Patient tolerance: patient tolerated the procedure well with no immediate complications    (including critical care time)  Medications Ordered in ED Medications  morphine 4 MG/ML injection 4 mg (4 mg Intravenous Given 03/20/20 1903)  lidocaine-EPINEPHrine (XYLOCAINE W/EPI) 2 %-1:200000 (PF) injection 10 mL (10 mLs Infiltration Given 03/20/20 2020)    ED Course  I have reviewed the triage vital signs and the nursing notes.  Pertinent labs & imaging results that were available during my care of the patient were reviewed by me and considered in my medical decision making (see chart for details).    MDM Rules/Calculators/A&P                          Patient with isolated wrist fracture after a fall. Neurovascularly intact. Was reduced as above after hematoma block and IV morphine. Better alignment. Discussed with Dr. Aline Brochure who will follow up in office. First splint applied seemed to be too tight so it was replaced. Now no longer having tingling in fingers. D/c home with follow up with ortho and return precautions. She declines Rx for pain control  Final Clinical Impression(s) / ED Diagnoses Final diagnoses:  Left wrist fracture, closed, initial encounter    Rx / DC Orders ED Discharge Orders    None       Sherwood Gambler, MD 03/20/20 2332

## 2020-03-20 NOTE — ED Triage Notes (Addendum)
Pt down two steps trying to catch her, injuring left wrist. Deformity noticed, pulses present. Morphine given by EMS

## 2020-03-21 ENCOUNTER — Telehealth: Payer: Self-pay | Admitting: Orthopedic Surgery

## 2020-03-21 NOTE — Telephone Encounter (Signed)
Patient just called.   Please review ED notes and advise when you can see her.  Thanks

## 2020-03-21 NOTE — Telephone Encounter (Signed)
Tues at 10 am yes I said tues not a mistake

## 2020-03-26 ENCOUNTER — Other Ambulatory Visit: Payer: Self-pay

## 2020-03-26 ENCOUNTER — Telehealth: Payer: Self-pay | Admitting: Internal Medicine

## 2020-03-26 ENCOUNTER — Ambulatory Visit: Payer: Medicare PPO | Admitting: Orthopedic Surgery

## 2020-03-26 ENCOUNTER — Encounter: Payer: Self-pay | Admitting: Orthopedic Surgery

## 2020-03-26 VITALS — BP 157/71 | HR 85 | Ht 61.0 in | Wt 118.0 lb

## 2020-03-26 DIAGNOSIS — S52532A Colles' fracture of left radius, initial encounter for closed fracture: Secondary | ICD-10-CM | POA: Diagnosis not present

## 2020-03-26 DIAGNOSIS — M79642 Pain in left hand: Secondary | ICD-10-CM

## 2020-03-26 NOTE — Telephone Encounter (Signed)
Pt is calling in stating that she would like to have a referral to a specialist for her L hand she fell on 03/20/2020 and went to [facility] that night and they referred her to a Ortho in [place] that she went to today and they told her that she need to go to a hand specialist but they did not refer her to anyone they just told her to get someone in [place] b/c they would not do a good job on what need to be done to her hand.  Pt decline to make an appointment to see Dr. Jerilee Hoh for the referral.  Pt would like to have a call back.

## 2020-03-26 NOTE — Telephone Encounter (Signed)
Okay to refer? 

## 2020-03-26 NOTE — Telephone Encounter (Signed)
Bascom Levels is calling and needed to speak to someone regarding getting Pre Authorization for medical surgery for pt at [facility], please advise. CB is [phone] authorizations department.

## 2020-03-26 NOTE — Progress Notes (Signed)
Chief Complaint  Patient presents with  . Wrist Injury    Lt wrist DOI 03/20/20   86 yo female fractured the left wrist 6 days ago 2nd to mechanical fall, had closed reduction in the ER presents for definitive management .  C/o pain on tylenol 500 mg bid   Denies numbness but does have quite a bit of swelling   Dr Matilde Haymaker has operated on her and she prefers [facility] for definitive care   Review of Systems  Gastrointestinal: Positive for diarrhea.     No orders of the defined types were placed in this encounter.  Past Medical History:  Diagnosis Date  . Acute gastric ulcer   . Allergy   . Anxiety   . Atrophic vaginitis   . Barrett's esophagus   . Basal cell carcinoma of nose     under nose  . Cataract   . Depression   . Diverticulosis   . Esophageal stricture   . Esophageal ulcer   . Fatty liver   . GERD (gastroesophageal reflux disease)   . Heart murmur   . Hiatal hernia   . Hx of adenomatous colonic polyps   . Hyperlipidemia   . Hypertension   . Hypokalemia   . IBS (irritable bowel syndrome)   . Kyphosis   . Lymphocytic colitis   . Macular degeneration    "?wet or dry; ? both eyes"  . 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 Osteopenia of the elderly   . Pneumonia X 1  . Postmenopausal   . Recurrent UTI   . 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 Tremor   . Vitamin D deficiency          Past Surgical History:  Procedure Laterality Date  . ABDOMINAL HYSTERECTOMY  1980  . APPENDECTOMY    . BACK SURGERY    . BREAST BIOPSY Left   . CATARACT EXTRACTION W/ INTRAOCULAR LENS  IMPLANT, BILATERAL Bilateral   . COLONOSCOPY    . ESOPHAGOGASTRODUODENOSCOPY (EGD) WITH ESOPHAGEAL DILATION  X 1  . EYE SURGERY    . LUMBAR DISC SURGERY     "fragmented disc"  . POLYPECTOMY    . SQUAMOUS CELL CARCINOMA EXCISION     nose tip   Family History  Problem Relation Age of Onset  . Stroke Mother   . Heart disease Father        MI  . Heart attack Father    . Cancer Sister        Brain tumor  . Hypertension Sister   . Osteoporosis Sister   . Colon cancer Brother   . Heart disease Brother   . Diabetes Son 8  . Diabetes Maternal Uncle   . Diabetes Son 38  . Hypertension Sister   . Breast cancer Sister 89  . Hypertension Sister   . Cancer Sister        breast  . Osteoporosis Sister   . Hypertension Sister   . Hypertension Sister   . Stroke Sister   . Hypertension Sister   . Hypertension Sister   . Colon cancer Other        nephew  . Brain cancer Son        deceased  . Cancer Son        brain  . Thyroid cancer Other        niece  . Breast cancer Other   . Breast cancer Other   . Breast  cancer Other   . Colon polyps Neg Hx   . Esophageal cancer Neg Hx   . Rectal cancer Neg Hx   . Stomach cancer Neg Hx    Social History   Tobacco Use  . Smoking status: Never Smoker  . Smokeless tobacco: Never Used  Vaping Use  . Vaping Use: Never used  Substance Use Topics  . Alcohol use: No    Alcohol/week: 0.0 standard drinks  . Drug use: No    Current Outpatient Medications:  .  acetaminophen (TYLENOL) 325 MG tablet, Take 650 mg by mouth every 6 (six) hours as needed., Disp: , Rfl:  .  amLODipine (NORVASC) 5 MG tablet, Take 1 tablet (5 mg total) by mouth daily., Disp: 30 tablet, Rfl: 5 .  clonazePAM (KLONOPIN) 0.5 MG tablet, TAKE 1/2 TO 1 TABLETS BY MOUTH 2 TIMES DAILY AS NEEDED FOR ANXIETY (Patient taking differently: Take 0.5 mg by mouth 2 (two) times daily. ), Disp: 180 tablet, Rfl: 0 .  fexofenadine (ALLEGRA) 180 MG tablet, Take 180 mg by mouth daily., Disp: , Rfl:  .  fluticasone (FLONASE) 50 MCG/ACT nasal spray, Place 2 sprays into both nostrils daily., Disp: 16 g, Rfl: 6 .  lisinopril (ZESTRIL) 20 MG tablet, TAKE ONE (1) TABLET EACH DAY (Patient taking differently: Take 20 mg by mouth daily. ), Disp: 90 tablet, Rfl: 1 .  mesalamine (LIALDA) 1.2 g EC tablet, TAKE TWO TABLETS BY MOUTH DAILY (Patient taking differently: Take  1.2 g by mouth daily with breakfast. ), Disp: 60 tablet, Rfl: 1 .  OVER THE COUNTER MEDICATION, Vitamin D-3  2000 units daily, Disp: , Rfl:  .  OVER THE COUNTER MEDICATION, Stress B with Zinc, Disp: , Rfl:  .  vitamin B-12 (CYANOCOBALAMIN) 1000 MCG tablet, Take 1,000 mcg by mouth daily., Disp: , Rfl:  .  Vitamin D, Ergocalciferol, (DRISDOL) 1.25 MG (50000 UNIT) CAPS capsule, Take 1 capsule (50,000 Units total) by mouth every 7 (seven) days for 12 doses., Disp: 12 capsule, Rfl: 0 .  Wheat Dextrin (BENEFIBER) POWD, Take 1 Dose by mouth daily as needed. , Disp: , Rfl:  .  atorvastatin (LIPITOR) 10 MG tablet, Take 1 tablet (10 mg total) by mouth daily. (Patient not taking: Reported on 03/20/2020), Disp: 90 tablet, Rfl: 1 .  potassium chloride (KLOR-CON) 10 MEQ tablet, TAKE ONE (1) TABLET EACH DAY (Patient not taking: Reported on 03/20/2020), Disp: 90 tablet, Rfl: 1   BP (!) 157/71   Pulse 85   Ht 5\' 1"  (1.549 m)   Wt 118 lb (53.5 kg)   BMI 22.30 kg/m   Physical Exam Constitutional:      General: She is not in acute distress.    Appearance: Normal appearance. She is normal weight. She is not ill-appearing, toxic-appearing or diaphoretic.  HENT:     Head: Normocephalic and atraumatic.     Nose: Nose normal. No congestion or rhinorrhea.     Mouth/Throat:     Pharynx: Oropharynx is clear. No oropharyngeal exudate or posterior oropharyngeal erythema.  Eyes:     General: No scleral icterus.    Extraocular Movements: Extraocular movements intact.     Conjunctiva/sclera: Conjunctivae normal.     Pupils: Pupils are equal, round, and reactive to light.  Cardiovascular:     Rate and Rhythm: Normal rate.     Pulses: Normal pulses.  Musculoskeletal:     Comments: Left upper extremity Tenderness distal radius  rom limited by pain  Wrist frx unstable by  xrays but xrays shows no dislocation  Muscle tone nrmal and the fingers are moving   Skin intact   Skin:    Capillary Refill: Capillary  refill takes less than 2 seconds.  Neurological:     General: No focal deficit present.     Mental Status: She is alert and oriented to person, place, and time.  Psychiatric:        Mood and Affect: Mood normal.        Behavior: Behavior normal.        Thought Content: Thought content normal.        Judgment: Judgment normal.    Xrays: from [facility]   1st set: comminuted frx distal radius severe displacement   2nd set slight improved alignment but by no means acceptable reduction   rec referral to hand specialist

## 2020-03-26 NOTE — Patient Instructions (Addendum)
KEEP ELEVATED   REFERRING YOU TO A HAND SPECIALIST FOR SURGERY   You requested [facility], you can call for appointment [phone]   Colles Fracture  Colles fracture is a type of broken wrist. It means that the radius bone is broken or cracked near the wrist joint. The radius is one of two bones in the forearm. It is on the same side as the thumb. The other forearm bone is called the ulna. Often, when someone has a Colles fracture, the ulna is also broken. As this injury heals, a splint or a cast is used to prevent the injured bone from moving (keep it immobilized). What are the causes? Common causes of this type of fracture include:  A hard, direct hit to the wrist.  Accidents, such as a car accident or falling on an outstretched hand. What increases the risk? You may be at higher risk for this type of fracture if you:  Play contact sports or high-risk sports, such as skiing, biking, or ice-skating.  Smoke.  Drink more than three alcoholic beverages a day.  Have low or lowered bone density (osteoporosis or osteopenia).  Are a young child or an older adult.  Are a woman who has gone through menopause.  Have a history of bone fractures.  Are not getting enough (have a deficiency in) calcium or vitamin D. What are the signs or symptoms? Symptoms of a Colles fracture may include:  Tenderness, bruising, and swelling over the fracture, which is usually near the wrist.  The wrist hanging in an odd position or looking misshapen (deformed).  Difficulty moving the wrist. How is this diagnosed? This condition may be diagnosed based on:  A physical exam.  A forearm X-ray.  Your symptoms and medical history. How is this treated? Treatment depends on many factors, including your age, your activity level, and how severe your fracture is. Treatment may include:  Immobilizing the wrist with a splint or a cast for several weeks. Before a splint or cast is placed on the  arm, the health care provider may move the fractured bone or bones back into place (realignment).  Surgery, if the bone is completely out of place (displaced). Metal pins or other devices may be used to help hold the bone in place while it heals. After surgery, the arm is put in a splint or cast.  Physical therapy. Follow these instructions at home: If you have a splint:  Wear the splint as told by your health care provider. Remove it only as told by your health care provider.  Loosen the splint if your fingers tingle, become numb, or turn cold and blue.  Keep the splint clean.  If your splint is not waterproof: ? Do not let it get wet. ? Cover it with a watertight covering when you take a bath or a shower. If you have a cast:  Do not stick anything inside the cast to scratch your skin. Doing that increases your risk for infection.  Check the skin around the cast every day. Tell your health care provider about any concerns.  You may put lotion on dry skin around the edges of the cast. Do not put lotion on the skin underneath the cast.  Keep the cast clean.  If the cast is not waterproof: ? Do not let it get wet. ? Cover it with a watertight covering when you take a bath or a shower. Managing pain, stiffness, and swelling   If directed, put ice  on the injured area: ? If you have a removable splint, remove it as told by your health care provider. ? Put ice in a plastic bag. ? Place a towel between your skin and the bag, or between your cast and the bag. ? Leave the ice on for 20 minutes, 2-3 times a day.  Move your fingers often to avoid stiffness and to lessen swelling.  Raise (elevate) your wrist above the level of your heart while you are sitting or lying down. Driving  Do not drive or use heavy machinery while taking prescription pain medicine.  Ask your health care provider if it is safe for you to drive if you have a splint or cast on your arm. Activity  Do not  lift anything that is heavier than 10 lb (4.5 kg), or the limit that you are told, until your health care provider says that it is safe.  Do not use your arm to support your body weight until your health care provider says that you can.  Return to your normal activities as told by your health care provider. Ask your health care provider what activities are safe for you.  If physical therapy was prescribed, do exercises as told by your health care provider. General instructions  Do not put pressure on any part of the splint or cast until it is fully hardened. This may take several hours.  Do not use any products that contain nicotine or tobacco, such as cigarettes and e-cigarettes. These can delay bone healing. If you need help quitting, ask your health care provider.  Take over-the-counter and prescription medicines only as told by your health care provider.  Keep all follow-up visits as told by your health care provider. This is important. Contact a health care provider if:  Your splint or cast: ? Gets wet. ? Gets damaged. ? Suddenly feels too tight.  You have: ? A fever or chills. ? Pain that does not get better with medicine. ? Swelling that gets worse. Get help right away if:  Your hand or fingernails: ? Turn blue or gray. ? Feel cold or numb.  You have tingling or numbness in your fingers, even after you loosen your splint (if this applies). Summary  Colles fracture is a type of broken wrist. It often involves both of the bones in the forearm (radius and ulna).  Fractures are common in young, active people who are involved in high-energy activities. They are also common in older people who are at risk for osteoporosis.  This injury is diagnosed with a physical exam and X-rays.  Your wrist will need to be held in place (immobilized) with a splint or cast for several weeks. You may need surgery for a more severe fracture. This information is not intended to replace  advice given to you by your health care provider. Make sure you discuss any questions you have with your health care provider. Document Revised: 04/09/2017 Document Reviewed: 03/26/2017 Elsevier Patient Education  2020 [employer].

## 2020-03-27 NOTE — Telephone Encounter (Signed)
Referral placed.

## 2020-03-27 NOTE — Telephone Encounter (Signed)
Ok to refer to hand surgery.

## 2020-03-28 DIAGNOSIS — M13849 Other specified arthritis, unspecified hand: Secondary | ICD-10-CM | POA: Diagnosis not present

## 2020-03-28 DIAGNOSIS — M25532 Pain in left wrist: Secondary | ICD-10-CM | POA: Diagnosis not present

## 2020-03-28 DIAGNOSIS — S52502A Unspecified fracture of the lower end of left radius, initial encounter for closed fracture: Secondary | ICD-10-CM | POA: Diagnosis not present

## 2020-03-29 DIAGNOSIS — S52572A Other intraarticular fracture of lower end of left radius, initial encounter for closed fracture: Secondary | ICD-10-CM | POA: Diagnosis not present

## 2020-04-11 ENCOUNTER — Ambulatory Visit: Payer: Self-pay | Admitting: Neurology

## 2020-04-11 DIAGNOSIS — S52572D Other intraarticular fracture of lower end of left radius, subsequent encounter for closed fracture with routine healing: Secondary | ICD-10-CM | POA: Diagnosis not present

## 2020-04-11 DIAGNOSIS — M25532 Pain in left wrist: Secondary | ICD-10-CM | POA: Diagnosis not present

## 2020-04-11 DIAGNOSIS — Z4789 Encounter for other orthopedic aftercare: Secondary | ICD-10-CM | POA: Diagnosis not present

## 2020-04-25 DIAGNOSIS — Z4789 Encounter for other orthopedic aftercare: Secondary | ICD-10-CM | POA: Diagnosis not present

## 2020-04-25 DIAGNOSIS — M25532 Pain in left wrist: Secondary | ICD-10-CM | POA: Diagnosis not present

## 2020-04-25 DIAGNOSIS — S52572D Other intraarticular fracture of lower end of left radius, subsequent encounter for closed fracture with routine healing: Secondary | ICD-10-CM | POA: Diagnosis not present

## 2020-04-25 DIAGNOSIS — M25632 Stiffness of left wrist, not elsewhere classified: Secondary | ICD-10-CM | POA: Diagnosis not present

## 2020-04-26 ENCOUNTER — Telehealth: Payer: Self-pay | Admitting: Internal Medicine

## 2020-04-26 NOTE — Telephone Encounter (Signed)
I attempted prior authorization for lialda but it is currently available without authorization. I suspect patient is receiving a letter regarding insurance coverage for next years plan. I advised patient that since she is allergic to sulfa products, she would be ineligible to have sulfasalazine anyways, so she does not need to worry about being changed to that medication. I advised that should insurance require a prior authorization at the first of the year, she should let me know and I will work on it right away. She verbalizes understanding.

## 2020-04-26 NOTE — Telephone Encounter (Signed)
Inbound call from patient stating she received a letter from her insurance informing her they are going to switch her Lialda medication to Sulfasalazine.  Patient does not want to change her medication and is asking if there is anything we can do.  Please advise.

## 2020-05-01 ENCOUNTER — Ambulatory Visit: Payer: Self-pay | Admitting: Neurology

## 2020-05-02 DIAGNOSIS — M25632 Stiffness of left wrist, not elsewhere classified: Secondary | ICD-10-CM | POA: Diagnosis not present

## 2020-05-07 DIAGNOSIS — M25632 Stiffness of left wrist, not elsewhere classified: Secondary | ICD-10-CM | POA: Diagnosis not present

## 2020-05-08 DIAGNOSIS — M25632 Stiffness of left wrist, not elsewhere classified: Secondary | ICD-10-CM | POA: Diagnosis not present

## 2020-05-14 ENCOUNTER — Other Ambulatory Visit: Payer: Self-pay | Admitting: Internal Medicine

## 2020-05-15 DIAGNOSIS — M25632 Stiffness of left wrist, not elsewhere classified: Secondary | ICD-10-CM | POA: Diagnosis not present

## 2020-05-17 DIAGNOSIS — M25632 Stiffness of left wrist, not elsewhere classified: Secondary | ICD-10-CM | POA: Diagnosis not present

## 2020-05-21 DIAGNOSIS — M25632 Stiffness of left wrist, not elsewhere classified: Secondary | ICD-10-CM | POA: Diagnosis not present

## 2020-05-22 DIAGNOSIS — M25632 Stiffness of left wrist, not elsewhere classified: Secondary | ICD-10-CM | POA: Diagnosis not present

## 2020-05-22 DIAGNOSIS — S52572D Other intraarticular fracture of lower end of left radius, subsequent encounter for closed fracture with routine healing: Secondary | ICD-10-CM | POA: Diagnosis not present

## 2020-05-22 DIAGNOSIS — R200 Anesthesia of skin: Secondary | ICD-10-CM | POA: Diagnosis not present

## 2020-05-22 DIAGNOSIS — Z4789 Encounter for other orthopedic aftercare: Secondary | ICD-10-CM | POA: Diagnosis not present

## 2020-05-22 DIAGNOSIS — M7022 Olecranon bursitis, left elbow: Secondary | ICD-10-CM | POA: Diagnosis not present

## 2020-05-24 DIAGNOSIS — M25632 Stiffness of left wrist, not elsewhere classified: Secondary | ICD-10-CM | POA: Diagnosis not present

## 2020-05-28 DIAGNOSIS — M25632 Stiffness of left wrist, not elsewhere classified: Secondary | ICD-10-CM | POA: Diagnosis not present

## 2020-05-30 DIAGNOSIS — M25632 Stiffness of left wrist, not elsewhere classified: Secondary | ICD-10-CM | POA: Diagnosis not present

## 2020-06-05 DIAGNOSIS — M25632 Stiffness of left wrist, not elsewhere classified: Secondary | ICD-10-CM | POA: Diagnosis not present

## 2020-06-13 ENCOUNTER — Telehealth: Payer: Self-pay | Admitting: Internal Medicine

## 2020-06-13 NOTE — Telephone Encounter (Signed)
The patient called wanting to discuss somethings with Dr. Jerilee Hoh nurse. She stated that she broke her wrist and has to come to [facility] for PT and that she thinks that she has a UTI because it burns and some thing is not right.  I have her scheduled for 06/25/2020 with Dr. Jerilee Hoh  Please advise

## 2020-06-13 NOTE — Telephone Encounter (Signed)
Spoke with patient and reschedule appointment to an earlier date.

## 2020-06-19 ENCOUNTER — Encounter: Payer: Self-pay | Admitting: Internal Medicine

## 2020-06-19 ENCOUNTER — Ambulatory Visit: Payer: Medicare PPO | Admitting: Internal Medicine

## 2020-06-19 ENCOUNTER — Other Ambulatory Visit: Payer: Self-pay

## 2020-06-19 VITALS — BP 120/70 | HR 86 | Temp 97.7°F | Wt 112.9 lb

## 2020-06-19 DIAGNOSIS — N3001 Acute cystitis with hematuria: Secondary | ICD-10-CM | POA: Diagnosis not present

## 2020-06-19 DIAGNOSIS — M7022 Olecranon bursitis, left elbow: Secondary | ICD-10-CM | POA: Diagnosis not present

## 2020-06-19 DIAGNOSIS — Z4789 Encounter for other orthopedic aftercare: Secondary | ICD-10-CM | POA: Diagnosis not present

## 2020-06-19 DIAGNOSIS — S52572D Other intraarticular fracture of lower end of left radius, subsequent encounter for closed fracture with routine healing: Secondary | ICD-10-CM | POA: Diagnosis not present

## 2020-06-19 DIAGNOSIS — M25532 Pain in left wrist: Secondary | ICD-10-CM | POA: Diagnosis not present

## 2020-06-19 DIAGNOSIS — M25632 Stiffness of left wrist, not elsewhere classified: Secondary | ICD-10-CM | POA: Diagnosis not present

## 2020-06-19 LAB — POCT URINALYSIS DIPSTICK
Bilirubin, UA: NEGATIVE
Blood, UA: POSITIVE
Glucose, UA: NEGATIVE
Ketones, UA: NEGATIVE
Nitrite, UA: NEGATIVE
Protein, UA: POSITIVE — AB
Spec Grav, UA: 1.02 (ref 1.010–1.025)
Urobilinogen, UA: 0.2 E.U./dL
pH, UA: 6 (ref 5.0–8.0)

## 2020-06-19 MED ORDER — CIPROFLOXACIN HCL 250 MG PO TABS: 250.0000 mg | ORAL_TABLET | Freq: Two times a day (BID) | ORAL | 0 refills | Status: AC

## 2020-06-19 NOTE — Patient Instructions (Signed)
-Nice seeing you today!!  -Start cipro 250 mg twice daily for 7 days.   Urinary Tract Infection, Adult A urinary tract infection (UTI) is an infection of any part of the urinary tract. The urinary tract includes:  The kidneys.  The ureters.  The bladder.  The urethra. These organs make, store, and get rid of pee (urine) in the body. What are the causes? This infection is caused by germs (bacteria) in your genital area. These germs grow and cause swelling (inflammation) of your urinary tract. What increases the risk? The following factors may make you more likely to develop this condition:  Using a small, thin tube (catheter) to drain pee.  Not being able to control when you pee or poop (incontinence).  Being female. If you are female, these things can increase the risk: ? Using these methods to prevent pregnancy:  A medicine that kills sperm (spermicide).  A device that blocks sperm (diaphragm). ? Having low levels of a female hormone (estrogen). ? Being pregnant. You are more likely to develop this condition if:  You have genes that add to your risk.  You are sexually active.  You take antibiotic medicines.  You have trouble peeing because of: ? A prostate that is bigger than normal, if you are female. ? A blockage in the part of your body that drains pee from the bladder. ? A kidney stone. ? A nerve condition that affects your bladder. ? Not getting enough to drink. ? Not peeing often enough.  You have other conditions, such as: ? Diabetes. ? A weak disease-fighting system (immune system). ? Sickle cell disease. ? Gout. ? Injury of the spine. What are the signs or symptoms? Symptoms of this condition include:  Needing to pee right away.  Peeing small amounts often.  Pain or burning when peeing.  Blood in the pee.  Pee that smells bad or not like normal.  Trouble peeing.  Pee that is cloudy.  Fluid coming from the vagina, if you are  female.  Pain in the belly or lower back. Other symptoms include:  Vomiting.  Not feeling hungry.  Feeling mixed up (confused). This may be the first symptom in older adults.  Being tired and grouchy (irritable).  A fever.  Watery poop (diarrhea). How is this treated?  Taking antibiotic medicine.  Taking other medicines.  Drinking enough water. In some cases, you may need to see a specialist. Follow these instructions at home: Medicines  Take over-the-counter and prescription medicines only as told by your doctor.  If you were prescribed an antibiotic medicine, take it as told by your doctor. Do not stop taking it even if you start to feel better. General instructions  Make sure you: ? Pee until your bladder is empty. ? Do not hold pee for a long time. ? Empty your bladder after sex. ? Wipe from front to back after peeing or pooping if you are a female. Use each tissue one time when you wipe.  Drink enough fluid to keep your pee pale yellow.  Keep all follow-up visits.   Contact a doctor if:  You do not get better after 1-2 days.  Your symptoms go away and then come back. Get help right away if:  You have very bad back pain.  You have very bad pain in your lower belly.  You have a fever.  You have chills.  You feeling like you will vomit or you vomit. Summary  A urinary tract infection (UTI)  is an infection of any part of the urinary tract.  This condition is caused by germs in your genital area.  There are many risk factors for a UTI.  Treatment includes antibiotic medicines.  Drink enough fluid to keep your pee pale yellow. This information is not intended to replace advice given to you by your health care provider. Make sure you discuss any questions you have with your health care provider. Document Revised: 12/08/2019 Document Reviewed: 12/08/2019 Elsevier Patient Education  [employer].

## 2020-06-19 NOTE — Progress Notes (Signed)
Established Patient Office Visit     This visit occurred during the SARS-CoV-2 public health emergency.  Safety protocols were in place, including screening questions prior to the visit, additional usage of staff PPE, and extensive cleaning of exam room while observing appropriate contact time as indicated for disinfecting solutions.    CC/Reason for Visit: Dysuria  HPI: Rachel Walton is a 87 y.o. female who is coming in today for the above mentioned reasons.  For the past week or so she has been having dysuria with increased urinary frequency and has been waking up several times at night to urinate.  She has no abdominal pain, no nausea, no vomiting, no fever.  She wants me to know that in October she fractured her left wrist and ended up having surgery by Dr. Amedeo Plenty.  All went well and she is currently undergoing physical therapy.   Past Medical/Surgical History: Past Medical History:  Diagnosis Date  . Acute gastric ulcer   . Allergy   . Anxiety   . Atrophic vaginitis   . Barrett's esophagus   . Basal cell carcinoma of nose     under nose  . Cataract   . Depression   . Diverticulosis   . Esophageal stricture   . Esophageal ulcer   . Fatty liver   . GERD (gastroesophageal reflux disease)   . Heart murmur   . Hiatal hernia   . Hx of adenomatous colonic polyps   . Hyperlipidemia   . Hypertension   . Hypokalemia   . IBS (irritable bowel syndrome)   . Kyphosis   . Lymphocytic colitis   . Macular degeneration    "?wet or dry; ? both eyes"  . 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 Osteopenia of the elderly   . Pneumonia X 1  . Postmenopausal   . Recurrent UTI   . 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 Tremor   . Vitamin D deficiency     Past Surgical History:  Procedure Laterality Date  . ABDOMINAL HYSTERECTOMY  1980  . APPENDECTOMY    . BACK SURGERY    . BREAST BIOPSY Left   . CATARACT EXTRACTION W/ INTRAOCULAR LENS  IMPLANT, BILATERAL  Bilateral   . COLONOSCOPY    . ESOPHAGOGASTRODUODENOSCOPY (EGD) WITH ESOPHAGEAL DILATION  X 1  . EYE SURGERY    . LUMBAR DISC SURGERY     "fragmented disc"  . POLYPECTOMY    . SQUAMOUS CELL CARCINOMA EXCISION     nose tip    Social History:  reports that she has never smoked. She has never used smokeless tobacco. She reports that she does not drink alcohol and does not use drugs.  Allergies: Allergies  Allergen Reactions  . Alendronate Sodium Other (See Comments)    Caused chest pain  . Lexapro [Escitalopram Oxalate] Other (See Comments)    Makes lymphangitic colitis worse  . Amoxicillin Other (See Comments)    diarrhea  . Aspirin Other (See Comments)    Per the patient, made the gastric ulcer worse.  . Cephalexin   . Clindamycin/Lincomycin   . Keflex [Cephalexin]   . Levofloxacin Other (See Comments)    Insomnia and headache  . Prednisone Other (See Comments)    Reaction is unknown..then states nervousness  . Sudafed [Pseudoephedrine Hcl] Other (See Comments)    insomnia  . Sulfa Antibiotics Hives  . Toprol Xl [Metoprolol Succinate] Other (See Comments)    Unknown  reaction  . Trimethoprim     Unknown reaction  . Zithromax [Azithromycin] Rash    Family History:  Family History  Problem Relation Age of Onset  . Stroke Mother   . Heart disease Father        MI  . Heart attack Father   . Cancer Sister        Brain tumor  . Hypertension Sister   . Osteoporosis Sister   . Colon cancer Brother   . Heart disease Brother   . Diabetes Son 8  . Diabetes Maternal Uncle   . Diabetes Son 72  . Hypertension Sister   . Breast cancer Sister 48  . Hypertension Sister   . Cancer Sister        breast  . Osteoporosis Sister   . Hypertension Sister   . Hypertension Sister   . Stroke Sister   . Hypertension Sister   . Hypertension Sister   . Colon cancer Other        nephew  . Brain cancer Son        deceased  . Cancer Son        brain  . Thyroid cancer Other         niece  . Breast cancer Other   . Breast cancer Other   . Breast cancer Other   . Colon polyps Neg Hx   . Esophageal cancer Neg Hx   . Rectal cancer Neg Hx   . Stomach cancer Neg Hx      Current Outpatient Medications:  .  acetaminophen (TYLENOL) 325 MG tablet, Take 650 mg by mouth every 6 (six) hours as needed., Disp: , Rfl:  .  amLODipine (NORVASC) 5 MG tablet, Take 1 tablet (5 mg total) by mouth daily., Disp: 30 tablet, Rfl: 5 .  atorvastatin (LIPITOR) 10 MG tablet, Take 1 tablet (10 mg total) by mouth daily., Disp: 90 tablet, Rfl: 1 .  ciprofloxacin (CIPRO) 250 MG tablet, Take 1 tablet (250 mg total) by mouth 2 (two) times daily for 7 days., Disp: 14 tablet, Rfl: 0 .  clonazePAM (KLONOPIN) 0.5 MG tablet, TAKE 1/2 TO 1 TABLETS BY MOUTH 2 TIMES DAILY AS NEEDED FOR ANXIETY (Patient taking differently: Take 0.5 mg by mouth 2 (two) times daily.), Disp: 180 tablet, Rfl: 0 .  fexofenadine (ALLEGRA) 180 MG tablet, Take 180 mg by mouth daily., Disp: , Rfl:  .  fluticasone (FLONASE) 50 MCG/ACT nasal spray, Place 2 sprays into both nostrils daily., Disp: 16 g, Rfl: 6 .  lisinopril (ZESTRIL) 20 MG tablet, TAKE ONE (1) TABLET EACH DAY (Patient taking differently: Take 20 mg by mouth daily.), Disp: 90 tablet, Rfl: 1 .  mesalamine (LIALDA) 1.2 g EC tablet, Take 2 tablets (2.4 g total) by mouth daily. MUST HAVE OFFICE VISIT FOR FURTHER REFILLS, Disp: 60 tablet, Rfl: 0 .  OVER THE COUNTER MEDICATION, Vitamin D-3  2000 units daily, Disp: , Rfl:  .  OVER THE COUNTER MEDICATION, Stress B with Zinc, Disp: , Rfl:  .  potassium chloride (KLOR-CON) 10 MEQ tablet, TAKE ONE (1) TABLET EACH DAY, Disp: 90 tablet, Rfl: 1 .  vitamin B-12 (CYANOCOBALAMIN) 1000 MCG tablet, Take 1,000 mcg by mouth daily., Disp: , Rfl:  .  Wheat Dextrin (BENEFIBER) POWD, Take 1 Dose by mouth daily as needed. , Disp: , Rfl:   Review of Systems:  Constitutional: Denies fever, chills, diaphoresis, appetite change and fatigue.  HEENT:  Denies photophobia, eye pain, redness, hearing loss,  ear pain, congestion, sore throat, rhinorrhea, sneezing, mouth sores, trouble swallowing, neck pain, neck stiffness and tinnitus.   Respiratory: Denies SOB, DOE, cough, chest tightness,  and wheezing.   Cardiovascular: Denies chest pain, palpitations and leg swelling.  Gastrointestinal: Denies nausea, vomiting, abdominal pain, diarrhea, constipation, blood in stool and abdominal distention.  Genitourinary: Positive for dysuria, urgency, frequency, hematuria, denies flank pain and difficulty urinating.  Endocrine: Denies: hot or cold intolerance, sweats, changes in hair or nails, polyuria, polydipsia. Musculoskeletal: Denies myalgias, back pain, joint swelling, arthralgias and gait problem.  Skin: Denies pallor, rash and wound.  Neurological: Denies dizziness, seizures, syncope, weakness, light-headedness, numbness and headaches.  Hematological: Denies adenopathy. Easy bruising, personal or family bleeding history  Psychiatric/Behavioral: Denies suicidal ideation, mood changes, confusion, nervousness, sleep disturbance and agitation    Physical Exam: Vitals:   06/19/20 1531  BP: 120/70  Pulse: 86  Temp: 97.7 F (36.5 C)  TempSrc: Oral  SpO2: 98%  Weight: 112 lb 14.4 oz (51.2 kg)    Body mass index is 21.33 kg/m.   Constitutional: NAD, calm, comfortable Eyes: PERRL, lids and conjunctivae normal ENMT: Mucous membranes are moist.  Respiratory: clear to auscultation bilaterally, no wheezing, no crackles. Normal respiratory effort. No accessory muscle use.  Cardiovascular: Regular rate and rhythm, no murmurs / rubs / gallops. No extremity edema.  Psychiatric: Normal judgment and insight. Alert and oriented x 3. Normal mood.    Impression and Plan:  Acute cystitis with hematuria -Urine dipstick in office today positive for blood, leukocytes. -Cipro 250 mg twice daily for 7 days, will send for UA and culture.    Patient  Instructions   -Nice seeing you today!!  -Start cipro 250 mg twice daily for 7 days.   Urinary Tract Infection, Adult A urinary tract infection (UTI) is an infection of any part of the urinary tract. The urinary tract includes:  The kidneys.  The ureters.  The bladder.  The urethra. These organs make, store, and get rid of pee (urine) in the body. What are the causes? This infection is caused by germs (bacteria) in your genital area. These germs grow and cause swelling (inflammation) of your urinary tract. What increases the risk? The following factors may make you more likely to develop this condition:  Using a small, thin tube (catheter) to drain pee.  Not being able to control when you pee or poop (incontinence).  Being female. If you are female, these things can increase the risk: ? Using these methods to prevent pregnancy:  A medicine that kills sperm (spermicide).  A device that blocks sperm (diaphragm). ? Having low levels of a female hormone (estrogen). ? Being pregnant. You are more likely to develop this condition if:  You have genes that add to your risk.  You are sexually active.  You take antibiotic medicines.  You have trouble peeing because of: ? A prostate that is bigger than normal, if you are female. ? A blockage in the part of your body that drains pee from the bladder. ? A kidney stone. ? A nerve condition that affects your bladder. ? Not getting enough to drink. ? Not peeing often enough.  You have other conditions, such as: ? Diabetes. ? A weak disease-fighting system (immune system). ? Sickle cell disease. ? Gout. ? Injury of the spine. What are the signs or symptoms? Symptoms of this condition include:  Needing to pee right away.  Peeing small amounts often.  Pain or burning when peeing.  Blood in the pee.  Pee that smells bad or not like normal.  Trouble peeing.  Pee that is cloudy.  Fluid coming from the vagina, if you  are female.  Pain in the belly or lower back. Other symptoms include:  Vomiting.  Not feeling hungry.  Feeling mixed up (confused). This may be the first symptom in older adults.  Being tired and grouchy (irritable).  A fever.  Watery poop (diarrhea). How is this treated?  Taking antibiotic medicine.  Taking other medicines.  Drinking enough water. In some cases, you may need to see a specialist. Follow these instructions at home: Medicines  Take over-the-counter and prescription medicines only as told by your doctor.  If you were prescribed an antibiotic medicine, take it as told by your doctor. Do not stop taking it even if you start to feel better. General instructions  Make sure you: ? Pee until your bladder is empty. ? Do not hold pee for a long time. ? Empty your bladder after sex. ? Wipe from front to back after peeing or pooping if you are a female. Use each tissue one time when you wipe.  Drink enough fluid to keep your pee pale yellow.  Keep all follow-up visits.   Contact a doctor if:  You do not get better after 1-2 days.  Your symptoms go away and then come back. Get help right away if:  You have very bad back pain.  You have very bad pain in your lower belly.  You have a fever.  You have chills.  You feeling like you will vomit or you vomit. Summary  A urinary tract infection (UTI) is an infection of any part of the urinary tract.  This condition is caused by germs in your genital area.  There are many risk factors for a UTI.  Treatment includes antibiotic medicines.  Drink enough fluid to keep your pee pale yellow. This information is not intended to replace advice given to you by your health care provider. Make sure you discuss any questions you have with your health care provider. Document Revised: 12/08/2019 Document Reviewed: 12/08/2019 Elsevier Patient Education  2021 [place],  MD [facility]

## 2020-06-20 LAB — URINALYSIS, ROUTINE W REFLEX MICROSCOPIC
Bilirubin Urine: NEGATIVE
Ketones, ur: NEGATIVE
Nitrite: NEGATIVE
Specific Gravity, Urine: 1.02 (ref 1.000–1.030)
Total Protein, Urine: NEGATIVE
Urine Glucose: NEGATIVE
Urobilinogen, UA: 0.2 (ref 0.0–1.0)
pH: 5.5 (ref 5.0–8.0)

## 2020-06-20 LAB — URINE CULTURE
MICRO NUMBER:: 11514233
Result:: NO GROWTH
SPECIMEN QUALITY:: ADEQUATE

## 2020-06-21 ENCOUNTER — Telehealth: Payer: Self-pay

## 2020-06-21 ENCOUNTER — Other Ambulatory Visit: Payer: Self-pay | Admitting: *Deleted

## 2020-06-21 DIAGNOSIS — I10 Essential (primary) hypertension: Secondary | ICD-10-CM

## 2020-06-21 MED ORDER — LISINOPRIL 20 MG PO TABS: ORAL_TABLET | ORAL | 1 refills | Status: DC

## 2020-06-21 NOTE — Telephone Encounter (Signed)
Spoke with patient and she states her pulse ranges from 79-97 and her temp 96.9.  She was feeling a little anxious because of all the paperwork she had.  She is feeling better now.

## 2020-06-21 NOTE — Telephone Encounter (Signed)
Patient called in stating that she hasn't been feeling very well since taking some medication and would like the nurse to call her back.    Cipro 250mg     Please call and advise

## 2020-06-25 ENCOUNTER — Ambulatory Visit: Payer: Medicare PPO | Admitting: Family Medicine

## 2020-06-25 ENCOUNTER — Ambulatory Visit: Payer: Medicare PPO | Admitting: Internal Medicine

## 2020-07-05 ENCOUNTER — Telehealth: Payer: Self-pay | Admitting: Internal Medicine

## 2020-07-05 DIAGNOSIS — R300 Dysuria: Secondary | ICD-10-CM

## 2020-07-05 DIAGNOSIS — M25632 Stiffness of left wrist, not elsewhere classified: Secondary | ICD-10-CM | POA: Diagnosis not present

## 2020-07-05 NOTE — Telephone Encounter (Signed)
Patient states she completed her medication, but the uti is back. Please advise.

## 2020-07-05 NOTE — Telephone Encounter (Signed)
Pt call and stated she still think she have the uti and want to come and leave a urine sample she stated you can leave her a voice message on the home phone.

## 2020-07-05 NOTE — Telephone Encounter (Signed)
Left detailed message on machine for patient to drop off a urine sample. Order placed.

## 2020-07-05 NOTE — Telephone Encounter (Signed)
Please drop off another urine sample and further recs to follow.

## 2020-07-09 ENCOUNTER — Other Ambulatory Visit: Payer: Self-pay | Admitting: Internal Medicine

## 2020-07-09 ENCOUNTER — Other Ambulatory Visit: Payer: Self-pay

## 2020-07-09 ENCOUNTER — Other Ambulatory Visit (INDEPENDENT_AMBULATORY_CARE_PROVIDER_SITE_OTHER): Payer: Medicare PPO

## 2020-07-09 ENCOUNTER — Other Ambulatory Visit (INDEPENDENT_AMBULATORY_CARE_PROVIDER_SITE_OTHER): Payer: Medicare PPO | Admitting: Internal Medicine

## 2020-07-09 DIAGNOSIS — R300 Dysuria: Secondary | ICD-10-CM | POA: Diagnosis not present

## 2020-07-09 LAB — URINALYSIS
Bilirubin Urine: NEGATIVE
Hgb urine dipstick: NEGATIVE
Ketones, ur: NEGATIVE
Leukocytes,Ua: NEGATIVE
Nitrite: NEGATIVE
Specific Gravity, Urine: 1.01 (ref 1.000–1.030)
Total Protein, Urine: NEGATIVE
Urine Glucose: NEGATIVE
Urobilinogen, UA: 0.2 (ref 0.0–1.0)
pH: 6 (ref 5.0–8.0)

## 2020-07-09 LAB — POCT URINALYSIS DIPSTICK
Bilirubin, UA: NEGATIVE
Blood, UA: POSITIVE
Glucose, UA: NEGATIVE
Ketones, UA: NEGATIVE
Leukocytes, UA: NEGATIVE
Nitrite, UA: NEGATIVE
Protein, UA: NEGATIVE
Spec Grav, UA: 1.015 (ref 1.010–1.025)
Urobilinogen, UA: 0.2 E.U./dL
pH, UA: 6 (ref 5.0–8.0)

## 2020-07-10 LAB — URINE CULTURE
MICRO NUMBER:: 11592597
Result:: NO GROWTH
SPECIMEN QUALITY:: ADEQUATE

## 2020-07-12 ENCOUNTER — Other Ambulatory Visit: Payer: Self-pay | Admitting: Internal Medicine

## 2020-07-12 DIAGNOSIS — R300 Dysuria: Secondary | ICD-10-CM

## 2020-07-12 DIAGNOSIS — N3001 Acute cystitis with hematuria: Secondary | ICD-10-CM

## 2020-07-31 DIAGNOSIS — S52572D Other intraarticular fracture of lower end of left radius, subsequent encounter for closed fracture with routine healing: Secondary | ICD-10-CM | POA: Diagnosis not present

## 2020-07-31 DIAGNOSIS — M7022 Olecranon bursitis, left elbow: Secondary | ICD-10-CM | POA: Diagnosis not present

## 2020-07-31 DIAGNOSIS — R200 Anesthesia of skin: Secondary | ICD-10-CM | POA: Diagnosis not present

## 2020-07-31 DIAGNOSIS — M25532 Pain in left wrist: Secondary | ICD-10-CM | POA: Diagnosis not present

## 2020-08-05 ENCOUNTER — Other Ambulatory Visit: Payer: Self-pay | Admitting: Internal Medicine

## 2020-08-06 ENCOUNTER — Ambulatory Visit: Payer: Medicare PPO | Admitting: Internal Medicine

## 2020-08-13 ENCOUNTER — Ambulatory Visit: Payer: Medicare PPO | Admitting: Internal Medicine

## 2020-09-06 ENCOUNTER — Ambulatory Visit: Payer: Medicare PPO | Admitting: Family Medicine

## 2020-09-06 ENCOUNTER — Encounter: Payer: Self-pay | Admitting: Family Medicine

## 2020-09-06 ENCOUNTER — Other Ambulatory Visit: Payer: Self-pay

## 2020-09-06 VITALS — BP 153/68 | HR 71 | Temp 97.4°F | Resp 20 | Ht 61.0 in | Wt 107.0 lb

## 2020-09-06 DIAGNOSIS — H6593 Unspecified nonsuppurative otitis media, bilateral: Secondary | ICD-10-CM | POA: Diagnosis not present

## 2020-09-06 DIAGNOSIS — L719 Rosacea, unspecified: Secondary | ICD-10-CM

## 2020-09-06 DIAGNOSIS — H9190 Unspecified hearing loss, unspecified ear: Secondary | ICD-10-CM | POA: Diagnosis not present

## 2020-09-06 MED ORDER — METRONIDAZOLE 1 % EX GEL: Freq: Every day | CUTANEOUS | 11 refills | Status: DC

## 2020-09-06 MED ORDER — DOXYCYCLINE HYCLATE 100 MG PO CAPS: 100.0000 mg | ORAL_CAPSULE | Freq: Two times a day (BID) | ORAL | 0 refills | Status: DC

## 2020-09-06 NOTE — Progress Notes (Signed)
Subjective:  Patient ID: Rachel Walton, female    DOB: 09-26-1929  Age: 87 y.o. MRN: [id number]  CC: redness of face  (At times (comes and goes) ) and Ear Fullness   HPI [name] presents for redness on cheeks and nose.  Its been coming and going for several months possibly years.  She notes that it was there yesterday.  It is a little better today but still present.  [name] reports no rashes elsewhere no fever chills or sweats no URI symptoms.  She is having some loss of hearing and earaches.  She wants her ears washed out today if possible.  Depression screen [facility] 2/9 09/06/2020 03/05/2020 05/04/2019  Decreased Interest 0 2 0  Down, Depressed, Hopeless 0 1 1  PHQ - 2 Score 0 3 1  Altered sleeping - 1 1  Tired, decreased energy - 3 1  Change in appetite - 2 2  Feeling bad or failure about yourself  - 2 0  Trouble concentrating - 0 1  Moving slowly or fidgety/restless - 0 0  Suicidal thoughts - 0 0  PHQ-9 Score - 11 6  Difficult doing work/chores - Not difficult at all Not difficult at all  Some recent data might be hidden    History Jennett has a past medical history of Acute gastric ulcer, Allergy, Anxiety, Atrophic vaginitis, Barrett's esophagus, Basal cell carcinoma of nose, Cataract, Depression, Diverticulosis, Esophageal stricture, Esophageal ulcer, Fatty liver, GERD (gastroesophageal reflux disease), Heart murmur, Hiatal hernia, adenomatous colonic polyps, Hyperlipidemia, Hypertension, Hypokalemia, IBS (irritable bowel syndrome), Kyphosis, Lymphocytic colitis, Macular degeneration, Meningoencephalitis (2005), Osteopenia of the elderly, Pneumonia (X 1), Postmenopausal, Recurrent UTI, 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   She has a past surgical history that includes Back surgery; Appendectomy; Cataract extraction w/ intraocular lens  implant, bilateral (Bilateral); Eye surgery; Colonoscopy; Polypectomy; Lumbar disc surgery; Squamous  cell carcinoma excision; Abdominal hysterectomy (1980); Esophagogastroduodenoscopy (egd) with esophageal dilation (X 1); and Breast biopsy (Left).   Her family history includes Brain cancer in her son; Breast cancer in some other family members; Breast cancer (age of onset: 39) in her sister; Cancer in her sister, sister, and son; Colon cancer in her brother and another family member; Diabetes in her maternal uncle; Diabetes (age of onset: 84) in her son; Diabetes (age of onset: 55) in her son; Heart attack in her father; Heart disease in her brother and father; Hypertension in her sister, sister, sister, sister, sister, sister, and sister; Osteoporosis in her sister and sister; Stroke in her mother and sister; Thyroid cancer in an other family member.She reports that she has never smoked. She has never used smokeless tobacco. She reports that she does not drink alcohol and does not use drugs.    ROS Review of Systems  Constitutional: Negative for appetite change, chills, diaphoresis, fatigue and fever.  HENT: Positive for ear pain and hearing loss. Negative for congestion, postnasal drip, rhinorrhea, sore throat and trouble swallowing.   Respiratory: Negative for cough, chest tightness and shortness of breath.   Cardiovascular: Negative for chest pain and palpitations.  Gastrointestinal: Negative for abdominal pain.  Musculoskeletal: Negative for arthralgias.  Skin: Negative for rash.    Objective:  BP (!) 153/68   Pulse 71   Temp (!) 97.4 F (36.3 C)   Resp 20   Ht 5\' 1"  (1.549 m)   Wt 107 lb (48.5 kg)   SpO2 97%   BMI 20.22 kg/m  BP Readings from Last 3 Encounters:  09/06/20 (!) 153/68  06/19/20 120/70  03/26/20 (!) 157/71    Wt Readings from Last 3 Encounters:  09/06/20 107 lb (48.5 kg)  06/19/20 112 lb 14.4 oz (51.2 kg)  03/26/20 118 lb (53.5 kg)     Physical Exam Constitutional:      Appearance: She is well-developed.  HENT:     Head: Normocephalic and atraumatic.      Right Ear: External ear normal. No decreased hearing noted.     Left Ear: External ear normal. No decreased hearing noted.     Ears:     Comments: Canals are free of wax there is fluid behind each TM with evidence for old scarring on the TMs.    Nose: Mucosal edema present.     Right Sinus: No frontal sinus tenderness.     Left Sinus: No frontal sinus tenderness.     Mouth/Throat:     Pharynx: No oropharyngeal exudate or posterior oropharyngeal erythema.  Neck:     Meningeal: Brudzinski's sign absent.  Pulmonary:     Effort: No respiratory distress.     Breath sounds: Normal breath sounds.  Lymphadenopathy:     Head:     Right side of head: No preauricular adenopathy.     Left side of head: No preauricular adenopathy.     Cervical:     Right cervical: No superficial cervical adenopathy.    Left cervical: No superficial cervical adenopathy.  Skin:    General: Skin is warm and dry.     Findings: Erythema (telangectasia of cheeks and nose) present.       Assessment & Plan:   Amelie was seen today for redness of face  and ear fullness.  Diagnoses and all orders for this visit:  Rosacea  SOM (secretory otitis media), bilateral -     Ambulatory referral to ENT  Hearing loss, unspecified hearing loss type, unspecified laterality -     Ambulatory referral to ENT  Other orders -     doxycycline (VIBRAMYCIN) 100 MG capsule; Take 1 capsule (100 mg total) by mouth 2 (two) times daily. -     metroNIDAZOLE (METROGEL) 1 % gel; Apply topically daily. To affected areas of face       I am having Chianna R. Wolven start on doxycycline and metroNIDAZOLE. I am also having her maintain her Benefiber, fexofenadine, OVER THE COUNTER MEDICATION, vitamin B-12, OVER THE COUNTER MEDICATION, fluticasone, amLODipine, potassium chloride, clonazePAM, atorvastatin, acetaminophen, lisinopril, and mesalamine.  Allergies as of 09/06/2020      Reactions   Alendronate Sodium Other (See Comments)    Caused chest pain   Lexapro [escitalopram Oxalate] Other (See Comments)   Makes lymphangitic colitis worse   Amoxicillin Other (See Comments)   diarrhea   Aspirin Other (See Comments)   Per the patient, made the gastric ulcer worse.   Cephalexin    Clindamycin/lincomycin    Keflex [cephalexin]    Levofloxacin Other (See Comments)   Insomnia and headache   Prednisone Other (See Comments)   Reaction is unknown..then states nervousness   Sudafed [pseudoephedrine Hcl] Other (See Comments)   insomnia   Sulfa Antibiotics Hives   Toprol Xl [metoprolol Succinate] Other (See Comments)   Unknown reaction   Trimethoprim    Unknown reaction   Zithromax [azithromycin] Rash      Medication List       Accurate as of September 06, 2020  8:47 AM. If you have any  questions, ask your nurse or doctor.        acetaminophen 325 MG tablet Commonly known as: TYLENOL Take 650 mg by mouth every 6 (six) hours as needed.   amLODipine 5 MG tablet Commonly known as: NORVASC Take 1 tablet (5 mg total) by mouth daily.   atorvastatin 10 MG tablet Commonly known as: LIPITOR Take 1 tablet (10 mg total) by mouth daily.   Benefiber Powd Take 1 Dose by mouth daily as needed.   clonazePAM 0.5 MG tablet Commonly known as: KLONOPIN TAKE 1/2 TO 1 TABLETS BY MOUTH 2 TIMES DAILY AS NEEDED FOR ANXIETY What changed: See the new instructions.   doxycycline 100 MG capsule Commonly known as: Vibramycin Take 1 capsule (100 mg total) by mouth 2 (two) times daily. Started by: Claretta Fraise, MD   fexofenadine 180 MG tablet Commonly known as: ALLEGRA Take 180 mg by mouth daily.   fluticasone 50 MCG/ACT nasal spray Commonly known as: FLONASE Place 2 sprays into both nostrils daily.   lisinopril 20 MG tablet Commonly known as: ZESTRIL TAKE ONE (1) TABLET EACH DAY   mesalamine 1.2 g EC tablet Commonly known as: LIALDA Take 2 tablets (2.4 g total) by mouth daily. MUST HAVE OFFICE VISIT FOR FURTHER  REFILLS   metroNIDAZOLE 1 % gel Commonly known as: Metrogel Apply topically daily. To affected areas of face Started by: Claretta Fraise, MD   OVER THE COUNTER MEDICATION Vitamin D-3  2000 units daily   OVER THE COUNTER MEDICATION Stress B with Zinc   potassium chloride 10 MEQ tablet Commonly known as: KLOR-CON TAKE ONE (1) TABLET EACH DAY   vitamin B-12 1000 MCG tablet Commonly known as: CYANOCOBALAMIN Take 1,000 mcg by mouth daily.        Follow-up: Return if symptoms worsen or fail to improve.  Claretta Fraise, M.D.

## 2020-09-11 DIAGNOSIS — S52502A Unspecified fracture of the lower end of left radius, initial encounter for closed fracture: Secondary | ICD-10-CM | POA: Diagnosis not present

## 2020-09-12 ENCOUNTER — Telehealth: Payer: Self-pay

## 2020-09-12 ENCOUNTER — Ambulatory Visit: Payer: Medicare PPO | Admitting: Family Medicine

## 2020-09-13 ENCOUNTER — Ambulatory Visit (HOSPITAL_COMMUNITY)
Admission: RE | Admit: 2020-09-13 | Discharge: 2020-09-13 | Disposition: A | Discharge status: Home / Self Care | Payer: Medicare PPO | Attending: Orthopedic Surgery | Admitting: Orthopedic Surgery

## 2020-09-13 ENCOUNTER — Other Ambulatory Visit: Payer: Self-pay

## 2020-09-13 ENCOUNTER — Ambulatory Visit (HOSPITAL_COMMUNITY): Payer: Medicare PPO | Admitting: Certified Registered"

## 2020-09-13 ENCOUNTER — Ambulatory Visit (HOSPITAL_COMMUNITY): Payer: Medicare PPO

## 2020-09-13 ENCOUNTER — Encounter (HOSPITAL_COMMUNITY): Payer: Self-pay | Admitting: Orthopedic Surgery

## 2020-09-13 ENCOUNTER — Encounter (HOSPITAL_COMMUNITY)
Admission: RE | Disposition: A | Discharge status: Home / Self Care | Payer: Self-pay | Source: Home / Self Care | Attending: Orthopedic Surgery

## 2020-09-13 DIAGNOSIS — Z8249 Family history of ischemic heart disease and other diseases of the circulatory system: Secondary | ICD-10-CM | POA: Insufficient documentation

## 2020-09-13 DIAGNOSIS — S52392A Other fracture of shaft of radius, left arm, initial encounter for closed fracture: Secondary | ICD-10-CM | POA: Diagnosis not present

## 2020-09-13 DIAGNOSIS — G5602 Carpal tunnel syndrome, left upper limb: Secondary | ICD-10-CM | POA: Insufficient documentation

## 2020-09-13 DIAGNOSIS — Z882 Allergy status to sulfonamides status: Secondary | ICD-10-CM | POA: Insufficient documentation

## 2020-09-13 DIAGNOSIS — Z79899 Other long term (current) drug therapy: Secondary | ICD-10-CM | POA: Diagnosis not present

## 2020-09-13 DIAGNOSIS — Z90710 Acquired absence of both cervix and uterus: Secondary | ICD-10-CM | POA: Diagnosis not present

## 2020-09-13 DIAGNOSIS — Z20822 Contact with and (suspected) exposure to covid-19: Secondary | ICD-10-CM | POA: Diagnosis not present

## 2020-09-13 DIAGNOSIS — Z888 Allergy status to other drugs, medicaments and biological substances status: Secondary | ICD-10-CM | POA: Diagnosis not present

## 2020-09-13 DIAGNOSIS — Z880 Allergy status to penicillin: Secondary | ICD-10-CM | POA: Insufficient documentation

## 2020-09-13 DIAGNOSIS — S52302A Unspecified fracture of shaft of left radius, initial encounter for closed fracture: Secondary | ICD-10-CM | POA: Diagnosis not present

## 2020-09-13 DIAGNOSIS — Z800 Family history of malignant neoplasm of digestive organs: Secondary | ICD-10-CM | POA: Insufficient documentation

## 2020-09-13 DIAGNOSIS — E785 Hyperlipidemia, unspecified: Secondary | ICD-10-CM | POA: Insufficient documentation

## 2020-09-13 DIAGNOSIS — Z86010 Personal history of colonic polyps: Secondary | ICD-10-CM | POA: Insufficient documentation

## 2020-09-13 DIAGNOSIS — Z881 Allergy status to other antibiotic agents status: Secondary | ICD-10-CM | POA: Insufficient documentation

## 2020-09-13 DIAGNOSIS — S52502D Unspecified fracture of the lower end of left radius, subsequent encounter for closed fracture with routine healing: Secondary | ICD-10-CM | POA: Diagnosis not present

## 2020-09-13 DIAGNOSIS — K760 Fatty (change of) liver, not elsewhere classified: Secondary | ICD-10-CM | POA: Diagnosis not present

## 2020-09-13 DIAGNOSIS — Z886 Allergy status to analgesic agent status: Secondary | ICD-10-CM | POA: Insufficient documentation

## 2020-09-13 DIAGNOSIS — Z809 Family history of malignant neoplasm, unspecified: Secondary | ICD-10-CM | POA: Diagnosis not present

## 2020-09-13 DIAGNOSIS — X58XXXD Exposure to other specified factors, subsequent encounter: Secondary | ICD-10-CM | POA: Diagnosis not present

## 2020-09-13 DIAGNOSIS — I10 Essential (primary) hypertension: Secondary | ICD-10-CM | POA: Diagnosis not present

## 2020-09-13 DIAGNOSIS — Z85828 Personal history of other malignant neoplasm of skin: Secondary | ICD-10-CM | POA: Insufficient documentation

## 2020-09-13 DIAGNOSIS — Z808 Family history of malignant neoplasm of other organs or systems: Secondary | ICD-10-CM | POA: Insufficient documentation

## 2020-09-13 DIAGNOSIS — K219 Gastro-esophageal reflux disease without esophagitis: Secondary | ICD-10-CM | POA: Diagnosis not present

## 2020-09-13 DIAGNOSIS — T8489XA Other specified complication of internal orthopedic prosthetic devices, implants and grafts, initial encounter: Secondary | ICD-10-CM | POA: Diagnosis not present

## 2020-09-13 DIAGNOSIS — E559 Vitamin D deficiency, unspecified: Secondary | ICD-10-CM | POA: Diagnosis not present

## 2020-09-13 HISTORY — PX: ORIF RADIAL FRACTURE: SHX5113

## 2020-09-13 HISTORY — PX: CARPAL TUNNEL RELEASE: SHX101

## 2020-09-13 LAB — COMPREHENSIVE METABOLIC PANEL
ALT: 16 U/L (ref 0–44)
AST: 21 U/L (ref 15–41)
Albumin: 3.4 g/dL — ABNORMAL LOW (ref 3.5–5.0)
Alkaline Phosphatase: 45 U/L (ref 38–126)
Anion gap: 7 (ref 5–15)
BUN: 5 mg/dL — ABNORMAL LOW (ref 8–23)
CO2: 29 mmol/L (ref 22–32)
Calcium: 9 mg/dL (ref 8.9–10.3)
Chloride: 104 mmol/L (ref 98–111)
Creatinine, Ser: 0.82 mg/dL (ref 0.44–1.00)
GFR, Estimated: >60 mL/min (ref >60)
Glucose, Bld: 97 mg/dL (ref 70–99)
Potassium: 3.7 mmol/L (ref 3.5–5.1)
Sodium: 140 mmol/L (ref 135–145)
Total Bilirubin: 0.9 mg/dL (ref 0.3–1.2)
Total Protein: 6.2 g/dL — ABNORMAL LOW (ref 6.5–8.1)

## 2020-09-13 LAB — CBC
HCT: 41.8 % (ref 36.0–46.0)
Hemoglobin: 13.4 g/dL (ref 12.0–15.0)
MCH: 29.2 pg (ref 26.0–34.0)
MCHC: 32.1 g/dL (ref 30.0–36.0)
MCV: 91.1 fL (ref 80.0–100.0)
Platelets: 228 10*3/uL (ref 150–400)
RBC: 4.59 MIL/uL (ref 3.87–5.11)
RDW: 12.8 % (ref 11.5–15.5)
WBC: 5.1 10*3/uL (ref 4.0–10.5)
nRBC: 0 % (ref 0.0–0.2)

## 2020-09-13 LAB — SARS CORONAVIRUS 2 BY RT PCR (HOSPITAL ORDER, PERFORMED IN ~~LOC~~ HOSPITAL LAB): SARS Coronavirus 2: NEGATIVE

## 2020-09-13 SURGERY — OPEN REDUCTION INTERNAL FIXATION (ORIF) RADIAL FRACTURE
Anesthesia: Monitor Anesthesia Care | Site: Arm Lower | Laterality: Left

## 2020-09-13 MED ORDER — FENTANYL CITRATE (PF) 250 MCG/5ML IJ SOLN
INTRAMUSCULAR | Status: AC
  Filled 2020-09-13: qty 5

## 2020-09-13 MED ORDER — FENTANYL CITRATE (PF) 100 MCG/2ML IJ SOLN: 25.0000 ug | INTRAMUSCULAR | Status: DC | PRN

## 2020-09-13 MED ORDER — ORAL CARE MOUTH RINSE: 15.0000 mL | Freq: Once | OROMUCOSAL | Status: AC

## 2020-09-13 MED ORDER — OXYCODONE HCL 5 MG PO TABS: 5.0000 mg | ORAL_TABLET | Freq: Once | ORAL | Status: DC | PRN

## 2020-09-13 MED ORDER — PROPOFOL 500 MG/50ML IV EMUL
INTRAVENOUS | Status: DC | PRN
  Administered 2020-09-13: 50 ug/kg/min via INTRAVENOUS

## 2020-09-13 MED ORDER — CLONIDINE HCL (ANALGESIA) 100 MCG/ML EP SOLN
EPIDURAL | Status: DC | PRN
  Administered 2020-09-13: 50 ug

## 2020-09-13 MED ORDER — LACTATED RINGERS IV SOLN: INTRAVENOUS | Status: DC

## 2020-09-13 MED ORDER — OXYCODONE HCL 5 MG/5ML PO SOLN: 5.0000 mg | Freq: Once | ORAL | Status: DC | PRN

## 2020-09-13 MED ORDER — PROMETHAZINE HCL 25 MG/ML IJ SOLN: 6.2500 mg | INTRAMUSCULAR | Status: DC | PRN

## 2020-09-13 MED ORDER — LIDOCAINE 2% (20 MG/ML) 5 ML SYRINGE
INTRAMUSCULAR | Status: AC
  Filled 2020-09-13: qty 5

## 2020-09-13 MED ORDER — PROPOFOL 1000 MG/100ML IV EMUL
INTRAVENOUS | Status: AC
  Filled 2020-09-13: qty 100

## 2020-09-13 MED ORDER — SUCCINYLCHOLINE CHLORIDE 200 MG/10ML IV SOSY
PREFILLED_SYRINGE | INTRAVENOUS | Status: AC
  Filled 2020-09-13: qty 10

## 2020-09-13 MED ORDER — FENTANYL CITRATE (PF) 100 MCG/2ML IJ SOLN
50.0000 ug | Freq: Once | INTRAMUSCULAR | Status: AC
Start: 2020-09-13 — End: 2020-09-13

## 2020-09-13 MED ORDER — ROCURONIUM BROMIDE 10 MG/ML (PF) SYRINGE
PREFILLED_SYRINGE | INTRAVENOUS | Status: AC
  Filled 2020-09-13: qty 10

## 2020-09-13 MED ORDER — PROPOFOL 10 MG/ML IV BOLUS
INTRAVENOUS | Status: AC
  Filled 2020-09-13: qty 20

## 2020-09-13 MED ORDER — VANCOMYCIN HCL IN DEXTROSE 1-5 GM/200ML-% IV SOLN
1000.0000 mg | INTRAVENOUS | Status: AC
  Administered 2020-09-13: 1000 mg via INTRAVENOUS
  Filled 2020-09-13: qty 200

## 2020-09-13 MED ORDER — FENTANYL CITRATE (PF) 100 MCG/2ML IJ SOLN
INTRAMUSCULAR | Status: AC
  Administered 2020-09-13: 50 ug
  Filled 2020-09-13: qty 2

## 2020-09-13 MED ORDER — 0.9 % SODIUM CHLORIDE (POUR BTL) OPTIME
TOPICAL | Status: DC | PRN
  Administered 2020-09-13 (×2): 1000 mL

## 2020-09-13 MED ORDER — CHLORHEXIDINE GLUCONATE 0.12 % MT SOLN
15.0000 mL | Freq: Once | OROMUCOSAL | Status: AC
  Administered 2020-09-13: 15 mL via OROMUCOSAL
  Filled 2020-09-13: qty 15

## 2020-09-13 MED ORDER — BUPIVACAINE-EPINEPHRINE (PF) 0.5% -1:200000 IJ SOLN
INTRAMUSCULAR | Status: DC | PRN
  Administered 2020-09-13: 25 mL via PERINEURAL

## 2020-09-13 SURGICAL SUPPLY — 69 items
BIT DRILL 2.2 SS TIBIAL (BIT) ×1 IMPLANT
BLADE CLIPPER SURG (BLADE) IMPLANT
BNDG CMPR 9X4 STRL LF SNTH (GAUZE/BANDAGES/DRESSINGS) ×1
BNDG ELASTIC 3X5.8 VLCR STR LF (GAUZE/BANDAGES/DRESSINGS) ×6 IMPLANT
BNDG ELASTIC 4X5.8 VLCR STR LF (GAUZE/BANDAGES/DRESSINGS) ×4 IMPLANT
BNDG ESMARK 4X9 LF (GAUZE/BANDAGES/DRESSINGS) ×2 IMPLANT
BNDG GAUZE ELAST 4 BULKY (GAUZE/BANDAGES/DRESSINGS) ×4 IMPLANT
CORD BIPOLAR FORCEPS 12FT (ELECTRODE) ×2 IMPLANT
COVER SURGICAL LIGHT HANDLE (MISCELLANEOUS) ×2 IMPLANT
COVER WAND RF STERILE (DRAPES) ×1 IMPLANT
CUFF TOURN SGL QUICK 18X4 (TOURNIQUET CUFF) ×2 IMPLANT
CUFF TOURN SGL QUICK 24 (TOURNIQUET CUFF)
CUFF TRNQT CYL 24X4X16.5-23 (TOURNIQUET CUFF) IMPLANT
DECANTER SPIKE VIAL GLASS SM (MISCELLANEOUS) IMPLANT
DRAIN TLS ROUND 10FR (DRAIN) IMPLANT
DRAPE INCISE IOBAN 66X45 STRL (DRAPES) ×1 IMPLANT
DRAPE OEC MINIVIEW 54X84 (DRAPES) IMPLANT
DRAPE U-SHAPE 47X51 STRL (DRAPES) ×2 IMPLANT
DRSG ADAPTIC 3X8 NADH LF (GAUZE/BANDAGES/DRESSINGS) ×2 IMPLANT
GAUZE SPONGE 4X4 12PLY STRL (GAUZE/BANDAGES/DRESSINGS) ×2 IMPLANT
GAUZE XEROFORM 5X9 LF (GAUZE/BANDAGES/DRESSINGS) ×2 IMPLANT
GLOVE BIOGEL M 8.0 STRL (GLOVE) ×1 IMPLANT
GLOVE SS BIOGEL STRL SZ 8 (GLOVE) ×1 IMPLANT
GLOVE SUPERSENSE BIOGEL SZ 8 (GLOVE) ×1
GOWN STRL REUS W/ TWL LRG LVL3 (GOWN DISPOSABLE) ×3 IMPLANT
GOWN STRL REUS W/ TWL XL LVL3 (GOWN DISPOSABLE) ×3 IMPLANT
GOWN STRL REUS W/TWL LRG LVL3 (GOWN DISPOSABLE) ×2
GOWN STRL REUS W/TWL XL LVL3 (GOWN DISPOSABLE) ×2
K-WIRE 1.6 (WIRE) ×2
K-WIRE FX5X1.6XNS BN SS (WIRE) ×1
KIT BASIN OR (CUSTOM PROCEDURE TRAY) ×2 IMPLANT
KIT TURNOVER KIT B (KITS) ×2 IMPLANT
KWIRE FX5X1.6XNS BN SS (WIRE) IMPLANT
MANIFOLD NEPTUNE II (INSTRUMENTS) ×2 IMPLANT
NEEDLE 22X1 1/2 (OR ONLY) (NEEDLE) IMPLANT
NS IRRIG 1000ML POUR BTL (IV SOLUTION) ×3 IMPLANT
PACK ORTHO EXTREMITY (CUSTOM PROCEDURE TRAY) ×2 IMPLANT
PAD ARMBOARD 7.5X6 YLW CONV (MISCELLANEOUS) ×4 IMPLANT
PAD CAST 3X4 CTTN HI CHSV (CAST SUPPLIES) IMPLANT
PAD CAST 4YDX4 CTTN HI CHSV (CAST SUPPLIES) ×3 IMPLANT
PADDING CAST COTTON 3X4 STRL (CAST SUPPLIES) ×2
PADDING CAST COTTON 4X4 STRL (CAST SUPPLIES) ×6
PLATE LONG DVR LEFT (Plate) ×1 IMPLANT
SCREW LOCK 12X2.7X 3 LD (Screw) IMPLANT
SCREW LOCK 14X2.7X 3 LD TPR (Screw) IMPLANT
SCREW LOCK 18X2.7X 3 LD TPR (Screw) IMPLANT
SCREW LOCK 20X2.7X 3 LD TPR (Screw) IMPLANT
SCREW LOCK 22X2.7X 3 LD TPR (Screw) IMPLANT
SCREW LOCKING 2.7X11MM (Screw) ×1 IMPLANT
SCREW LOCKING 2.7X12MM (Screw) ×8 IMPLANT
SCREW LOCKING 2.7X13MM (Screw) ×3 IMPLANT
SCREW LOCKING 2.7X14 (Screw) ×4 IMPLANT
SCREW LOCKING 2.7X18 (Screw) ×8 IMPLANT
SCREW LOCKING 2.7X20MM (Screw) ×4 IMPLANT
SCREW LOCKING 2.7X22MM (Screw) ×2 IMPLANT
SOL PREP POV-IOD 4OZ 10% (MISCELLANEOUS) ×3 IMPLANT
SPONGE LAP 4X18 RFD (DISPOSABLE) IMPLANT
SUT MNCRL AB 4-0 PS2 18 (SUTURE) ×1 IMPLANT
SUT PROLENE 3 0 PS 2 (SUTURE) IMPLANT
SUT PROLENE 4 0 PS 2 18 (SUTURE) ×3 IMPLANT
SUT VIC AB 3-0 FS2 27 (SUTURE) IMPLANT
SYR CONTROL 10ML LL (SYRINGE) IMPLANT
SYSTEM CHEST DRAIN TLS 7FR (DRAIN) IMPLANT
TOWEL GREEN STERILE (TOWEL DISPOSABLE) ×2 IMPLANT
TOWEL GREEN STERILE FF (TOWEL DISPOSABLE) ×2 IMPLANT
TUBE CONNECTING 12X1/4 (SUCTIONS) ×2 IMPLANT
TUBE EVACUATION TLS (MISCELLANEOUS) ×2 IMPLANT
UNDERPAD 30X36 HEAVY ABSORB (UNDERPADS AND DIAPERS) ×2 IMPLANT
WATER STERILE IRR 1000ML POUR (IV SOLUTION) ×2 IMPLANT

## 2020-09-13 NOTE — OR Nursing (Signed)
Previous hardware put in at surgery center 03/2020. All previous hardware removed from the left arm and new hardware implanted. See surgery records for implants [facility].

## 2020-09-13 NOTE — Op Note (Signed)
Operative note Sep 13, 2020  Roseanne Kaufman MD  Preoperative diagnosis status post left distal radius ORIF with subsequent radial shaft fracture after a fall.  Initial index surgery November 2021 with subsequent fall days ago with a new fracture about the arm.  She also has carpal tunnel symptomatology intermittent in its expression.  Postop diagnosis: The same  Operative procedure #1 hardware removal in the form of a DVR plate and screw construct left wrist #2 brachial radialis sliding tenotomy left wrist #3 open reduction internal fixation radial shaft fracture with DVR extended plate and screw construct this was an extended plate and screw construct.  #4 5 view radiographic series performed examined and interpreted by myself left forearm #5 left open carpal tunnel release #6 tenolysis tendon synovectomy FPL and FCR with marked amount of adhesions left wrist/forearm  Anesthesia block with IV sedation  Estimated blood loss minimal  Tourniquet time less than 90 minutes  Surgeon Roseanne Kaufman  Description of procedure in detail: Patient was seen by myself and anesthesia she was counseled extensively in the operative arena and then taken to the surgical suite underwent to Hibiclens scrubs followed by Betadine scrub and paint.  Skin was stable there was no puncture wounds or compartment syndrome phenomenon.  The operation commenced with an extended volar radial approach to the radius.  I used the old incision and then coursed proximally along the FCR sheath.  Dissection was carried down very carefully.  At this time the patient had the FCR and the flexor pronator construct as well as the flexor pollicis longus evaluated.  Patient underwent a tenolysis tendon synovectomy of the FPL and FCR this was encased in scar tissue from the prior surgery distally.  Once the FCR tenolysis tendon synovectomy and FPL tenolysis tendon synovectomy was complex access the plate and screw construct I remove this without  difficulty.  This was a narrow volar rim Biomet DVR plate and screw construct.  This was removed without difficulty.  Following this I made an extension with the dissection and identified the radial shaft.  The radial shaft was fractured at the distal third.  I then assembled the fracture and applied a DVR plate and screw construct.  This was an extended DVR standard plate.  I was able to achieve coaptation of the cortical cancellous surfaces under compression with compression mode plating technique.  I performed a sliding brachial radialis tenotomy to lessen the deforming forces of the radius and the patient tolerated this well.  This was due to overpull of the distal fragment.  AP lateral and oblique x-rays/5 view x-ray series looked excellent I was pleased this and the findings.  Following this we then performed careful irrigation followed by final copy x-rays.  Following this I then made an incision and performed an open carpal tunnel release.  The patient has dynamic carpal tunnel symptomatology and given the swelling I did not want to have any nerve under duress.  The operation went without difficulty regarding the open carpal tunnel release.  The patient had a very tight thickened transverse carpal ligament.  Median nerve was protected at all times.  Following this tourniquet was deflated 1-1/2 L of irrigant were placed in the wound followed by closure of wound with Prolene.  Adaptic Xeroform 4 x 4 gauze Kerlix web roll and a sugar-tong splint was applied.  Carter arm pillow elevation move and massage fingers and see me back in the office in 2 weeks.  We will protect her indefinitely until  the fracture looks very stable.  All questions have been encouraged and answered.  Vester Titsworth MD

## 2020-09-13 NOTE — Discharge Instructions (Addendum)
Please remember to elevate move and massage her fingers.  If you have difficulties over the weekend call Dr. Amedeo Plenty on his cell phone at [phone]  Dr. Vanetta Shawl office will call you for your follow-up.  Elevate move and massage her fingers.  It is the most important aspect of the postop recovery.  We have called in pain medicine to the [facility] in [place] and have called in an antibiotic as well as a nausea medicine for you.  Please take these as directed.  Please use the pain medicine wisely.  Only use one type of pain medicine and not multiple tabs.  It is okay to take 2 tablets of the oxycodone every 4 hours.  Keep bandage clean and dry.  Call for any problems.  No smoking.  Criteria for driving a car: you should be off your pain medicine for 7-8 hours, able to drive one handed(confident), thinking clearly and feeling able in your judgement to drive. Continue elevation as it will decrease swelling.  If instructed by MD move your fingers within the confines of the bandage/splint.  Use ice if instructed by your MD. Call immediately for any sudden loss of feeling in your hand/arm or change in functional abilities of the extremity.We recommend that you to take vitamin C 1000 mg a day to promote healing. We also recommend that if you require  pain medicine that you take a stool softener to prevent constipation as most pain medicines will have constipation side effects. We recommend either Peri-Colace or Senokot and recommend that you also consider adding MiraLAX as well to prevent the constipation affects from pain medicine if you are required to use them. These medicines are over the counter and may be purchased at a local pharmacy. A cup of yogurt and a probiotic can also be helpful during the recovery process as the medicines can disrupt your intestinal environment.

## 2020-09-13 NOTE — Telephone Encounter (Signed)
noted 

## 2020-09-13 NOTE — Transfer of Care (Signed)
Immediate Anesthesia Transfer of Care Note  Patient: Rachel Walton  Procedure(s) Performed: Open reduction internal fixation left radial shaft fracture with hardware removal and repair as necessary (Left ) CARPAL TUNNEL RELEASE (Left Arm Lower)  Patient Location: PACU  Anesthesia Type:MAC and Regional  Level of Consciousness: awake, alert  and oriented  Airway & Oxygen Therapy: Patient Spontanous Breathing  Post-op Assessment: Report given to RN and Post -op Vital signs reviewed and stable  Post vital signs: Reviewed and stable  Last Vitals:  Vitals Value Taken Time  BP    Temp    Pulse 70 09/13/20 1823  Resp 14 09/13/20 1823  SpO2 100 % 09/13/20 1823  Vitals shown include unvalidated device data.  Last Pain:  Vitals:   09/13/20 1321  TempSrc:   PainSc: 0-No pain      Patients Stated Pain Goal: 2 (62/[id number]/38 [id number])  Complications: No complications documented.

## 2020-09-13 NOTE — Anesthesia Procedure Notes (Signed)
Anesthesia Regional Block: Supraclavicular block   Pre-Anesthetic Checklist: ,, timeout performed, Correct Patient, Correct Site, Correct Laterality, Correct Procedure, Correct Position, site marked, Risks and benefits discussed, pre-op evaluation,  At surgeon's request and post-op pain management  Laterality: Left  Prep: Maximum Sterile Barrier Precautions used, chloraprep       Needles:  Injection technique: Single-shot  Needle Type: Echogenic Stimulator Needle     Needle Length: 9cm  Needle Gauge: 22     Additional Needles:   Procedures:,,,, ultrasound used (permanent image in chart),,,,  Narrative:  Start time: 09/13/2020 4:30 PM End time: 09/13/2020 4:32 PM Injection made incrementally with aspirations every 5 mL.  Performed by: Personally  Anesthesiologist: Brennan Bailey, MD  Additional Notes: Risks, benefits, and alternative discussed. Patient gave consent for procedure. Patient prepped and draped in sterile fashion. Sedation administered, patient remains easily responsive to voice. Relevant anatomy identified with ultrasound guidance. Local anesthetic given in 5cc increments with no signs or symptoms of intravascular injection. No pain or paraesthesias with injection. Patient monitored throughout procedure with signs of LAST or immediate complications. Tolerated well. Ultrasound image placed in chart.  Tawny Asal, MD

## 2020-09-13 NOTE — H&P (Signed)
Rachel Walton is an 87 y.o. female.   Chief Complaint: Left distal third radius fracture in the face of prior plate fixation and carpal tunnel syndrome left upper extremity HPI: History of fall recently with resultant fracture about a prior plate placed for distal radius fracture.  The patient notes no instability about the fingers or elbow but does have a radial shaft fracture were planning surgical intervention and in conjunction with this a carpal tunnel release.  This will mandate hardware removal and replating with an extended plate about the left forearm. Patient presents for evaluation and treatment of the of their upper extremity predicament. The patient denies neck, back, chest or  abdominal pain. The patient notes that they have no lower extremity problems. The patients primary complaint is noted. We are planning surgical care pathway for the upper extremity. Past Medical History:  Diagnosis Date  . Acute gastric ulcer   . Allergy   . Anxiety   . Atrophic vaginitis   . Barrett's esophagus   . Basal cell carcinoma of nose     under nose  . Cataract   . Depression   . Diverticulosis   . Esophageal stricture   . Esophageal ulcer   . Fatty liver   . GERD (gastroesophageal reflux disease)   . Heart murmur   . Hiatal hernia   . Hx of adenomatous colonic polyps   . Hyperlipidemia   . Hypertension   . Hypokalemia   . IBS (irritable bowel syndrome)   . Kyphosis   . Lymphocytic colitis   . Macular degeneration    "?wet or dry; ? both eyes"  . 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 Osteopenia of the elderly   . Pneumonia X 1  . Postmenopausal   . Recurrent UTI   . 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 Tremor   . Vitamin D deficiency     Past Surgical History:  Procedure Laterality Date  . ABDOMINAL HYSTERECTOMY  1980  . APPENDECTOMY    . BACK SURGERY    . BREAST BIOPSY Left   . CATARACT EXTRACTION W/ INTRAOCULAR LENS  IMPLANT, BILATERAL Bilateral   .  COLONOSCOPY    . ESOPHAGOGASTRODUODENOSCOPY (EGD) WITH ESOPHAGEAL DILATION  X 1  . EYE SURGERY    . LUMBAR DISC SURGERY     "fragmented disc"  . POLYPECTOMY    . SQUAMOUS CELL CARCINOMA EXCISION     nose tip    Family History  Problem Relation Age of Onset  . Stroke Mother   . Heart disease Father        MI  . Heart attack Father   . Cancer Sister        Brain tumor  . Hypertension Sister   . Osteoporosis Sister   . Colon cancer Brother   . Heart disease Brother   . Diabetes Son 8  . Diabetes Maternal Uncle   . Diabetes Son 37  . Hypertension Sister   . Breast cancer Sister 68  . Hypertension Sister   . Cancer Sister        breast  . Osteoporosis Sister   . Hypertension Sister   . Hypertension Sister   . Stroke Sister   . Hypertension Sister   . Hypertension Sister   . Colon cancer Other        nephew  . Brain cancer Son        deceased  . Cancer Son  brain  . Thyroid cancer Other        niece  . Breast cancer Other   . Breast cancer Other   . Breast cancer Other   . Colon polyps Neg Hx   . Esophageal cancer Neg Hx   . Rectal cancer Neg Hx   . Stomach cancer Neg Hx    Social History:  reports that she has never smoked. She has never used smokeless tobacco. She reports that she does not drink alcohol and does not use drugs.  Allergies:  Allergies  Allergen Reactions  . Alendronate Sodium Other (See Comments)    Chest pain  . Lexapro [Escitalopram Oxalate] Other (See Comments)    Makes lymphangitic colitis worse  . Amoxicillin Other (See Comments)    diarrhea  . Aspirin Other (See Comments)    Per the patient, made the gastric ulcer worse.  . Clindamycin/Lincomycin Other (See Comments)    unknown  . Keflex [Cephalexin] Other (See Comments)    unknown  . Levofloxacin Other (See Comments)    Insomnia and headache  . Prednisone Other (See Comments)    Nervousness  . Sudafed [Pseudoephedrine Hcl] Other (See Comments)    insomnia  . Sulfa  Antibiotics Hives  . Toprol Xl [Metoprolol Succinate] Other (See Comments)    Unknown reaction  . Trimethoprim     Unknown reaction  . Zithromax [Azithromycin] Rash    Medications Prior to Admission  Medication Sig Dispense Refill  . acetaminophen (TYLENOL) 500 MG tablet Take 500 mg by mouth every 6 (six) hours as needed for mild pain.    . Cholecalciferol (VITAMIN D) 50 MCG (2000 UT) CAPS Take 2,000 Units by mouth daily.    . clonazePAM (KLONOPIN) 0.5 MG tablet TAKE 1/2 TO 1 TABLETS BY MOUTH 2 TIMES DAILY AS NEEDED FOR ANXIETY (Patient taking differently: Take 0.25-0.5 mg by mouth 2 (two) times daily as needed for anxiety (tremors).) 180 tablet 0  . dextromethorphan-guaiFENesin (MUCINEX DM) 30-600 MG 12hr tablet Take 1 tablet by mouth daily as needed for cough.    . fluticasone (FLONASE) 50 MCG/ACT nasal spray Place 2 sprays into both nostrils daily. (Patient taking differently: Place 2 sprays into both nostrils daily as needed for allergies or rhinitis.) 16 g 6  . HYDROcodone-acetaminophen (NORCO/VICODIN) 5-325 MG tablet Take 1 tablet by mouth every 6 (six) hours as needed for severe pain.    Marland Kitchen lisinopril (ZESTRIL) 20 MG tablet TAKE ONE (1) TABLET EACH DAY (Patient taking differently: Take 20 mg by mouth daily.) 90 tablet 1  . mesalamine (LIALDA) 1.2 g EC tablet Take 2 tablets (2.4 g total) by mouth daily. MUST HAVE OFFICE VISIT FOR FURTHER REFILLS (Patient taking differently: Take 1.2 g by mouth daily. MUST HAVE OFFICE VISIT FOR FURTHER REFILLS) 60 tablet 0  . metroNIDAZOLE (METROGEL) 1 % gel Apply topically daily. To affected areas of face 45 g 11  . OVER THE COUNTER MEDICATION Take 1 tablet by mouth daily. Stress B with Zinc    . potassium chloride (KLOR-CON) 10 MEQ tablet TAKE ONE (1) TABLET EACH DAY (Patient taking differently: Take 10 mEq by mouth daily.) 90 tablet 1  . vitamin B-12 (CYANOCOBALAMIN) 1000 MCG tablet Take 1,000 mcg by mouth daily.    Marland Kitchen doxycycline (VIBRAMYCIN) 100 MG  capsule Take 1 capsule (100 mg total) by mouth 2 (two) times daily. (Patient not taking: No sig reported) 20 capsule 0  . naphazoline-pheniramine (NAPHCON-A) 0.025-0.3 % ophthalmic solution Place 1 drop into both  eyes 4 (four) times daily as needed for eye irritation.      Results for orders placed or performed during the hospital encounter of 09/13/20 (from the past 48 hour(s))  SARS Coronavirus 2 by RT PCR (hospital order, performed in [facility] hospital lab) Nasopharyngeal Nasopharyngeal Swab     Status: None   Collection Time: 09/13/20 12:17 PM   Specimen: Nasopharyngeal Swab  Result Value Ref Range   SARS Coronavirus 2 NEGATIVE NEGATIVE    Comment: (NOTE) SARS-CoV-2 target nucleic acids are NOT DETECTED.  The SARS-CoV-2 RNA is generally detectable in upper and lower respiratory specimens during the acute phase of infection. The lowest concentration of SARS-CoV-2 viral copies this assay can detect is 250 copies / mL. A negative result does not preclude SARS-CoV-2 infection and should not be used as the sole basis for treatment or other patient management decisions.  A negative result may occur with improper specimen collection / handling, submission of specimen other than nasopharyngeal swab, presence of viral mutation(s) within the areas targeted by this assay, and inadequate number of viral copies (<250 copies / mL). A negative result must be combined with clinical observations, patient history, and epidemiological information.  Fact Sheet for Patients:   StrictlyIdeas.no  Fact Sheet for Healthcare Providers: BankingDealers.co.za  This test is not yet approved or  cleared by the [place] FDA and has been authorized for detection and/or diagnosis of SARS-CoV-2 by FDA under an Emergency Use Authorization (EUA).  This EUA will remain in effect (meaning this test can be used) for the duration of the COVID-19 declaration under  Section 564(b)(1) of the Act, 21 U.S.C. section 360bbb-3(b)(1), unless the authorization is terminated or revoked sooner.  Performed at [facility], [place] [address] [place], [place] [address]   CBC per protocol     Status: None   Collection Time: 09/13/20 12:44 PM  Result Value Ref Range   WBC 5.1 4.0 - 10.5 K/uL   RBC 4.59 3.87 - 5.11 MIL/uL   Hemoglobin 13.4 12.0 - 15.0 g/dL   HCT 41.8 36.0 - 46.0 %   MCV 91.1 80.0 - 100.0 fL   MCH 29.2 26.0 - 34.0 pg   MCHC 32.1 30.0 - 36.0 g/dL   RDW 12.8 11.5 - 15.5 %   Platelets 228 150 - 400 K/uL   nRBC 0.0 0.0 - 0.2 %    Comment: Performed at [facility], [place] [address] [place], [place] [address]  Comprehensive metabolic panel per protocol     Status: Abnormal   Collection Time: 09/13/20 12:44 PM  Result Value Ref Range   Sodium 140 135 - 145 mmol/L   Potassium 3.7 3.5 - 5.1 mmol/L   Chloride 104 98 - 111 mmol/L   CO2 29 22 - 32 mmol/L   Glucose, Bld 97 70 - 99 mg/dL    Comment: Glucose reference range applies only to samples taken after fasting for at least 8 hours.   BUN 5 (L) 8 - 23 mg/dL   Creatinine, Ser 0.82 0.44 - 1.00 mg/dL   Calcium 9.0 8.9 - 10.3 mg/dL   Total Protein 6.2 (L) 6.5 - 8.1 g/dL   Albumin 3.4 (L) 3.5 - 5.0 g/dL   AST 21 15 - 41 U/L   ALT 16 0 - 44 U/L   Alkaline Phosphatase 45 38 - 126 U/L   Total Bilirubin 0.9 0.3 - 1.2 mg/dL   GFR, Estimated >60 >60 mL/min    Comment: (  NOTE) Calculated using the CKD-EPI Creatinine Equation (2021)    Anion gap 7 5 - 15    Comment: Performed at [facility], [place] [address], [place], [place] [address]   DG MINI C-ARM IMAGE ONLY  Result Date: 09/13/2020 There is no interpretation for this exam.  This order is for images obtained during a surgical procedure.  Please See "Surgeries" Tab for more information regarding the procedure.    Review of Systems  Respiratory: Negative.   Cardiovascular: Negative.   Gastrointestinal: Negative.     Blood  pressure (!) 169/50, pulse 66, temperature 98.3 F (36.8 C), temperature source Oral, resp. rate 18, height 5\' 1"  (1.549 m), weight 47.2 kg, SpO2 99 %. Physical Exam  Intact sensation and motor function.  Intermittent carpal tunnel expression about the left upper extremity without signs of dystrophy compartment syndrome or infection.  She has radial shaft fracture in the face of prior plate and screw fixation.  Elbow is stable.  I reviewed x-rays and findings we will plan operative intervention.  The patient is alert and oriented in no acute distress. The patient complains of pain in the affected upper extremity.  The patient is noted to have a normal HEENT exam. Lung fields show equal chest expansion and no shortness of breath. Abdomen exam is nontender without distention. Lower extremity examination does not show any fracture dislocation or blood clot symptoms. Pelvis is stable and the neck and back are stable and nontender. Assessment/Plan We are planning surgery for your upper extremity. The risk and benefits of surgery to include risk of bleeding, infection, anesthesia,  damage to normal structures and failure of the surgery to accomplish its intended goals of relieving symptoms and restoring function have been discussed in detail. With this in mind we plan to proceed. I have specifically discussed with the patient the pre-and postoperative regime and the dos and don'ts and risk and benefits in great detail. Risk and benefits of surgery also include risk of dystrophy(CRPS), chronic nerve pain, failure of the healing process to go onto completion and other inherent risks of surgery The relavent the pathophysiology of the disease/injury process, as well as the alternatives for treatment and postoperative course of action has been discussed in great detail with the patient who desires to proceed.  We will do everything in our power to help you (the patient) restore function to the upper extremity.  It is a pleasure to see this patient today.   History of fall recently with resultant fracture about a prior plate placed for distal radius fracture.  The patient notes no instability about the fingers or elbow but does have a radial shaft fracture were planning surgical intervention and in conjunction with this a carpal tunnel release.  This will mandate hardware removal and replating with an extended plate about the left forearm.   Willa Frater III, MD 09/13/2020, 4:37 PM

## 2020-09-13 NOTE — Anesthesia Preprocedure Evaluation (Addendum)
Anesthesia Evaluation  Patient identified by MRN, date of birth, ID band Patient awake    Reviewed: Allergy & Precautions, NPO status , Patient's Chart, lab work & pertinent test results  History of Anesthesia Complications Negative for: history of anesthetic complications  Airway Mallampati: II  TM Distance: >3 FB Neck ROM: Full    Dental no notable dental hx.    Pulmonary neg pulmonary ROS,    Pulmonary exam normal        Cardiovascular hypertension, Pt. on medications Normal cardiovascular exam     Neuro/Psych Anxiety Depression negative neurological ROS     GI/Hepatic Neg liver ROS, hiatal hernia, PUD, GERD  Controlled,  Endo/Other  negative endocrine ROS  Renal/GU negative Renal ROS  negative genitourinary   Musculoskeletal Acute left radius shaft fracture and carpal tunnel syndrome   Abdominal   Peds  Hematology negative hematology ROS (+)   Anesthesia Other Findings Day of surgery medications reviewed with patient.  Reproductive/Obstetrics negative OB ROS                            Anesthesia Physical Anesthesia Plan  ASA: II  Anesthesia Plan: MAC and Regional   Post-op Pain Management:    Induction:   PONV Risk Score and Plan: 2 and Treatment may vary due to age or medical condition, Ondansetron and Dexamethasone  Airway Management Planned: Natural Airway and Simple Face Mask  Additional Equipment: None  Intra-op Plan:   Post-operative Plan:   Informed Consent: I have reviewed the patients History and Physical, chart, labs and discussed the procedure including the risks, benefits and alternatives for the proposed anesthesia with the patient or authorized representative who has indicated his/her understanding and acceptance.       Plan Discussed with: CRNA  Anesthesia Plan Comments:        Anesthesia Quick Evaluation

## 2020-09-15 NOTE — Anesthesia Postprocedure Evaluation (Signed)
Anesthesia Post Note  Patient: Rachel Walton  Procedure(s) Performed: HARDWARE REMOVAL, OPEN REDUCTION INTERNAL FIXATION LEFT RADIUS (Left Arm Lower) CARPAL TUNNEL RELEASE (Left Arm Lower)     Patient location during evaluation: PACU Anesthesia Type: Regional and MAC Level of consciousness: awake and alert Pain management: pain level controlled Vital Signs Assessment: post-procedure vital signs reviewed and stable Respiratory status: spontaneous breathing, nonlabored ventilation, respiratory function stable and patient connected to nasal cannula oxygen Cardiovascular status: stable and blood pressure returned to baseline Postop Assessment: no apparent nausea or vomiting Anesthetic complications: no   No complications documented.  Last Vitals:  Vitals:   09/13/20 1845 09/13/20 1900  BP: (!) 159/54 (!) 148/52  Pulse: 61 60  Resp: 15 14  Temp:  (!) 36.4 C  SpO2: 98% 97%    Last Pain:  Vitals:   09/13/20 1900  TempSrc:   PainSc: 0-No pain                 Brandice Busser

## 2020-09-17 ENCOUNTER — Encounter (HOSPITAL_COMMUNITY): Payer: Self-pay | Admitting: Orthopedic Surgery

## 2020-09-26 DIAGNOSIS — S52322D Displaced transverse fracture of shaft of left radius, subsequent encounter for closed fracture with routine healing: Secondary | ICD-10-CM | POA: Diagnosis not present

## 2020-09-26 DIAGNOSIS — Z4789 Encounter for other orthopedic aftercare: Secondary | ICD-10-CM | POA: Diagnosis not present

## 2020-09-26 DIAGNOSIS — M25532 Pain in left wrist: Secondary | ICD-10-CM | POA: Diagnosis not present

## 2020-09-26 DIAGNOSIS — S52502D Unspecified fracture of the lower end of left radius, subsequent encounter for closed fracture with routine healing: Secondary | ICD-10-CM | POA: Diagnosis not present

## 2020-10-10 ENCOUNTER — Other Ambulatory Visit: Payer: Self-pay | Admitting: Internal Medicine

## 2020-10-14 ENCOUNTER — Telehealth: Payer: Self-pay | Admitting: Internal Medicine

## 2020-10-14 MED ORDER — MESALAMINE 1.2 G PO TBEC: 1.2000 g | DELAYED_RELEASE_TABLET | Freq: Every day | ORAL | 1 refills | Status: DC

## 2020-10-14 NOTE — Telephone Encounter (Signed)
Refills have been to the patient's pharmacy. Patient must keep appointment.

## 2020-10-14 NOTE — Telephone Encounter (Signed)
Inbound call from patient need medication refill Mesalamine 1.2gm to [facility] in [place]. Have patient have set up an appointment 7/15 with Alonza Bogus.

## 2020-10-17 DIAGNOSIS — Z4789 Encounter for other orthopedic aftercare: Secondary | ICD-10-CM | POA: Diagnosis not present

## 2020-10-17 DIAGNOSIS — S52502D Unspecified fracture of the lower end of left radius, subsequent encounter for closed fracture with routine healing: Secondary | ICD-10-CM | POA: Diagnosis not present

## 2020-10-17 DIAGNOSIS — S52322D Displaced transverse fracture of shaft of left radius, subsequent encounter for closed fracture with routine healing: Secondary | ICD-10-CM | POA: Diagnosis not present

## 2020-10-17 DIAGNOSIS — M25532 Pain in left wrist: Secondary | ICD-10-CM | POA: Diagnosis not present

## 2020-10-22 DIAGNOSIS — M25632 Stiffness of left wrist, not elsewhere classified: Secondary | ICD-10-CM | POA: Diagnosis not present

## 2020-10-29 DIAGNOSIS — M25632 Stiffness of left wrist, not elsewhere classified: Secondary | ICD-10-CM | POA: Diagnosis not present

## 2020-11-14 ENCOUNTER — Other Ambulatory Visit: Payer: Self-pay | Admitting: Internal Medicine

## 2020-11-14 DIAGNOSIS — S52502D Unspecified fracture of the lower end of left radius, subsequent encounter for closed fracture with routine healing: Secondary | ICD-10-CM | POA: Diagnosis not present

## 2020-11-14 DIAGNOSIS — Z4789 Encounter for other orthopedic aftercare: Secondary | ICD-10-CM | POA: Diagnosis not present

## 2020-11-14 DIAGNOSIS — S52322D Displaced transverse fracture of shaft of left radius, subsequent encounter for closed fracture with routine healing: Secondary | ICD-10-CM | POA: Diagnosis not present

## 2020-11-14 DIAGNOSIS — M25532 Pain in left wrist: Secondary | ICD-10-CM | POA: Diagnosis not present

## 2020-11-22 ENCOUNTER — Ambulatory Visit: Payer: Medicare PPO | Admitting: Gastroenterology

## 2020-12-02 ENCOUNTER — Encounter: Payer: Self-pay | Admitting: Nurse Practitioner

## 2020-12-02 ENCOUNTER — Other Ambulatory Visit: Payer: Self-pay

## 2020-12-02 ENCOUNTER — Ambulatory Visit: Payer: Medicare PPO | Admitting: Nurse Practitioner

## 2020-12-02 VITALS — BP 140/70 | HR 74 | Temp 97.5°F | Resp 20 | Ht 61.0 in | Wt 108.0 lb

## 2020-12-02 DIAGNOSIS — W57XXXA Bitten or stung by nonvenomous insect and other nonvenomous arthropods, initial encounter: Secondary | ICD-10-CM

## 2020-12-02 DIAGNOSIS — S70262A Insect bite (nonvenomous), left hip, initial encounter: Secondary | ICD-10-CM

## 2020-12-02 DIAGNOSIS — S30867A Insect bite (nonvenomous) of anus, initial encounter: Secondary | ICD-10-CM

## 2020-12-02 MED ORDER — DOXYCYCLINE HYCLATE 100 MG PO TABS: 100.0000 mg | ORAL_TABLET | Freq: Two times a day (BID) | ORAL | 0 refills | Status: DC

## 2020-12-02 NOTE — Patient Instructions (Signed)
Tick Bite Information, Adult  Ticks are insects that can bite. Most ticks live in shrubs and grassy areas. They climb onto people and animals that go by. Then they bite. Some ticks carrygerms that can make you sick. How can I prevent tick bites? Take these steps: Use insect repellent Use an insect repellent that has 20% or higher of the ingredients DEET, picaridin, or IR3535. Follow the instructions on the label. Put it on: Bare skin. The tops of your boots. Your pant legs. The ends of your sleeves. If you use an insect repellent that has the ingredient permethrin, follow the instructions on the label. Put it on: Clothing. Boots. Supplies or outdoor gear. Tents. When you are outside Wear long sleeves and long pants. Wear light-colored clothes. Tuck your pant legs into your socks. Stay in the middle of the trail. Do not touch the bushes. Avoid walking through long grass. Check for ticks on your clothes, hair, and skin often while you are outside. Before going inside your house, check your clothes, skin, head, neck, armpits, waist, groin, and joint areas. When you go indoors Check your clothes for ticks. Dry your clothes in a dryer on high heat for 10 minutes or more. If clothes are damp, additional time may be needed. Wash your clothes right away if they need to be washed. Use hot water. Check your pets and outdoor gear. Shower right away. Check your body for ticks. Do a full body check using a mirror. What is the right way to remove a tick? Remove the tick from your skin as soon as possible. Do not remove the tick with your bare fingers. To remove a tick that is crawling on your skin: Go outdoors and brush the tick off. Use tape or a lint roller. To remove a tick that is biting: Wash your hands. If you have latex gloves, put them on. Use tweezers, curved forceps, or a tick-removal tool to grasp the tick. Grasp the tick as close to your skin and as close to the tick's head as  possible. Gently pull up until the tick lets go. Try to keep the tick's head attached to its body. Do not twist or jerk the tick. Do not squeeze or crush the tick. Do not try to remove a tick with heat, alcohol, petroleum jelly, or fingernail polish. What should I do after taking out a tick? Throw away the tick. Do not crush a tick with your fingers. Clean the bite area and your hands with soap and water, rubbing alcohol, or an iodine wash. If an antiseptic cream or ointment is available, apply a small amount to the bite area. Wash and disinfect any instruments that you used to remove the tick. How should I get rid of a live tick? To dispose of a live tick, use one of these methods: Place the tick in rubbing alcohol. Place the tick in a bag or container you can close tightly. Wrap the tick tightly in tape. Flush the tick down the toilet. Contact a doctor if: You have symptoms, such as: A fever or chills. A red rash that makes a circle (bull's-eye rash) in the bite area. Redness and swelling where the tick bit you. Headache. Pain in a muscle, joint, or bone. Being more tired than normal. Trouble walking or moving your legs. Numbness in your legs. Tender and swollen lymph glands. A part of a tick breaks off and gets stuck in your skin. Get help right away if: You cannot remove a   tick. You cannot move (have paralysis) or feel weak. You are feeling worse or have new symptoms. You find a tick that is biting you and filled with blood. This is important if you are in an area where diseases from ticks are common. Summary Ticks may carry germs that can make you sick. To prevent tick bites wear long sleeves, long pants, and light colors. Use insect repellent. Follow the instructions on the label. If the tick is biting, do not try to remove it with heat, alcohol, petroleum jelly, or fingernail polish. Use tweezers, curved forceps, or a tick-removal tool to grasp the tick. Gently pull up  until the tick lets go. Do not twist or jerk the tick. Do not squeeze or crush the tick. If you have symptoms, contact a doctor. This information is not intended to replace advice given to you by your health care provider. Make sure you discuss any questions you have with your healthcare provider. Document Revised: 04/24/2019 Document Reviewed: 04/24/2019 Elsevier Patient Education  2022 Elsevier Inc.  

## 2020-12-02 NOTE — Progress Notes (Signed)
   Subjective:    Patient ID: Rachel Walton, female    DOB: [birth year], 87 y.o.   MRN: [id number]   Chief Complaint: Tick Removal   HPI Patient comes in stating that she removed a tick from left hip. Not sure how long it was there. Left a red place where it was removed.    Review of Systems  Constitutional: Negative.  Negative for fever.  HENT: Negative.    Respiratory: Negative.    Genitourinary: Negative.   Musculoskeletal:  Negative for arthralgias and neck pain.  Neurological: Negative.  Negative for dizziness and headaches.  Psychiatric/Behavioral: Negative.    All other systems reviewed and are negative.     Objective:   Physical Exam Vitals reviewed.  Constitutional:      Appearance: Normal appearance.  Cardiovascular:     Rate and Rhythm: Normal rate and regular rhythm.     Heart sounds: Normal heart sounds.  Pulmonary:     Effort: Pulmonary effort is normal.     Breath sounds: Normal breath sounds.  Skin:    General: Skin is warm.     Comments: Tick bite with 3cm annular erythema distally  Neurological:     General: No focal deficit present.     Mental Status: She is alert and oriented to person, place, and time.     Cranial Nerves: No cranial nerve deficit.     Sensory: No sensory deficit.  Psychiatric:        Mood and Affect: Mood normal.        Behavior: Behavior normal.   BP 140/70   Pulse 74   Temp (!) 97.5 F (36.4 C) (Temporal)   Resp 20   Ht '5\' 1"'$  (1.549 m)   Wt 108 lb (49 kg)   SpO2 97%   BMI 20.41 kg/m         [name] in today with chief complaint of Tick Removal   1. Tick bite of anus, initial encounter Clean area with antibacterial soap Avoid picking or scrathcing Meds ordered this encounter  Medications   doxycycline (VIBRA-TABS) 100 MG tablet    Sig: Take 1 tablet (100 mg total) by mouth 2 (two) times daily. 1 po bid    Dispense:  20 tablet    Refill:  0    Order Specific Question:   Supervising Provider     Answer:   Caryl Pina A [id number]     The above assessment and management plan was discussed with the patient. The patient verbalized understanding of and has agreed to the management plan. Patient is aware to call the clinic if symptoms persist or worsen. Patient is aware when to return to the clinic for a follow-up visit. Patient educated on when it is appropriate to go to the emergency department.   Mary-Margaret Hassell Done, FNP

## 2020-12-04 ENCOUNTER — Other Ambulatory Visit: Payer: Self-pay | Admitting: Family Medicine

## 2020-12-04 ENCOUNTER — Encounter: Payer: Self-pay | Admitting: Family Medicine

## 2020-12-04 ENCOUNTER — Ambulatory Visit
Admission: RE | Admit: 2020-12-04 | Discharge: 2020-12-04 | Disposition: A | Discharge status: Home / Self Care | Payer: Medicare PPO | Source: Ambulatory Visit | Attending: Family Medicine | Admitting: Family Medicine

## 2020-12-04 ENCOUNTER — Other Ambulatory Visit: Payer: Self-pay

## 2020-12-04 ENCOUNTER — Ambulatory Visit: Payer: Medicare PPO | Admitting: Family Medicine

## 2020-12-04 VITALS — BP 152/65 | HR 64 | Temp 96.4°F | Resp 20 | Ht 61.0 in | Wt 108.0 lb

## 2020-12-04 DIAGNOSIS — M8580 Other specified disorders of bone density and structure, unspecified site: Secondary | ICD-10-CM | POA: Diagnosis not present

## 2020-12-04 DIAGNOSIS — J302 Other seasonal allergic rhinitis: Secondary | ICD-10-CM

## 2020-12-04 DIAGNOSIS — E782 Mixed hyperlipidemia: Secondary | ICD-10-CM

## 2020-12-04 DIAGNOSIS — I10 Essential (primary) hypertension: Secondary | ICD-10-CM

## 2020-12-04 DIAGNOSIS — Z23 Encounter for immunization: Secondary | ICD-10-CM

## 2020-12-04 DIAGNOSIS — E785 Hyperlipidemia, unspecified: Secondary | ICD-10-CM | POA: Diagnosis not present

## 2020-12-04 DIAGNOSIS — E876 Hypokalemia: Secondary | ICD-10-CM

## 2020-12-04 DIAGNOSIS — Z0000 Encounter for general adult medical examination without abnormal findings: Secondary | ICD-10-CM

## 2020-12-04 DIAGNOSIS — Z1231 Encounter for screening mammogram for malignant neoplasm of breast: Secondary | ICD-10-CM

## 2020-12-04 DIAGNOSIS — E559 Vitamin D deficiency, unspecified: Secondary | ICD-10-CM | POA: Diagnosis not present

## 2020-12-04 NOTE — Patient Instructions (Addendum)
Please take over the counter Calcium citrate 1,200 once daily to protect your bones.   Please check BP at home and keep a log to bring back with you to your next appointment.

## 2020-12-04 NOTE — Progress Notes (Signed)
Assessment & Plan:  1. Essential hypertension Elevated today. Patient to monitor BP at home, keep a log, and bring it back with her to her next appointment.  - CBC with Differential/Platelet - CMP14+EGFR - Lipid panel - Vitamin B12  2. Hypokalemia Taking a supplement. Labs to assess. - CMP14+EGFR  3. Mixed hyperlipidemia Labs to assess. No medications to treat. - Lipid panel  4. Seasonal allergies Well controlled on current regimen.   5. Osteopenia with high risk of fracture Encouraged to add Calcium 1,200 mg once daily. Continue vitamin D supplement. - VITAMIN D 25 Hydroxy (Vit-D Deficiency, Fractures)  6. Vitamin D deficiency Labs to assess. - VITAMIN D 25 Hydroxy (Vit-D Deficiency, Fractures)  7. Need for shingles vaccine - Varicella-zoster vaccine IM (Shingrix) - given in office.  8. Healthcare maintenance Patient to obtain mammogram on the bus that comes to our office; she will get this scheduled. She is agreeable to repeating DEXA; we will do this at her next visit.    Return in about 6 weeks (around 01/15/2021) for HTN.  Hendricks Limes, MSN, APRN, FNP-C [facility]  Subjective:    Patient ID: Rachel Walton, female    DOB: May 20, 1929, 87 y.o.   MRN: [id number]  Patient Care Team: Loman Brooklyn, FNP as PCP - General (Family Medicine) Alphonsa Overall, MD as Consulting Physician (General Surgery) Irine Seal, MD as Attending Physician (Urology) Jerrell Belfast, MD as Consulting Physician (Otolaryngology) Justice Britain, MD as Consulting Physician (Orthopedic Surgery) Harriett Sine, MD as Consulting Physician (Dermatology) Pyrtle, Lajuan Lines, MD as Consulting Physician (Gastroenterology) Sanda Klein, MD as Consulting Physician (Cardiology) Marica Otter, OD (Optometry)   Chief Complaint:  Chief Complaint  Patient presents with   Medical Management of Chronic Issues   Establish Care    HPI: Rachel Walton is a 87 y.o. female  presenting on 12/04/2020 for Medical Management of Chronic Issues and Establish Care  Hypertension: elevated today, but patient feels this is good for her. She occasionally checks her BP at home, but is unable to tell me her readings. She takes her Lisinopril daily. She does report mild swelling of her lower extremities as the day progresses.   Hypokalemia: patient takes a potassium supplement daily.   Allergies: using Flonase daily.   Osteopenia: taking vitamin D daily, but not calcium. She has broken her left arm twice recently. Her last DEXA scan was completed on 06/22/2016. She has had multiple falls recently, but each time she had tripped over something and it was pure accident.   Depression screen [facility] 2/9 12/04/2020 12/04/2020 09/06/2020  Decreased Interest 0 0 0  Down, Depressed, Hopeless 1 1 0  PHQ - 2 Score 1 1 0  Altered sleeping 1 - -  Tired, decreased energy 0 - -  Change in appetite 1 - -  Feeling bad or failure about yourself  0 - -  Trouble concentrating 0 - -  Moving slowly or fidgety/restless 0 - -  Suicidal thoughts 0 - -  PHQ-9 Score 3 - -  Difficult doing work/chores Somewhat difficult - -  Some recent data might be hidden   GAD 7 : Generalized Anxiety Score 12/04/2020  Nervous, Anxious, on Edge 0  Control/stop worrying 1  Worry too much - different things 0  Trouble relaxing 0  Restless 0  Easily annoyed or irritable 0  Afraid - awful might happen 0  Total GAD 7 Score 1  Anxiety Difficulty Somewhat difficult  Social history:  Relevant past medical, surgical, family and social history reviewed and updated as indicated. Interim medical history since our last visit reviewed.  Allergies and medications reviewed and updated.  DATA REVIEWED: CHART IN EPIC  ROS: Negative unless specifically indicated above in HPI.    Current Outpatient Medications:    acetaminophen (TYLENOL) 500 MG tablet, Take 500 mg by mouth every 6 (six) hours as needed for mild pain.,  Disp: , Rfl:    Cholecalciferol (VITAMIN D) 50 MCG (2000 UT) CAPS, Take 2,000 Units by mouth daily., Disp: , Rfl:    fluticasone (FLONASE) 50 MCG/ACT nasal spray, Place 2 sprays into both nostrils daily. (Patient taking differently: Place 2 sprays into both nostrils daily as needed for allergies or rhinitis.), Disp: 16 g, Rfl: 6   lisinopril (ZESTRIL) 20 MG tablet, TAKE ONE (1) TABLET EACH DAY (Patient taking differently: Take 20 mg by mouth daily.), Disp: 90 tablet, Rfl: 1   mesalamine (LIALDA) 1.2 g EC tablet, Take 1 tablet (1.2 g total) by mouth daily. PLEASE KEEP YOUR APPT FOR FURTHER REFILLS, Disp: 30 tablet, Rfl: 1   metroNIDAZOLE (METROGEL) 1 % gel, Apply topically daily. To affected areas of face, Disp: 45 g, Rfl: 11   OVER THE COUNTER MEDICATION, Take 1 tablet by mouth daily. Stress B with Zinc, Disp: , Rfl:    potassium chloride (KLOR-CON) 10 MEQ tablet, TAKE ONE (1) TABLET EACH DAY (Patient taking differently: Take 10 mEq by mouth daily.), Disp: 90 tablet, Rfl: 1   vitamin B-12 (CYANOCOBALAMIN) 1000 MCG tablet, Take 1,000 mcg by mouth daily., Disp: , Rfl:    clonazePAM (KLONOPIN) 0.5 MG tablet, TAKE 1/2 TO 1 TABLETS BY MOUTH 2 TIMES DAILY AS NEEDED FOR ANXIETY (Patient taking differently: Take 0.25-0.5 mg by mouth 2 (two) times daily as needed for anxiety (tremors).), Disp: 180 tablet, Rfl: 0   doxycycline (VIBRA-TABS) 100 MG tablet, Take 1 tablet (100 mg total) by mouth 2 (two) times daily. 1 po bid (Patient not taking: Reported on 12/04/2020), Disp: 20 tablet, Rfl: 0   naphazoline-pheniramine (NAPHCON-A) 0.025-0.3 % ophthalmic solution, Place 1 drop into both eyes 4 (four) times daily as needed for eye irritation. (Patient not taking: Reported on 12/04/2020), Disp: , Rfl:    Allergies  Allergen Reactions   Alendronate Sodium Other (See Comments)    Chest pain   Lexapro [Escitalopram Oxalate] Other (See Comments)    Makes lymphangitic colitis worse   Amoxicillin Other (See Comments)     diarrhea   Aspirin Other (See Comments)    Per the patient, made the gastric ulcer worse.   Clindamycin/Lincomycin Other (See Comments)    unknown   Keflex [Cephalexin] Other (See Comments)    unknown   Levofloxacin Other (See Comments)    Insomnia and headache   Prednisone Other (See Comments)    Nervousness   Sudafed [Pseudoephedrine Hcl] Other (See Comments)    insomnia   Sulfa Antibiotics Hives   Toprol Xl [Metoprolol Succinate] Other (See Comments)    Unknown reaction   Trimethoprim     Unknown reaction   Zithromax [Azithromycin] Rash   Past Medical History:  Diagnosis Date   Acute gastric ulcer    Allergy    Anxiety    Atrophic vaginitis    Barrett's esophagus    Basal cell carcinoma of nose     under nose   Cataract    Depression    Diverticulosis    Esophageal stricture    Esophageal  ulcer    Fatty liver    GERD (gastroesophageal reflux disease)    Heart murmur    Hiatal hernia    Hx of adenomatous colonic polyps    Hyperlipidemia    Hypertension    Hypokalemia    IBS (irritable bowel syndrome)    Kyphosis    Lymphocytic colitis    Macular degeneration    "?wet or dry; ? 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    Tremor    Vitamin D deficiency     Past Surgical History:  Procedure Laterality Date   ABDOMINAL HYSTERECTOMY  1980   APPENDECTOMY     BACK SURGERY     BREAST BIOPSY Left    CARPAL TUNNEL RELEASE Left 09/13/2020   Procedure: CARPAL TUNNEL RELEASE;  Surgeon: Roseanne Kaufman, MD;  Location: [place];  Service: Orthopedics;  Laterality: Left;   CATARACT EXTRACTION W/ INTRAOCULAR LENS  IMPLANT, BILATERAL Bilateral    COLONOSCOPY     ESOPHAGOGASTRODUODENOSCOPY (EGD) WITH ESOPHAGEAL DILATION  X 1   EYE SURGERY     LUMBAR DISC SURGERY     "fragmented disc"   ORIF RADIAL FRACTURE Left 09/13/2020    Procedure: HARDWARE REMOVAL, OPEN REDUCTION INTERNAL FIXATION LEFT RADIUS;  Surgeon: Roseanne Kaufman, MD;  Location: [place];  Service: Orthopedics;  Laterality: Left;  Block with IV sedation   POLYPECTOMY     SQUAMOUS CELL CARCINOMA EXCISION     nose tip    Social History   Socioeconomic History   Marital status: Married    Spouse name: Not on file   Number of children: 3   Years of education: Not on file   Highest education level: Not on file  Occupational History   Occupation: Retired  Tobacco Use   Smoking status: Never   Smokeless tobacco: Never  Vaping Use   Vaping Use: Never used  Substance and Sexual Activity   Alcohol use: No    Alcohol/week: 0.0 standard drinks   Drug use: No   Sexual activity: Not Currently  Other Topics Concern   Not on file  Social History Narrative   Not on file   Social Determinants of Health   Financial Resource Strain: Not on file  Food Insecurity: Not on file  Transportation Needs: Not on file  Physical Activity: Not on file  Stress: Not on file  Social Connections: Not on file  Intimate Partner Violence: Not on file        Objective:    BP (!) 152/65   Pulse 64   Temp (!) 96.4 F (35.8 C)   Resp 20   Ht 5' 1"  (1.549 m)   Wt 108 lb (49 kg)   SpO2 100%   BMI 20.41 kg/m   Wt Readings from Last 3 Encounters:  12/04/20 108 lb (49 kg)  12/02/20 108 lb (49 kg)  09/13/20 104 lb (47.2 kg)    Physical Exam Vitals reviewed.  Constitutional:      General: She is not in acute distress.    Appearance: Normal appearance. She is normal weight. She is not ill-appearing, toxic-appearing or diaphoretic.  HENT:     Head: Normocephalic and atraumatic.  Eyes:     General: No scleral icterus.       Right eye: No discharge.  Left eye: No discharge.     Conjunctiva/sclera: Conjunctivae normal.  Cardiovascular:     Rate and Rhythm: Normal rate.  Pulmonary:     Effort: Pulmonary effort is normal. No respiratory distress.      Breath sounds: Normal breath sounds.  Musculoskeletal:        General: Normal range of motion.     Cervical back: Normal range of motion.     Comments: Wearing left arm splint.  Skin:    General: Skin is warm and dry.     Capillary Refill: Capillary refill takes less than 2 seconds.  Neurological:     General: No focal deficit present.     Mental Status: She is alert and oriented to person, place, and time. Mental status is at baseline.  Psychiatric:        Mood and Affect: Mood normal.        Behavior: Behavior normal.        Thought Content: Thought content normal.        Judgment: Judgment normal.    Lab Results  Component Value Date   TSH 1.78 03/05/2020   Lab Results  Component Value Date   WBC 5.1 09/13/2020   HGB 13.4 09/13/2020   HCT 41.8 09/13/2020   MCV 91.1 09/13/2020   PLT 228 09/13/2020   Lab Results  Component Value Date   NA 140 09/13/2020   K 3.7 09/13/2020   CO2 29 09/13/2020   GLUCOSE 97 09/13/2020   BUN 5 (L) 09/13/2020   CREATININE 0.82 09/13/2020   BILITOT 0.9 09/13/2020   ALKPHOS 45 09/13/2020   AST 21 09/13/2020   ALT 16 09/13/2020   PROT 6.2 (L) 09/13/2020   ALBUMIN 3.4 (L) 09/13/2020   CALCIUM 9.0 09/13/2020   ANIONGAP 7 09/13/2020   GFR 67.79 05/04/2019   Lab Results  Component Value Date   CHOL 296 (H) 03/05/2020   Lab Results  Component Value Date   HDL 80 03/05/2020   Lab Results  Component Value Date   LDLCALC 179 (H) 03/05/2020   Lab Results  Component Value Date   TRIG 210 (H) 03/05/2020   Lab Results  Component Value Date   CHOLHDL 3.7 03/05/2020   Lab Results  Component Value Date   HGBA1C 5.6 05/04/2019

## 2020-12-05 LAB — LIPID PANEL
Chol/HDL Ratio: 2.8 ratio (ref 0.0–4.4)
Cholesterol, Total: 279 mg/dL — ABNORMAL HIGH (ref 100–199)
HDL: 98 mg/dL (ref >39)
LDL Chol Calc (NIH): 151 mg/dL — ABNORMAL HIGH (ref 0–99)
Triglycerides: 172 mg/dL — ABNORMAL HIGH (ref 0–149)
VLDL Cholesterol Cal: 30 mg/dL (ref 5–40)

## 2020-12-05 LAB — CMP14+EGFR
ALT: 11 IU/L (ref 0–32)
AST: 19 IU/L (ref 0–40)
Albumin/Globulin Ratio: 1.8 (ref 1.2–2.2)
Albumin: 4.2 g/dL (ref 3.6–4.6)
Alkaline Phosphatase: 61 IU/L (ref 44–121)
BUN/Creatinine Ratio: 11 — ABNORMAL LOW (ref 12–28)
BUN: 8 mg/dL (ref 8–27)
Bilirubin Total: 0.3 mg/dL (ref 0.0–1.2)
CO2: 25 mmol/L (ref 20–29)
Calcium: 9.5 mg/dL (ref 8.7–10.3)
Chloride: 100 mmol/L (ref 96–106)
Creatinine, Ser: 0.71 mg/dL (ref 0.57–1.00)
Globulin, Total: 2.3 g/dL (ref 1.5–4.5)
Glucose: 93 mg/dL (ref 65–99)
Potassium: 4.5 mmol/L (ref 3.5–5.2)
Sodium: 140 mmol/L (ref 134–144)
Total Protein: 6.5 g/dL (ref 6.0–8.5)
eGFR: 82 mL/min/{1.73_m2} (ref >59)

## 2020-12-05 LAB — CBC WITH DIFFERENTIAL/PLATELET
Basophils Absolute: 0.1 10*3/uL (ref 0.0–0.2)
Basos: 1 %
EOS (ABSOLUTE): 0.1 10*3/uL (ref 0.0–0.4)
Eos: 2 %
Hematocrit: 38.8 % (ref 34.0–46.6)
Hemoglobin: 12.7 g/dL (ref 11.1–15.9)
Immature Grans (Abs): 0 10*3/uL (ref 0.0–0.1)
Immature Granulocytes: 0 %
Lymphocytes Absolute: 2.4 10*3/uL (ref 0.7–3.1)
Lymphs: 40 %
MCH: 29.4 pg (ref 26.6–33.0)
MCHC: 32.7 g/dL (ref 31.5–35.7)
MCV: 90 fL (ref 79–97)
Monocytes Absolute: 0.4 10*3/uL (ref 0.1–0.9)
Monocytes: 7 %
Neutrophils Absolute: 3 10*3/uL (ref 1.4–7.0)
Neutrophils: 50 %
Platelets: 267 10*3/uL (ref 150–450)
RBC: 4.32 x10E6/uL (ref 3.77–5.28)
RDW: 12.5 % (ref 11.7–15.4)
WBC: 5.9 10*3/uL (ref 3.4–10.8)

## 2020-12-05 LAB — VITAMIN B12: Vitamin B-12: 1243 pg/mL (ref 232–1245)

## 2020-12-05 LAB — VITAMIN D 25 HYDROXY (VIT D DEFICIENCY, FRACTURES): Vit D, 25-Hydroxy: 43.2 ng/mL (ref 30.0–100.0)

## 2020-12-07 ENCOUNTER — Encounter: Payer: Self-pay | Admitting: Family Medicine

## 2020-12-10 ENCOUNTER — Other Ambulatory Visit: Payer: Self-pay | Admitting: Family Medicine

## 2020-12-10 ENCOUNTER — Other Ambulatory Visit: Payer: Self-pay | Admitting: Internal Medicine

## 2020-12-10 DIAGNOSIS — E876 Hypokalemia: Secondary | ICD-10-CM

## 2020-12-13 ENCOUNTER — Telehealth: Payer: Self-pay | Admitting: Family Medicine

## 2020-12-17 ENCOUNTER — Other Ambulatory Visit: Payer: Self-pay | Admitting: Family Medicine

## 2020-12-17 ENCOUNTER — Other Ambulatory Visit: Payer: Self-pay | Admitting: Internal Medicine

## 2020-12-17 DIAGNOSIS — I10 Essential (primary) hypertension: Secondary | ICD-10-CM

## 2020-12-24 DIAGNOSIS — Z79899 Other long term (current) drug therapy: Secondary | ICD-10-CM | POA: Diagnosis not present

## 2020-12-24 DIAGNOSIS — Z9181 History of falling: Secondary | ICD-10-CM | POA: Diagnosis not present

## 2020-12-24 DIAGNOSIS — I10 Essential (primary) hypertension: Secondary | ICD-10-CM | POA: Diagnosis not present

## 2020-12-24 DIAGNOSIS — J309 Allergic rhinitis, unspecified: Secondary | ICD-10-CM | POA: Diagnosis not present

## 2020-12-31 ENCOUNTER — Other Ambulatory Visit: Payer: Medicare PPO

## 2021-01-06 DIAGNOSIS — S52322D Displaced transverse fracture of shaft of left radius, subsequent encounter for closed fracture with routine healing: Secondary | ICD-10-CM | POA: Diagnosis not present

## 2021-01-06 DIAGNOSIS — Z4789 Encounter for other orthopedic aftercare: Secondary | ICD-10-CM | POA: Diagnosis not present

## 2021-01-06 DIAGNOSIS — S52502D Unspecified fracture of the lower end of left radius, subsequent encounter for closed fracture with routine healing: Secondary | ICD-10-CM | POA: Diagnosis not present

## 2021-01-15 ENCOUNTER — Encounter: Payer: Self-pay | Admitting: Family Medicine

## 2021-01-15 ENCOUNTER — Ambulatory Visit: Payer: Medicare PPO | Admitting: Family Medicine

## 2021-01-15 ENCOUNTER — Other Ambulatory Visit (INDEPENDENT_AMBULATORY_CARE_PROVIDER_SITE_OTHER): Payer: Medicare PPO

## 2021-01-15 ENCOUNTER — Other Ambulatory Visit: Payer: Self-pay

## 2021-01-15 VITALS — BP 166/66 | HR 66 | Temp 97.8°F | Ht 61.0 in | Wt 110.8 lb

## 2021-01-15 DIAGNOSIS — M8580 Other specified disorders of bone density and structure, unspecified site: Secondary | ICD-10-CM | POA: Diagnosis not present

## 2021-01-15 DIAGNOSIS — J302 Other seasonal allergic rhinitis: Secondary | ICD-10-CM

## 2021-01-15 DIAGNOSIS — I10 Essential (primary) hypertension: Secondary | ICD-10-CM

## 2021-01-15 DIAGNOSIS — R413 Other amnesia: Secondary | ICD-10-CM | POA: Diagnosis not present

## 2021-01-15 DIAGNOSIS — Z780 Asymptomatic menopausal state: Secondary | ICD-10-CM

## 2021-01-15 DIAGNOSIS — F419 Anxiety disorder, unspecified: Secondary | ICD-10-CM

## 2021-01-15 DIAGNOSIS — H6593 Unspecified nonsuppurative otitis media, bilateral: Secondary | ICD-10-CM

## 2021-01-15 DIAGNOSIS — J3089 Other allergic rhinitis: Secondary | ICD-10-CM

## 2021-01-15 MED ORDER — FLUTICASONE PROPIONATE 50 MCG/ACT NA SUSP: 2.0000 | Freq: Every day | NASAL | 6 refills | Status: DC

## 2021-01-15 MED ORDER — BUSPIRONE HCL 7.5 MG PO TABS: 7.5000 mg | ORAL_TABLET | Freq: Two times a day (BID) | ORAL | 2 refills | Status: DC | PRN

## 2021-01-15 NOTE — Progress Notes (Signed)
Assessment & Plan:  1. Essential hypertension - Well controlled on current regimen - continue lisinopril 20 mg as prescribed  2. Seasonal allergies - continue flonase - add an antihistamine daily  3. Memory difficulties - may use Prevagen Professional Formula  4. Osteopenia with high risk of fracture - repeat DEXA today - continue calcium and vitamin D supplements  5. Anxiety - added buspirone prn for anxiety - busPIRone (BUSPAR) 7.5 MG tablet; Take 1 tablet (7.5 mg total) by mouth 2 (two) times daily as needed.  Dispense: 30 tablet; Refill: 2   Return in about 6 months (around 07/15/2021) for annual physical.  Lucile Crater, NP Student  I personally was present during the history, physical exam, and medical decision-making activities of this service and have verified that the service and findings are accurately documented in the nurse practitioner student's note.  Hendricks Limes, MSN, APRN, FNP-C [facility]   Subjective:    Patient ID: Rachel Walton, female    DOB: 05-Apr-1930, 87 y.o.   MRN: [id number]  Patient Care Team: Loman Brooklyn, FNP as PCP - General (Family Medicine) Alphonsa Overall, MD as Consulting Physician (General Surgery) Irine Seal, MD as Attending Physician (Urology) Jerrell Belfast, MD as Consulting Physician (Otolaryngology) Justice Britain, MD as Consulting Physician (Orthopedic Surgery) Harriett Sine, MD as Consulting Physician (Dermatology) Pyrtle, Lajuan Lines, MD as Consulting Physician (Gastroenterology) Croitoru, Dani Gobble, MD as Consulting Physician (Cardiology) Marica Otter, [place] (Optometry)   Chief Complaint:  Chief Complaint  Patient presents with   Hypertension    6 week follow up     HPI: Rachel Walton is a 87 y.o. female presenting on 01/15/2021 for Hypertension (6 week follow up )  Hypertension: elevated today, but she has been taking her blood pressures at home and they have been 116-156/65-79, with only 2  readings >140.   Osteopenia: She has been taking a calcium supplement since her last visit as directed. She was already taking a vitamin d supplement. She got her DEXA today.  Allergies: using Flonase daily. She states her ears feel full all of the time. She says she occasionally has headaches. She does not take a daily antihistamine.    New Complaints: She is interested in Prevagen to prevent memory deficits, as she finds she is sometimes forgetful.   Patient feels she needs something to take as needed for anxiety since she is no longer taking clonazepam.   Depression screen [facility] 2/9 01/15/2021 12/04/2020 12/04/2020  Decreased Interest 1 0 0  Down, Depressed, Hopeless _0 PHQ - 2 Score _1 Altered sleeping 1 1 -  Tired, decreased energy 2 0 -  Change in appetite 0 1 -  Feeling bad or failure about yourself  1 0 -  Trouble concentrating 0 0 -  Moving slowly or fidgety/restless 0 0 -  Suicidal thoughts 0 0 -  PHQ-9 Score 6 3 -  Difficult doing work/chores Somewhat difficult Somewhat difficult -  Some recent data might be hidden   GAD 7 : Generalized Anxiety Score 01/15/2021 12/04/2020  Nervous, Anxious, on Edge 1 0  Control/stop worrying 1 1  Worry too much - different things 0 0  Trouble relaxing 1 0  Restless 0 0  Easily annoyed or irritable 1 0  Afraid - awful might happen 0 0  Total GAD 7 Score 4 1  Anxiety Difficulty Somewhat difficult Somewhat difficult    Social history:  Relevant past medical, surgical,  family and social history reviewed and updated as indicated. Interim medical history since our last visit reviewed.  Allergies and medications reviewed and updated.  DATA REVIEWED: CHART IN EPIC  ROS: Negative unless specifically indicated above in HPI.    Current Outpatient Medications:    acetaminophen (TYLENOL) 500 MG tablet, Take 500 mg by mouth every 6 (six) hours as needed for mild pain., Disp: , Rfl:    busPIRone (BUSPAR) 7.5 MG tablet, Take 1 tablet  (7.5 mg total) by mouth 2 (two) times daily as needed., Disp: 30 tablet, Rfl: 2   CALCIUM PO, Take by mouth daily., Disp: , Rfl:    Cholecalciferol (VITAMIN D) 50 MCG (2000 UT) CAPS, Take 2,000 Units by mouth daily., Disp: , Rfl:    lisinopril (ZESTRIL) 20 MG tablet, Take 1 tablet (20 mg total) by mouth daily., Disp: 90 tablet, Rfl: 1   mesalamine (LIALDA) 1.2 g EC tablet, TAKE ONE (1) TABLET BY MOUTH EVERY DAY, Disp: 30 tablet, Rfl: 1   OVER THE COUNTER MEDICATION, Take 1 tablet by mouth daily. Stress B with Zinc, Disp: , Rfl:    potassium chloride (KLOR-CON) 10 MEQ tablet, TAKE ONE (1) TABLET EACH DAY, Disp: 90 tablet, Rfl: 1   vitamin B-12 (CYANOCOBALAMIN) 1000 MCG tablet, Take 1,000 mcg by mouth daily., Disp: , Rfl:    fluticasone (FLONASE) 50 MCG/ACT nasal spray, Place 2 sprays into both nostrils daily., Disp: 16 g, Rfl: 6   Allergies  Allergen Reactions   Alendronate Sodium Other (See Comments)    Chest pain   Lexapro [Escitalopram Oxalate] Other (See Comments)    Makes lymphangitic colitis worse   Amoxicillin Other (See Comments)    diarrhea   Aspirin Other (See Comments)    Per the patient, made the gastric ulcer worse.   Clindamycin/Lincomycin Other (See Comments)    unknown   Keflex [Cephalexin] Other (See Comments)    unknown   Levofloxacin Other (See Comments)    Insomnia and headache   Prednisone Other (See Comments)    Nervousness   Sudafed [Pseudoephedrine Hcl] Other (See Comments)    insomnia   Sulfa Antibiotics Hives   Toprol Xl [Metoprolol Succinate] Other (See Comments)    Unknown reaction   Trimethoprim     Unknown reaction   Zithromax [Azithromycin] Rash   Past Medical History:  Diagnosis Date   Acute gastric ulcer    Allergy    Anxiety    Atrophic vaginitis    Barrett's esophagus    Basal cell carcinoma of nose     under nose   Cataract    Depression    Diverticulosis    Esophageal stricture    Esophageal ulcer    Fatty liver    Gall stones  06/15/2013   GERD (gastroesophageal reflux disease)    Heart murmur    Hiatal hernia    Hx of adenomatous colonic polyps    Hyperlipidemia    Hypertension    Hypokalemia    IBS (irritable bowel syndrome)    Kyphosis    Lymphocytic colitis    Macular degeneration    "?wet or dry; ? 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    Tremor    Vitamin D deficiency     Past Surgical History:  Procedure Laterality   Date   ABDOMINAL HYSTERECTOMY  1980   APPENDECTOMY     BACK SURGERY     BREAST BIOPSY Left    CARPAL TUNNEL RELEASE Left 09/13/2020   Procedure: CARPAL TUNNEL RELEASE;  Surgeon: Gramig, William, MD;  Location: [facility];  Service: Orthopedics;  Laterality: Left;   CATARACT EXTRACTION W/ INTRAOCULAR LENS  IMPLANT, BILATERAL Bilateral    COLONOSCOPY     ESOPHAGOGASTRODUODENOSCOPY (EGD) WITH ESOPHAGEAL DILATION  X 1   EYE SURGERY     LUMBAR DISC SURGERY     "fragmented disc"   ORIF RADIAL FRACTURE Left 09/13/2020   Procedure: HARDWARE REMOVAL, OPEN REDUCTION INTERNAL FIXATION LEFT RADIUS;  Surgeon: Gramig, William, MD;  Location: [facility];  Service: Orthopedics;  Laterality: Left;  Block with IV sedation   POLYPECTOMY     SQUAMOUS CELL CARCINOMA EXCISION     nose tip    Social History   Socioeconomic History   Marital status: Married    Spouse name: Not on file   Number of children: 3   Years of education: Not on file   Highest education level: Not on file  Occupational History   Occupation: Retired  Tobacco Use   Smoking status: Never   Smokeless tobacco: Never  Vaping Use   Vaping Use: Never used  Substance and Sexual Activity   Alcohol use: No    Alcohol/week: 0.0 standard drinks   Drug use: No   Sexual activity: Not Currently  Other Topics Concern   Not on file  Social History Narrative   Not on file   Social Determinants of  Health   Financial Resource Strain: Not on file  Food Insecurity: Not on file  Transportation Needs: Not on file  Physical Activity: Not on file  Stress: Not on file  Social Connections: Not on file  Intimate Partner Violence: Not on file        Objective:    BP (!) 166/66   Pulse 66   Temp 97.8 F (36.6 C) (Temporal)   Ht 5' 1" (1.549 m)   Wt 50.3 kg   SpO2 99%   BMI 20.94 kg/m   Wt Readings from Last 3 Encounters:  01/15/21 110 lb 12.8 oz (50.3 kg)  12/04/20 108 lb (49 kg)  12/02/20 108 lb (49 kg)    Physical Exam Vitals reviewed.  Constitutional:      General: She is not in acute distress.    Appearance: Normal appearance. She is normal weight. She is not ill-appearing, toxic-appearing or diaphoretic.  HENT:     Head: Normocephalic and atraumatic.  Eyes:     General: No scleral icterus.       Right eye: No discharge.        Left eye: No discharge.     Conjunctiva/sclera: Conjunctivae normal.  Cardiovascular:     Rate and Rhythm: Normal rate.  Pulmonary:     Effort: Pulmonary effort is normal. No respiratory distress.     Breath sounds: Normal breath sounds.  Musculoskeletal:        General: Normal range of motion.     Cervical back: Normal range of motion.     Comments: Wearing left arm splint.  Skin:    General: Skin is warm and dry.     Capillary Refill: Capillary refill takes less than 2 seconds.  Neurological:     General: No focal deficit present.     Mental Status: She is alert and oriented to person, place, and time.   Mental status is at baseline.  Psychiatric:        Mood and Affect: Mood normal.        Behavior: Behavior normal.        Thought Content: Thought content normal.        Judgment: Judgment normal.    Lab Results  Component Value Date   TSH 1.78 03/05/2020   Lab Results  Component Value Date   WBC 5.9 12/04/2020   HGB 12.7 12/04/2020   HCT 38.8 12/04/2020   MCV 90 12/04/2020   PLT 267 12/04/2020   Lab Results   Component Value Date   NA 140 12/04/2020   K 4.5 12/04/2020   CO2 25 12/04/2020   GLUCOSE 93 12/04/2020   BUN 8 12/04/2020   CREATININE 0.71 12/04/2020   BILITOT 0.3 12/04/2020   ALKPHOS 61 12/04/2020   AST 19 12/04/2020   ALT 11 12/04/2020   PROT 6.5 12/04/2020   ALBUMIN 4.2 12/04/2020   CALCIUM 9.5 12/04/2020   ANIONGAP 7 09/13/2020   EGFR 82 12/04/2020   GFR 67.79 05/04/2019   Lab Results  Component Value Date   CHOL 279 (H) 12/04/2020   Lab Results  Component Value Date   HDL 98 12/04/2020   Lab Results  Component Value Date   LDLCALC 151 (H) 12/04/2020   Lab Results  Component Value Date   TRIG 172 (H) 12/04/2020   Lab Results  Component Value Date   CHOLHDL 2.8 12/04/2020   Lab Results  Component Value Date   HGBA1C 5.6 05/04/2019

## 2021-01-15 NOTE — Patient Instructions (Addendum)
Reminder: please return after mid-September for your flu shot. If you receive this elsewhere, such as your pharmacy, please let us know so we can get this documented in your chart. We have flu clinic days scheduled during the month of October, if you would like to go ahead and schedule for this.   Add an antihistamine such as Claritin, Zyrtec, Allegra, or [place]

## 2021-01-17 DIAGNOSIS — M85851 Other specified disorders of bone density and structure, right thigh: Secondary | ICD-10-CM | POA: Diagnosis not present

## 2021-01-17 DIAGNOSIS — Z780 Asymptomatic menopausal state: Secondary | ICD-10-CM | POA: Diagnosis not present

## 2021-01-18 DIAGNOSIS — F419 Anxiety disorder, unspecified: Secondary | ICD-10-CM | POA: Insufficient documentation

## 2021-01-18 DIAGNOSIS — R413 Other amnesia: Secondary | ICD-10-CM | POA: Insufficient documentation

## 2021-01-22 ENCOUNTER — Other Ambulatory Visit: Payer: Self-pay | Admitting: Internal Medicine

## 2021-02-17 ENCOUNTER — Encounter: Payer: Self-pay | Admitting: Internal Medicine

## 2021-02-17 ENCOUNTER — Ambulatory Visit: Payer: Medicare PPO | Admitting: Internal Medicine

## 2021-02-17 VITALS — BP 130/60 | HR 82 | Ht 61.0 in | Wt 109.4 lb

## 2021-02-17 DIAGNOSIS — R079 Chest pain, unspecified: Secondary | ICD-10-CM | POA: Diagnosis not present

## 2021-02-17 DIAGNOSIS — K52839 Microscopic colitis, unspecified: Secondary | ICD-10-CM

## 2021-02-17 DIAGNOSIS — Z8719 Personal history of other diseases of the digestive system: Secondary | ICD-10-CM

## 2021-02-17 MED ORDER — MESALAMINE 1.2 G PO TBEC: 2.4000 g | DELAYED_RELEASE_TABLET | Freq: Every day | ORAL | 2 refills | Status: DC

## 2021-02-17 NOTE — Progress Notes (Signed)
Subjective:    Patient ID: Rachel Walton, female    DOB: [birth year], 87 y.o.   MRN: [id number]  HPI Rachel Walton is an 87 year old female with a history of lymphocytic colitis, longstanding IBS, colonic diverticulosis, remote colon polyps, history of GERD with esophagitis and short segment Barrett's esophagus, history of PUD who is here for follow-up.  Last seen in October 2020.  Here alone today.  Of late she reports she has had issues with chest pain.  This is pain across her chest.  May be worse with eating.  Can be associated with shortness of breath.  Feels weak.  This is a tightness and not a burning like her traditional heartburn.  No dysphagia.  Some regurgitation symptoms.  Been occurring over the last 1 to 2 months.  Overall diarrhea has improved.  She is off of budesonide.  She was taking Lialda 4 tablets daily and then wean down to 2 and now down to 1 tablet/day.  Stools can be loose.  No bleeding.  Rare hemorrhoidal irritation.  She has had 2 mechanical falls fracturing her left forearm twice.  She has been switched off clonazepam to buspirone.  She does not like the way buspirone makes her feel and hopes to go back to clonazepam which she is taken for many many years on the direction of Dr. [name].  It  helped with her anxiousness.   Review of Systems As per HPI, otherwise negative  Current Medications, Allergies, Past Medical History, Past Surgical History, Family History and Social History were reviewed in [employer] record.    Objective:   Physical Exam BP 130/60   Pulse 82   Ht 5\' 1"  (1.549 m)   Wt 109 lb 6 oz (49.6 kg)   BMI 20.67 kg/m  Gen: awake, alert, NAD HEENT: anicteric, op clear CV: RRR, no mrg Pulm: CTA b/l Abd: soft, NT/ND, +BS throughout Ext: no c/c/e Neuro: nonfocal  CBC    Component Value Date/Time   WBC 5.9 12/04/2020 1429   WBC 5.1 09/13/2020 1244   RBC 4.32 12/04/2020 1429   RBC 4.59 09/13/2020 1244   HGB 12.7  12/04/2020 1429   HCT 38.8 12/04/2020 1429   PLT 267 12/04/2020 1429   MCV 90 12/04/2020 1429   MCH 29.4 12/04/2020 1429   MCH 29.2 09/13/2020 1244   MCHC 32.7 12/04/2020 1429   MCHC 32.1 09/13/2020 1244   RDW 12.5 12/04/2020 1429   LYMPHSABS 2.4 12/04/2020 1429   MONOABS 0.5 09/29/2019 1519   EOSABS 0.1 12/04/2020 1429   BASOSABS 0.1 12/04/2020 1429   CMP     Component Value Date/Time   NA 140 12/04/2020 1429   K 4.5 12/04/2020 1429   CL 100 12/04/2020 1429   CO2 25 12/04/2020 1429   GLUCOSE 93 12/04/2020 1429   GLUCOSE 97 09/13/2020 1244   BUN 8 12/04/2020 1429   CREATININE 0.71 12/04/2020 1429   CREATININE 0.82 03/05/2020 1201   CALCIUM 9.5 12/04/2020 1429   PROT 6.5 12/04/2020 1429   ALBUMIN 4.2 12/04/2020 1429   AST 19 12/04/2020 1429   ALT 11 12/04/2020 1429   ALKPHOS 61 12/04/2020 1429   BILITOT 0.3 12/04/2020 1429   GFRNONAA >60 09/13/2020 1244   GFRNONAA 61 11/21/2012 1632   GFRAA >60 09/29/2019 1519   GFRAA 70 11/21/2012 1632        Assessment & Plan:  87 year old female with a history of lymphocytic colitis, longstanding IBS, colonic diverticulosis, remote colon  polyps, history of GERD with esophagitis and short segment Barrett's esophagus, history of PUD who is here for follow-up.   Chest pain/history of GERD/history of Barrett's esophagus--could be reflux but given her associated dyspnea and age I recommended cardiac evaluation --Cardiology referral --If no cardiac etiology found given her history of GERD, esophagitis and Barrett's esophagus I would recommend we consider upper endoscopy --I did not start PPI today given her history of microscopic colitis  2.  Diarrhea/lymphocytic colitis --she had previously been treated with budesonide and mesalamine.  She is having intermittent loose stools and I think she should increase her Lialda to 2.4 g daily which she previously tolerated very well. --Increase Lialda to 2.4 g daily -- Remain off budesonide  3.   Anxiety --longstanding clonazepam change recently to buspirone.  She apparently is not tolerating this.  I encouraged her to discuss this with her family nurse practitioner.  30 minutes total spent today including patient facing time, coordination of care, reviewing medical history/procedures/pertinent radiology studies, and documentation of the encounter.

## 2021-02-17 NOTE — Patient Instructions (Addendum)
We have sent the following medications to your pharmacy for you to pick up at your convenience: Lialda 2.4 grams daily (increase from previous dosing)  You have been referred to [facility] for evaluation of your chest pain. Your appointment is on 03/02/21 at 4:00 pm. Please arrive at 3:30 pm for registration and paperwork purposes. Your appointment will take place at [address], [place], [place] [address]  If you are age 40 or older, your body mass index should be between 23-30. Your Body mass index is 20.67 kg/m. If this is out of the aforementioned range listed, please consider follow up with your Primary Care Provider.  If you are age 50 or younger, your body mass index should be between 19-25. Your Body mass index is 20.67 kg/m. If this is out of the aformentioned range listed, please consider follow up with your Primary Care Provider.   ________________________________________________________  The [facility] providers would like to encourage you to use [facility] to communicate with providers for non-urgent requests or questions.  Due to long hold times on the telephone, sending your provider a message by [facility] may be a faster and more efficient way to get a response.  Please allow 48 business hours for a response.  Please remember that this is for non-urgent requests.   Due to recent changes in healthcare laws, you may see the results of your imaging and laboratory studies on MyChart before your provider has had a chance to review them.  We understand that in some cases there may be results that are confusing or concerning to you. Not all laboratory results come back in the same time frame and the provider may be waiting for multiple results in order to interpret others.  Please give [place] 48 hours in order for your provider to thoroughly review all the results before contacting the office for clarification of your results.

## 2021-03-07 ENCOUNTER — Ambulatory Visit: Payer: Medicare PPO | Admitting: Cardiovascular Disease

## 2021-03-07 ENCOUNTER — Encounter: Payer: Self-pay | Admitting: Cardiovascular Disease

## 2021-03-07 ENCOUNTER — Other Ambulatory Visit: Payer: Self-pay

## 2021-03-07 VITALS — BP 180/63 | HR 69 | Ht 61.0 in | Wt 112.6 lb

## 2021-03-07 DIAGNOSIS — R072 Precordial pain: Secondary | ICD-10-CM | POA: Diagnosis not present

## 2021-03-07 NOTE — Progress Notes (Signed)
Cardiology Office Note:   Date:  03/07/2021  NAME:  Rachel Walton    MRN: [id number] DOB:  October 16, 1929   PCP:  Loman Brooklyn, FNP  Cardiologist:  None  Electrophysiologist:  None   Referring MD: Jerene Bears, MD   Chief Complaint  Patient presents with   Chest Pain    History of Present Illness:   Rachel Walton is a 87 y.o. female with a hx of GERD, HTN, HLD who is being seen today for the evaluation of chest pain at the request of Pyrtle, Lajuan Lines, MD. she reports for the last 4 to 6 weeks has had episodes of chest tightness after eating.  She reports a history of esophageal stricture in 2015.  Also had a ulcer at the GE junction.  This was documented by GI, Dr. Maurene Capes.  She reports no other symptoms.  The tightness in her chest can occur mainly with eating.  She reports no exertional component to it.  It is not alleviated by rest.  Her medical history is significant for hypertension and esophageal stricture as mentioned.  Most recent lipid profile shows a total cholesterol 279, HDL 98, LDL 151, triglycerides 172.  She has never had a heart attack or stroke.  She does not smoke.  No alcohol or drug use is reported.  She used to work in [place].  Her EKG in office shows normal sinus rhythm with no acute ischemic changes or evidence of infarction.  She underwent cardiac testing in 2017 that showed a normal echo and normal stress test.  Gastroenterology referred her to [place] to make sure her heart is healthy enough for EGD.  To me symptoms are likely just GI related.  Cardiovascular examination is normal.  She reports no shortness of breath.  She reports several falls recently.  She is undergone surgery for that.  I suspect she is healthy enough for EGD with likely esophageal dilation.  I did review her CT abdomen pelvis which did capture her heart in 2019.  She has no evidence of coronary calcium.  Problem List GERD/Barrett's  Lymphocytic colitis  IBS HTN HLD  Past Medical History: Past  Medical History:  Diagnosis Date   Acute gastric ulcer    Allergy    Anxiety    Atrophic vaginitis    Barrett's esophagus    Basal cell carcinoma of nose     under nose   Cataract    Depression    Diverticulosis    Esophageal stricture    Esophageal ulcer    Fatty liver    Gall stones 06/15/2013   GERD (gastroesophageal reflux disease)    Heart murmur    Hiatal hernia    Hx of adenomatous colonic polyps    Hyperlipidemia    Hypertension    Hypokalemia    IBS (irritable bowel syndrome)    Kyphosis    Lymphocytic colitis    Macular degeneration    "?wet or dry; ? 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    Tremor    Vitamin D deficiency     Past Surgical History: Past Surgical History:  Procedure Laterality Date   ABDOMINAL HYSTERECTOMY  1980   APPENDECTOMY     BACK SURGERY     BREAST BIOPSY Left  CARPAL TUNNEL RELEASE Left 09/13/2020   Procedure: CARPAL TUNNEL RELEASE;  Surgeon: Roseanne Kaufman, MD;  Location: [facility];  Service: Orthopedics;  Laterality: Left;   CATARACT EXTRACTION W/ INTRAOCULAR LENS  IMPLANT, BILATERAL Bilateral    COLONOSCOPY     ESOPHAGOGASTRODUODENOSCOPY (EGD) WITH ESOPHAGEAL DILATION  X 1   EYE SURGERY     LUMBAR DISC SURGERY     "fragmented disc"   ORIF RADIAL FRACTURE Left 09/13/2020   Procedure: HARDWARE REMOVAL, OPEN REDUCTION INTERNAL FIXATION LEFT RADIUS;  Surgeon: Roseanne Kaufman, MD;  Location: [facility];  Service: Orthopedics;  Laterality: Left;  Block with IV sedation   POLYPECTOMY     SQUAMOUS CELL CARCINOMA EXCISION     nose tip    Current Medications: Current Meds  Medication Sig   acetaminophen (TYLENOL) 500 MG tablet Take 500 mg by mouth every 6 (six) hours as needed for mild pain.   CALCIUM PO Take by mouth daily.   Cholecalciferol (VITAMIN D) 50 MCG (2000 UT) CAPS Take 2,000 Units  by mouth daily.   fluticasone (FLONASE) 50 MCG/ACT nasal spray Place 2 sprays into both nostrils daily.   lisinopril (ZESTRIL) 20 MG tablet Take 1 tablet (20 mg total) by mouth daily.   mesalamine (LIALDA) 1.2 g EC tablet Take 2 tablets (2.4 g total) by mouth daily with breakfast.   OVER THE COUNTER MEDICATION Take 1 tablet by mouth daily. Stress B with Zinc   potassium chloride (KLOR-CON) 10 MEQ tablet TAKE ONE (1) TABLET EACH DAY   vitamin B-12 (CYANOCOBALAMIN) 1000 MCG tablet Take 1,000 mcg by mouth daily.   [DISCONTINUED] clonazePAM (KLONOPIN) 0.5 MG tablet Take 0.5 mg by mouth 2 (two) times daily as needed for anxiety.     Allergies:    Alendronate sodium, Lexapro [escitalopram oxalate], Amoxicillin, Aspirin, Clindamycin/lincomycin, Keflex [cephalexin], Levofloxacin, Prednisone, Sudafed [pseudoephedrine hcl], Sulfa antibiotics, Toprol xl [metoprolol succinate], Trimethoprim, and Zithromax [azithromycin]   Social History: Social History   Socioeconomic History   Marital status: Married    Spouse name: Not on file   Number of children: 3   Years of education: Not on file   Highest education level: Not on file  Occupational History   Occupation: Retired  Tobacco Use   Smoking status: Never   Smokeless tobacco: Never  Vaping Use   Vaping Use: Never used  Substance and Sexual Activity   Alcohol use: No    Alcohol/week: 0.0 standard drinks   Drug use: No   Sexual activity: Not Currently  Other Topics Concern   Not on file  Social History Narrative   Not on file   Social Determinants of Health   Financial Resource Strain: Not on file  Food Insecurity: Not on file  Transportation Needs: Not on file  Physical Activity: Not on file  Stress: Not on file  Social Connections: Not on file     Family History: The patient's family history includes Brain cancer in her son; Breast cancer in some other family members; Breast cancer (age of onset: 20) in her sister; Cancer in her  sister and son; Colon cancer in her brother and another family member; Diabetes in her maternal uncle; Diabetes (age of onset: 84) in her son; Diabetes (age of onset: 32) in her son; Heart attack in her father; Heart disease in her brother and father; Hypertension in her sister, sister, sister, sister, sister, sister, and sister; Osteoporosis in her sister and sister; Stroke in her mother and sister; Thyroid cancer in an  other family member. There is no history of Colon polyps, Esophageal cancer, Rectal cancer, or Stomach cancer.  ROS:   All other ROS reviewed and negative. Pertinent positives noted in the HPI.     EKGs/Labs/Other Studies Reviewed:   The following studies were personally reviewed by me today:  EKG:  EKG is ordered today.  The ekg ordered today demonstrates normal sinus rhythm heart rate 69, no acute ischemic changes or evidence of infarction, and was personally reviewed by me.   NM Stress 2017 1. No reversible ischemia or infarction.   2. Normal left ventricular wall motion.   3. Left ventricular ejection fraction is 73%.   4. Non invasive risk stratification*: Low  Recent Labs: 12/04/2020: ALT 11; BUN 8; Creatinine, Ser 0.71; Hemoglobin 12.7; Platelets 267; Potassium 4.5; Sodium 140   Recent Lipid Panel    Component Value Date/Time   CHOL 279 (H) 12/04/2020 1429   TRIG 172 (H) 12/04/2020 1429   TRIG 101 06/05/2015 1115   HDL 98 12/04/2020 1429   HDL 101 06/05/2015 1115   CHOLHDL 2.8 12/04/2020 1429   CHOLHDL 3.7 03/05/2020 1201   VLDL 34.0 05/04/2019 0947   LDLCALC 151 (H) 12/04/2020 1429   LDLCALC 179 (H) 03/05/2020 1201   LDLCALC 147 (H) 12/11/2013 0908    Physical Exam:   VS:  BP (!) 180/63   Pulse 69   Ht 5\' 1"  (1.549 m)   Wt 112 lb 9.6 oz (51.1 kg)   SpO2 98%   BMI 21.28 kg/m    Wt Readings from Last 3 Encounters:  03/07/21 112 lb 9.6 oz (51.1 kg)  02/17/21 109 lb 6 oz (49.6 kg)  01/15/21 110 lb 12.8 oz (50.3 kg)    General: Well nourished, well  developed, in no acute distress Head: Atraumatic, normal size  Eyes: PEERLA, EOMI  Neck: Supple, no JVD Endocrine: No thryomegaly Cardiac: Normal S1, S2; RRR; no murmurs, rubs, or gallops Lungs: Clear to auscultation bilaterally, no wheezing, rhonchi or rales  Abd: Soft, nontender, no hepatomegaly  Ext: No edema, pulses 2+ Musculoskeletal: No deformities, BUE and BLE strength normal and equal Skin: Warm and dry, no rashes   Neuro: Alert and oriented to person, place, time, and situation, CNII-XII grossly intact, no focal deficits  Psych: Normal mood and affect   ASSESSMENT:   Rachel Walton is a 87 y.o. female who presents for the following: 1. Precordial pain     PLAN:   1. Precordial pain -She presents for evaluation of chest pain.  Describes tightness after eating food.  She has a known history of esophageal stricture in 2015.  Also had gastritis noted on that study.  She reports no exertional chest pain or pressure.   -Her EKG in office is normal sinus rhythm with no acute ischemic changes or evidence of infarction. CVD exam normal.  I did review her CT abdomen pelvis from 2019.  This did capture heart.  She has no evidence of coronary calcium on that scan. -Her symptoms are not worsened by exertion or alleviated by rest.  To me symptoms are likely just related to probably esophageal pathology.  She underwent an echocardiogram in 2017 as well as stress test that were normal.  CT abdomen pelvis shows no evidence of coronary calcium as the heart was captured on the study.  -She really has no CVD risk factors other than her age.  The fact that she has no coronary calcium is highly reassuring.   -There is  no strong family history of heart disease.  She can walk around her house without major limitations.  She has no exertional chest pain symptoms.  I believe this is all GI related.  I see no need for further evaluation of this pain.  She has undergone surgery with orthopedics earlier this  year.  She did well with surgery.  I suspect she will do well with an EGD.  I would recommend no further cardiac testing prior to her EGD.  She will see [place] back as needed.  We will communicate these findings to her gastroenterologist.  Disposition: Return if symptoms worsen or fail to improve.  Medication Adjustments/Labs and Tests Ordered: Current medicines are reviewed at length with the patient today.  Concerns regarding medicines are outlined above.  Orders Placed This Encounter  Procedures   EKG 12-Lead    No orders of the defined types were placed in this encounter.   Patient Instructions  Medication Instructions:  The current medical regimen is effective;  continue present plan and medications.  *If you need a refill on your cardiac medications before your next appointment, please call your pharmacy*   Follow-Up: At [facility], you and your health needs are our priority.  As part of our continuing mission to provide you with exceptional heart care, we have created designated Provider Care Teams.  These Care Teams include your primary Cardiologist (physician) and Advanced Practice Providers (APPs -  Physician Assistants and Nurse Practitioners) who all work together to provide you with the care you need, when you need it.  We recommend signing up for the patient portal called "MyChart".  Sign up information is provided on this After Visit Summary.  MyChart is used to connect with patients for Virtual Visits (Telemedicine).  Patients are able to view lab/test results, encounter notes, upcoming appointments, etc.  Non-urgent messages can be sent to your provider as well.   To learn more about what you can do with MyChart, go to [email or web address].    Your next appointment:   As needed  The format for your next appointment:   In Person  Provider:   Eleonore Chiquito, MD      Signed, Addison Naegeli. Audie Box, MD, [facility]  [address] [place], [place] [address] [phone]  03/07/2021 4:46 PM

## 2021-03-07 NOTE — Patient Instructions (Signed)
Medication Instructions:  The current medical regimen is effective;  continue present plan and medications.  *If you need a refill on your cardiac medications before your next appointment, please call your pharmacy*    Follow-Up: At [facility], you and your health needs are our priority.  As part of our continuing mission to provide you with exceptional heart care, we have created designated Provider Care Teams.  These Care Teams include your primary Cardiologist (physician) and Advanced Practice Providers (APPs -  Physician Assistants and Nurse Practitioners) who all work together to provide you with the care you need, when you need it.  We recommend signing up for the patient portal called "MyChart".  Sign up information is provided on this After Visit Summary.  MyChart is used to connect with patients for Virtual Visits (Telemedicine).  Patients are able to view lab/test results, encounter notes, upcoming appointments, etc.  Non-urgent messages can be sent to your provider as well.   To learn more about what you can do with MyChart, go to [email or web address].    Your next appointment:   As needed  The format for your next appointment:   In Person  Provider:   Tangerine O'Neal, MD      

## 2021-03-11 NOTE — Progress Notes (Signed)
I reviewed cards visit If her symptoms persist and she is willing we can proceed with EGD in [place]

## 2021-03-12 ENCOUNTER — Telehealth: Payer: Self-pay | Admitting: *Deleted

## 2021-03-12 DIAGNOSIS — R079 Chest pain, unspecified: Secondary | ICD-10-CM

## 2021-03-12 DIAGNOSIS — Z8711 Personal history of peptic ulcer disease: Secondary | ICD-10-CM

## 2021-03-12 DIAGNOSIS — R531 Weakness: Secondary | ICD-10-CM

## 2021-03-12 DIAGNOSIS — Z8719 Personal history of other diseases of the digestive system: Secondary | ICD-10-CM

## 2021-03-12 NOTE — Telephone Encounter (Signed)
I have spoken to patient to advise of Dr Vena Rua offer to go forward with endoscopy. She notes that she continues to have tightness in her chest, especially with eating. She would like to forward with endoscopy. She has been scheduled for 03/21/21 at 9:00 am with Dr Hilarie Fredrickson in [facility]. I have given her detailed instructions regarding prep for this procedure.  Patient also notes several times that she is "so weak." She says that sometimes, she feels that she can hardly walk at times. I have spoken to Dr Hilarie Fredrickson about this and he would like her to come for a CBC and CMP just to exclude any obvious causes of weakness.   I have left a message for patient to call back about this.

## 2021-03-12 NOTE — Telephone Encounter (Signed)
Routed Note  Author: Jerene Bears, MD Service: Gastroenterology Author Type: Physician  Filed: 03/11/2021 11:24 AM Encounter Date: 03/07/2021 Status: Signed  Editor: Jerene Bears, MD (Physician)        I reviewed cards visit If her symptoms persist and she is willing we can proceed with EGD in [place]

## 2021-03-13 ENCOUNTER — Telehealth: Payer: Self-pay | Admitting: *Deleted

## 2021-03-13 NOTE — Telephone Encounter (Signed)
I have spoken to patient to advise she come for labs either Friday, next Monday or Tuesday. She indicates she will come on Tuesday to have this completed.

## 2021-03-15 ENCOUNTER — Other Ambulatory Visit: Payer: Self-pay | Admitting: Family Medicine

## 2021-03-15 DIAGNOSIS — E876 Hypokalemia: Secondary | ICD-10-CM

## 2021-03-17 ENCOUNTER — Ambulatory Visit (INDEPENDENT_AMBULATORY_CARE_PROVIDER_SITE_OTHER): Payer: Medicare PPO

## 2021-03-17 VITALS — Ht 61.0 in | Wt 112.0 lb

## 2021-03-17 DIAGNOSIS — Z0000 Encounter for general adult medical examination without abnormal findings: Secondary | ICD-10-CM | POA: Diagnosis not present

## 2021-03-17 NOTE — Patient Instructions (Signed)
Ms. Jentsch , Thank you for taking time to come for your Medicare Wellness Visit. I appreciate your ongoing commitment to your health goals. Please review the following plan we discussed and let me know if I can assist you in the future.   Screening recommendations/referrals: Colonoscopy: Done 4/6/20107 - repeat not required Mammogram: Done 12/04/2020 - Repeat annually  Bone Density: Done 01/17/2021 - Repeat every 2 years  Recommended yearly ophthalmology/optometry visit for glaucoma screening and checkup Recommended yearly dental visit for hygiene and checkup  Vaccinations: Influenza vaccine: Done 01/24/2020 - Repeat annually  Pneumococcal vaccine: Done 02/08/2001 & 05/01/2013 Tdap vaccine: Done 09/29/2019 - Repeat in 10 years  Shingles vaccine: Done 12/04/2020 - appointment made for second dose   Covid-19: Done 08/07/2019, 08/29/2019 ,& 02/09/2020   Advanced directives: Advance directive discussed with you today. Even though you declined this today, please call our office should you change your mind, and we can give you the proper paperwork for you to fill out.   Conditions/risks identified: Aim for 30 minutes of exercise or brisk walking each day, drink 6-8 glasses of water and eat lots of fruits and vegetables. Try the balance and strength exercises. Consider using a cane for added stability  Next appointment: Follow up in one year for your annual wellness visit    Preventive Care 65 Years and Older, Female Preventive care refers to lifestyle choices and visits with your health care provider that can promote health and wellness. What does preventive care include? A yearly physical exam. This is also called an annual well check. Dental exams once or twice a year. Routine eye exams. Ask your health care provider how often you should have your eyes checked. Personal lifestyle choices, including: Daily care of your teeth and gums. Regular physical activity. Eating a healthy diet. Avoiding  tobacco and drug use. Limiting alcohol use. Practicing safe sex. Taking low-dose aspirin every day. Taking vitamin and mineral supplements as recommended by your health care provider. What happens during an annual well check? The services and screenings done by your health care provider during your annual well check will depend on your age, overall health, lifestyle risk factors, and family history of disease. Counseling  Your health care provider may ask you questions about your: Alcohol use. Tobacco use. Drug use. Emotional well-being. Home and relationship well-being. Sexual activity. Eating habits. History of falls. Memory and ability to understand (cognition). Work and work Statistician. Reproductive health. Screening  You may have the following tests or measurements: Height, weight, and BMI. Blood pressure. Lipid and cholesterol levels. These may be checked every 5 years, or more frequently if you are over 71 years old. Skin check. Lung cancer screening. You may have this screening every year starting at age 57 if you have a 30-pack-year history of smoking and currently smoke or have quit within the past 15 years. Fecal occult blood test (FOBT) of the stool. You may have this test every year starting at age 85. Flexible sigmoidoscopy or colonoscopy. You may have a sigmoidoscopy every 5 years or a colonoscopy every 10 years starting at age 40. Hepatitis C blood test. Hepatitis B blood test. Sexually transmitted disease (STD) testing. Diabetes screening. This is done by checking your blood sugar (glucose) after you have not eaten for a while (fasting). You may have this done every 1-3 years. Bone density scan. This is done to screen for osteoporosis. You may have this done starting at age 46. Mammogram. This may be done every 1-2 years.  Talk to your health care provider about how often you should have regular mammograms. Talk with your health care provider about your test  results, treatment options, and if necessary, the need for more tests. Vaccines  Your health care provider may recommend certain vaccines, such as: Influenza vaccine. This is recommended every year. Tetanus, diphtheria, and acellular pertussis (Tdap, Td) vaccine. You may need a Td booster every 10 years. Zoster vaccine. You may need this after age 75. Pneumococcal 13-valent conjugate (PCV13) vaccine. One dose is recommended after age 60. Pneumococcal polysaccharide (PPSV23) vaccine. One dose is recommended after age 93. Talk to your health care provider about which screenings and vaccines you need and how often you need them. This information is not intended to replace advice given to you by your health care provider. Make sure you discuss any questions you have with your health care provider. Document Released: 05/24/2015 Document Revised: 01/15/2016 Document Reviewed: 02/26/2015 Elsevier Interactive Patient Education  2017 [place] Prevention in the Home Falls can cause injuries. They can happen to people of all ages. There are many things you can do to make your home safe and to help prevent falls. What can I do on the outside of my home? Regularly fix the edges of walkways and driveways and fix any cracks. Remove anything that might make you trip as you walk through a door, such as a raised step or threshold. Trim any bushes or trees on the path to your home. Use bright outdoor lighting. Clear any walking paths of anything that might make someone trip, such as rocks or tools. Regularly check to see if handrails are loose or broken. Make sure that both sides of any steps have handrails. Any raised decks and porches should have guardrails on the edges. Have any leaves, snow, or ice cleared regularly. Use sand or salt on walking paths during winter. Clean up any spills in your garage right away. This includes oil or grease spills. What can I do in the bathroom? Use night  lights. Install grab bars by the toilet and in the tub and shower. Do not use towel bars as grab bars. Use non-skid mats or decals in the tub or shower. If you need to sit down in the shower, use a plastic, non-slip stool. Keep the floor dry. Clean up any water that spills on the floor as soon as it happens. Remove soap buildup in the tub or shower regularly. Attach bath mats securely with double-sided non-slip rug tape. Do not have throw rugs and other things on the floor that can make you trip. What can I do in the bedroom? Use night lights. Make sure that you have a light by your bed that is easy to reach. Do not use any sheets or blankets that are too big for your bed. They should not hang down onto the floor. Have a firm chair that has side arms. You can use this for support while you get dressed. Do not have throw rugs and other things on the floor that can make you trip. What can I do in the kitchen? Clean up any spills right away. Avoid walking on wet floors. Keep items that you use a lot in easy-to-reach places. If you need to reach something above you, use a strong step stool that has a grab bar. Keep electrical cords out of the way. Do not use floor polish or wax that makes floors slippery. If you must use wax, use non-skid floor wax.  Do not have throw rugs and other things on the floor that can make you trip. What can I do with my stairs? Do not leave any items on the stairs. Make sure that there are handrails on both sides of the stairs and use them. Fix handrails that are broken or loose. Make sure that handrails are as long as the stairways. Check any carpeting to make sure that it is firmly attached to the stairs. Fix any carpet that is loose or worn. Avoid having throw rugs at the top or bottom of the stairs. If you do have throw rugs, attach them to the floor with carpet tape. Make sure that you have a light switch at the top of the stairs and the bottom of the stairs. If  you do not have them, ask someone to add them for you. What else can I do to help prevent falls? Wear shoes that: Do not have high heels. Have rubber bottoms. Are comfortable and fit you well. Are closed at the toe. Do not wear sandals. If you use a stepladder: Make sure that it is fully opened. Do not climb a closed stepladder. Make sure that both sides of the stepladder are locked into place. Ask someone to hold it for you, if possible. Clearly mark and make sure that you can see: Any grab bars or handrails. First and last steps. Where the edge of each step is. Use tools that help you move around (mobility aids) if they are needed. These include: Canes. Walkers. Scooters. Crutches. Turn on the lights when you go into a dark area. Replace any light bulbs as soon as they burn out. Set up your furniture so you have a clear path. Avoid moving your furniture around. If any of your floors are uneven, fix them. If there are any pets around you, be aware of where they are. Review your medicines with your doctor. Some medicines can make you feel dizzy. This can increase your chance of falling. Ask your doctor what other things that you can do to help prevent falls. This information is not intended to replace advice given to you by your health care provider. Make sure you discuss any questions you have with your health care provider. Document Released: 02/21/2009 Document Revised: 10/03/2015 Document Reviewed: 06/01/2014 Elsevier Interactive Patient Education  2017 [employer].

## 2021-03-17 NOTE — Progress Notes (Addendum)
Subjective:   Rachel Walton is a 87 y.o. female who presents for Medicare Annual (Subsequent) preventive examination.  Virtual Visit via Telephone Note  I connected with  Rachel Walton on 03/17/21 at  9:00 AM EST by telephone and verified that I am speaking with the correct person using two identifiers.  Location: Patient: Home Provider: [facility] Persons participating in the virtual visit: patient/Nurse Health Advisor   I discussed the limitations, risks, security and privacy concerns of performing an evaluation and management service by telephone and the availability of in person appointments. The patient expressed understanding and agreed to proceed.  Interactive audio and video telecommunications were attempted between this nurse and patient, however failed, due to patient having technical difficulties OR patient did not have access to video capability.  We continued and completed visit with audio only.  Some vital signs may be absent or patient reported.   Rachel Seago E Nilsa Macht, LPN   Review of Systems     Cardiac Risk Factors include: advanced age (>32men, >54 women);sedentary lifestyle;dyslipidemia;hypertension     Objective:    Today's Vitals   03/17/21 0908 03/17/21 0909  Weight: 112 lb (50.8 kg)   Height: 5\' 1"  (1.549 m)   PainSc:  5    Body mass index is 21.16 kg/m.  Advanced Directives 03/17/2021 09/13/2020 03/20/2020 09/29/2019 07/08/2019 04/13/2018 12/30/2017  Does Patient Have a Medical Advance Directive? No No No No No No No  Would patient like information on creating a medical advance directive? No - Patient declined No - Patient declined - No - Patient declined - No - Patient declined No - Patient declined    Current Medications (verified) Outpatient Encounter Medications as of 03/17/2021  Medication Sig   acetaminophen (TYLENOL) 500 MG tablet Take 500 mg by mouth every 6 (six) hours as needed for mild pain.   CALCIUM PO Take by mouth daily.   Cholecalciferol (VITAMIN  D) 50 MCG (2000 UT) CAPS Take 2,000 Units by mouth daily.   fluticasone (FLONASE) 50 MCG/ACT nasal spray Place 2 sprays into both nostrils daily.   lisinopril (ZESTRIL) 20 MG tablet Take 1 tablet (20 mg total) by mouth daily.   mesalamine (LIALDA) 1.2 g EC tablet Take 2 tablets (2.4 g total) by mouth daily with breakfast.   Multiple Vitamins-Minerals (PRESERVISION AREDS PO) Take by mouth.   OVER THE COUNTER MEDICATION Take 1 tablet by mouth daily. Stress B with Zinc   potassium chloride (KLOR-CON) 10 MEQ tablet TAKE ONE (1) TABLET EACH DAY   vitamin B-12 (CYANOCOBALAMIN) 1000 MCG tablet Take 1,000 mcg by mouth daily.   vitamin C (ASCORBIC ACID) 500 MG tablet Take 500 mg by mouth daily.   No facility-administered encounter medications on file as of 03/17/2021.    Allergies (verified) Alendronate sodium, Lexapro [escitalopram oxalate], Amoxicillin, Aspirin, Clindamycin/lincomycin, Keflex [cephalexin], Levofloxacin, Prednisone, Sudafed [pseudoephedrine hcl], Sulfa antibiotics, Toprol xl [metoprolol succinate], Trimethoprim, and Zithromax [azithromycin]   History: Past Medical History:  Diagnosis Date   Acute gastric ulcer    Allergy    Anxiety    Atrophic vaginitis    Barrett's esophagus    Basal cell carcinoma of nose     under nose   Cataract    Depression    Diverticulosis    Esophageal stricture    Esophageal ulcer    Fatty liver    Gall stones 06/15/2013   GERD (gastroesophageal reflux disease)    Heart murmur    Hiatal hernia    Hx  of adenomatous colonic polyps    Hyperlipidemia    Hypertension    Hypokalemia    IBS (irritable bowel syndrome)    Kyphosis    Lymphocytic colitis    Macular degeneration    "?wet or dry; ? 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    Tremor    Vitamin D deficiency    Past Surgical History:   Procedure Laterality Date   ABDOMINAL HYSTERECTOMY  1980   APPENDECTOMY     BACK SURGERY     BREAST BIOPSY Left    CARPAL TUNNEL RELEASE Left 09/13/2020   Procedure: CARPAL TUNNEL RELEASE;  Surgeon: Roseanne Kaufman, MD;  Location: [facility];  Service: Orthopedics;  Laterality: Left;   CATARACT EXTRACTION W/ INTRAOCULAR LENS  IMPLANT, BILATERAL Bilateral    COLONOSCOPY     ESOPHAGOGASTRODUODENOSCOPY (EGD) WITH ESOPHAGEAL DILATION  X 1   EYE SURGERY     LUMBAR DISC SURGERY     "fragmented disc"   ORIF RADIAL FRACTURE Left 09/13/2020   Procedure: HARDWARE REMOVAL, OPEN REDUCTION INTERNAL FIXATION LEFT RADIUS;  Surgeon: Roseanne Kaufman, MD;  Location: [facility];  Service: Orthopedics;  Laterality: Left;  Block with IV sedation   POLYPECTOMY     SQUAMOUS CELL CARCINOMA EXCISION     nose tip   Family History  Problem Relation Age of Onset   Stroke Mother    Heart disease Father        MI   Heart attack Father    Cancer Sister        Brain tumor   Hypertension Sister    Osteoporosis Sister    Hypertension Sister    Breast cancer Sister 70   Hypertension Sister    Osteoporosis Sister    Hypertension Sister    Hypertension Sister    Stroke Sister    Hypertension Sister    Hypertension Sister    Colon cancer Brother    Heart disease Brother    Diabetes Son 74   Diabetes Son 30   Brain cancer Son        deceased   Cancer Son        brain   Diabetes Maternal Uncle    Colon cancer Other        nephew   Thyroid cancer Other        niece   Breast cancer Other    Breast cancer Other    Breast cancer Other    Colon polyps Neg Hx    Esophageal cancer Neg Hx    Rectal cancer Neg Hx    Stomach cancer Neg Hx    Social History   Socioeconomic History   Marital status: Married    Spouse name: Gwyndolyn Saxon   Number of children: 3   Years of education: Not on file   Highest education level: Not on file  Occupational History   Occupation: Retired  Tobacco Use   Smoking status: Never    Smokeless tobacco: Never  Vaping Use   Vaping Use: Never used  Substance and Sexual Activity   Alcohol use: No    Alcohol/week: 0.0 standard drinks   Drug use: No   Sexual activity: Not Currently  Other Topics Concern   Not on file  Social History Narrative   One son  in [place]   One son, Tharon Aquas lives nearby and visits almost daily   Social Determinants of Health   Financial Resource Strain: Low Risk    Difficulty of Paying Living Expenses: Not hard at all  Food Insecurity: No Food Insecurity   Worried About Charity fundraiser in the Last Year: Never true   Arboriculturist in the Last Year: Never true  Transportation Needs: No Transportation Needs   Lack of Transportation (Medical): No   Lack of Transportation (Non-Medical): No  Physical Activity: Inactive   Days of Exercise per Week: 0 days   Minutes of Exercise per Session: 0 min  Stress: Stress Concern Present   Feeling of Stress : To some extent  Social Connections: Engineer, building services of Communication with Friends and Family: More than three times a week   Frequency of Social Gatherings with Friends and Family: More than three times a week   Attends Religious Services: 1 to 4 times per year   Active Member of [employer] or Organizations: Yes   Attends Archivist Meetings: 1 to 4 times per year   Marital Status: Married    Tobacco Counseling Counseling given: Not Answered   Clinical Intake:  Pre-visit preparation completed: Yes  Pain : 0-10 Pain Score: 5  Pain Type: Chronic pain Pain Location: Arm Pain Orientation: Right, Left Pain Descriptors / Indicators: Aching, Discomfort Pain Onset: More than a month ago Pain Frequency: Intermittent     BMI - recorded: 21.16 Nutritional Status: BMI of 19-24  Normal Nutritional Risks: None Diabetes: No  How often do you need to have someone help you when you read instructions, pamphlets, or other written materials from your doctor or  pharmacy?: 1 - Never  Diabetic? no  Interpreter Needed?: No  Information entered by :: Antawan Mchugh, LPN   Activities of Daily Living In your present state of health, do you have any difficulty performing the following activities: 03/17/2021 09/13/2020  Hearing? [place]? Y -  Difficulty concentrating or making decisions? N -  Walking or climbing stairs? N -  Dressing or bathing? N -  Doing errands, shopping? N N  Preparing Food and eating ? N -  Using the Toilet? N -  In the past six months, have you accidently leaked urine? N -  Do you have problems with loss of bowel control? N -  Managing your Medications? N -  Managing your Finances? N -  Housekeeping or managing your Housekeeping? N -  Some recent data might be hidden    Patient Care Team: Loman Brooklyn, FNP as PCP - General (Family Medicine) Alphonsa Overall, MD as Consulting Physician (General Surgery) Irine Seal, MD as Attending Physician (Urology) Jerrell Belfast, MD as Consulting Physician (Otolaryngology) Justice Britain, MD as Consulting Physician (Orthopedic Surgery) Harriett Sine, MD as Consulting Physician (Dermatology) Pyrtle, Lajuan Lines, MD as Consulting Physician (Gastroenterology) Sanda Klein, MD as Consulting Physician (Cardiology) Marica Otter, OD (Optometry)  Indicate any recent Medical Services you may have received from other than [facility] providers in the past year (date may be approximate).     Assessment:   This is a routine wellness examination for Rachel Walton.  Hearing/Vision screen Hearing Screening - Comments:: C/o mild hearing difficulties - declines hearing aids Vision Screening - Comments:: Wears rx glasses - behind on annual eye exams with [facility] in [place] issues and exercise activities discussed: Current Exercise Habits: The patient does not participate in  regular exercise at present, Exercise limited by: orthopedic condition(s)   Goals Addressed             This  Visit's Progress    Exercise 3x per week (30 min per time)   Not on track      Depression Screen [facility] 2/9 Scores 03/17/2021 01/15/2021 12/04/2020 12/04/2020 09/06/2020 03/05/2020 05/04/2019  PHQ - 2 Score 2 2 1 1  0 3 1  PHQ- 9 Score 6 6 3  - - 11 6    Fall Risk Fall Risk  03/17/2021 01/15/2021 12/04/2020 12/02/2020 09/06/2020  Falls in the past year? 1 1 1 1 1   Number falls in past yr: 1 1 0 0 0  Injury with Fall? 1 1 1 1 1   Comment - - - - -  Risk for fall due to : History of fall(s);Orthopedic patient;Impaired vision - History of fall(s) History of fall(s) History of fall(s)  Follow up Education provided;Falls prevention discussed Falls prevention discussed Falls evaluation completed Education provided Falls evaluation completed    FALL RISK PREVENTION PERTAINING TO THE HOME:  Any stairs in or around the home? Yes  If so, are there any without handrails? No  Home free of loose throw rugs in walkways, pet beds, electrical cords, etc? Yes  Adequate lighting in your home to reduce risk of falls? Yes   ASSISTIVE DEVICES UTILIZED TO PREVENT FALLS:  Life alert? No  Use of a cane, walker or w/c? No  Grab bars in the bathroom? No  Shower chair or bench in shower? Yes  Elevated toilet seat or a handicapped toilet? Yes   TIMED UP AND GO:  Was the test performed? No . Telephonic visit  Cognitive Function: MMSE - Mini Mental State Exam 01/08/2017 12/30/2015 08/30/2014  Orientation to time 5 5 5   Orientation to Place 5 5 5   Registration 3 3 3   Attention/ Calculation 5 5 5   Recall 3 1 3   Language- name 2 objects 2 2 2   Language- repeat 1 1 1   Language- follow 3 step command 3 1 3   Language- read & follow direction 1 1 1   Write a sentence 1 1 1   Copy design 1 1 1   Total score 30 26 30      6CIT Screen 03/17/2021  What Year? 0 points  What month? 0 points  What time? 0 points  Count back from 20 0 points  Months in reverse 0 points  Repeat phrase 0 points  Total Score 0     Immunizations Immunization History  Administered Date(s) Administered   Fluad Quad(high Dose 65+) 01/24/2020   Influenza Whole 02/08/2010   Influenza, High Dose Seasonal PF 04/13/2016, 03/11/2017, 03/11/2018   Influenza,inj,Quad PF,6+ Mos 03/07/2013, 04/12/2014, 03/29/2015, 02/22/2019   Moderna Sars-Covid-2 Vaccination 08/07/2019, 08/29/2019, 02/09/2020   Pneumococcal Conjugate-13 05/01/2013   Pneumococcal Polysaccharide-23 02/08/2001   Td 05/11/2006   Tdap 01/08/2017, 09/29/2019   Zoster Recombinat (Shingrix) 12/04/2020   Zoster, Live 09/09/2006    TDAP status: Up to date  Flu Vaccine status: Due, Education has been provided regarding the importance of this vaccine. Advised may receive this vaccine at local pharmacy or Health Dept. Aware to provide a copy of the vaccination record if obtained from local pharmacy or Health Dept. Verbalized acceptance and understanding.  Pneumococcal vaccine status: Up to date  Covid-19 vaccine status: Completed vaccines  Qualifies for Shingles Vaccine? Yes   Zostavax completed Yes   Shingrix Completed?: No.    Education has been provided regarding  the importance of this vaccine. Patient has been advised to call insurance company to determine out of pocket expense if they have not yet received this vaccine. Advised may also receive vaccine at local pharmacy or Health Dept. Verbalized acceptance and understanding.  Screening Tests Health Maintenance  Topic Date Due   COVID-19 Vaccine (4 - Booster) 04/05/2020   Zoster Vaccines- Shingrix (2 of 2) 01/29/2021   INFLUENZA VACCINE  08/08/2021 (Originally 12/09/2020)   MAMMOGRAM  12/04/2021   DEXA SCAN  01/18/2024   TETANUS/TDAP  09/28/2029   Pneumonia Vaccine 81+ Years old  Completed   HPV VACCINES  Aged Out    Health Maintenance  Health Maintenance Due  Topic Date Due   COVID-19 Vaccine (4 - Booster) 04/05/2020   Zoster Vaccines- Shingrix (2 of 2) 01/29/2021    Colorectal cancer  screening: No longer required.   Mammogram status: Completed 12/04/2020. Repeat every year  Bone Density status: Completed 01/17/2021. Results reflect: Bone density results: OSTEOPOROSIS. Repeat every 2 years.  Lung Cancer Screening: (Low Dose CT Chest recommended if Age 69-80 years, 30 pack-year currently smoking OR have quit w/in 15years.) does not qualify.   Additional Screening:  Hepatitis C Screening: does not qualif  Vision Screening: Recommended annual ophthalmology exams for early detection of glaucoma and other disorders of the eye. Is the patient up to date with their annual eye exam?  No  Who is the provider or what is the name of the office in which the patient attends annual eye exams? [facility] If pt is not established with a provider, would they like to be referred to a provider to establish care? No .   Dental Screening: Recommended annual dental exams for proper oral hygiene  Community Resource Referral / Chronic Care Management: CRR required this visit?  No   CCM required this visit?  No      Plan:     I have personally reviewed and noted the following in the patient's chart:   Medical and social history Use of alcohol, tobacco or illicit drugs  Current medications and supplements including opioid prescriptions.  Functional ability and status Nutritional status Physical activity Advanced directives List of other physicians Hospitalizations, surgeries, and ER visits in previous 12 months Vitals Screenings to include cognitive, depression, and falls Referrals and appointments  In addition, I have reviewed and discussed with patient certain preventive protocols, quality metrics, and best practice recommendations. A written personalized care plan for preventive services as well as general preventive health recommendations were provided to patient.     Sandrea Hammond, LPN   06/15/8525   Nurse Notes: None

## 2021-03-18 ENCOUNTER — Other Ambulatory Visit (INDEPENDENT_AMBULATORY_CARE_PROVIDER_SITE_OTHER): Payer: Medicare PPO

## 2021-03-18 DIAGNOSIS — R079 Chest pain, unspecified: Secondary | ICD-10-CM | POA: Diagnosis not present

## 2021-03-18 DIAGNOSIS — R531 Weakness: Secondary | ICD-10-CM | POA: Diagnosis not present

## 2021-03-18 DIAGNOSIS — Z8711 Personal history of peptic ulcer disease: Secondary | ICD-10-CM

## 2021-03-18 LAB — COMPREHENSIVE METABOLIC PANEL
ALT: 10 U/L (ref 0–35)
AST: 19 U/L (ref 0–37)
Albumin: 3.8 g/dL (ref 3.5–5.2)
Alkaline Phosphatase: 45 U/L (ref 39–117)
BUN: 10 mg/dL (ref 6–23)
CO2: 29 mEq/L (ref 19–32)
Calcium: 9.1 mg/dL (ref 8.4–10.5)
Chloride: 103 mEq/L (ref 96–112)
Creatinine, Ser: 0.85 mg/dL (ref 0.40–1.20)
GFR: 60.82 mL/min (ref >60.00)
Glucose, Bld: 96 mg/dL (ref 70–99)
Potassium: 4.2 mEq/L (ref 3.5–5.1)
Sodium: 139 mEq/L (ref 135–145)
Total Bilirubin: 0.6 mg/dL (ref 0.2–1.2)
Total Protein: 6.5 g/dL (ref 6.0–8.3)

## 2021-03-18 LAB — CBC WITH DIFFERENTIAL/PLATELET
Basophils Absolute: 0 10*3/uL (ref 0.0–0.1)
Basophils Relative: 0.8 % (ref 0.0–3.0)
Eosinophils Absolute: 0.1 10*3/uL (ref 0.0–0.7)
Eosinophils Relative: 2.5 % (ref 0.0–5.0)
HCT: 39.8 % (ref 36.0–46.0)
Hemoglobin: 13 g/dL (ref 12.0–15.0)
Lymphocytes Relative: 46.1 % — ABNORMAL HIGH (ref 12.0–46.0)
Lymphs Abs: 2.5 10*3/uL (ref 0.7–4.0)
MCHC: 32.7 g/dL (ref 30.0–36.0)
MCV: 89.5 fl (ref 78.0–100.0)
Monocytes Absolute: 0.4 10*3/uL (ref 0.1–1.0)
Monocytes Relative: 7.5 % (ref 3.0–12.0)
Neutro Abs: 2.3 10*3/uL (ref 1.4–7.7)
Neutrophils Relative %: 43.1 % (ref 43.0–77.0)
Platelets: 241 10*3/uL (ref 150.0–400.0)
RBC: 4.44 Mil/uL (ref 3.87–5.11)
RDW: 12.9 % (ref 11.5–15.5)
WBC: 5.4 10*3/uL (ref 4.0–10.5)

## 2021-03-20 ENCOUNTER — Other Ambulatory Visit: Payer: Self-pay | Admitting: Family Medicine

## 2021-03-20 DIAGNOSIS — E876 Hypokalemia: Secondary | ICD-10-CM

## 2021-03-21 ENCOUNTER — Other Ambulatory Visit: Payer: Self-pay

## 2021-03-21 ENCOUNTER — Ambulatory Visit (AMBULATORY_SURGERY_CENTER): Payer: Medicare PPO | Admitting: Internal Medicine

## 2021-03-21 ENCOUNTER — Encounter: Payer: Self-pay | Admitting: Internal Medicine

## 2021-03-21 VITALS — BP 124/66 | HR 64 | Temp 96.6°F | Resp 10 | Ht 61.0 in | Wt 109.0 lb

## 2021-03-21 DIAGNOSIS — R079 Chest pain, unspecified: Secondary | ICD-10-CM

## 2021-03-21 DIAGNOSIS — K219 Gastro-esophageal reflux disease without esophagitis: Secondary | ICD-10-CM | POA: Diagnosis not present

## 2021-03-21 DIAGNOSIS — K449 Diaphragmatic hernia without obstruction or gangrene: Secondary | ICD-10-CM | POA: Diagnosis not present

## 2021-03-21 DIAGNOSIS — K222 Esophageal obstruction: Secondary | ICD-10-CM | POA: Diagnosis not present

## 2021-03-21 DIAGNOSIS — K2270 Barrett's esophagus without dysplasia: Secondary | ICD-10-CM | POA: Diagnosis not present

## 2021-03-21 DIAGNOSIS — Z8719 Personal history of other diseases of the digestive system: Secondary | ICD-10-CM

## 2021-03-21 DIAGNOSIS — I10 Essential (primary) hypertension: Secondary | ICD-10-CM | POA: Diagnosis not present

## 2021-03-21 DIAGNOSIS — R0789 Other chest pain: Secondary | ICD-10-CM | POA: Diagnosis not present

## 2021-03-21 MED ORDER — FAMOTIDINE 20 MG PO TABS: 20.0000 mg | ORAL_TABLET | Freq: Two times a day (BID) | ORAL | 1 refills | Status: DC

## 2021-03-21 MED ORDER — SODIUM CHLORIDE 0.9 % IV SOLN: 500.0000 mL | Freq: Once | INTRAVENOUS | Status: DC

## 2021-03-21 NOTE — Op Note (Signed)
[facility] Patient Name: Rachel Walton Procedure Date: 03/21/2021 9:12 AM MRN: [id number] Endoscopist: Jerene Bears , MD Age: 87 Referring MD:  Date of Birth: [birth year] Gender: Female Account #: [id number] Procedure:                Upper GI endoscopy Indications:              Gastro-esophageal reflux disease, Chest pain (non                            cardiac) Medicines:                Monitored Anesthesia Care Procedure:                Pre-Anesthesia Assessment:                           - Prior to the procedure, a History and Physical                            was performed, and patient medications and                            allergies were reviewed. The patient's tolerance of                            previous anesthesia was also reviewed. The risks                            and benefits of the procedure and the sedation                            options and risks were discussed with the patient.                            All questions were answered, and informed consent                            was obtained. Prior Anticoagulants: The patient has                            taken no previous anticoagulant or antiplatelet                            agents. ASA Grade Assessment: III - A patient with                            severe systemic disease. After reviewing the risks                            and benefits, the patient was deemed in                            satisfactory condition to undergo the procedure.  After obtaining informed consent, the endoscope was                            passed under direct vision. Throughout the                            procedure, the patient's blood pressure, pulse, and                            oxygen saturations were monitored continuously. The                            GIF [id number] #[id number] was introduced through the                            mouth, and advanced to the second part of  duodenum.                            The upper GI endoscopy was accomplished without                            difficulty. The patient tolerated the procedure                            well. Scope In: Scope Out: Findings:                 A non-obstructing Schatzki ring was found at the                            gastroesophageal junction.                           A 3 cm hiatal hernia was present.                           The gastroesophageal flap valve was visualized                            endoscopically and classified as Hill Grade IV (no                            fold, wide open lumen, hiatal hernia present).                           The entire examined stomach was normal.                           The examined duodenum was normal. Complications:            No immediate complications. Estimated Blood Loss:     Estimated blood loss: none. Impression:               - Non-obstructing Schatzki ring.                           -  3 cm hiatal hernia.                           - Gastroesophageal flap valve classified as Hill                            Grade IV (no fold, wide open lumen, hiatal hernia                            present).                           - Normal stomach.                           - Normal examined duodenum.                           - No specimens collected. Recommendation:           - Patient has a contact number available for                            emergencies. The signs and symptoms of potential                            delayed complications were discussed with the                            patient. Return to normal activities tomorrow.                            Written discharge instructions were provided to the                            patient.                           - Resume previous diet.                           - Continue present medications.                           - For indigestion famotidine 20 mg twice daily as                             needed can be used.                           - Atypical chest pain episodes could be esophageal                            spasm versus anxiety (patient previously on                            long-term lorazepam which helped this episodes).  Lorazepam which could help spasm and anxiety and                            was helpful to her in the past could be consider at                            low dose twice daily as needed (buspirone was tried                            by PCP and not tolerated). Jerene Bears, MD 03/21/2021 9:30:39 AM This report has been signed electronically.

## 2021-03-21 NOTE — Patient Instructions (Addendum)
Read all of the handouts given to you by your recovery room nurse.  YOU HAD AN ENDOSCOPIC PROCEDURE TODAY AT [facility]:   Refer to the procedure report that was given to you for any specific questions about what was found during the examination.  If the procedure report does not answer your questions, please call your gastroenterologist to clarify.  If you requested that your care partner not be given the details of your procedure findings, then the procedure report has been included in a sealed envelope for you to review at your convenience later.  YOU SHOULD EXPECT: Some feelings of bloating in the abdomen. Passage of more gas than usual.  Walking can help get rid of the air that was put into your GI tract during the procedure and reduce the bloating.   Please Note:  You might notice some irritation and congestion in your nose or some drainage.  This is from the oxygen used during your procedure.  There is no need for concern and it should clear up in a day or so.  SYMPTOMS TO REPORT IMMEDIATELY:   Following upper endoscopy (EGD)  Vomiting of blood or coffee ground material  New chest pain or pain under the shoulder blades  Painful or persistently difficult swallowing  New shortness of breath  Fever of 100F or higher  Black, tarry-looking stools  For urgent or emergent issues, a gastroenterologist can be reached at any hour by calling [phone]. Do not use MyChart messaging for urgent concerns.    DIET:  We do recommend a small meal at first, but then you may proceed to your regular diet.  Drink plenty of fluids but you should avoid alcoholic beverages for 24 hours.  ACTIVITY:  You should plan to take it easy for the rest of today and you should NOT DRIVE or use heavy machinery until tomorrow (because of the sedation medicines used during the test).    FOLLOW UP: Our staff will call the number listed on your records 48-72 hours following your procedure to check  on you and address any questions or concerns that you may have regarding the information given to you following your procedure. If we do not reach you, we will leave a message.  We will attempt to reach you two times.  During this call, we will ask if you have developed any symptoms of COVID 19. If you develop any symptoms (ie: fever, flu-like symptoms, shortness of breath, cough etc.) before then, please call [phone].  If you test positive for Covid 19 in the 2 weeks post procedure, please call and report this information to [place].     SIGNATURES/CONFIDENTIALITY: You and/or your care partner have signed paperwork which will be entered into your electronic medical record.  These signatures attest to the fact that that the information above on your After Visit Summary has been reviewed and is understood.  Full responsibility of the confidentiality of this discharge information lies with you and/or your care-partner.

## 2021-03-21 NOTE — Progress Notes (Signed)
Report to PACU, RN, vss, BBS= Clear.  

## 2021-03-21 NOTE — Progress Notes (Signed)
Pt's states no medical or surgical changes since previsit or office visit. VS done by [place].

## 2021-03-21 NOTE — Progress Notes (Signed)
tAKEN [place].

## 2021-03-21 NOTE — Progress Notes (Signed)
GASTROENTEROLOGY PROCEDURE H&P NOTE   Primary Care Physician: Loman Brooklyn, FNP    Reason for Procedure:  Atypical chest pain, GERD and history of Barrett's esophagus  Plan:    EGD  Patient is appropriate for endoscopic procedure(s) in the ambulatory ([place]) setting.  The nature of the procedure, as well as the risks, benefits, and alternatives were carefully and thoroughly reviewed with the patient. Ample time for discussion and questions allowed. The patient understood, was satisfied, and agreed to proceed.     HPI: Rachel Walton is a 87 y.o. female who presents for EGD.  Seen just over 30 days ago in the office.  See this note for details.  No change in clinical history since that time.  Cardiology cleared her for upper endoscopy today.  Medical history as below.  No chest pain or shortness of breath today.  No abdominal pain.  Past Medical History:  Diagnosis Date   Acute gastric ulcer    Allergy    Anxiety    Atrophic vaginitis    Barrett's esophagus    Basal cell carcinoma of nose     under nose   Cataract    Depression    Diverticulosis    Esophageal stricture    Esophageal ulcer    Fatty liver    Gall stones 06/15/2013   GERD (gastroesophageal reflux disease)    Heart murmur    Hiatal hernia    Hx of adenomatous colonic polyps    Hyperlipidemia    Hypertension    Hypokalemia    IBS (irritable bowel syndrome)    Kyphosis    Lymphocytic colitis    Macular degeneration    "?wet or dry; ? 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    Tremor    Vitamin D deficiency     Past Surgical History:  Procedure Laterality Date   ABDOMINAL HYSTERECTOMY  1980   APPENDECTOMY     BACK SURGERY     BREAST BIOPSY Left    CARPAL TUNNEL RELEASE Left 09/13/2020   Procedure: CARPAL TUNNEL RELEASE;  Surgeon: Roseanne Kaufman,  MD;  Location: [facility];  Service: Orthopedics;  Laterality: Left;   CATARACT EXTRACTION W/ INTRAOCULAR LENS  IMPLANT, BILATERAL Bilateral    COLONOSCOPY     ESOPHAGOGASTRODUODENOSCOPY (EGD) WITH ESOPHAGEAL DILATION  X 1   EYE SURGERY     LUMBAR DISC SURGERY     "fragmented disc"   ORIF RADIAL FRACTURE Left 09/13/2020   Procedure: HARDWARE REMOVAL, OPEN REDUCTION INTERNAL FIXATION LEFT RADIUS;  Surgeon: Roseanne Kaufman, MD;  Location: [facility];  Service: Orthopedics;  Laterality: Left;  Block with IV sedation   POLYPECTOMY     SQUAMOUS CELL CARCINOMA EXCISION     nose tip    Prior to Admission medications   Medication Sig Start Date End Date Taking? Authorizing Provider  CALCIUM PO Take by mouth daily.   Yes [provider]  Cholecalciferol (VITAMIN D) 50 MCG (2000 UT) CAPS Take 2,000 Units by mouth daily.   Yes [provider]  fluticasone (FLONASE) 50 MCG/ACT nasal spray Place 2 sprays into both nostrils daily. 01/15/21  Yes Hendricks Limes F, FNP  lisinopril (ZESTRIL) 20 MG tablet Take 1 tablet (20 mg total) by mouth daily. 12/17/20  Yes Hendricks Limes  F, FNP  mesalamine (LIALDA) 1.2 g EC tablet Take 2 tablets (2.4 g total) by mouth daily with breakfast. 02/17/21  Yes Kallum Jorgensen, Lajuan Lines, MD  Multiple Vitamins-Minerals (PRESERVISION AREDS PO) Take by mouth.   Yes [provider]  OVER THE COUNTER MEDICATION Take 1 tablet by mouth daily. Stress B with Zinc   Yes [provider]  potassium chloride (KLOR-CON) 10 MEQ tablet TAKE ONE (1) TABLET EACH DAY 03/20/21  Yes Hendricks Limes F, FNP  vitamin B-12 (CYANOCOBALAMIN) 1000 MCG tablet Take 1,000 mcg by mouth daily.   Yes [provider]  vitamin C (ASCORBIC ACID) 500 MG tablet Take 500 mg by mouth daily.   Yes [provider]  acetaminophen (TYLENOL) 500 MG tablet Take 500 mg by mouth every 6 (six) hours as needed for mild pain.    [provider]    Current Outpatient Medications  Medication  Sig Dispense Refill   CALCIUM PO Take by mouth daily.     Cholecalciferol (VITAMIN D) 50 MCG (2000 UT) CAPS Take 2,000 Units by mouth daily.     fluticasone (FLONASE) 50 MCG/ACT nasal spray Place 2 sprays into both nostrils daily. 16 g 6   lisinopril (ZESTRIL) 20 MG tablet Take 1 tablet (20 mg total) by mouth daily. 90 tablet 1   mesalamine (LIALDA) 1.2 g EC tablet Take 2 tablets (2.4 g total) by mouth daily with breakfast. 60 tablet 2   Multiple Vitamins-Minerals (PRESERVISION AREDS PO) Take by mouth.     OVER THE COUNTER MEDICATION Take 1 tablet by mouth daily. Stress B with Zinc     potassium chloride (KLOR-CON) 10 MEQ tablet TAKE ONE (1) TABLET EACH DAY 90 tablet 0   vitamin B-12 (CYANOCOBALAMIN) 1000 MCG tablet Take 1,000 mcg by mouth daily.     vitamin C (ASCORBIC ACID) 500 MG tablet Take 500 mg by mouth daily.     acetaminophen (TYLENOL) 500 MG tablet Take 500 mg by mouth every 6 (six) hours as needed for mild pain.     Current Facility-Administered Medications  Medication Dose Route Frequency Provider Last Rate Last Admin   0.9 %  sodium chloride infusion  500 mL Intravenous Once Kaneisha Ellenberger, Lajuan Lines, MD        Allergies as of 03/21/2021 - Review Complete 03/21/2021  Allergen Reaction Noted   Alendronate sodium Other (See Comments) 08/24/2010   Lexapro [escitalopram oxalate] Other (See Comments) 04/19/2018   Amoxicillin Other (See Comments) 06/07/2014   Aspirin Other (See Comments) 05/20/2016   Clindamycin/lincomycin Other (See Comments) 03/17/2018   Keflex [cephalexin] Other (See Comments) 03/17/2018   Levofloxacin Other (See Comments) 02/19/2015   Prednisone Other (See Comments) 08/24/2010   Sudafed [pseudoephedrine hcl] Other (See Comments) 08/24/2010   Sulfa antibiotics Hives 08/24/2010   Toprol xl [metoprolol succinate] Other (See Comments) 08/24/2010   Trimethoprim  11/17/2012   Zithromax [azithromycin] Rash 03/07/2019    Family History  Problem Relation Age of Onset    Stroke Mother    Heart disease Father        MI   Heart attack Father    Cancer Sister        Brain tumor   Hypertension Sister    Osteoporosis Sister    Hypertension Sister    Breast cancer Sister 45   Hypertension Sister    Osteoporosis Sister    Hypertension Sister    Hypertension Sister    Stroke Sister    Hypertension Sister    Hypertension  Sister    Colon cancer Brother    Heart disease Brother    Diabetes Son 54   Diabetes Son 59   Brain cancer Son        deceased   Cancer Son        brain   Diabetes Maternal Uncle    Colon cancer Other        nephew   Thyroid cancer Other        niece   Breast cancer Other    Breast cancer Other    Breast cancer Other    Colon polyps Neg Hx    Esophageal cancer Neg Hx    Rectal cancer Neg Hx    Stomach cancer Neg Hx     Social History   Socioeconomic History   Marital status: Married    Spouse name: Gwyndolyn Saxon   Number of children: 3   Years of education: Not on file   Highest education level: Not on file  Occupational History   Occupation: Retired  Tobacco Use   Smoking status: Never   Smokeless tobacco: Never  Vaping Use   Vaping Use: Never used  Substance and Sexual Activity   Alcohol use: No    Alcohol/week: 0.0 standard drinks   Drug use: No   Sexual activity: Not Currently  Other Topics Concern   Not on file  Social History Narrative   One son in [place]   One son, Tharon Aquas lives nearby and visits almost daily   Social Determinants of Health   Financial Resource Strain: Low Risk    Difficulty of Paying Living Expenses: Not hard at all  Food Insecurity: No Food Insecurity   Worried About Charity fundraiser in the Last Year: Never true   Arboriculturist in the Last Year: Never true  Transportation Needs: No Transportation Needs   Lack of Transportation (Medical): No   Lack of Transportation (Non-Medical): No  Physical Activity: Inactive   Days of Exercise per Week: 0 days   Minutes of Exercise  per Session: 0 min  Stress: Stress Concern Present   Feeling of Stress : To some extent  Social Connections: Engineer, building services of Communication with Friends and Family: More than three times a week   Frequency of Social Gatherings with Friends and Family: More than three times a week   Attends Religious Services: 1 to 4 times per year   Active Member of [employer] or Organizations: Yes   Attends Archivist Meetings: 1 to 4 times per year   Marital Status: Married  Human resources officer Violence: Not At Risk   Fear of Current or Ex-Partner: No   Emotionally Abused: No   Physically Abused: No   Sexually Abused: No    Physical Exam: Vital signs in last 24 hours: @BP  (!) 147/59   Pulse 68   Temp (!) 96.6 F (35.9 C)   Ht 5\' 1"  (1.549 m)   Wt 109 lb (49.4 kg)   BMI 20.60 kg/m  GEN: NAD EYE: Sclerae anicteric ENT: MMM CV: Non-tachycardic Pulm: CTA b/l GI: Soft, NT/ND NEURO:  Alert & Oriented x 3   Zenovia Jarred, MD [facility]  03/21/2021 9:13 AM

## 2021-03-25 ENCOUNTER — Telehealth: Payer: Self-pay | Admitting: *Deleted

## 2021-03-25 NOTE — Telephone Encounter (Signed)
  Follow up Call-  Call back number 03/21/2021  Post procedure Call Back phone  # [phone]  Some recent data might be hidden     Patient questions:  Do you have a fever, pain , or abdominal swelling? No. Pain Score  0 *  Have you tolerated food without any problems? Yes.    Have you been able to return to your normal activities? Yes.    Do you have any questions about your discharge instructions: Diet   No. Medications  No. Follow up visit  No.  Do you have questions or concerns about your Care? No.  Actions: * If pain score is 4 or above: No action needed, pain <4.  Have you developed a fever since your procedure? no  2.   Have you had an respiratory symptoms (SOB or cough) since your procedure? no  3.   Have you tested positive for COVID 19 since your procedure no  4.   Have you had any family members/close contacts diagnosed with the COVID 19 since your procedure?  no   If yes to any of these questions please route to Joylene John, RN and Joella Prince, RN

## 2021-03-28 ENCOUNTER — Encounter: Payer: Self-pay | Admitting: Family Medicine

## 2021-03-28 ENCOUNTER — Ambulatory Visit: Payer: Medicare PPO | Admitting: Family Medicine

## 2021-03-28 DIAGNOSIS — Z20822 Contact with and (suspected) exposure to covid-19: Secondary | ICD-10-CM | POA: Diagnosis not present

## 2021-03-28 NOTE — Progress Notes (Signed)
Virtual Visit via Telephone Note  I connected with Rachel Walton on 03/28/21 at 9:36 AM by telephone and verified that I am speaking with the correct person using two identifiers. Rachel Walton is currently located at home and her husband is currently with her during this visit. The provider, Loman Brooklyn, FNP is located in their office at time of visit.  I discussed the limitations, risks, security and privacy concerns of performing an evaluation and management service by telephone and the availability of in person appointments. I also discussed with the patient that there may be a patient responsible charge related to this service. The patient expressed understanding and agreed to proceed.  Subjective: PCP: Loman Brooklyn, FNP  Chief Complaint  Patient presents with   Suspected COVID   Patient complains of runny nose, scratchy throat and hoarseness.  Onset of symptoms was a few weeks ago, gradually improving since that time. She is drinking plenty of fluids. Evaluation to date: none. Treatment to date: nasal steroids. She does not smoke. Husband has been diagnosed with COVID.    ROS: Per HPI  Current Outpatient Medications:    acetaminophen (TYLENOL) 500 MG tablet, Take 500 mg by mouth every 6 (six) hours as needed for mild pain., Disp: , Rfl:    CALCIUM PO, Take by mouth daily., Disp: , Rfl:    Cholecalciferol (VITAMIN D) 50 MCG (2000 UT) CAPS, Take 2,000 Units by mouth daily., Disp: , Rfl:    famotidine (PEPCID) 20 MG tablet, Take 1 tablet (20 mg total) by mouth 2 (two) times daily., Disp: 90 tablet, Rfl: 1   fluticasone (FLONASE) 50 MCG/ACT nasal spray, Place 2 sprays into both nostrils daily., Disp: 16 g, Rfl: 6   lisinopril (ZESTRIL) 20 MG tablet, Take 1 tablet (20 mg total) by mouth daily., Disp: 90 tablet, Rfl: 1   mesalamine (LIALDA) 1.2 g EC tablet, Take 2 tablets (2.4 g total) by mouth daily with breakfast., Disp: 60 tablet, Rfl: 2   Multiple Vitamins-Minerals  (PRESERVISION AREDS PO), Take by mouth., Disp: , Rfl:    OVER THE COUNTER MEDICATION, Take 1 tablet by mouth daily. Stress B with Zinc, Disp: , Rfl:    potassium chloride (KLOR-CON) 10 MEQ tablet, TAKE ONE (1) TABLET EACH DAY, Disp: 90 tablet, Rfl: 0   vitamin B-12 (CYANOCOBALAMIN) 1000 MCG tablet, Take 1,000 mcg by mouth daily., Disp: , Rfl:    vitamin C (ASCORBIC ACID) 500 MG tablet, Take 500 mg by mouth daily., Disp: , Rfl:   Allergies  Allergen Reactions   Alendronate Sodium Other (See Comments)    Chest pain   Lexapro [Escitalopram Oxalate] Other (See Comments)    Makes lymphangitic colitis worse   Amoxicillin Other (See Comments)    diarrhea   Aspirin Other (See Comments)    Per the patient, made the gastric ulcer worse.   Clindamycin/Lincomycin Other (See Comments)    unknown   Keflex [Cephalexin] Other (See Comments)    unknown   Levofloxacin Other (See Comments)    Insomnia and headache   Prednisone Other (See Comments)    Nervousness   Sudafed [Pseudoephedrine Hcl] Other (See Comments)    insomnia   Sulfa Antibiotics Hives   Toprol Xl [Metoprolol Succinate] Other (See Comments)    Unknown reaction   Trimethoprim     Unknown reaction   Zithromax [Azithromycin] Rash   Past Medical History:  Diagnosis Date   Acute gastric ulcer    Allergy  Anxiety    Atrophic vaginitis    Barrett's esophagus    Basal cell carcinoma of nose     under nose   Cataract    Depression    Diverticulosis    Esophageal stricture    Esophageal ulcer    Fatty liver    Gall stones 06/15/2013   GERD (gastroesophageal reflux disease)    Heart murmur    Hiatal hernia    Hx of adenomatous colonic polyps    Hyperlipidemia    Hypertension    Hypokalemia    IBS (irritable bowel syndrome)    Kyphosis    Lymphocytic colitis    Macular degeneration    "?wet or dry; ? 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 Suspected COVID-19 virus infection Discussed symptom management.    Follow Up Instructions:  I discussed the assessment and treatment plan with the patient. The patient was provided an opportunity to ask questions and all were answered. The patient agreed with the plan and demonstrated an understanding of the instructions.   The patient was advised to call back or seek an in-person evaluation if the symptoms worsen or if the condition fails to improve as anticipated.  The above assessment and management plan was discussed with the patient. The patient verbalized understanding of and has agreed to the management plan. Patient is aware to call the clinic if symptoms persist or worsen. Patient is aware when to return to the clinic for a follow-up visit. Patient educated on when it is appropriate to go to the emergency department.   Time call ended: 9:47 AM  I provided 11 minutes of non-face-to-face time during this encounter.  Hendricks Limes, MSN, APRN, FNP-C [facility] 03/28/21

## 2021-03-31 ENCOUNTER — Ambulatory Visit: Payer: Medicare PPO

## 2021-04-02 ENCOUNTER — Ambulatory Visit: Payer: Medicare PPO | Admitting: Family Medicine

## 2021-04-02 ENCOUNTER — Other Ambulatory Visit: Payer: Self-pay

## 2021-04-02 ENCOUNTER — Encounter: Payer: Self-pay | Admitting: Family Medicine

## 2021-04-02 DIAGNOSIS — Z20822 Contact with and (suspected) exposure to covid-19: Secondary | ICD-10-CM

## 2021-04-02 MED ORDER — ALBUTEROL SULFATE HFA 108 (90 BASE) MCG/ACT IN AERS: 2.0000 | INHALATION_SPRAY | Freq: Four times a day (QID) | RESPIRATORY_TRACT | 0 refills | Status: DC | PRN

## 2021-04-02 NOTE — Progress Notes (Signed)
Virtual Visit via Telephone Note  I connected with Rachel Walton on 04/02/21 at 10:11 AM by telephone and verified that I am speaking with the correct person using two identifiers. Rachel Walton is currently located at home and her husband is currently with her during this visit. The provider, Loman Brooklyn, FNP is located in their office at time of visit.  I discussed the limitations, risks, security and privacy concerns of performing an evaluation and management service by telephone and the availability of in person appointments. I also discussed with the patient that there may be a patient responsible charge related to this service. The patient expressed understanding and agreed to proceed.  Subjective: PCP: Loman Brooklyn, FNP  Chief Complaint  Patient presents with   Covid Positive   Patient complains of  runny nose, watery eyes, weakness, and chest tightness . She had a visit last week; it was suspected at that time that she had COVID since her husband had just been diagnosed with COVID. However, her symptoms had been going on longer than 5 days so she was not treated with antivirals. She has been taking Robitussin and Tylenol. She would like to officially be tested for COVID even though she cannot receive antiviral treatment.    ROS: Per HPI  Current Outpatient Medications:    acetaminophen (TYLENOL) 500 MG tablet, Take 500 mg by mouth every 6 (six) hours as needed for mild pain., Disp: , Rfl:    CALCIUM PO, Take by mouth daily., Disp: , Rfl:    Cholecalciferol (VITAMIN D) 50 MCG (2000 UT) CAPS, Take 2,000 Units by mouth daily., Disp: , Rfl:    famotidine (PEPCID) 20 MG tablet, Take 1 tablet (20 mg total) by mouth 2 (two) times daily., Disp: 90 tablet, Rfl: 1   fluticasone (FLONASE) 50 MCG/ACT nasal spray, Place 2 sprays into both nostrils daily., Disp: 16 g, Rfl: 6   lisinopril (ZESTRIL) 20 MG tablet, Take 1 tablet (20 mg total) by mouth daily., Disp: 90 tablet, Rfl: 1    mesalamine (LIALDA) 1.2 g EC tablet, Take 2 tablets (2.4 g total) by mouth daily with breakfast., Disp: 60 tablet, Rfl: 2   Multiple Vitamins-Minerals (PRESERVISION AREDS PO), Take by mouth., Disp: , Rfl:    OVER THE COUNTER MEDICATION, Take 1 tablet by mouth daily. Stress B with Zinc, Disp: , Rfl:    potassium chloride (KLOR-CON) 10 MEQ tablet, TAKE ONE (1) TABLET EACH DAY, Disp: 90 tablet, Rfl: 0   vitamin B-12 (CYANOCOBALAMIN) 1000 MCG tablet, Take 1,000 mcg by mouth daily., Disp: , Rfl:    vitamin C (ASCORBIC ACID) 500 MG tablet, Take 500 mg by mouth daily., Disp: , Rfl:   Allergies  Allergen Reactions   Alendronate Sodium Other (See Comments)    Chest pain   Lexapro [Escitalopram Oxalate] Other (See Comments)    Makes lymphangitic colitis worse   Amoxicillin Other (See Comments)    diarrhea   Aspirin Other (See Comments)    Per the patient, made the gastric ulcer worse.   Clindamycin/Lincomycin Other (See Comments)    unknown   Keflex [Cephalexin] Other (See Comments)    unknown   Levofloxacin Other (See Comments)    Insomnia and headache   Prednisone Other (See Comments)    Nervousness   Sudafed [Pseudoephedrine Hcl] Other (See Comments)    insomnia   Sulfa Antibiotics Hives   Toprol Xl [Metoprolol Succinate] Other (See Comments)    Unknown reaction  Trimethoprim     Unknown reaction   Zithromax [Azithromycin] Rash   Past Medical History:  Diagnosis Date   Acute gastric ulcer    Allergy    Anxiety    Atrophic vaginitis    Barrett's esophagus    Basal cell carcinoma of nose     under nose   Cataract    Depression    Diverticulosis    Esophageal stricture    Esophageal ulcer    Fatty liver    Gall stones 06/15/2013   GERD (gastroesophageal reflux disease)    Heart murmur    Hiatal hernia    Hx of adenomatous colonic polyps    Hyperlipidemia    Hypertension    Hypokalemia    IBS (irritable bowel syndrome)    Kyphosis    Lymphocytic colitis    Macular  degeneration    "?wet or dry; ? 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 Suspected COVID-19 virus infection Continue symptom management. Discussed appropriate use of an Albuterol inhaler for the tightness in her chest.  - COVID-19, Flu A+B and RSV; Future - albuterol (VENTOLIN HFA) 108 (90 Base) MCG/ACT inhaler; Inhale 2 puffs into the lungs every 6 (six) hours as needed.  Dispense: 18 g; Refill: 0   Follow Up Instructions:  I discussed the assessment and treatment plan with the patient. The patient was provided an opportunity to ask questions and all were answered. The patient agreed with the plan and demonstrated an understanding of the instructions.   The patient was advised to call back or seek an in-person evaluation if the symptoms worsen or if the condition fails to improve as anticipated.  The above assessment and management plan was discussed with the patient. The patient verbalized understanding of and has agreed to the management plan. Patient is aware to call the clinic if symptoms persist or worsen. Patient is aware when to return to the clinic for a follow-up visit. Patient educated on when it is appropriate to go to the emergency department.   Time call ended: 10:23 AM  I provided 12 minutes of non-face-to-face time during this encounter.  Hendricks Limes, MSN, APRN, FNP-C [facility] 04/02/21

## 2021-04-03 ENCOUNTER — Encounter: Payer: Self-pay | Admitting: Family Medicine

## 2021-04-03 LAB — COVID-19, FLU A+B AND RSV
Influenza A, NAA: NOT DETECTED
Influenza B, NAA: NOT DETECTED
RSV, NAA: NOT DETECTED
SARS-CoV-2, NAA: DETECTED — AB

## 2021-04-25 ENCOUNTER — Other Ambulatory Visit: Payer: Self-pay | Admitting: Internal Medicine

## 2021-04-29 ENCOUNTER — Telehealth: Payer: Self-pay | Admitting: Family Medicine

## 2021-04-29 NOTE — Telephone Encounter (Signed)
Pt last seen dr Jerilee Hoh 06-19-2020. Pt has new provider and its not happy with current provider and would like to know if dr Jerilee Hoh would resume her care

## 2021-05-14 ENCOUNTER — Telehealth: Payer: Self-pay | Admitting: Internal Medicine

## 2021-05-14 NOTE — Telephone Encounter (Signed)
Patient called would like to ask Dr. Hilarie Fredrickson if he can let her know about a PCP doctor that she can see.

## 2021-05-15 NOTE — Telephone Encounter (Signed)
I see that she has an appointment with Dr. Carlota Raspberry, which I believe would be a new PCP for her I recommend she keep this appointment, let me know if she has questions

## 2021-06-04 ENCOUNTER — Telehealth: Payer: Self-pay | Admitting: Family Medicine

## 2021-06-04 ENCOUNTER — Encounter: Payer: Self-pay | Admitting: Nurse Practitioner

## 2021-06-04 ENCOUNTER — Ambulatory Visit: Payer: Medicare PPO | Admitting: Nurse Practitioner

## 2021-06-04 VITALS — BP 144/78 | HR 74 | Temp 98.0°F | Resp 20 | Ht 61.0 in | Wt 119.0 lb

## 2021-06-04 DIAGNOSIS — R251 Tremor, unspecified: Secondary | ICD-10-CM | POA: Diagnosis not present

## 2021-06-04 MED ORDER — TOPIRAMATE 25 MG PO TABS: 25.0000 mg | ORAL_TABLET | Freq: Two times a day (BID) | ORAL | 2 refills | Status: DC

## 2021-06-04 NOTE — Telephone Encounter (Signed)
I do not mind if MMM is accepting. For what it's worth. Patient was not on clonazepam when she established care with me as she had previously weaned herself off, so it was removed from her medication list that day. At a later visit she mentioned she needed something for anxiety since she was not taking clonazepam any longer.

## 2021-06-04 NOTE — Patient Instructions (Signed)
Tremor A tremor is trembling or shaking that you cannot control. Most tremors affect the hands or arms. Tremors can also affect the head, vocal cords, face, and other parts of the body. There are many types of tremors. Common types include: Essential tremor. These usually occur in people older than 40. It may run in families and can happen in otherwise healthy people. Resting tremor. These occur when the muscles are at rest, such as when your hands are resting in your lap. People with Parkinson's disease often have resting tremors. Postural tremor. These occur when you try to hold a pose, such as keeping your hands outstretched. Kinetic tremor. These occur during purposeful movement, such as trying to touch a finger to your nose. Task-specific tremor. These may occur when you perform certain tasks such as writing, speaking, or standing. Psychogenic tremor. These dramatically lessen or disappear when you are distracted. They can happen in people of all ages. Some types of tremors have no known cause. Tremors can also be a symptom of nervous system problems (neurological disorders) that may occur with aging. Some tremors go away with treatment, while others do not. Follow these instructions at home: Lifestyle   Limit alcohol intake to no more than 1 drink a day for nonpregnant women and 2 drinks a day for men. One drink equals 12 oz of beer, 5 oz of wine, or 1 oz of hard liquor. Do not use any products that contain nicotine or tobacco, such as cigarettes and e-cigarettes. If you need help quitting, ask your health care provider. Avoid extreme heat and extreme cold. Limit your caffeine intake, as told by your health care provider. Try to get 8 hours of sleep each night. Find ways to manage your stress, such as meditation or yoga. General instructions Take over-the-counter and prescription medicines only as told by your health care provider. Keep all follow-up visits as told by your health care  provider. This is important. Contact a health care provider if you: Develop a tremor after starting a new medicine. Have a tremor along with other symptoms such as: Numbness. Tingling. Pain. Weakness. Notice that your tremor gets worse. Notice that your tremor interferes with your day-to-day life. Summary A tremor is trembling or shaking that you cannot control. Most tremors affect the hands or arms. Some types of tremors have no known cause. Others may be a symptom of nervous system problems (neurological disorders). Make sure you discuss any tremors you have with your health care provider. This information is not intended to replace advice given to you by your health care provider. Make sure you discuss any questions you have with your health care provider. Document Revised: 01/17/2020 Document Reviewed: 01/19/2020 Elsevier Patient Education  2022 [employer].

## 2021-06-04 NOTE — Progress Notes (Signed)
Subjective:    Patient ID: MEI SUITS, female    DOB: 05-06-1930, 88 y.o.   MRN: [id number]   Chief Complaint: Right ear feels stopped up and dizziness with standing (Wants to have a covid test/)   HPI Patient comes in today with 2 complaints: - patient has tremors and she started on clonazepam by neurology and that worked well for her. She has been off of it for 2 years. Her current PCP says that she will not prescribe it for her because it causes dementia. Patient is very upset and wants clonazepam back.  - feels tired- afraid she may have covid- has no symptoms of covid. Other then fatigue.   Review of Systems  Constitutional:  Negative for diaphoresis.  Eyes:  Negative for pain.  Respiratory:  Negative for shortness of breath.   Cardiovascular:  Negative for chest pain, palpitations and leg swelling.  Gastrointestinal:  Negative for abdominal pain.  Endocrine: Negative for polydipsia.  Skin:  Negative for rash.  Neurological:  Negative for dizziness, weakness and headaches.  Hematological:  Does not bruise/bleed easily.  All other systems reviewed and are negative.     Objective:   Physical Exam Vitals and nursing note reviewed.  Constitutional:      General: She is not in acute distress.    Appearance: Normal appearance. She is well-developed.  Neck:     Vascular: No carotid bruit or JVD.  Cardiovascular:     Rate and Rhythm: Normal rate and regular rhythm.     Heart sounds: Normal heart sounds.  Pulmonary:     Effort: Pulmonary effort is normal. No respiratory distress.     Breath sounds: Normal breath sounds. No wheezing or rales.  Chest:     Chest wall: No tenderness.  Abdominal:     General: Bowel sounds are normal. There is no distension or abdominal bruit.     Palpations: Abdomen is soft. There is no hepatomegaly, splenomegaly, mass or pulsatile mass.     Tenderness: There is no abdominal tenderness.  Musculoskeletal:        General: Normal range of  motion.     Cervical back: Normal range of motion and neck supple.  Lymphadenopathy:     Cervical: No cervical adenopathy.  Skin:    General: Skin is warm and dry.  Neurological:     General: No focal deficit present.     Mental Status: She is alert and oriented to person, place, and time.     Cranial Nerves: No cranial nerve deficit.     Sensory: No sensory deficit.     Deep Tendon Reflexes: Reflexes are normal and symmetric.     Comments: head tremor when she talks  Psychiatric:        Behavior: Behavior normal.        Thought Content: Thought content normal.        Judgment: Judgment normal.    BP (!) 144/78    Pulse 74    Temp 98 F (36.7 C) (Temporal)    Resp 20    Ht 5\' 1"  (1.549 m)    Wt 119 lb (54 kg)    SpO2 99%    BMI 22.48 kg/m          Assessment & Plan:  [name] in today with chief complaint of Right ear feels stopped up and dizziness with standing (Wants to have a covid test/)   1. Tremors of nervous system Allergic to propranolol  Do not want to start clonazepam back if can help it If topamax does not help will send back to neurology - topiramate (TOPAMAX) 25 MG tablet; Take 1 tablet (25 mg total) by mouth 2 (two) times daily.  Dispense: 60 tablet; Refill: 2  Consulted with DR. Dettinger  The above assessment and management plan was discussed with the patient. The patient verbalized understanding of and has agreed to the management plan. Patient is aware to call the clinic if symptoms persist or worsen. Patient is aware when to return to the clinic for a follow-up visit. Patient educated on when it is appropriate to go to the emergency department.   Mary-Margaret Hassell Done, FNP

## 2021-06-05 NOTE — Telephone Encounter (Signed)
Ok to change

## 2021-06-05 NOTE — Telephone Encounter (Signed)
Patient aware and verbalized understanding. Pcp changed she will call back to make appt

## 2021-06-13 ENCOUNTER — Other Ambulatory Visit: Payer: Self-pay | Admitting: Internal Medicine

## 2021-06-13 DIAGNOSIS — Z8719 Personal history of other diseases of the digestive system: Secondary | ICD-10-CM

## 2021-06-13 DIAGNOSIS — R079 Chest pain, unspecified: Secondary | ICD-10-CM

## 2021-06-21 ENCOUNTER — Other Ambulatory Visit: Payer: Self-pay | Admitting: Family Medicine

## 2021-06-21 DIAGNOSIS — I10 Essential (primary) hypertension: Secondary | ICD-10-CM

## 2021-06-21 DIAGNOSIS — E876 Hypokalemia: Secondary | ICD-10-CM

## 2021-06-23 NOTE — Telephone Encounter (Signed)
Britney. NTBS in March for 6 mos ckup. Refill sent

## 2021-06-23 NOTE — Telephone Encounter (Signed)
Apt scheduled 07/18/2021 with MMM

## 2021-07-18 ENCOUNTER — Encounter: Payer: Self-pay | Admitting: Nurse Practitioner

## 2021-07-18 ENCOUNTER — Ambulatory Visit: Payer: Medicare PPO | Admitting: Nurse Practitioner

## 2021-07-18 VITALS — BP 134/68 | HR 74 | Temp 97.2°F | Ht 61.0 in | Wt 118.6 lb

## 2021-07-18 DIAGNOSIS — G250 Essential tremor: Secondary | ICD-10-CM | POA: Diagnosis not present

## 2021-07-18 DIAGNOSIS — R079 Chest pain, unspecified: Secondary | ICD-10-CM

## 2021-07-18 DIAGNOSIS — E559 Vitamin D deficiency, unspecified: Secondary | ICD-10-CM | POA: Diagnosis not present

## 2021-07-18 DIAGNOSIS — K5790 Diverticulosis of intestine, part unspecified, without perforation or abscess without bleeding: Secondary | ICD-10-CM

## 2021-07-18 DIAGNOSIS — I10 Essential (primary) hypertension: Secondary | ICD-10-CM

## 2021-07-18 DIAGNOSIS — Z8719 Personal history of other diseases of the digestive system: Secondary | ICD-10-CM

## 2021-07-18 DIAGNOSIS — K2270 Barrett's esophagus without dysplasia: Secondary | ICD-10-CM

## 2021-07-18 DIAGNOSIS — E782 Mixed hyperlipidemia: Secondary | ICD-10-CM

## 2021-07-18 DIAGNOSIS — R413 Other amnesia: Secondary | ICD-10-CM | POA: Diagnosis not present

## 2021-07-18 DIAGNOSIS — F419 Anxiety disorder, unspecified: Secondary | ICD-10-CM | POA: Diagnosis not present

## 2021-07-18 DIAGNOSIS — E876 Hypokalemia: Secondary | ICD-10-CM

## 2021-07-18 DIAGNOSIS — R251 Tremor, unspecified: Secondary | ICD-10-CM

## 2021-07-18 MED ORDER — MESALAMINE 1.2 G PO TBEC: DELAYED_RELEASE_TABLET | ORAL | 2 refills | Status: DC

## 2021-07-18 MED ORDER — LISINOPRIL 20 MG PO TABS: 20.0000 mg | ORAL_TABLET | Freq: Every day | ORAL | 1 refills | Status: DC

## 2021-07-18 MED ORDER — POTASSIUM CHLORIDE ER 10 MEQ PO TBCR: 10.0000 meq | EXTENDED_RELEASE_TABLET | Freq: Every day | ORAL | 1 refills | Status: DC

## 2021-07-18 MED ORDER — FAMOTIDINE 20 MG PO TABS: 20.0000 mg | ORAL_TABLET | Freq: Two times a day (BID) | ORAL | 0 refills | Status: DC | PRN

## 2021-07-18 MED ORDER — TOPIRAMATE 25 MG PO TABS: 25.0000 mg | ORAL_TABLET | Freq: Two times a day (BID) | ORAL | 2 refills | Status: DC

## 2021-07-18 MED ORDER — CLONAZEPAM 0.5 MG PO TABS: ORAL_TABLET | ORAL | 1 refills | Status: DC

## 2021-07-18 NOTE — Progress Notes (Signed)
Subjective:    Patient ID: Rachel Walton, female    DOB: 20-Dec-1929, 88 y.o.   MRN: [id number]   Chief Complaint: medical management of chronic issues     HPI:  Rachel Walton is a 88 y.o. who identifies as a female who was assigned female at birth.   Social history: Lives with: husband Work history: retired   Scientist, forensic in today for follow up of the following chronic medical issues:  1. Essential hypertension No c/o chest pain, sob or headache. Does not check blood pressure at home. BP Readings from Last 3 Encounters:  06/04/21 (!) 144/78  03/21/21 124/66  03/07/21 (!) 180/63     2. Mixed hyperlipidemia Does not watch diet and does no exercise. Lab Results  Component Value Date   CHOL 279 (H) 12/04/2020   HDL 98 12/04/2020   LDLCALC 151 (H) 12/04/2020   TRIG 172 (H) 12/04/2020   CHOLHDL 2.8 12/04/2020     3. Hypokalemia Denies any lower ext cramping Lab Results  Component Value Date   K 4.2 03/18/2021     4. Diverticulosis No recent flare ups  5. Barrett's esophagus without dysplasia Uses pepcid daily and is doing well.  6. Essential tremor No worse. Is on tppamax and that seems to help. At last visit she wanted klonopin but we did not want to do that if we can help it. She is still insisting on klonopin  7. Anxiety Is on no prescription meds. 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 She was on klonopin for years and it was stopped by B. Joyce,NP. Sh eonly took a tablet 1-2 x a week. GAD 7 : Generalized Anxiety Score 01/15/2021 12/04/2020  Nervous, Anxious, on Edge 1 0  Control/stop worrying 1 1  Worry too much - different things 0 0  Trouble relaxing 1 0  Restless 0 0  Easily annoyed or irritable 1 0  Afraid - awful might happen 0 0  Total GAD 7 Score 4 1  Anxiety Difficulty Somewhat difficult Somewhat difficult      8. Memory difficulties No changes  9. Vitamin D deficiency Is on daily vitamin d supplement Last vitamin D Lab Results  Component Value  Date   VD25OH 43.2 12/04/2020      New complaints: None today  Allergies  Allergen Reactions   Alendronate Sodium Other (See Comments)    Chest pain   Lexapro [Escitalopram Oxalate] Other (See Comments)    Makes lymphangitic colitis worse   Amoxicillin Other (See Comments)    diarrhea   Aspirin Other (See Comments)    Per the patient, made the gastric ulcer worse.   Clindamycin/Lincomycin Other (See Comments)    unknown   Keflex [Cephalexin] Other (See Comments)    unknown   Levofloxacin Other (See Comments)    Insomnia and headache   Prednisone Other (See Comments)    Nervousness   Sudafed [Pseudoephedrine Hcl] Other (See Comments)    insomnia   Sulfa Antibiotics Hives   Toprol Xl [Metoprolol Succinate] Other (See Comments)    Unknown reaction   Trimethoprim     Unknown reaction   Zithromax [Azithromycin] Rash   Outpatient Encounter Medications as of 07/18/2021  Medication Sig   acetaminophen (TYLENOL) 500 MG tablet Take 500 mg by mouth every 6 (six) hours as needed for mild pain.   albuterol (VENTOLIN HFA) 108 (90 Base) MCG/ACT inhaler Inhale 2 puffs into the lungs every 6 (six) hours as needed.   CALCIUM PO Take  by mouth daily.   Cholecalciferol (VITAMIN D) 50 MCG (2000 UT) CAPS Take 2,000 Units by mouth daily.   famotidine (PEPCID) 20 MG tablet Take 1 tablet (20 mg total) by mouth 2 (two) times daily as needed.   fluticasone (FLONASE) 50 MCG/ACT nasal spray Place 2 sprays into both nostrils daily.   lisinopril (ZESTRIL) 20 MG tablet TAKE ONE (1) TABLET EACH DAY   mesalamine (LIALDA) 1.2 g EC tablet TAKE 2 TABLETS BY MOUTH EVERY MORNING WITH BREAKFAST   Multiple Vitamins-Minerals (PRESERVISION AREDS PO) Take by mouth.   OVER THE COUNTER MEDICATION Take 1 tablet by mouth daily. Stress B with Zinc   potassium chloride (KLOR-CON) 10 MEQ tablet TAKE ONE (1) TABLET EACH DAY   topiramate (TOPAMAX) 25 MG tablet Take 1 tablet (25 mg total) by mouth 2 (two) times daily.    vitamin B-12 (CYANOCOBALAMIN) 1000 MCG tablet Take 1,000 mcg by mouth daily.   vitamin C (ASCORBIC ACID) 500 MG tablet Take 500 mg by mouth daily.   No facility-administered encounter medications on file as of 07/18/2021.    Past Surgical History:  Procedure Laterality Date   ABDOMINAL HYSTERECTOMY  1980   APPENDECTOMY     BACK SURGERY     BREAST BIOPSY Left    CARPAL TUNNEL RELEASE Left 09/13/2020   Procedure: CARPAL TUNNEL RELEASE;  Surgeon: Roseanne Kaufman, MD;  Location: [facility];  Service: Orthopedics;  Laterality: Left;   CATARACT EXTRACTION W/ INTRAOCULAR LENS  IMPLANT, BILATERAL Bilateral    COLONOSCOPY     ESOPHAGOGASTRODUODENOSCOPY (EGD) WITH ESOPHAGEAL DILATION  X 1   EYE SURGERY     LUMBAR DISC SURGERY     "fragmented disc"   ORIF RADIAL FRACTURE Left 09/13/2020   Procedure: HARDWARE REMOVAL, OPEN REDUCTION INTERNAL FIXATION LEFT RADIUS;  Surgeon: Roseanne Kaufman, MD;  Location: [facility];  Service: Orthopedics;  Laterality: Left;  Block with IV sedation   POLYPECTOMY     SQUAMOUS CELL CARCINOMA EXCISION     nose tip    Family History  Problem Relation Age of Onset   Stroke Mother    Heart disease Father        MI   Heart attack Father    Cancer Sister        Brain tumor   Hypertension Sister    Osteoporosis Sister    Hypertension Sister    Breast cancer Sister 24   Hypertension Sister    Osteoporosis Sister    Hypertension Sister    Hypertension Sister    Stroke Sister    Hypertension Sister    Hypertension Sister    Colon cancer Brother    Heart disease Brother    Diabetes Son 82   Diabetes Son 70   Brain cancer Son        deceased   Cancer Son        brain   Diabetes Maternal Uncle    Colon cancer Other        nephew   Thyroid cancer Other        niece   Breast cancer Other    Breast cancer Other    Breast cancer Other    Colon polyps Neg Hx    Esophageal cancer Neg Hx    Rectal cancer Neg Hx    Stomach cancer Neg Hx       Controlled  substance contract: 07/18/21     Review of Systems  Constitutional:  Negative for diaphoresis.  Eyes:  Negative for pain.  Respiratory:  Negative for shortness of breath.   Cardiovascular:  Negative for chest pain, palpitations and leg swelling.  Gastrointestinal:  Negative for abdominal pain.  Endocrine: Negative for polydipsia.  Skin:  Negative for rash.  Neurological:  Negative for dizziness, weakness and headaches.  Hematological:  Does not bruise/bleed easily.  All other systems reviewed and are negative.     Objective:   Physical Exam Vitals and nursing note reviewed.  Constitutional:      General: She is not in acute distress.    Appearance: Normal appearance. She is well-developed.  HENT:     Head: Normocephalic.     Right Ear: Tympanic membrane normal.     Left Ear: Tympanic membrane normal.     Nose: Nose normal.     Mouth/Throat:     Mouth: Mucous membranes are moist.  Eyes:     Pupils: Pupils are equal, round, and reactive to light.  Neck:     Vascular: No carotid bruit or JVD.  Cardiovascular:     Rate and Rhythm: Normal rate and regular rhythm.     Heart sounds: Normal heart sounds.  Pulmonary:     Effort: Pulmonary effort is normal. No respiratory distress.     Breath sounds: Normal breath sounds. No wheezing or rales.  Chest:     Chest wall: No tenderness.  Abdominal:     General: Bowel sounds are normal. There is no distension or abdominal bruit.     Palpations: Abdomen is soft. There is no hepatomegaly, splenomegaly, mass or pulsatile mass.     Tenderness: There is no abdominal tenderness.  Musculoskeletal:        General: Normal range of motion.     Cervical back: Normal range of motion and neck supple.  Lymphadenopathy:     Cervical: No cervical adenopathy.  Skin:    General: Skin is warm and dry.  Neurological:     Mental Status: She is alert and oriented to person, place, and time.     Deep Tendon Reflexes: Reflexes are normal and  symmetric.  Psychiatric:        Behavior: Behavior normal.        Thought Content: Thought content normal.        Judgment: Judgment normal.   BP 134/68    Pulse 74    Temp (!) 97.2 F (36.2 C) (Temporal)    Ht 5' 1" (1.549 m)    Wt 118 lb 9.6 oz (53.8 kg)    BMI 22.41 kg/m          Assessment & Plan:  Rachel Walton comes in today with chief complaint of Medication Management (Medication refill, voice is hoarse)   Diagnosis and orders addressed:  1. Essential hypertension Low sodium diet - lisinopril (ZESTRIL) 20 MG tablet; Take 1 tablet (20 mg total) by mouth daily.  Dispense: 90 tablet; Refill: 1 - CBC with Differential/Platelet - CMP14+EGFR  2. Mixed hyperlipidemia Low fat diet - Lipid panel  3. Hypokalemia Labs ped=nding  4. Diverticulosis Watch diet to prevent flare up  5. Barrett's esophagus without dysplasia  6. Essential tremor  7. Anxiety Only take klonopin as needed - clonazePAM (KLONOPIN) 0.5 MG tablet; 1/2 to 1 po prn  Dispense: 30 tablet; Refill: 1 - ToxASSURE Select 13 (MW), Urine  8. Memory difficulties Read daily, word puxzzles- to keep brain active  9. Vitamin D deficiency Continue dialy vitamin d supplement  10. Chest pain, unspecified type -  famotidine (PEPCID) 20 MG tablet; Take 1 tablet (20 mg total) by mouth 2 (two) times daily as needed.  Dispense: 180 tablet; Refill: 0  11. History of Barrett's esophagus - famotidine (PEPCID) 20 MG tablet; Take 1 tablet (20 mg total) by mouth 2 (two) times daily as needed.  Dispense: 180 tablet; Refill: 0  12. History of gastroesophageal reflux (GERD)  - famotidine (PEPCID) 20 MG tablet; Take 1 tablet (20 mg total) by mouth 2 (two) times daily as needed.  Dispense: 180 tablet; Refill: 0  13. Low serum potassium level - potassium chloride (KLOR-CON) 10 MEQ tablet; Take 1 tablet (10 mEq total) by mouth daily.  Dispense: 90 tablet; Refill: 1  14. Tremors of nervous system  - topiramate (TOPAMAX)  25 MG tablet; Take 1 tablet (25 mg total) by mouth 2 (two) times daily.  Dispense: 60 tablet; Refill: 2   Labs pending Health Maintenance reviewed Diet and exercise encouraged  Follow up plan: 3 months   Mary-Margaret Hassell Done, FNP

## 2021-07-18 NOTE — Patient Instructions (Signed)

## 2021-07-19 LAB — CBC WITH DIFFERENTIAL/PLATELET
Basophils Absolute: 0.1 10*3/uL (ref 0.0–0.2)
Basos: 1 %
EOS (ABSOLUTE): 0.2 10*3/uL (ref 0.0–0.4)
Eos: 3 %
Hematocrit: 39.9 % (ref 34.0–46.6)
Hemoglobin: 13.2 g/dL (ref 11.1–15.9)
Immature Grans (Abs): 0 10*3/uL (ref 0.0–0.1)
Immature Granulocytes: 0 %
Lymphocytes Absolute: 2.7 10*3/uL (ref 0.7–3.1)
Lymphs: 39 %
MCH: 29.1 pg (ref 26.6–33.0)
MCHC: 33.1 g/dL (ref 31.5–35.7)
MCV: 88 fL (ref 79–97)
Monocytes Absolute: 0.6 10*3/uL (ref 0.1–0.9)
Monocytes: 8 %
Neutrophils Absolute: 3.4 10*3/uL (ref 1.4–7.0)
Neutrophils: 49 %
Platelets: 313 10*3/uL (ref 150–450)
RBC: 4.53 x10E6/uL (ref 3.77–5.28)
RDW: 12.3 % (ref 11.7–15.4)
WBC: 6.9 10*3/uL (ref 3.4–10.8)

## 2021-07-19 LAB — CMP14+EGFR
ALT: 10 IU/L (ref 0–32)
AST: 18 IU/L (ref 0–40)
Albumin/Globulin Ratio: 1.8 (ref 1.2–2.2)
Albumin: 4.3 g/dL (ref 3.6–4.6)
Alkaline Phosphatase: 65 IU/L (ref 44–121)
BUN/Creatinine Ratio: 11 — ABNORMAL LOW (ref 12–28)
BUN: 9 mg/dL (ref 8–27)
Bilirubin Total: 0.3 mg/dL (ref 0.0–1.2)
CO2: 24 mmol/L (ref 20–29)
Calcium: 9.8 mg/dL (ref 8.7–10.3)
Chloride: 99 mmol/L (ref 96–106)
Creatinine, Ser: 0.85 mg/dL (ref 0.57–1.00)
Globulin, Total: 2.4 g/dL (ref 1.5–4.5)
Glucose: 98 mg/dL (ref 70–99)
Potassium: 4.7 mmol/L (ref 3.5–5.2)
Sodium: 138 mmol/L (ref 134–144)
Total Protein: 6.7 g/dL (ref 6.0–8.5)
eGFR: 65 mL/min/{1.73_m2} (ref >59)

## 2021-07-19 LAB — LIPID PANEL
Chol/HDL Ratio: 2.4 ratio (ref 0.0–4.4)
Cholesterol, Total: 282 mg/dL — ABNORMAL HIGH (ref 100–199)
HDL: 120 mg/dL (ref >39)
LDL Chol Calc (NIH): 138 mg/dL — ABNORMAL HIGH (ref 0–99)
Triglycerides: 146 mg/dL (ref 0–149)
VLDL Cholesterol Cal: 24 mg/dL (ref 5–40)

## 2021-07-24 ENCOUNTER — Other Ambulatory Visit: Payer: Self-pay | Admitting: Nurse Practitioner

## 2021-07-24 LAB — TOXASSURE SELECT 13 (MW), URINE

## 2021-08-04 ENCOUNTER — Ambulatory Visit: Payer: Medicare PPO | Admitting: Family Medicine

## 2021-10-20 ENCOUNTER — Encounter: Payer: Self-pay | Admitting: Nurse Practitioner

## 2021-10-20 ENCOUNTER — Ambulatory Visit: Payer: Medicare PPO | Admitting: Nurse Practitioner

## 2021-10-20 VITALS — BP 157/67 | Temp 98.6°F | Resp 20 | Ht 61.0 in | Wt 126.0 lb

## 2021-10-20 DIAGNOSIS — I10 Essential (primary) hypertension: Secondary | ICD-10-CM

## 2021-10-20 DIAGNOSIS — Z23 Encounter for immunization: Secondary | ICD-10-CM

## 2021-10-20 DIAGNOSIS — K2270 Barrett's esophagus without dysplasia: Secondary | ICD-10-CM | POA: Diagnosis not present

## 2021-10-20 DIAGNOSIS — R490 Dysphonia: Secondary | ICD-10-CM

## 2021-10-20 DIAGNOSIS — G250 Essential tremor: Secondary | ICD-10-CM

## 2021-10-20 DIAGNOSIS — E782 Mixed hyperlipidemia: Secondary | ICD-10-CM

## 2021-10-20 DIAGNOSIS — E559 Vitamin D deficiency, unspecified: Secondary | ICD-10-CM

## 2021-10-20 DIAGNOSIS — F419 Anxiety disorder, unspecified: Secondary | ICD-10-CM

## 2021-10-20 DIAGNOSIS — E876 Hypokalemia: Secondary | ICD-10-CM

## 2021-10-20 DIAGNOSIS — K5790 Diverticulosis of intestine, part unspecified, without perforation or abscess without bleeding: Secondary | ICD-10-CM | POA: Diagnosis not present

## 2021-10-20 DIAGNOSIS — R413 Other amnesia: Secondary | ICD-10-CM | POA: Diagnosis not present

## 2021-10-20 MED ORDER — FAMOTIDINE 20 MG PO TABS: 20.0000 mg | ORAL_TABLET | Freq: Two times a day (BID) | ORAL | 1 refills | Status: DC | PRN

## 2021-10-20 MED ORDER — TOPIRAMATE 25 MG PO TABS: 25.0000 mg | ORAL_TABLET | Freq: Two times a day (BID) | ORAL | 2 refills | Status: DC

## 2021-10-20 MED ORDER — POTASSIUM CHLORIDE ER 10 MEQ PO TBCR: 10.0000 meq | EXTENDED_RELEASE_TABLET | Freq: Every day | ORAL | 1 refills | Status: DC

## 2021-10-20 MED ORDER — LISINOPRIL 20 MG PO TABS: 20.0000 mg | ORAL_TABLET | Freq: Every day | ORAL | 1 refills | Status: DC

## 2021-10-20 NOTE — Addendum Note (Signed)
Addended by: Rolena Infante on: 10/20/2021 12:41 PM   Modules accepted: Orders

## 2021-10-20 NOTE — Progress Notes (Signed)
Subjective:    Patient ID: Rachel Walton, female    DOB: Sep 04, 1929, 88 y.o.   MRN: [id number]   Chief Complaint: medical management of chronic issues     HPI:  Rachel Walton is a 88 y.o. who identifies as a female who was assigned female at birth.   Social history: Lives with: husband Work history: retired   Scientist, forensic in today for follow up of the following chronic medical issues:  1. Essential hypertension No c/o chest pain, sob or headache. Does not check blood pressure at home. BP Readings from Last 3 Encounters:  07/18/21 134/68  06/04/21 (!) 144/78  03/21/21 124/66     2. Mixed hyperlipidemia Does try to watch diet and stays very active. Lab Results  Component Value Date   CHOL 282 (H) 07/18/2021   HDL 120 07/18/2021   LDLCALC 138 (H) 07/18/2021   TRIG 146 07/18/2021   CHOLHDL 2.4 07/18/2021     3. Hypokalemia No c/o of muscle cramps. Lab Results  Component Value Date   K 4.7 07/18/2021     4. Diverticulosis Denies any recent flare ups  5. Barrett's esophagus without dysplasia Is on pepcid daily and that works well for any heart burn symptoms  6. Essential tremor Has tremor of head. Is no better and no worse. Is currently not see neurology for this  7. Memory difficulties Patient says she does not feel that she is any worse.  8. Anxiety Is back on her klonopin. Another provider in office stopped hr klonopin and she really struggled. We put her back on it and she is doing much better.     10/20/2021   12:07 PM 07/18/2021    1:47 PM 01/15/2021    1:41 PM 12/04/2020    2:08 PM  GAD 7 : Generalized Anxiety Score  Nervous, Anxious, on Edge 0 1 1 0  Control/stop worrying 0 _0 Worry too much - different things 0 1 0 0  Trouble relaxing 0 1 1 0  Restless 0 1 0 0  Easily annoyed or irritable 0 1 1 0  Afraid - awful might happen 0 1 0 0  Total GAD 7 Score 0 _1 Anxiety Difficulty Not difficult at all Somewhat difficult Somewhat difficult  Somewhat difficult      9. Vitamin D deficiency Is on daily vitamind supplement  Wt Readings from Last 3 Encounters:  10/20/21 126 lb (57.2 kg)  07/18/21 118 lb 9.6 oz (53.8 kg)  06/04/21 119 lb (54 kg)    New complaints: Stays hoarse all the time- would like to see ENT  Allergies  Allergen Reactions   Alendronate Sodium Other (See Comments)    Chest pain   Lexapro [Escitalopram Oxalate] Other (See Comments)    Makes lymphangitic colitis worse   Amoxicillin Other (See Comments)    diarrhea   Aspirin Other (See Comments)    Per the patient, made the gastric ulcer worse.   Clindamycin/Lincomycin Other (See Comments)    unknown   Keflex [Cephalexin] Other (See Comments)    unknown   Levofloxacin Other (See Comments)    Insomnia and headache   Prednisone Other (See Comments)    Nervousness   Sudafed [Pseudoephedrine Hcl] Other (See Comments)    insomnia   Sulfa Antibiotics Hives   Toprol Xl [Metoprolol Succinate] Other (See Comments)    Unknown reaction   Trimethoprim     Unknown reaction   Zithromax [Azithromycin] Rash  Outpatient Encounter Medications as of 10/20/2021  Medication Sig   acetaminophen (TYLENOL) 500 MG tablet Take 500 mg by mouth every 6 (six) hours as needed for mild pain.   albuterol (VENTOLIN HFA) 108 (90 Base) MCG/ACT inhaler Inhale 2 puffs into the lungs every 6 (six) hours as needed.   CALCIUM PO Take by mouth daily.   Cholecalciferol (VITAMIN D) 50 MCG (2000 UT) CAPS Take 2,000 Units by mouth daily.   clonazePAM (KLONOPIN) 0.5 MG tablet 1/2 to 1 po prn   famotidine (PEPCID) 20 MG tablet Take 1 tablet (20 mg total) by mouth 2 (two) times daily as needed.   fluticasone (FLONASE) 50 MCG/ACT nasal spray Place 2 sprays into both nostrils daily.   lisinopril (ZESTRIL) 20 MG tablet Take 1 tablet (20 mg total) by mouth daily.   mesalamine (LIALDA) 1.2 g EC tablet TAKE 2 TABLETS BY MOUTH EVERY MORNING WITH BREAKFAST   Multiple Vitamins-Minerals  (PRESERVISION AREDS PO) Take by mouth.   OVER THE COUNTER MEDICATION Take 1 tablet by mouth daily. Stress B with Zinc   potassium chloride (KLOR-CON) 10 MEQ tablet Take 1 tablet (10 mEq total) by mouth daily.   topiramate (TOPAMAX) 25 MG tablet Take 1 tablet (25 mg total) by mouth 2 (two) times daily.   vitamin B-12 (CYANOCOBALAMIN) 1000 MCG tablet Take 1,000 mcg by mouth daily.   vitamin C (ASCORBIC ACID) 500 MG tablet Take 500 mg by mouth daily.   No facility-administered encounter medications on file as of 10/20/2021.    Past Surgical History:  Procedure Laterality Date   ABDOMINAL HYSTERECTOMY  1980   APPENDECTOMY     BACK SURGERY     BREAST BIOPSY Left    CARPAL TUNNEL RELEASE Left 09/13/2020   Procedure: CARPAL TUNNEL RELEASE;  Surgeon: Roseanne Kaufman, MD;  Location: [place];  Service: Orthopedics;  Laterality: Left;   CATARACT EXTRACTION W/ INTRAOCULAR LENS  IMPLANT, BILATERAL Bilateral    COLONOSCOPY     ESOPHAGOGASTRODUODENOSCOPY (EGD) WITH ESOPHAGEAL DILATION  X 1   EYE SURGERY     LUMBAR DISC SURGERY     "fragmented disc"   ORIF RADIAL FRACTURE Left 09/13/2020   Procedure: HARDWARE REMOVAL, OPEN REDUCTION INTERNAL FIXATION LEFT RADIUS;  Surgeon: Roseanne Kaufman, MD;  Location: [place];  Service: Orthopedics;  Laterality: Left;  Block with IV sedation   POLYPECTOMY     SQUAMOUS CELL CARCINOMA EXCISION     nose tip    Family History  Problem Relation Age of Onset   Stroke Mother    Heart disease Father        MI   Heart attack Father    Cancer Sister        Brain tumor   Hypertension Sister    Osteoporosis Sister    Hypertension Sister    Breast cancer Sister 52   Hypertension Sister    Osteoporosis Sister    Hypertension Sister    Hypertension Sister    Stroke Sister    Hypertension Sister    Hypertension Sister    Colon cancer Brother    Heart disease Brother    Diabetes Son 40   Diabetes Son 11   Brain cancer Son        deceased   Cancer Son         brain   Diabetes Maternal Uncle    Colon cancer Other        nephew   Thyroid cancer Other  niece   Breast cancer Other    Breast cancer Other    Breast cancer Other    Colon polyps Neg Hx    Esophageal cancer Neg Hx    Rectal cancer Neg Hx    Stomach cancer Neg Hx       Controlled substance contract: N/A     Review of Systems  Constitutional:  Negative for diaphoresis.  Eyes:  Negative for pain.  Respiratory:  Negative for shortness of breath.   Cardiovascular:  Negative for chest pain, palpitations and leg swelling.  Gastrointestinal:  Negative for abdominal pain.  Endocrine: Negative for polydipsia.  Skin:  Negative for rash.  Neurological:  Negative for dizziness, weakness and headaches.  Hematological:  Does not bruise/bleed easily.  All other systems reviewed and are negative.      Objective:   Physical Exam Vitals and nursing note reviewed.  Constitutional:      General: She is not in acute distress.    Appearance: Normal appearance. She is well-developed.  HENT:     Head: Normocephalic.     Right Ear: Tympanic membrane normal.     Left Ear: Tympanic membrane normal.     Nose: Nose normal.     Mouth/Throat:     Mouth: Mucous membranes are moist.  Eyes:     Pupils: Pupils are equal, round, and reactive to light.  Neck:     Vascular: No carotid bruit or JVD.  Cardiovascular:     Rate and Rhythm: Normal rate and regular rhythm.     Heart sounds: Normal heart sounds.  Pulmonary:     Effort: Pulmonary effort is normal. No respiratory distress.     Breath sounds: Normal breath sounds. No wheezing or rales.  Chest:     Chest wall: No tenderness.  Abdominal:     General: Bowel sounds are normal. There is no distension or abdominal bruit.     Palpations: Abdomen is soft. There is no hepatomegaly, splenomegaly, mass or pulsatile mass.     Tenderness: There is no abdominal tenderness.  Musculoskeletal:        General: Normal range of motion.      Cervical back: Normal range of motion and neck supple.  Lymphadenopathy:     Cervical: No cervical adenopathy.  Skin:    General: Skin is warm and dry.  Neurological:     Mental Status: She is alert and oriented to person, place, and time.     Deep Tendon Reflexes: Reflexes are normal and symmetric.     Comments: head tremor  Psychiatric:        Behavior: Behavior normal.        Thought Content: Thought content normal.        Judgment: Judgment normal.     BP (!) 157/67   Temp 98.6 F (37 C) (Temporal)   Resp 20   Ht _0  (1.549 m)   Wt 126 lb (57.2 kg)   SpO2 98%   BMI 23.81 kg/m        Assessment & Plan:  ANNIA GOMM comes in today with chief complaint of Medical Management of Chronic Issues   Diagnosis and orders addressed:  1. Essential hypertension Low sodium diet - lisinopril (ZESTRIL) 20 MG tablet; Take 1 tablet (20 mg total) by mouth daily.  Dispense: 90 tablet; Refill: 1 - CBC with Differential/Platelet - CMP14+EGFR  2. Mixed hyperlipidemia Low fta diet - Lipid panel  3. Hypokalemia Labs pending - potassium chloride (  KLOR-CON) 10 MEQ tablet; Take 1 tablet (10 mEq total) by mouth daily.  Dispense: 90 tablet; Refill: 1  4. Diverticulosis Watch diet to prevent flare up  5. Barrett's esophagus without dysplasia Avoid spicy foods Do not eat 2 hours prior to bedtime  - famotidine (PEPCID) 20 MG tablet; Take 1 tablet (20 mg total) by mouth 2 (two) times daily as needed.  Dispense: 180 tablet; Refill: 1  6. Essential tremor - topiramate (TOPAMAX) 25 MG tablet; Take 1 tablet (25 mg total) by mouth 2 (two) times daily.  Dispense: 60 tablet; Refill: 2  7. Memory difficulties Read daily to exercise brain  8. Anxiety Stress management  9. Vitamin D deficiency Daily vitamin d supplement  10. Hoarseness Referral to ENT - Ambulatory referral to ENT   Labs pending Health Maintenance reviewed Diet and exercise encouraged  Follow up plan: 6  months   Mary-Margaret Hassell Done, FNP

## 2021-10-20 NOTE — Patient Instructions (Signed)

## 2021-10-21 LAB — CBC WITH DIFFERENTIAL/PLATELET
Basophils Absolute: 0.1 10*3/uL (ref 0.0–0.2)
Basos: 1 %
EOS (ABSOLUTE): 0.1 10*3/uL (ref 0.0–0.4)
Eos: 3 %
Hematocrit: 38.6 % (ref 34.0–46.6)
Hemoglobin: 12.7 g/dL (ref 11.1–15.9)
Immature Grans (Abs): 0 10*3/uL (ref 0.0–0.1)
Immature Granulocytes: 0 %
Lymphocytes Absolute: 1.8 10*3/uL (ref 0.7–3.1)
Lymphs: 38 %
MCH: 29.7 pg (ref 26.6–33.0)
MCHC: 32.9 g/dL (ref 31.5–35.7)
MCV: 90 fL (ref 79–97)
Monocytes Absolute: 0.4 10*3/uL (ref 0.1–0.9)
Monocytes: 8 %
Neutrophils Absolute: 2.5 10*3/uL (ref 1.4–7.0)
Neutrophils: 50 %
Platelets: 280 10*3/uL (ref 150–450)
RBC: 4.28 x10E6/uL (ref 3.77–5.28)
RDW: 12.7 % (ref 11.7–15.4)
WBC: 4.8 10*3/uL (ref 3.4–10.8)

## 2021-10-21 LAB — CMP14+EGFR
ALT: 12 IU/L (ref 0–32)
AST: 19 IU/L (ref 0–40)
Albumin/Globulin Ratio: 1.9 (ref 1.2–2.2)
Albumin: 4.1 g/dL (ref 3.6–4.6)
Alkaline Phosphatase: 65 IU/L (ref 44–121)
BUN/Creatinine Ratio: 9 — ABNORMAL LOW (ref 12–28)
BUN: 8 mg/dL (ref 8–27)
Bilirubin Total: 0.4 mg/dL (ref 0.0–1.2)
CO2: 22 mmol/L (ref 20–29)
Calcium: 9.1 mg/dL (ref 8.7–10.3)
Chloride: 106 mmol/L (ref 96–106)
Creatinine, Ser: 0.85 mg/dL (ref 0.57–1.00)
Globulin, Total: 2.2 g/dL (ref 1.5–4.5)
Glucose: 101 mg/dL — ABNORMAL HIGH (ref 70–99)
Potassium: 4 mmol/L (ref 3.5–5.2)
Sodium: 144 mmol/L (ref 134–144)
Total Protein: 6.3 g/dL (ref 6.0–8.5)
eGFR: 65 mL/min/{1.73_m2} (ref >59)

## 2021-10-21 LAB — LIPID PANEL
Chol/HDL Ratio: 2.6 ratio (ref 0.0–4.4)
Cholesterol, Total: 276 mg/dL — ABNORMAL HIGH (ref 100–199)
HDL: 106 mg/dL (ref >39)
LDL Chol Calc (NIH): 149 mg/dL — ABNORMAL HIGH (ref 0–99)
Triglycerides: 126 mg/dL (ref 0–149)
VLDL Cholesterol Cal: 21 mg/dL (ref 5–40)

## 2021-11-14 ENCOUNTER — Other Ambulatory Visit: Payer: Self-pay | Admitting: Nurse Practitioner

## 2021-12-12 ENCOUNTER — Ambulatory Visit: Payer: Medicare PPO | Admitting: Nurse Practitioner

## 2021-12-12 ENCOUNTER — Encounter: Payer: Self-pay | Admitting: Nurse Practitioner

## 2021-12-12 VITALS — BP 151/61 | HR 64 | Temp 97.3°F | Resp 20 | Ht 61.0 in | Wt 122.0 lb

## 2021-12-12 DIAGNOSIS — J302 Other seasonal allergic rhinitis: Secondary | ICD-10-CM

## 2021-12-12 MED ORDER — FLUTICASONE PROPIONATE 50 MCG/ACT NA SUSP: 2.0000 | Freq: Every day | NASAL | 6 refills | Status: DC

## 2021-12-12 MED ORDER — LORATADINE 10 MG PO TABS: 10.0000 mg | ORAL_TABLET | Freq: Every day | ORAL | 11 refills | Status: DC

## 2021-12-12 NOTE — Progress Notes (Signed)
   Subjective:    Patient ID: Rachel Walton, female    DOB: 1930/03/07, 88 y.o.   MRN: [id number]   Chief Complaint: Hoarse   HPI Patient come sin says she stays hoarse all the time. Has been going on fo rseveral weeks. She uses flonase daily. No congestion, no cough and no fever.     Review of Systems  Constitutional:  Negative for chills and fever.  HENT:  Positive for postnasal drip, rhinorrhea and voice change (hoarse).   Neurological: Negative.   Psychiatric/Behavioral: Negative.         Objective:   Physical Exam Constitutional:      Appearance: Normal appearance.  HENT:     Right Ear: Tympanic membrane normal.     Left Ear: Tympanic membrane normal.     Nose: Congestion and rhinorrhea present.     Mouth/Throat:     Pharynx: No oropharyngeal exudate or posterior oropharyngeal erythema.  Cardiovascular:     Rate and Rhythm: Normal rate and regular rhythm.     Heart sounds: Normal heart sounds.  Pulmonary:     Effort: Pulmonary effort is normal.     Breath sounds: Normal breath sounds.  Skin:    General: Skin is warm.  Neurological:     General: No focal deficit present.     Mental Status: She is alert and oriented to person, place, and time.  Psychiatric:        Mood and Affect: Mood normal.        Behavior: Behavior normal.     BP (!) 151/61   Pulse 64   Temp (!) 97.3 F (36.3 C) (Temporal)   Resp 20   Ht '5\' 1"'$  (1.549 m)   Wt 122 lb (55.3 kg)   SpO2 97%   BMI 23.05 kg/m '      Assessment & Plan:

## 2021-12-15 ENCOUNTER — Ambulatory Visit
Admission: RE | Admit: 2021-12-15 | Discharge: 2021-12-15 | Disposition: A | Discharge status: Home / Self Care | Payer: Medicare PPO | Source: Ambulatory Visit | Attending: Nurse Practitioner | Admitting: Nurse Practitioner

## 2021-12-15 DIAGNOSIS — Z1231 Encounter for screening mammogram for malignant neoplasm of breast: Secondary | ICD-10-CM | POA: Diagnosis not present

## 2021-12-17 ENCOUNTER — Other Ambulatory Visit: Payer: Self-pay | Admitting: Nurse Practitioner

## 2022-02-02 ENCOUNTER — Other Ambulatory Visit: Payer: Self-pay | Admitting: Nurse Practitioner

## 2022-02-02 DIAGNOSIS — F419 Anxiety disorder, unspecified: Secondary | ICD-10-CM

## 2022-03-17 ENCOUNTER — Telehealth: Payer: Self-pay | Admitting: Nurse Practitioner

## 2022-03-17 ENCOUNTER — Ambulatory Visit: Payer: Medicare PPO | Admitting: Nurse Practitioner

## 2022-03-17 ENCOUNTER — Encounter: Payer: Self-pay | Admitting: Nurse Practitioner

## 2022-03-17 VITALS — BP 153/65 | HR 88 | Temp 98.0°F | Ht 61.0 in | Wt 127.0 lb

## 2022-03-17 DIAGNOSIS — R5383 Other fatigue: Secondary | ICD-10-CM | POA: Diagnosis not present

## 2022-03-17 DIAGNOSIS — R300 Dysuria: Secondary | ICD-10-CM

## 2022-03-17 LAB — URINALYSIS, COMPLETE
Bilirubin, UA: NEGATIVE
Glucose, UA: NEGATIVE
Ketones, UA: NEGATIVE
Leukocytes,UA: NEGATIVE
Nitrite, UA: NEGATIVE
Protein,UA: NEGATIVE
RBC, UA: NEGATIVE
Specific Gravity, UA: <1.005 — ABNORMAL LOW (ref 1.005–1.030)
Urobilinogen, Ur: 0.2 mg/dL (ref 0.2–1.0)
pH, UA: 5 (ref 5.0–7.5)

## 2022-03-17 LAB — MICROSCOPIC EXAMINATION
Bacteria, UA: NONE SEEN
Epithelial Cells (non renal): NONE SEEN /hpf (ref 0–10)
RBC, Urine: NONE SEEN /hpf (ref 0–2)
Renal Epithel, UA: NONE SEEN /hpf
WBC, UA: NONE SEEN /hpf (ref 0–5)

## 2022-03-17 NOTE — Patient Instructions (Signed)

## 2022-03-17 NOTE — Telephone Encounter (Signed)
Pt is coming in for labs

## 2022-03-17 NOTE — Telephone Encounter (Signed)
Will hav eto come in and have labs drawn

## 2022-03-17 NOTE — Progress Notes (Signed)
   Subjective:    Patient ID: Rachel Walton, female    DOB: 1929-09-01, 88 y.o.   MRN: [id number]   Chief Complaint: UTI  Urinary Tract Infection  This is a new problem. The current episode started today. The problem has been rapidly worsening. The quality of the pain is described as burning and aching. The pain is at a severity of 7/10. The pain is moderate. There has been no fever. She is Not sexually active. There is No history of pyelonephritis. Associated symptoms include frequency, hesitancy and urgency. Pertinent negatives include no flank pain. She has tried nothing for the symptoms. The treatment provided moderate relief.       Review of Systems  Genitourinary:  Positive for frequency, hesitancy and urgency. Negative for flank pain.       Objective:   Physical Exam Constitutional:      Appearance: Normal appearance.  Cardiovascular:     Rate and Rhythm: Normal rate and regular rhythm.     Heart sounds: Normal heart sounds.  Pulmonary:     Effort: Pulmonary effort is normal.     Breath sounds: Normal breath sounds.  Skin:    General: Skin is warm.  Neurological:     General: No focal deficit present.     Mental Status: She is alert and oriented to person, place, and time.  Psychiatric:        Mood and Affect: Mood normal.        Behavior: Behavior normal.    BP (!) 153/65   Pulse 88   Temp 98 F (36.7 C)   Ht '5\' 1"'$  (1.549 m)   Wt 127 lb (57.6 kg)   SpO2 97%   BMI 24.00 kg/m    Urine clear     Assessment & Plan:   Rachel Walton in today with chief complaint of Urinary Tract Infection   1. Dysuria Take medication as prescribe Cotton underwear Take shower not bath Cranberry juice, yogurt Force fluids AZO over the counter X2 days Culture pending RTO prn  - Urinalysis, Complete    The above assessment and management plan was discussed with the patient. The patient verbalized understanding of and has agreed to the management plan. Patient is  aware to call the clinic if symptoms persist or worsen. Patient is aware when to return to the clinic for a follow-up visit. Patient educated on when it is appropriate to go to the emergency department.   Mary-Margaret Hassell Done, FNP

## 2022-03-18 LAB — CBC WITH DIFFERENTIAL/PLATELET
Basophils Absolute: 0.1 10*3/uL (ref 0.0–0.2)
Basos: 1 %
EOS (ABSOLUTE): 0.2 10*3/uL (ref 0.0–0.4)
Eos: 3 %
Hematocrit: 37.5 % (ref 34.0–46.6)
Hemoglobin: 12.6 g/dL (ref 11.1–15.9)
Immature Grans (Abs): 0 10*3/uL (ref 0.0–0.1)
Immature Granulocytes: 0 %
Lymphocytes Absolute: 2.5 10*3/uL (ref 0.7–3.1)
Lymphs: 39 %
MCH: 29.9 pg (ref 26.6–33.0)
MCHC: 33.6 g/dL (ref 31.5–35.7)
MCV: 89 fL (ref 79–97)
Monocytes Absolute: 0.4 10*3/uL (ref 0.1–0.9)
Monocytes: 6 %
Neutrophils Absolute: 3.4 10*3/uL (ref 1.4–7.0)
Neutrophils: 51 %
Platelets: 275 10*3/uL (ref 150–450)
RBC: 4.21 x10E6/uL (ref 3.77–5.28)
RDW: 13.1 % (ref 11.7–15.4)
WBC: 6.4 10*3/uL (ref 3.4–10.8)

## 2022-03-18 LAB — CMP14+EGFR
ALT: 10 IU/L (ref 0–32)
AST: 15 IU/L (ref 0–40)
Albumin/Globulin Ratio: 1.8 (ref 1.2–2.2)
Albumin: 4.1 g/dL (ref 3.6–4.6)
Alkaline Phosphatase: 64 IU/L (ref 44–121)
BUN/Creatinine Ratio: 9 — ABNORMAL LOW (ref 12–28)
BUN: 8 mg/dL — ABNORMAL LOW (ref 10–36)
Bilirubin Total: <0.2 mg/dL (ref 0.0–1.2)
CO2: 22 mmol/L (ref 20–29)
Calcium: 8.8 mg/dL (ref 8.7–10.3)
Chloride: 104 mmol/L (ref 96–106)
Creatinine, Ser: 0.89 mg/dL (ref 0.57–1.00)
Globulin, Total: 2.3 g/dL (ref 1.5–4.5)
Glucose: 128 mg/dL — ABNORMAL HIGH (ref 70–99)
Potassium: 3.8 mmol/L (ref 3.5–5.2)
Sodium: 141 mmol/L (ref 134–144)
Total Protein: 6.4 g/dL (ref 6.0–8.5)
eGFR: 62 mL/min/{1.73_m2} (ref >59)

## 2022-03-18 LAB — LIPID PANEL
Chol/HDL Ratio: 3 ratio (ref 0.0–4.4)
Cholesterol, Total: 265 mg/dL — ABNORMAL HIGH (ref 100–199)
HDL: 89 mg/dL (ref >39)
LDL Chol Calc (NIH): 137 mg/dL — ABNORMAL HIGH (ref 0–99)
Triglycerides: 222 mg/dL — ABNORMAL HIGH (ref 0–149)
VLDL Cholesterol Cal: 39 mg/dL (ref 5–40)

## 2022-03-18 LAB — VITAMIN B12: Vitamin B-12: 1297 pg/mL — ABNORMAL HIGH (ref 232–1245)

## 2022-03-19 ENCOUNTER — Other Ambulatory Visit: Payer: Self-pay | Admitting: Nurse Practitioner

## 2022-03-19 LAB — URINE CULTURE

## 2022-03-25 ENCOUNTER — Emergency Department (HOSPITAL_BASED_OUTPATIENT_CLINIC_OR_DEPARTMENT_OTHER)
Admission: EM | Admit: 2022-03-25 | Discharge: 2022-03-25 | Discharge status: Against Medical Advice | Payer: Medicare PPO

## 2022-03-25 ENCOUNTER — Other Ambulatory Visit: Payer: Self-pay

## 2022-03-30 ENCOUNTER — Telehealth: Payer: Self-pay | Admitting: Nurse Practitioner

## 2022-03-30 ENCOUNTER — Other Ambulatory Visit: Payer: Self-pay

## 2022-03-30 ENCOUNTER — Other Ambulatory Visit (HOSPITAL_COMMUNITY): Payer: Self-pay | Admitting: Radiology

## 2022-03-30 ENCOUNTER — Emergency Department (HOSPITAL_COMMUNITY): Payer: Medicare PPO

## 2022-03-30 ENCOUNTER — Other Ambulatory Visit (HOSPITAL_COMMUNITY): Payer: Medicare PPO

## 2022-03-30 ENCOUNTER — Emergency Department (HOSPITAL_COMMUNITY)
Admission: EM | Admit: 2022-03-30 | Discharge: 2022-03-30 | Disposition: A | Discharge status: Home / Self Care | Payer: Medicare PPO | Attending: Emergency Medicine | Admitting: Emergency Medicine

## 2022-03-30 ENCOUNTER — Encounter (HOSPITAL_COMMUNITY): Payer: Self-pay | Admitting: Emergency Medicine

## 2022-03-30 DIAGNOSIS — M47816 Spondylosis without myelopathy or radiculopathy, lumbar region: Secondary | ICD-10-CM | POA: Diagnosis not present

## 2022-03-30 DIAGNOSIS — M542 Cervicalgia: Secondary | ICD-10-CM | POA: Diagnosis not present

## 2022-03-30 DIAGNOSIS — M545 Low back pain, unspecified: Secondary | ICD-10-CM | POA: Diagnosis not present

## 2022-03-30 DIAGNOSIS — K5730 Diverticulosis of large intestine without perforation or abscess without bleeding: Secondary | ICD-10-CM | POA: Diagnosis not present

## 2022-03-30 DIAGNOSIS — M5136 Other intervertebral disc degeneration, lumbar region: Secondary | ICD-10-CM | POA: Diagnosis not present

## 2022-03-30 DIAGNOSIS — W1830XA Fall on same level, unspecified, initial encounter: Secondary | ICD-10-CM | POA: Insufficient documentation

## 2022-03-30 DIAGNOSIS — Z90710 Acquired absence of both cervix and uterus: Secondary | ICD-10-CM | POA: Diagnosis not present

## 2022-03-30 DIAGNOSIS — M4312 Spondylolisthesis, cervical region: Secondary | ICD-10-CM | POA: Diagnosis not present

## 2022-03-30 DIAGNOSIS — R102 Pelvic and perineal pain: Secondary | ICD-10-CM | POA: Diagnosis not present

## 2022-03-30 DIAGNOSIS — S0990XA Unspecified injury of head, initial encounter: Secondary | ICD-10-CM | POA: Diagnosis not present

## 2022-03-30 DIAGNOSIS — M160 Bilateral primary osteoarthritis of hip: Secondary | ICD-10-CM | POA: Diagnosis not present

## 2022-03-30 DIAGNOSIS — R519 Headache, unspecified: Secondary | ICD-10-CM | POA: Diagnosis not present

## 2022-03-30 DIAGNOSIS — W19XXXA Unspecified fall, initial encounter: Secondary | ICD-10-CM

## 2022-03-30 NOTE — ED Notes (Signed)
Patient verbalizes understanding of discharge instructions. Opportunity for questioning and answers were provided. Armband removed by staff, pt discharged from ED. Ambulated out to lobby, will be driven home by husband

## 2022-03-30 NOTE — ED Notes (Signed)
Patient transported to CT 

## 2022-03-30 NOTE — Discharge Instructions (Signed)
There was no brain bleeding or sign of fracture on your head and hip CT scans.

## 2022-03-30 NOTE — ED Triage Notes (Addendum)
Pt fell 03/21/22 and hit head. Denies loc. Pt now c/o bilateral neck stiffness and and headache. Pt also c/o groin pain as well. Nad in triage. Ambulatory. Not on any blood thinners

## 2022-03-30 NOTE — ED Provider Notes (Signed)
[facility] EMERGENCY DEPARTMENT Provider Note   CSN: [id number] Arrival date & time: 03/30/22  1029     History  Chief Complaint  Patient presents with   Lytle Michaels    ERRICKA FALKNER is a 88 y.o. female here with a fall 15 days ago in driveway, mechanical, complaining of gradual onset posterior headache daily and right pelvic pain.  Able to amabulate.  Not on A/C.  HPI     Home Medications Prior to Admission medications   Medication Sig Start Date End Date Taking? Authorizing Provider  acetaminophen (TYLENOL) 500 MG tablet Take 500 mg by mouth every 6 (six) hours as needed for mild pain.    [provider]  albuterol (VENTOLIN HFA) 108 (90 Base) MCG/ACT inhaler Inhale 2 puffs into the lungs every 6 (six) hours as needed. 04/02/21   Loman Brooklyn, FNP  CALCIUM PO Take by mouth daily.    [provider]  Cholecalciferol (VITAMIN D) 50 MCG (2000 UT) CAPS Take 2,000 Units by mouth daily.    [provider]  clonazePAM (KLONOPIN) 0.5 MG tablet TAKE 1/2 TO 1 TABLET AS NEEDED 02/03/22   Hassell Done, Mary-Margaret, FNP  famotidine (PEPCID) 20 MG tablet Take 1 tablet (20 mg total) by mouth 2 (two) times daily as needed. 10/20/21   Hassell Done, Mary-Margaret, FNP  fluticasone (FLONASE) 50 MCG/ACT nasal spray Place 2 sprays into both nostrils daily. 12/12/21   Hassell Done, Mary-Margaret, FNP  lisinopril (ZESTRIL) 20 MG tablet Take 1 tablet (20 mg total) by mouth daily. 10/20/21   Hassell Done, Mary-Margaret, FNP  loratadine (CLARITIN) 10 MG tablet Take 1 tablet (10 mg total) by mouth daily. 12/12/21   Hassell Done Mary-Margaret, FNP  mesalamine (LIALDA) 1.2 g EC tablet TAKE 2 TABLETS BY MOUTH EVERY MORNING WITH BREAKFAST 03/19/22   Chevis Pretty, FNP  Multiple Vitamins-Minerals (PRESERVISION AREDS PO) Take by mouth.    [provider]  OVER THE COUNTER MEDICATION Take 1 tablet by mouth daily. Stress B with Zinc    [provider]  potassium chloride (KLOR-CON) 10 MEQ tablet  Take 1 tablet (10 mEq total) by mouth daily. 10/20/21   Hassell Done, Mary-Margaret, FNP  topiramate (TOPAMAX) 25 MG tablet Take 1 tablet (25 mg total) by mouth 2 (two) times daily. 10/20/21   Hassell Done Mary-Margaret, FNP  vitamin B-12 (CYANOCOBALAMIN) 1000 MCG tablet Take 1,000 mcg by mouth daily.    [provider]  vitamin C (ASCORBIC ACID) 500 MG tablet Take 500 mg by mouth daily.    [provider]      Allergies    Alendronate sodium, Lexapro [escitalopram oxalate], Amoxicillin, Aspirin, Clindamycin/lincomycin, Keflex [cephalexin], Levofloxacin, Prednisone, Sudafed [pseudoephedrine hcl], Sulfa antibiotics, Toprol xl [metoprolol succinate], Trimethoprim, and Zithromax [azithromycin]    Review of Systems   Review of Systems  Physical Exam Updated Vital Signs BP (!) 150/81   Pulse 72   Temp 98 F (36.7 C) (Oral)   Resp 18   SpO2 100%  Physical Exam Constitutional:      General: She is not in acute distress. HENT:     Head: Normocephalic and atraumatic.  Eyes:     Conjunctiva/sclera: Conjunctivae normal.     Pupils: Pupils are equal, round, and reactive to light.  Cardiovascular:     Rate and Rhythm: Normal rate. Rhythm irregular.  Pulmonary:     Effort: Pulmonary effort is normal. No respiratory distress.  Abdominal:     General: There is no distension.     Tenderness: There is  no abdominal tenderness.  Musculoskeletal:        General: No swelling, tenderness, deformity or signs of injury. Normal range of motion.  Skin:    General: Skin is warm and dry.  Neurological:     General: No focal deficit present.     Mental Status: She is alert. Mental status is at baseline.  Psychiatric:        Mood and Affect: Mood normal.        Behavior: Behavior normal.     ED Results / Procedures / Treatments   Labs (all labs ordered are listed, but only abnormal results are displayed) Labs Reviewed - No data to display  EKG None  Radiology CT PELVIS WO  CONTRAST  Result Date: 03/30/2022 CLINICAL DATA:  Bilateral anterior pelvic pain after fall. EXAM: CT PELVIS WITHOUT CONTRAST TECHNIQUE: Multidetector CT imaging of the pelvis was performed following the standard protocol without intravenous contrast. RADIATION DOSE REDUCTION: This exam was performed according to the departmental dose-optimization program which includes automated exposure control, adjustment of the mA and/or kV according to patient size and/or use of iterative reconstruction technique. COMPARISON:  CT pelvis 07/01/2017 FINDINGS: Urinary Tract:  Unremarkable where included Bowel:  Sigmoid colon diverticulosis. Vascular/Lymphatic: Atherosclerosis is present, including aortoiliac atherosclerotic disease. No pathologic adenopathy. Reproductive:  Uterus absent.  Adnexa unremarkable. Other:  No supplemental non-categorized findings. Musculoskeletal: Moderate degenerative arthropathy of both hips with axial articular space loss and associated spurring of the femoral heads and acetabula. No fracture identified. No findings of sacral plexus or sciatic nerve impingement. No findings of hip effusion or regional bursitis. IMPRESSION: 1. No fracture or acute bony findings. 2. Moderate degenerative arthropathy of both hips. 3. Sigmoid colon diverticulosis. 4. Atherosclerosis. Aortic Atherosclerosis (ICD10-I70.0). Electronically Signed   By: Van Clines M.D.   On: 03/30/2022 13:38   CT Lumbar Spine Wo Contrast  Result Date: 03/30/2022 CLINICAL DATA:  Back trauma, fall on 03/21/2022. Back and pelvic pain. EXAM: CT LUMBAR SPINE WITHOUT CONTRAST TECHNIQUE: Multidetector CT imaging of the lumbar spine was performed without intravenous contrast administration. Multiplanar CT image reconstructions were also generated. RADIATION DOSE REDUCTION: This exam was performed according to the departmental dose-optimization program which includes automated exposure control, adjustment of the mA and/or kV according  to patient size and/or use of iterative reconstruction technique. COMPARISON:  CT abdomen 07/01/2017 FINDINGS: Segmentation: The lowest lumbar type non-rib-bearing vertebra is labeled as L5. Alignment: 3 mm degenerative retrolisthesis at L1-2, previously 2 mm on 07/01/2017. Vertebrae: Progressive loss of intervertebral disc height at L1-2 with vacuum disc phenomenon and multiple Schmorl's nodes at the intervertebral level likewise increased from 07/01/2017. Similar prominent loss of intervertebral disc height at L5-S1 with endplate sclerosis and nitrogen gas phenomenon. The remaining lumbar levels demonstrate preservation of intervertebral disc height. Pseudoarticulation of the spinous processes at L3-4 and L4-5. No acute fracture is observed. Paraspinal and other soft tissues: Atherosclerosis is present, including aortoiliac atherosclerotic disease. Cholelithiasis. Disc levels: T12-L1: Unremarkable. L1-2: No impingement. Mild disc bulge. Grade 1 degenerative retrolisthesis. L2-3: No impingement.  Mild disc bulge. L3-4: No impingement.  Bilateral degenerative facet arthropathy. L4-5: Mild central narrowing of the thecal sac and borderline left foraminal stenosis due to disc bulge and degenerative facet arthropathy. L5-S1: Mild left and borderline right foraminal stenosis due to intervertebral and facet spurring. Prior right laminectomy at this level. IMPRESSION: 1. No acute lumbar spine findings. 2. Lumbar spondylosis and degenerative disc disease, causing mild impingement at L4-5 and L5-S1. 3. Cholelithiasis. 4.  Pseudoarticulation of the spinous processes at L3-4 and L4-5. Cannot exclude Baastrup's disease. 5. Aortic atherosclerosis. Aortic Atherosclerosis (ICD10-I70.0). Electronically Signed   By: Van Clines M.D.   On: 03/30/2022 13:34   CT Head Wo Contrast  Result Date: 03/30/2022 CLINICAL DATA:  A 88 year old female presents with history of neck trauma and head trauma. EXAM: CT HEAD WITHOUT CONTRAST  CT CERVICAL SPINE WITHOUT CONTRAST TECHNIQUE: Multidetector CT imaging of the head and cervical spine was performed following the standard protocol without intravenous contrast. Multiplanar CT image reconstructions of the cervical spine were also generated. RADIATION DOSE REDUCTION: This exam was performed according to the departmental dose-optimization program which includes automated exposure control, adjustment of the mA and/or kV according to patient size and/or use of iterative reconstruction technique. COMPARISON:  May of 2021 CT of the head. Also May of 2021 CT of the cervical spine. FINDINGS: CT HEAD FINDINGS Brain: No evidence of acute infarction, hemorrhage, hydrocephalus, extra-axial collection or mass lesion/mass effect. Vascular: No hyperdense vessel or unexpected calcification. Skull: Normal. Negative for fracture or focal lesion. Chronic RIGHT mastoid effusion. Sinuses/Orbits: Visualized paranasal sinuses and orbits show no acute process to the extent evaluated. Other: None. CT CERVICAL SPINE FINDINGS Alignment: Unchanged appearance of spinal alignment since previous imaging. Straightening in the upper cervical spine of cervical lordotic curvature likely reflection positioning and related to underlying multilevel degenerative changes. Minimal anterolisthesis of C2 on C3 is a finding that has not changed and is associated with facet arthropathy. Anterolisthesis of C7 on T1 similarly unchanged. Skull base and vertebrae: No acute fracture. No primary bone lesion or focal pathologic process. Soft tissues and spinal canal: No prevertebral fluid or swelling. No visible canal hematoma. Disc levels: Multilevel degenerative changes and facet arthropathy unchanged from imaging from May of 2021. Degenerative changes are greatest at C3-4, C5-6 and C6-7 with disc space narrowing, osteophyte formation and uncovertebral spurring. Upper chest: Negative. Other: None IMPRESSION: 1. No acute intracranial abnormality.  Signs of atrophy and chronic microvascular ischemic change similar to previous imaging. 2. No evidence for acute fracture or traumatic subluxation of the cervical spine. 3. Multilevel degenerative changes and facet arthropathy unchanged from imaging from May of 2021. 4. Chronic RIGHT mastoid effusion. Electronically Signed   By: Zetta Bills M.D.   On: 03/30/2022 13:34   CT Cervical Spine Wo Contrast  Result Date: 03/30/2022 CLINICAL DATA:  A 88 year old female presents with history of neck trauma and head trauma. EXAM: CT HEAD WITHOUT CONTRAST CT CERVICAL SPINE WITHOUT CONTRAST TECHNIQUE: Multidetector CT imaging of the head and cervical spine was performed following the standard protocol without intravenous contrast. Multiplanar CT image reconstructions of the cervical spine were also generated. RADIATION DOSE REDUCTION: This exam was performed according to the departmental dose-optimization program which includes automated exposure control, adjustment of the mA and/or kV according to patient size and/or use of iterative reconstruction technique. COMPARISON:  May of 2021 CT of the head. Also May of 2021 CT of the cervical spine. FINDINGS: CT HEAD FINDINGS Brain: No evidence of acute infarction, hemorrhage, hydrocephalus, extra-axial collection or mass lesion/mass effect. Vascular: No hyperdense vessel or unexpected calcification. Skull: Normal. Negative for fracture or focal lesion. Chronic RIGHT mastoid effusion. Sinuses/Orbits: Visualized paranasal sinuses and orbits show no acute process to the extent evaluated. Other: None. CT CERVICAL SPINE FINDINGS Alignment: Unchanged appearance of spinal alignment since previous imaging. Straightening in the upper cervical spine of cervical lordotic curvature likely reflection positioning and related to underlying multilevel degenerative changes. Minimal  anterolisthesis of C2 on C3 is a finding that has not changed and is associated with facet arthropathy.  Anterolisthesis of C7 on T1 similarly unchanged. Skull base and vertebrae: No acute fracture. No primary bone lesion or focal pathologic process. Soft tissues and spinal canal: No prevertebral fluid or swelling. No visible canal hematoma. Disc levels: Multilevel degenerative changes and facet arthropathy unchanged from imaging from May of 2021. Degenerative changes are greatest at C3-4, C5-6 and C6-7 with disc space narrowing, osteophyte formation and uncovertebral spurring. Upper chest: Negative. Other: None IMPRESSION: 1. No acute intracranial abnormality. Signs of atrophy and chronic microvascular ischemic change similar to previous imaging. 2. No evidence for acute fracture or traumatic subluxation of the cervical spine. 3. Multilevel degenerative changes and facet arthropathy unchanged from imaging from May of 2021. 4. Chronic RIGHT mastoid effusion. Electronically Signed   By: Zetta Bills M.D.   On: 03/30/2022 13:34    Procedures Procedures    Medications Ordered in ED Medications - No data to display  ED Course/ Medical Decision Making/ A&P                           Medical Decision Making  Mechanical fall Pt well appearing No evidence of trauma, deformities on exam CT imaging ordered and personally reviewed and interpreted - no ICH, no pelvic fx; incidental findings noted; can f/u with PCP.Marland Kitchen  No further trauma imaging or labs warranted at this time        Final Clinical Impression(s) / ED Diagnoses Final diagnoses:  Fall, initial encounter    Rx / DC Orders ED Discharge Orders     None         Othel Dicostanzo, Carola Rhine, MD 03/30/22 1520

## 2022-03-30 NOTE — Telephone Encounter (Signed)
Patient aware does not need any orders. Go straight to ED ASAP patient advised understanding. Patient states Rachel Walton is going to bring her.

## 2022-04-01 ENCOUNTER — Ambulatory Visit: Payer: Medicare PPO | Admitting: Family Medicine

## 2022-04-01 ENCOUNTER — Encounter: Payer: Self-pay | Admitting: Family Medicine

## 2022-04-01 VITALS — BP 170/77 | HR 83 | Temp 97.4°F | Ht 61.0 in | Wt 126.0 lb

## 2022-04-01 DIAGNOSIS — M25552 Pain in left hip: Secondary | ICD-10-CM | POA: Diagnosis not present

## 2022-04-01 DIAGNOSIS — M25551 Pain in right hip: Secondary | ICD-10-CM | POA: Diagnosis not present

## 2022-04-01 DIAGNOSIS — M160 Bilateral primary osteoarthritis of hip: Secondary | ICD-10-CM

## 2022-04-01 DIAGNOSIS — R300 Dysuria: Secondary | ICD-10-CM | POA: Diagnosis not present

## 2022-04-01 LAB — URINALYSIS
Bilirubin, UA: NEGATIVE
Glucose, UA: NEGATIVE
Ketones, UA: NEGATIVE
Nitrite, UA: NEGATIVE
Protein,UA: NEGATIVE
RBC, UA: NEGATIVE
Specific Gravity, UA: 1.015 (ref 1.005–1.030)
Urobilinogen, Ur: 0.2 mg/dL (ref 0.2–1.0)
pH, UA: 5 (ref 5.0–7.5)

## 2022-04-01 MED ORDER — DEXAMETHASONE 2 MG PO TABS: ORAL_TABLET | ORAL | 0 refills | Status: DC

## 2022-04-01 NOTE — Progress Notes (Signed)
BP (!) 170/77   Pulse 83   Temp (!) 97.4 F (36.3 C)   Ht '5\' 1"'$  (1.549 m)   Wt 126 lb (57.2 kg)   SpO2 97%   BMI 23.81 kg/m    Subjective:   Patient ID: Rachel Walton, female    DOB: Nov 02, 1929, 88 y.o.   MRN: [id number]  HPI: Rachel Walton is a 88 y.o. female presenting on 04/01/2022 for Urinary Tract Infection   HPI Bilateral hip and pelvic pain Patient is coming in with complaints of bilateral hip and pelvic pain.  Hurts anteriorly on both groins, left is worse than right.  She was recently in the emergency department and had scans of both her hip and lumbar region and head and neck.  She says her biggest complaint coming in today is that it is affecting her walking ability to stand her legs the pain that she is having in her groin on both sides.  Worsens over the past few months.  She did have a fall where she fractured forearms but that was not it was before that.  No recent falls.  Relevant past medical, surgical, family and social history reviewed and updated as indicated. Interim medical history since our last visit reviewed. Allergies and medications reviewed and updated.  Review of Systems  Constitutional:  Negative for chills and fever.  Eyes:  Negative for visual disturbance.  Respiratory:  Negative for chest tightness and shortness of breath.   Cardiovascular:  Negative for chest pain and leg swelling.  Genitourinary:  Negative for difficulty urinating, dysuria, flank pain, frequency and hematuria.  Musculoskeletal:  Positive for arthralgias. Negative for back pain, gait problem, joint swelling and myalgias.  Skin:  Negative for rash.  Neurological:  Negative for light-headedness and headaches.  Psychiatric/Behavioral:  Negative for agitation and behavioral problems.   All other systems reviewed and are negative.   Per HPI unless specifically indicated above   Allergies as of 04/01/2022       Reactions   Alendronate Sodium Other (See Comments)   Chest  pain   Lexapro [escitalopram Oxalate] Other (See Comments)   Makes lymphangitic colitis worse   Amoxicillin Other (See Comments)   diarrhea   Aspirin Other (See Comments)   Per the patient, made the gastric ulcer worse.   Clindamycin/lincomycin Other (See Comments)   unknown   Keflex [cephalexin] Other (See Comments)   unknown   Levofloxacin Other (See Comments)   Insomnia and headache   Prednisone Other (See Comments)   Nervousness   Sudafed [pseudoephedrine Hcl] Other (See Comments)   insomnia   Sulfa Antibiotics Hives   Toprol Xl [metoprolol Succinate] Other (See Comments)   Unknown reaction   Trimethoprim    Unknown reaction   Zithromax [azithromycin] Rash        Medication List        Accurate as of April 01, 2022  2:31 PM. If you have any questions, ask your nurse or doctor.          acetaminophen 500 MG tablet Commonly known as: TYLENOL Take 500 mg by mouth every 6 (six) hours as needed for mild pain.   albuterol 108 (90 Base) MCG/ACT inhaler Commonly known as: VENTOLIN HFA Inhale 2 puffs into the lungs every 6 (six) hours as needed.   ascorbic acid 500 MG tablet Commonly known as: VITAMIN C Take 500 mg by mouth daily.   CALCIUM PO Take by mouth daily.   clonazePAM 0.5 MG tablet  Commonly known as: KLONOPIN TAKE 1/2 TO 1 TABLET AS NEEDED   cyanocobalamin 1000 MCG tablet Commonly known as: VITAMIN B12 Take 1,000 mcg by mouth daily.   dexamethasone 2 MG tablet Commonly known as: DECADRON Take 4 tablets for 3 days then 3 tablets for 3 days then 2 tablets for 3 days then 1 tablet for 3 days Started by: Worthy Rancher, MD   famotidine 20 MG tablet Commonly known as: PEPCID Take 1 tablet (20 mg total) by mouth 2 (two) times daily as needed.   fluticasone 50 MCG/ACT nasal spray Commonly known as: FLONASE Place 2 sprays into both nostrils daily.   lisinopril 20 MG tablet Commonly known as: ZESTRIL Take 1 tablet (20 mg total) by mouth  daily.   loratadine 10 MG tablet Commonly known as: CLARITIN Take 1 tablet (10 mg total) by mouth daily.   mesalamine 1.2 g EC tablet Commonly known as: LIALDA TAKE 2 TABLETS BY MOUTH EVERY MORNING WITH BREAKFAST   OVER THE COUNTER MEDICATION Take 1 tablet by mouth daily. Stress B with Zinc   potassium chloride 10 MEQ tablet Commonly known as: KLOR-CON Take 1 tablet (10 mEq total) by mouth daily.   PRESERVISION AREDS PO Take by mouth.   topiramate 25 MG tablet Commonly known as: Topamax Take 1 tablet (25 mg total) by mouth 2 (two) times daily.   Vitamin D 50 MCG (2000 UT) Caps Take 2,000 Units by mouth daily.         Objective:   BP (!) 170/77   Pulse 83   Temp (!) 97.4 F (36.3 C)   Ht '5\' 1"'$  (1.549 m)   Wt 126 lb (57.2 kg)   SpO2 97%   BMI 23.81 kg/m   Wt Readings from Last 3 Encounters:  04/01/22 126 lb (57.2 kg)  03/17/22 127 lb (57.6 kg)  12/12/21 122 lb (55.3 kg)    Physical Exam    Assessment & Plan:   Problem List Items Addressed This Visit   None Visit Diagnoses     Bilateral hip pain    -  Primary   Relevant Orders   Ambulatory referral to Physical Therapy   Dysuria       Relevant Orders   Urine Culture   Urinalysis   Bilateral primary osteoarthritis of hip       Relevant Medications   dexamethasone (DECADRON) 2 MG tablet   Other Relevant Orders   Ambulatory referral to Physical Therapy       CT of hip showed bilateral osteoarthritis.  CT lumbar showed possible nerve impingement with mild narrowing in the lumbar region.  Specially at the L4-L5.  Will also send in a course of dexamethasone  Urinalysis looks good, no sign of infection Follow up plan: Return if symptoms worsen or fail to improve.  Counseling provided for all of the vaccine components Orders Placed This Encounter  Procedures   Urine Culture   Urinalysis   Ambulatory referral to Physical Therapy    Caryl Pina, MD [facility] 04/01/2022, 2:31 PM

## 2022-04-04 LAB — URINE CULTURE

## 2022-04-06 ENCOUNTER — Ambulatory Visit: Payer: Medicare PPO | Admitting: Nurse Practitioner

## 2022-04-06 ENCOUNTER — Telehealth: Payer: Self-pay | Admitting: *Deleted

## 2022-04-06 ENCOUNTER — Encounter: Payer: Self-pay | Admitting: Nurse Practitioner

## 2022-04-06 VITALS — BP 155/72 | HR 67 | Temp 97.7°F | Resp 20 | Ht 61.0 in | Wt 127.0 lb

## 2022-04-06 DIAGNOSIS — M79604 Pain in right leg: Secondary | ICD-10-CM

## 2022-04-06 MED ORDER — KETOROLAC TROMETHAMINE 60 MG/2ML IM SOLN
60.0000 mg | Freq: Once | INTRAMUSCULAR | Status: AC
  Administered 2022-04-06: 60 mg via INTRAMUSCULAR

## 2022-04-06 MED ORDER — METHYLPREDNISOLONE ACETATE 80 MG/ML IJ SUSP
80.0000 mg | Freq: Once | INTRAMUSCULAR | Status: AC
  Administered 2022-04-06: 80 mg via INTRAMUSCULAR

## 2022-04-06 NOTE — Patient Outreach (Signed)
  Care Coordination TOC Note Transition Care Management Follow-up Telephone Call Date of discharge and from where: [facility] 03/30/22 How have you been since you were released from the hospital? "I'm still having pain" Any questions or concerns? No  Items Reviewed: Did the pt receive and understand the discharge instructions provided? Yes  Medications obtained and verified? Yes  Other? Yes Addressed EMMI red flag for felling little/no enjoyment in activities that normally are enjoyable. Falls assessment performed. Assessed mobility and assistive devices. Encouraged use of walker/cane. Any new allergies since your discharge? No  Dietary orders reviewed? Yes Do you have support at home? Yes   Home Care and Equipment/Supplies: Were home health services ordered? no If so, what is the name of the agency?   Has the agency set up a time to come to the patient's home? not applicable Were any new equipment or medical supplies ordered?  No What is the name of the medical supply agency?  Were you able to get the supplies/equipment? not applicable Do you have any questions related to the use of the equipment or supplies? No  Functional Questionnaire: (I = Independent and D = Dependent) ADLs: I  Bathing/Dressing- I  Meal Prep- I  Eating- I  Maintaining continence- I  Transferring/Ambulation- I  Managing Meds- I  Follow up appointments reviewed:  PCP Hospital f/u appt confirmed? Yes  Has seen Dr Dettinger on 04/01/22 and Chevis Pretty, [place] on 04/06/22 at 11:00 and has a f/u with Shelah Lewandowsky on 04/21/22 [facility] f/u appt confirmed?  Need not indicated   Are transportation arrangements needed? No  If their condition worsens, is the pt aware to call PCP or go to the Emergency Dept.? Yes Was the patient provided with contact information for the PCP's office or ED? Yes Was to pt encouraged to call back with questions or concerns? Yes  SDOH assessments and  interventions completed:   Yes  Care Coordination Interventions Activated:  Yes   Care Coordination Interventions:  Referred for Care Coordination Services:  RN Care Coordinator PHQ9 completed. Discussed that PCP is managing depression/anxiety    Encounter Outcome:  Pt. Visit Completed    Chong Sicilian, BSN, RN-BC RN Care Coordinator [place]: [phone] Main #: [phone]

## 2022-04-06 NOTE — Progress Notes (Addendum)
   Subjective:    Patient ID: Rachel Walton, female    DOB: 1929-10-17, 88 y.o.   MRN: [id number]   Chief Complaint: Leg Pain (No better/)   Leg Pain  The injury mechanism was a fall (03/21/22). The pain is present in the left hip. The quality of the pain is described as aching. The pain is severe. The pain has been Constant since onset. The symptoms are aggravated by movement and weight bearing. She has tried heat and acetaminophen for the symptoms.   Patient in c/o leg pain. She has been to urgent care and last saw dr. Warrick Parisian. He gave her steroids and she took 2 of them and said they have to much "caffeine" in them and she threw them away.   Review of Systems  Constitutional:  Negative for diaphoresis.  Eyes:  Negative for pain.  Respiratory:  Negative for shortness of breath.   Cardiovascular:  Negative for chest pain, palpitations and leg swelling.  Gastrointestinal:  Negative for abdominal pain.  Endocrine: Negative for polydipsia.  Skin:  Negative for rash.  Neurological:  Negative for dizziness, weakness and headaches.  Hematological:  Does not bruise/bleed easily.  All other systems reviewed and are negative.      Objective:   Physical Exam Vitals reviewed.  Constitutional:      Appearance: Normal appearance.  Cardiovascular:     Rate and Rhythm: Normal rate and regular rhythm.     Heart sounds: Normal heart sounds.  Pulmonary:     Effort: Pulmonary effort is normal.     Breath sounds: Normal breath sounds.  Musculoskeletal:     Comments: Pain right groin area on abduction and and adduction of leg  Skin:    General: Skin is warm.  Neurological:     General: No focal deficit present.     Mental Status: She is alert and oriented to person, place, and time.  Psychiatric:        Mood and Affect: Mood normal.        Behavior: Behavior normal.     BP (!) 155/72   Pulse 67   Temp 97.7 F (36.5 C) (Temporal)   Resp 20   Ht '5\' 1"'$  (1.549 m)   Wt 127 lb (57.6  kg)   SpO2 98%   BMI 24.00 kg/m        Assessment & Plan:   Rachel Walton in today with chief complaint of Leg Pain (No better/)   1. Right leg pain Moist heat 'rest RTO prn - methylPREDNISolone acetate (DEPO-MEDROL) injection 80 mg IM - ketorolac (TORADOL) injection 60 mg IM    The above assessment and management plan was discussed with the patient. The patient verbalized understanding of and has agreed to the management plan. Patient is aware to call the clinic if symptoms persist or worsen. Patient is aware when to return to the clinic for a follow-up visit. Patient educated on when it is appropriate to go to the emergency department.   Mary-Margaret Hassell Done, FNP

## 2022-04-06 NOTE — Patient Instructions (Signed)
Sciatica  Sciatica is pain, weakness, tingling, or loss of feeling (numbness) along the sciatic nerve. The sciatic nerve starts in the lower back and goes down the back of each leg. Sciatica usually affects one side of the body. Sciatica usually goes away on its own or with treatment. Sometimes, sciatica may come back. What are the causes? This condition happens when the sciatic nerve is pinched or has pressure put on it. This may be caused by: A disk in between the bones of the spine bulging out too far (herniated disk). Changes in the spinal disks due to aging. A condition that affects a muscle in the butt. Extra bone growth near the sciatic nerve. A break (fracture) of the area between your hip bones (pelvis). Pregnancy. Tumor. This is rare. What increases the risk? You are more likely to develop this condition if you: Play sports that put pressure or stress on the spine. Have poor strength and ease of movement (flexibility). Have had a back injury or back surgery. Sit for long periods of time. Do activities that involve bending or lifting over and over again. Are very overweight (obese). What are the signs or symptoms? Symptoms can vary from mild to very bad. They may include: Any of these problems in the lower back, leg, hip, or butt: Mild tingling, loss of feeling, or dull aches. A burning feeling. Sharp pains. Loss of feeling in the back of the calf or the sole of the foot. Leg weakness. Very bad back pain that makes it hard to move. These symptoms may get worse when you cough, sneeze, or laugh. They may also get worse when you sit or stand for long periods of time. How is this treated? This condition often gets better without any treatment. However, treatment may include: Changing or cutting back on physical activity when you have pain. Exercising, including strengthening and stretching. Putting ice or heat on the affected area. Shots of medicines to relieve pain and  swelling or to relax your muscles. Surgery. Follow these instructions at home: Medicines Take over-the-counter and prescription medicines only as told by your doctor. Ask your doctor if you should avoid driving or using machines while you are taking your medicine. Managing pain     If told, put ice on the affected area. To do this: Put ice in a plastic bag. Place a towel between your skin and the bag. Leave the ice on for 20 minutes, 2-3 times a day. If your skin turns bright red, take off the ice right away to prevent skin damage. The risk of skin damage is higher if you cannot feel pain, heat, or cold. If told, put heat on the affected area. Do this as often as told by your doctor. Use the heat source that your doctor tells you to use, such as a moist heat pack or a heating pad. Place a towel between your skin and the heat source. Leave the heat on for 20-30 minutes. If your skin turns bright red, take off the heat right away to prevent burns. The risk of burns is higher if you cannot feel pain, heat, or cold. Activity  Return to your normal activities when your doctor says that it is safe. Avoid activities that make your symptoms worse. Take short rests during the day. When you rest for a long time, do some physical activity or stretching between periods of rest. Avoid sitting for a long time without moving. Get up and move around at least one time each   hour. Do exercises and stretches as told by your doctor. Do not lift anything that is heavier than 10 lb (4.5 kg). Avoid lifting heavy things even when you do not have symptoms. Avoid lifting heavy things over and over. When you lift objects, always lift in a way that is safe for your body. To do this, you should: Bend your knees. Keep the object close to your body. Avoid twisting. General instructions Stay at a healthy weight. Wear comfortable shoes that support your feet. Avoid wearing high heels. Avoid sleeping on a mattress  that is too soft or too hard. You might have less pain if you sleep on a mattress that is firm enough to support your back. Contact a doctor if: Your pain is not controlled by medicine. Your pain does not get better. Your pain gets worse. Your pain lasts longer than 4 weeks. You lose weight without trying. Get help right away if: You cannot control when you pee (urinate) or poop (have a bowel movement). You have weakness in any of these areas and it gets worse: Lower back. The area between your hip bones. Butt. Legs. You have redness or swelling of your back. You have a burning feeling when you pee. Summary Sciatica is pain, weakness, tingling, or loss of feeling (numbness) along the sciatic nerve. This may include the lower back, legs, hips, and butt. This condition happens when the sciatic nerve is pinched or has pressure put on it. Treatment often includes rest, exercise, medicines, and putting ice or heat on the affected area. This information is not intended to replace advice given to you by your health care provider. Make sure you discuss any questions you have with your health care provider. Document Revised: 08/04/2021 Document Reviewed: 08/04/2021 Elsevier Patient Education  2023 Elsevier Inc.  

## 2022-04-08 ENCOUNTER — Telehealth: Payer: Self-pay

## 2022-04-08 NOTE — Telephone Encounter (Signed)
        Patient  visited [facility] on 11/20  Telephone encounter attempt :  1st  A HIPAA compliant voice message was left requesting a return call.  Instructed patient to call back   [place], [facility]  [phone] [address], [place], [place] [address] Phone: [phone] Email: [email or web address]

## 2022-04-09 ENCOUNTER — Telehealth: Payer: Self-pay

## 2022-04-09 ENCOUNTER — Other Ambulatory Visit: Payer: Self-pay | Admitting: Nurse Practitioner

## 2022-04-09 DIAGNOSIS — M25551 Pain in right hip: Secondary | ICD-10-CM | POA: Diagnosis not present

## 2022-04-09 DIAGNOSIS — F419 Anxiety disorder, unspecified: Secondary | ICD-10-CM

## 2022-04-09 NOTE — Telephone Encounter (Signed)
     Patient  visit on 11/20  at [facility] you been able to follow up with your primary care physician? Yes   The patient was or was not able to obtain any needed medicine or equipment. Yes   Are there diet recommendations that you are having difficulty following? NA   Patient expresses understanding of discharge instructions and education provided has no other needs at this time.  [place], [facility], Care Management  [phone] [address], [place], [place] [address] Phone: [phone] Email: [email or web address]

## 2022-04-17 ENCOUNTER — Ambulatory Visit: Payer: Self-pay | Admitting: *Deleted

## 2022-04-17 NOTE — Patient Outreach (Signed)
  Care Coordination   Initial Visit Note   Late entry fir 04/17/22 05/08/2022 Name: Rachel Walton MRN: [id number] DOB: 05/10/1930  Rachel Walton is a 88 y.o. year old female who sees Rachel Walton, [place] for primary care. I spoke with  Rachel Walton by phone today.  What matters to the patients health and wellness today?  Doing better but still weak  Falls broke wrist, arm, right leg/hip with pain She has not driven since she broke her wrist Mr spark has knee replacement and also use a walker & cane No bone density test- wants to get done   Discussed how to use her local [employer] curbside and delivery services Completed a conference call with her to her local [employer] No annual wellness visit    Goals Addressed               This Visit's Progress     Patient Stated     Pain of right leg (THN) (pt-stated)   Not on track     Care Coordination Interventions: Reviewed provider established plan for pain management Discussed importance of adherence to all scheduled medical appointments Discussed use of relaxation techniques and/or diversional activities to assist with pain reduction (distraction, imagery, relaxation, massage, acupressure, TENS, heat, and cold application Screening for signs and symptoms of depression related to chronic disease state  Assessed social determinant of health barriers Discussed bone density test Discussed use of medications for pain        SDOH assessments and interventions completed:  Yes  SDOH Interventions Today    Flowsheet Row Most Recent Value  SDOH Interventions   Food Insecurity Interventions Intervention Not Indicated  Transportation Interventions Intervention Not Indicated  Utilities Interventions Intervention Not Indicated        Care Coordination Interventions:  Yes, provided   Follow up plan: Follow up call scheduled for 05/25/22    Encounter Outcome:  Pt. Visit Completed   Arya Boxley L. Lavina Hamman, RN, BSN,  [place] Coordinator Office number [phone]

## 2022-04-20 ENCOUNTER — Ambulatory Visit: Payer: Medicare PPO | Admitting: Physical Therapy

## 2022-04-21 ENCOUNTER — Ambulatory Visit: Payer: Medicare PPO | Admitting: Nurse Practitioner

## 2022-04-21 ENCOUNTER — Encounter: Payer: Self-pay | Admitting: Nurse Practitioner

## 2022-04-21 VITALS — BP 191/77 | HR 75 | Temp 97.5°F | Resp 20 | Ht 61.0 in | Wt 128.0 lb

## 2022-04-21 DIAGNOSIS — E876 Hypokalemia: Secondary | ICD-10-CM | POA: Diagnosis not present

## 2022-04-21 DIAGNOSIS — Z23 Encounter for immunization: Secondary | ICD-10-CM

## 2022-04-21 DIAGNOSIS — R413 Other amnesia: Secondary | ICD-10-CM

## 2022-04-21 DIAGNOSIS — E559 Vitamin D deficiency, unspecified: Secondary | ICD-10-CM

## 2022-04-21 DIAGNOSIS — E782 Mixed hyperlipidemia: Secondary | ICD-10-CM | POA: Diagnosis not present

## 2022-04-21 DIAGNOSIS — K2270 Barrett's esophagus without dysplasia: Secondary | ICD-10-CM

## 2022-04-21 DIAGNOSIS — G250 Essential tremor: Secondary | ICD-10-CM | POA: Diagnosis not present

## 2022-04-21 DIAGNOSIS — F419 Anxiety disorder, unspecified: Secondary | ICD-10-CM

## 2022-04-21 DIAGNOSIS — K5790 Diverticulosis of intestine, part unspecified, without perforation or abscess without bleeding: Secondary | ICD-10-CM

## 2022-04-21 DIAGNOSIS — I10 Essential (primary) hypertension: Secondary | ICD-10-CM

## 2022-04-21 MED ORDER — POTASSIUM CHLORIDE ER 10 MEQ PO TBCR: 10.0000 meq | EXTENDED_RELEASE_TABLET | Freq: Every day | ORAL | 1 refills | Status: DC

## 2022-04-21 MED ORDER — TOPIRAMATE 25 MG PO TABS: 25.0000 mg | ORAL_TABLET | Freq: Two times a day (BID) | ORAL | 2 refills | Status: DC

## 2022-04-21 MED ORDER — FAMOTIDINE 20 MG PO TABS: 20.0000 mg | ORAL_TABLET | Freq: Two times a day (BID) | ORAL | 1 refills | Status: DC | PRN

## 2022-04-21 MED ORDER — CLONAZEPAM 0.5 MG PO TABS: ORAL_TABLET | ORAL | 5 refills | Status: DC

## 2022-04-21 MED ORDER — LISINOPRIL 20 MG PO TABS: 20.0000 mg | ORAL_TABLET | Freq: Every day | ORAL | 1 refills | Status: DC

## 2022-04-21 NOTE — Progress Notes (Addendum)
Subjective:    Patient ID: Rachel Walton, female    DOB: [birth year], 88 y.o.   MRN: [id number]   Chief Complaint: Medical Management of Chronic Issues    HPI:  Rachel Walton is a 88 y.o. who identifies as a female who was assigned female at birth.   Social history: Lives with: husband Work history: retired   Scientist, forensic in today for follow up of the following chronic medical issues:  1. Essential hypertension No c/o chest pain, sob, or headache. Does not check BP at home. Pt does state she is out of her clonazepam and is feeling a little anxious today and that's why her BP is high. BP Readings from Last 3 Encounters:  04/21/22 (!) 191/77  04/06/22 (!) 155/72  04/01/22 (!) 170/77     2. Mixed hyperlipidemia Does try to watch her diet; no dedicated exercise. Lab Results  Component Value Date   CHOL 265 (H) 03/17/2022   HDL 89 03/17/2022   LDLCALC 137 (H) 03/17/2022   TRIG 222 (H) 03/17/2022   CHOLHDL 3.0 03/17/2022   The ASCVD Risk score (Arnett DK, et al., 2019) failed to calculate for the following reasons:   The 2019 ASCVD risk score is only valid for ages 86 to 45   3. Diverticulosis No recent flare ups  4. Barrett's esophagus without dysplasia Takes pepcid daily, working well for heartburn.  5. Essential tremor Has head tremor, states this is like it always is; does not see neurology at this time  6. Memory difficulties Does not feel it is any worse; states she feels like she does fairly well considering her age.  7. Hypokalemia Denies any muscle cramps Lab Results  Component Value Date   K 3.8 03/17/2022     8. Vitamin D deficiency Takes daily supplement  9. Anxiety Doing better back on her clonazepam, only takes 1/2 tab at a time; but has been out for several days.    04/21/2022    1:59 PM 04/06/2022   11:11 AM 04/01/2022    2:01 PM 03/17/2022    3:21 PM  GAD 7 : Generalized Anxiety Score  Nervous, Anxious, on Edge _0 Control/stop  worrying 0 1 0 0  Worry too much - different things 0 _1 Trouble relaxing 1 1 0 0  Restless 0 1 0 0  Easily annoyed or irritable 1 1 0 0  Afraid - awful might happen 0 1 0 0  Total GAD 7 Score _2 Anxiety Difficulty Not difficult at all Somewhat difficult Somewhat difficult Somewhat difficult       04/21/2022    1:59 PM 04/06/2022    3:33 PM 04/06/2022   11:11 AM 04/01/2022    2:01 PM 03/17/2022    3:21 PM  Depression screen PHQ 2/9  Decreased Interest _3 Down, Depressed, Hopeless 0 _4 PHQ - 2 Score _5 Altered sleeping 0 _6 0  Tired, decreased energy _7 Change in appetite 0 1 1 0 0  Feeling bad or failure about yourself  0 1 1 0 0  Trouble concentrating 0 1 1 0 0  Moving slowly or fidgety/restless 0 0 0 0 0  Suicidal thoughts 0 0 0 0 0  PHQ-9 Score _8 Difficult doing work/chores Not difficult at all Not difficult  at all Not difficult at all Not difficult at all Not difficult at all      New complaints: None today  Allergies  Allergen Reactions   Alendronate Sodium Other (See Comments)    Chest pain   Lexapro [Escitalopram Oxalate] Other (See Comments)    Makes lymphangitic colitis worse   Amoxicillin Other (See Comments)    diarrhea   Aspirin Other (See Comments)    Per the patient, made the gastric ulcer worse.   Clindamycin/Lincomycin Other (See Comments)    unknown   Keflex [Cephalexin] Other (See Comments)    unknown   Levofloxacin Other (See Comments)    Insomnia and headache   Prednisone Other (See Comments)    Nervousness   Sudafed [Pseudoephedrine Hcl] Other (See Comments)    insomnia   Sulfa Antibiotics Hives   Toprol Xl [Metoprolol Succinate] Other (See Comments)    Unknown reaction   Trimethoprim     Unknown reaction   Zithromax [Azithromycin] Rash   Outpatient Encounter Medications as of 04/21/2022  Medication Sig   acetaminophen (TYLENOL) 500 MG tablet Take 500 mg by mouth every 6 (six)  hours as needed for mild pain.   albuterol (VENTOLIN HFA) 108 (90 Base) MCG/ACT inhaler Inhale 2 puffs into the lungs every 6 (six) hours as needed.   CALCIUM PO Take by mouth daily.   Cholecalciferol (VITAMIN D) 50 MCG (2000 UT) CAPS Take 2,000 Units by mouth daily.   clonazePAM (KLONOPIN) 0.5 MG tablet TAKE 1/2 TO 1 TABLET AS NEEDED   famotidine (PEPCID) 20 MG tablet Take 1 tablet (20 mg total) by mouth 2 (two) times daily as needed.   fluticasone (FLONASE) 50 MCG/ACT nasal spray Place 2 sprays into both nostrils daily.   lisinopril (ZESTRIL) 20 MG tablet Take 1 tablet (20 mg total) by mouth daily.   loratadine (CLARITIN) 10 MG tablet Take 1 tablet (10 mg total) by mouth daily.   meloxicam (MOBIC) 15 MG tablet Take 15 mg by mouth daily.   mesalamine (LIALDA) 1.2 g EC tablet TAKE 2 TABLETS BY MOUTH EVERY MORNING WITH BREAKFAST   Multiple Vitamins-Minerals (PRESERVISION AREDS PO) Take by mouth.   OVER THE COUNTER MEDICATION Take 1 tablet by mouth daily. Stress B with Zinc   potassium chloride (KLOR-CON) 10 MEQ tablet Take 1 tablet (10 mEq total) by mouth daily.   topiramate (TOPAMAX) 25 MG tablet Take 1 tablet (25 mg total) by mouth 2 (two) times daily.   vitamin B-12 (CYANOCOBALAMIN) 1000 MCG tablet Take 1,000 mcg by mouth daily.   vitamin C (ASCORBIC ACID) 500 MG tablet Take 500 mg by mouth daily.   No facility-administered encounter medications on file as of 04/21/2022.    Past Surgical History:  Procedure Laterality Date   ABDOMINAL HYSTERECTOMY  1980   APPENDECTOMY     BACK SURGERY     BREAST BIOPSY Left    CARPAL TUNNEL RELEASE Left 09/13/2020   Procedure: CARPAL TUNNEL RELEASE;  Surgeon: Roseanne Kaufman, MD;  Location: [facility];  Service: Orthopedics;  Laterality: Left;   CATARACT EXTRACTION W/ INTRAOCULAR LENS  IMPLANT, BILATERAL Bilateral    COLONOSCOPY     ESOPHAGOGASTRODUODENOSCOPY (EGD) WITH ESOPHAGEAL DILATION  X 1   EYE SURGERY     LUMBAR DISC SURGERY     "fragmented  disc"   ORIF RADIAL FRACTURE Left 09/13/2020   Procedure: HARDWARE REMOVAL, OPEN REDUCTION INTERNAL FIXATION LEFT RADIUS;  Surgeon: Roseanne Kaufman, MD;  Location: [facility];  Service: Orthopedics;  Laterality: Left;  Block with IV sedation   POLYPECTOMY     SQUAMOUS CELL CARCINOMA EXCISION     nose tip    Family History  Problem Relation Age of Onset   Stroke Mother    Heart disease Father        MI   Heart attack Father    Cancer Sister        Brain tumor   Hypertension Sister    Osteoporosis Sister    Hypertension Sister    Breast cancer Sister 39   Hypertension Sister    Osteoporosis Sister    Hypertension Sister    Hypertension Sister    Stroke Sister    Hypertension Sister    Hypertension Sister    Colon cancer Brother    Heart disease Brother    Diabetes Son 40   Diabetes Son 71   Brain cancer Son        deceased   Cancer Son        brain   Diabetes Maternal Uncle    Colon cancer Other        nephew   Thyroid cancer Other        niece   Breast cancer Other    Breast cancer Other    Breast cancer Other    Colon polyps Neg Hx    Esophageal cancer Neg Hx    Rectal cancer Neg Hx    Stomach cancer Neg Hx       Controlled substance contract: n/a     Review of Systems  Constitutional:  Negative for diaphoresis and fatigue.  Eyes:  Negative for pain.  Respiratory:  Negative for chest tightness and shortness of breath.   Cardiovascular:  Negative for chest pain, palpitations and leg swelling.  Endocrine: Negative for cold intolerance, heat intolerance, polydipsia and polyuria.  Genitourinary:  Negative for difficulty urinating.  Musculoskeletal:  Positive for arthralgias.  Neurological:  Positive for tremors. Negative for weakness, numbness and headaches.  Hematological:  Negative for adenopathy. Does not bruise/bleed easily.  All other systems reviewed and are negative.      Objective:   Physical Exam Vitals and nursing note reviewed.   Constitutional:      General: She is not in acute distress.    Appearance: Normal appearance. She is well-developed. She is not ill-appearing.  HENT:     Head: Normocephalic and atraumatic.     Right Ear: Tympanic membrane normal.     Left Ear: Tympanic membrane normal.     Nose: Nose normal.     Mouth/Throat:     Mouth: Mucous membranes are moist.     Pharynx: Oropharynx is clear.  Eyes:     Conjunctiva/sclera: Conjunctivae normal.     Pupils: Pupils are equal, round, and reactive to light.  Neck:     Vascular: No carotid bruit.  Cardiovascular:     Rate and Rhythm: Normal rate. Rhythm irregular.     Pulses: Normal pulses.     Heart sounds: Normal heart sounds.     Comments: PAC's audible Pulmonary:     Effort: Pulmonary effort is normal. No respiratory distress.     Breath sounds: Normal breath sounds. No wheezing, rhonchi or rales.  Abdominal:     General: Bowel sounds are normal. There is no distension.     Palpations: Abdomen is soft.     Tenderness: There is no abdominal tenderness.  Musculoskeletal:        General: Normal range  of motion.     Cervical back: Normal range of motion. No tenderness.     Right lower leg: No edema.     Left lower leg: No edema.  Lymphadenopathy:     Cervical: No cervical adenopathy.  Skin:    General: Skin is warm and dry.     Capillary Refill: Capillary refill takes less than 2 seconds.  Neurological:     General: No focal deficit present.     Mental Status: She is alert and oriented to person, place, and time.     Motor: Tremor present.     Comments: Head tremors  Psychiatric:        Mood and Affect: Mood is anxious.        Behavior: Behavior normal.     BP (!) 191/77   Pulse 75   Temp (!) 97.5 F (36.4 C) (Temporal)   Resp 20   Ht _0  (1.549 m)   Wt 128 lb (58.1 kg)   SpO2 99%   BMI 24.19 kg/m        Assessment & Plan:   Rachel Walton comes in today with chief complaint of Medical Management of Chronic  Issues   Diagnosis and orders addressed:  1. Essential hypertension Low sodium diet - lisinopril (ZESTRIL) 20 MG tablet; Take 1 tablet (20 mg total) by mouth daily.  Dispense: 90 tablet; Refill: 1 - CBC with Differential/Platelet - CMP14+EGFR  2. Mixed hyperlipidemia Low fat diet - Lipid panel  3. Diverticulosis Avoid foods that cause flare ups  4. Barrett's esophagus without dysplasia Avoid spicy foods Avoid eating 2 hours before bed - famotidine (PEPCID) 20 MG tablet; Take 1 tablet (20 mg total) by mouth 2 (two) times daily as needed.  Dispense: 180 tablet; Refill: 1  5. Essential tremor - topiramate (TOPAMAX) 25 MG tablet; Take 1 tablet (25 mg total) by mouth 2 (two) times daily.  Dispense: 60 tablet; Refill: 2  6. Memory difficulties Read daily Crosswords puzzles, sudoku Memory recall exercises  7. Hypokalemia Labs pending - potassium chloride (KLOR-CON) 10 MEQ tablet; Take 1 tablet (10 mEq total) by mouth daily.  Dispense: 90 tablet; Refill: 1  8. Vitamin D deficiency Continue daily supplement  9. Anxiety Stress management - clonazePAM (KLONOPIN) 0.5 MG tablet; TAKE 1/2 TO 1 TABLET AS NEEDED  Dispense: 30 tablet; Refill: 5   Labs pending Health Maintenance reviewed Diet and exercise encouraged  Follow up plan: 6 months  Collene Leyden, FNP student    [place], [place]

## 2022-04-21 NOTE — Patient Instructions (Signed)

## 2022-04-22 LAB — CBC WITH DIFFERENTIAL/PLATELET
Basophils Absolute: 0.1 10*3/uL (ref 0.0–0.2)
Basos: 1 %
EOS (ABSOLUTE): 0.2 10*3/uL (ref 0.0–0.4)
Eos: 3 %
Hematocrit: 35.8 % (ref 34.0–46.6)
Hemoglobin: 11.9 g/dL (ref 11.1–15.9)
Immature Grans (Abs): 0 10*3/uL (ref 0.0–0.1)
Immature Granulocytes: 0 %
Lymphocytes Absolute: 2.4 10*3/uL (ref 0.7–3.1)
Lymphs: 39 %
MCH: 29.9 pg (ref 26.6–33.0)
MCHC: 33.2 g/dL (ref 31.5–35.7)
MCV: 90 fL (ref 79–97)
Monocytes Absolute: 0.3 10*3/uL (ref 0.1–0.9)
Monocytes: 6 %
Neutrophils Absolute: 3.1 10*3/uL (ref 1.4–7.0)
Neutrophils: 51 %
Platelets: 273 10*3/uL (ref 150–450)
RBC: 3.98 x10E6/uL (ref 3.77–5.28)
RDW: 12.6 % (ref 11.7–15.4)
WBC: 6.1 10*3/uL (ref 3.4–10.8)

## 2022-04-22 LAB — CMP14+EGFR
ALT: 10 IU/L (ref 0–32)
AST: 16 IU/L (ref 0–40)
Albumin/Globulin Ratio: 1.8 (ref 1.2–2.2)
Albumin: 4.1 g/dL (ref 3.6–4.6)
Alkaline Phosphatase: 84 IU/L (ref 44–121)
BUN/Creatinine Ratio: 14 (ref 12–28)
BUN: 14 mg/dL (ref 10–36)
Bilirubin Total: 0.2 mg/dL (ref 0.0–1.2)
CO2: 23 mmol/L (ref 20–29)
Calcium: 8.9 mg/dL (ref 8.7–10.3)
Chloride: 101 mmol/L (ref 96–106)
Creatinine, Ser: 1.01 mg/dL — ABNORMAL HIGH (ref 0.57–1.00)
Globulin, Total: 2.3 g/dL (ref 1.5–4.5)
Glucose: 91 mg/dL (ref 70–99)
Potassium: 4.3 mmol/L (ref 3.5–5.2)
Sodium: 138 mmol/L (ref 134–144)
Total Protein: 6.4 g/dL (ref 6.0–8.5)
eGFR: 53 mL/min/{1.73_m2} — ABNORMAL LOW (ref >59)

## 2022-04-22 LAB — LIPID PANEL
Chol/HDL Ratio: 2.8 ratio (ref 0.0–4.4)
Cholesterol, Total: 272 mg/dL — ABNORMAL HIGH (ref 100–199)
HDL: 97 mg/dL (ref >39)
LDL Chol Calc (NIH): 146 mg/dL — ABNORMAL HIGH (ref 0–99)
Triglycerides: 170 mg/dL — ABNORMAL HIGH (ref 0–149)
VLDL Cholesterol Cal: 29 mg/dL (ref 5–40)

## 2022-05-08 ENCOUNTER — Encounter: Payer: Self-pay | Admitting: *Deleted

## 2022-05-08 NOTE — Patient Instructions (Signed)
Visit Information  Thank you for taking time to visit with me today. Please don't hesitate to contact me if I can be of assistance to you.   Following are the goals we discussed today:   Goals Addressed               This Visit's Progress     Patient Stated     Pain of right leg (THN) (pt-stated)   Not on track     Care Coordination Interventions: Reviewed provider established plan for pain management Discussed importance of adherence to all scheduled medical appointments Discussed use of relaxation techniques and/or diversional activities to assist with pain reduction (distraction, imagery, relaxation, massage, acupressure, TENS, heat, and cold application Screening for signs and symptoms of depression related to chronic disease state  Assessed social determinant of health barriers Discussed bone density test Discussed use of medications for pain        Our next appointment is by telephone on 05/25/22 at 2 :15 pm  Please call the care guide team at [phone] if you need to cancel or reschedule your appointment.   If you are experiencing a Mental Health or [place] or need someone to talk to, please call the Suicide and Crisis Lifeline: 988 call the [place] National Suicide Prevention Lifeline: 6670226443 or TTY: (320) 146-9294 TTY 313-846-4214) to talk to a trained counselor call 1-800-273-TALK (toll free, 24 hour hotline) call the [facility]: 9840936381 call 911   Patient verbalizes understanding of instructions and care plan provided today and agrees to view in [facility]. Active MyChart status and patient understanding of how to access instructions and care plan via MyChart confirmed with patient.     The patient has been provided with contact information for the care management team and has been advised to call with any health related questions or concerns.    Kimra Kantor L. Lavina Hamman, RN, BSN, [place] Coordinator Office number [phone]

## 2022-05-21 DIAGNOSIS — M25551 Pain in right hip: Secondary | ICD-10-CM | POA: Diagnosis not present

## 2022-05-22 ENCOUNTER — Other Ambulatory Visit: Payer: Self-pay | Admitting: Nurse Practitioner

## 2022-05-25 ENCOUNTER — Ambulatory Visit: Payer: Self-pay

## 2022-05-25 NOTE — Patient Outreach (Signed)
  Care Coordination   05/25/2022 Name: Rachel Walton MRN: [id number] DOB: Dec 14, 1929   Care Coordination Outreach Attempts:  An unsuccessful telephone outreach was attempted for a scheduled appointment today. Telephone call attempt x 2 to listed home number.  Unable to reach or leave voice message due to phone only ringing.   Follow Up Plan:  Additional outreach attempts will be made to offer the patient care coordination information and services.   Encounter Outcome:  No Answer   Care Coordination Interventions:  No, not indicated    Quinn Plowman [facility] [phone] direct line

## 2022-07-07 ENCOUNTER — Telehealth: Payer: Self-pay | Admitting: Internal Medicine

## 2022-07-07 NOTE — Telephone Encounter (Signed)
Inbound call from patient, would like sooner appointment with Dr. Hilarie Fredrickson. Patient is requesting to have her esophagus stretched again. Please advise.

## 2022-07-07 NOTE — Telephone Encounter (Signed)
See note below. Does pt need to have an OV or could she be a direct EGD with dilation?

## 2022-07-08 NOTE — Telephone Encounter (Signed)
While I anticipate she will be ok for EGD, I think best for her to be seen and then get it scheduled. It has been over 1 yr since she was here, though we know her well. APP visit (or me if available before APP)

## 2022-07-09 ENCOUNTER — Encounter: Payer: Self-pay | Admitting: Radiology

## 2022-07-09 NOTE — Telephone Encounter (Signed)
Spoke with pt and she is scheduled to see Nicoletta Ba PA 07/23/22 at 1:30pm. Pt aware of appt.

## 2022-07-23 ENCOUNTER — Ambulatory Visit: Payer: Medicare PPO | Admitting: Physician Assistant

## 2022-09-02 ENCOUNTER — Ambulatory Visit: Payer: Medicare PPO | Admitting: Physician Assistant

## 2022-09-02 ENCOUNTER — Encounter: Payer: Self-pay | Admitting: Physician Assistant

## 2022-09-02 DIAGNOSIS — K2270 Barrett's esophagus without dysplasia: Secondary | ICD-10-CM

## 2022-09-02 DIAGNOSIS — Z8719 Personal history of other diseases of the digestive system: Secondary | ICD-10-CM

## 2022-09-02 DIAGNOSIS — K219 Gastro-esophageal reflux disease without esophagitis: Secondary | ICD-10-CM

## 2022-09-02 DIAGNOSIS — K52832 Lymphocytic colitis: Secondary | ICD-10-CM

## 2022-09-02 DIAGNOSIS — R1310 Dysphagia, unspecified: Secondary | ICD-10-CM

## 2022-09-02 DIAGNOSIS — K5730 Diverticulosis of large intestine without perforation or abscess without bleeding: Secondary | ICD-10-CM

## 2022-09-02 MED ORDER — MESALAMINE 1.2 G PO TBEC: 1.2000 g | DELAYED_RELEASE_TABLET | Freq: Two times a day (BID) | ORAL | 11 refills | Status: DC

## 2022-09-02 MED ORDER — OMEPRAZOLE 40 MG PO CPDR: 40.0000 mg | DELAYED_RELEASE_CAPSULE | Freq: Every day | ORAL | 11 refills | Status: DC

## 2022-09-02 NOTE — Patient Instructions (Addendum)
______________________________________________________  If your blood pressure at your visit was 140/90 or greater, please contact your primary care physician to follow up on this. _______________________________________________________  If you are age 87 or older, your body mass index should be between 23-30. Your Body mass index is 24.09 kg/m. If this is out of the aforementioned range listed, please consider follow up with your Primary Care Provider. ________________________________________________________  The [facility] providers would like to encourage you to use [facility] to communicate with providers for non-urgent requests or questions.  Due to long hold times on the telephone, sending your provider a message by [facility] may be a faster and more efficient way to get a response.  Please allow 48 business hours for a response.  Please remember that this is for non-urgent requests.  _______________________________________________________  We have sent the following medications to your pharmacy for you to pick up at your convenience:  omeprazole  one capsule daily 30 minutes prior to breakfast meal each day. Lialda 1.2 grams one tablet twice daily   You have been scheduled for an endoscopy. Please follow written instructions given to you at your visit today. If you use inhalers (even only as needed), please bring them with you on the day of your procedure.  Due to recent changes in healthcare laws, you may see the results of your imaging and laboratory studies on MyChart before your provider has had a chance to review them.  We understand that in some cases there may be results that are confusing or concerning to you. Not all laboratory results come back in the same time frame and the provider may be waiting for multiple results in order to interpret others.  Please give [place] 48 hours in order for your provider to thoroughly review all the results before contacting the office for  clarification of your results.   Thank you for entrusting me with your care and choosing [facility].  Amy Esterwood, PA-C

## 2022-09-02 NOTE — Progress Notes (Signed)
Subjective:    Patient ID: Rachel Walton, female    DOB: 1929/09/01, 89 y.o.   MRN: [id number]  HPI [name]  is a pleasant 89 year old white female, established with Dr. Rhea Belton who comes in today with complaints of recent dysphagia. She relates that she had an episode within the past couple of weeks while eating a chicken sandwich at [facility] of food becoming lodged and having to regurgitate it all back up.  She says she has had a couple of similar episodes recently though that was the worst.  No odynophagia.  No current complaints of heartburn or indigestion.  On review of her meds it sounds as if she has been taking Pepcid twice a day 20 mg and not omeprazole 40 mg p.o. daily which had previously been prescribed. She had 2 EGDs in 2015 per Dr. Juanda Chance initially was found to have a large deep ulcer at the GE junction and nonobstructing esophageal stricture.  Biopsy at that time showed inflamed ulcerated mucosa with metaplasia consistent with Barrett's. On repeat EGD 2 months later the ulcer had 95% healed, and the stricture was dilated to 16 mm, also noted to have a small hiatal hernia. Last EGD November 2022 per Dr. Rhea Belton with finding of nonobstructing Schatzki's ring at the GE junction and a 3 cm hiatal hernia she was not dilated at that time.  She also has diagnosis of lymphocytic colitis and has been maintained on low-dose Lialda 1.2 g , 2 daily.  She is asking whether she will need to stay on that medication forever.  She is not having any problems with diarrhea but says she does have frequent bowel movements.  She describes having formed stools but also passing a small amount of stool whenever she sits down to urinate a few times per day.  No melena or hematochezia, no complaints of abdominal pain does have some gassiness.    Review of Systems Pertinent positive and negative review of systems were noted in the above HPI section.  All other review of systems was otherwise negative.    Outpatient Encounter Medications as of 09/02/2022  Medication Sig   acetaminophen (TYLENOL) 500 MG tablet Take 500 mg by mouth every 6 (six) hours as needed for mild pain.   CALCIUM PO Take by mouth daily.   Cholecalciferol (VITAMIN D) 50 MCG (2000 UT) CAPS Take 2,000 Units by mouth daily.   clonazePAM (KLONOPIN) 0.5 MG tablet TAKE 1/2 TO 1 TABLET AS NEEDED   fluticasone (FLONASE) 50 MCG/ACT nasal spray Place 2 sprays into both nostrils daily.   lisinopril (ZESTRIL) 20 MG tablet Take 1 tablet (20 mg total) by mouth daily.   loratadine (CLARITIN) 10 MG tablet Take 1 tablet (10 mg total) by mouth daily.   meloxicam (MOBIC) 7.5 MG tablet Take 7.5 mg by mouth daily.   Multiple Vitamins-Minerals (PRESERVISION AREDS PO) Take by mouth.   naphazoline-pheniramine (NAPHCON-A) 0.025-0.3 % ophthalmic solution Place 1 drop into both eyes as needed.   potassium chloride (KLOR-CON) 10 MEQ tablet Take 1 tablet (10 mEq total) by mouth daily.   [DISCONTINUED] famotidine (PEPCID) 20 MG tablet Take 1 tablet (20 mg total) by mouth 2 (two) times daily as needed. (Patient taking differently: Take 20 mg by mouth as needed.)   [DISCONTINUED] meloxicam (MOBIC) 15 MG tablet Take 15 mg by mouth daily.   [DISCONTINUED] mesalamine (LIALDA) 1.2 g EC tablet TAKE 2 TABLETS BY MOUTH EVERY MORNING WITH BREAKFAST   [DISCONTINUED] omeprazole (PRILOSEC) 40 MG capsule Take  40 mg by mouth daily.   mesalamine (LIALDA) 1.2 g EC tablet Take 1 tablet (1.2 g total) by mouth 2 (two) times daily.   omeprazole (PRILOSEC) 40 MG capsule Take 1 capsule (40 mg total) by mouth daily. Take 30 minutes before breakfast meal   [DISCONTINUED] albuterol (VENTOLIN HFA) 108 (90 Base) MCG/ACT inhaler Inhale 2 puffs into the lungs every 6 (six) hours as needed.   [DISCONTINUED] OVER THE COUNTER MEDICATION Take 1 tablet by mouth daily. Stress B with Zinc   [DISCONTINUED] topiramate (TOPAMAX) 25 MG tablet Take 1 tablet (25 mg total) by mouth 2 (two) times  daily.   [DISCONTINUED] vitamin B-12 (CYANOCOBALAMIN) 1000 MCG tablet Take 1,000 mcg by mouth daily.   [DISCONTINUED] vitamin C (ASCORBIC ACID) 500 MG tablet Take 500 mg by mouth daily.   No facility-administered encounter medications on file as of 09/02/2022.   Allergies  Allergen Reactions   Alendronate Sodium Other (See Comments)    Chest pain   Lexapro [Escitalopram Oxalate] Other (See Comments)    Makes lymphangitic colitis worse   Amoxicillin Other (See Comments)    diarrhea   Aspirin Other (See Comments)    Per the patient, made the gastric ulcer worse.   Budesonide Other (See Comments)    Blurry vision, shaking inside her body   Clindamycin/Lincomycin Other (See Comments)    unknown   Keflex [Cephalexin] Other (See Comments)    unknown   Levofloxacin Other (See Comments)    Insomnia and headache   Prednisone Other (See Comments)    Nervousness   Sudafed [Pseudoephedrine Hcl] Other (See Comments)    insomnia   Sulfa Antibiotics Hives   Toprol Xl [Metoprolol Succinate] Other (See Comments)    Unknown reaction   Trimethoprim     Unknown reaction   Zithromax [Azithromycin] Rash   Patient Active Problem List   Diagnosis Date Noted   Memory difficulties 01/18/2021   Anxiety 01/18/2021   Vitamin D deficiency 03/06/2020   Osteopenia with high risk of fracture 10/21/2016   Essential tremor 05/20/2016   Age-related macular degeneration, dry, both eyes 04/07/2016   Barrett's esophagus 12/20/2015   Hypokalemia 10/09/2015   Blepharoptosis, acquired, 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   Seasonal allergies 12/11/2013   Essential hypertension 12/11/2013   Amblyopia ex anopsia of right eye 12/07/2013   Degenerative drusen 05/05/2013   Mixed hyperlipidemia 11/21/2012   Colon polyp    Kyphosis    Atrophic vaginitis    Diverticulosis    Social History   Socioeconomic History   Marital status: Married    Spouse name: Chrissie Noa   Number of children: 3   Years of education: Not on file    Highest education level: Not on file  Occupational History   Occupation: Retired  Tobacco Use   Smoking status: Never   Smokeless tobacco: Never  Vaping Use   Vaping Use: Never used  Substance and Sexual Activity   Alcohol use: No    Alcohol/week: 0.0 standard drinks of alcohol   Drug use: No   Sexual activity: Not Currently  Other Topics Concern   Not on file  Social History Narrative   One son in [place]   One son, Tomma Lightning lives nearby and visits almost daily   Social Determinants of Health   Financial Resource Strain: Low Risk  (03/17/2021)   Overall Financial Resource Strain (CARDIA)    Difficulty of Paying Living Expenses: Not hard at all  Food Insecurity: No Food Insecurity (04/17/2022)   Hunger Vital  Sign    Worried About Programme researcher, broadcasting/film/video in the Last Year: Never true    Ran Out of Food in the Last Year: Never true  Transportation Needs: No Transportation Needs (04/17/2022)   PRAPARE - Administrator, Civil Service (Medical): No    Lack of Transportation (Non-Medical): No  Physical Activity: Inactive (03/17/2021)   Exercise Vital Sign    Days of Exercise per Week: 0 days    Minutes of Exercise per Session: 0 min  Stress: Stress Concern Present (03/17/2021)   [employer] of Occupational Health - Occupational Stress Questionnaire    Feeling of Stress : To some extent  Social Connections: Socially Integrated (03/17/2021)   Social Connection and Isolation Panel [NHANES]    Frequency of Communication with Friends and Family: More than three times a week    Frequency of Social Gatherings with Friends and Family: More than three times a week    Attends Religious Services: 1 to 4 times per year    Active Member of [employer] or Organizations: Yes    Attends Banker Meetings: 1 to 4 times per year    Marital Status: Married  Catering manager Violence: Not At Risk (04/17/2022)   Humiliation, Afraid, Rape, and Kick questionnaire    Fear of  Current or Ex-Partner: No    Emotionally Abused: No    Physically Abused: No    Sexually Abused: No    Ms. Jeanpaul's family history includes Brain cancer in her son; Breast cancer in some other family members; Breast cancer (age of onset: 28) in her sister; Cancer in her sister and son; Colon cancer in her brother and another family member; Diabetes in her maternal uncle; Diabetes (age of onset: 33) in her son; Diabetes (age of onset: 23) in her son; Heart attack in her father; Heart disease in her brother and father; Hypertension in her sister, sister, sister, sister, sister, sister, and sister; Osteoporosis in her sister and sister; Stroke in her mother and sister; Thyroid cancer in an other family member.      Objective:    Vitals:   09/02/22 1502  BP: 130/70  Pulse: 76    Physical Exam Well-developed well-nourished elderly  WF  in no acute distress.  Height, Weight1 27 , BMI 24.09  accompanied by husband  HEENT; nontraumatic normocephalic, EOMI, PE R LA, sclera anicteric. Oropharynx;not examined today Neck; supple, no JVD Cardiovascular; regular rate and rhythm with S1-S2, no murmur rub or gallop Pulmonary; Clear bilaterally Abdomen; soft, nontender, nondistended, no palpable mass or hepatosplenomegaly, bowel sounds are active Rectal;not done Skin; benign exam, no jaundice rash or appreciable lesions Extremities; no clubbing cyanosis or edema skin warm and dry Neuro/Psych; alert and oriented x4, grossly nonfocal mood and affect appropriate , mild tremor       Assessment & Plan:   #67 89 year old white female with intermittent dysphagia over the past couple of months and 1 episode of what sounds like a transient impaction while eating a chicken sandwich in [employer] requiring regurgitation. She does have history of an esophageal stricture at the GE junction requiring dilation in 2015 after healing of the large deep ulcer at the GE junction.  Also carries diagnosis of Barrett's  esophagus. Last EGD 2022 nonobstructing Schatzki's ring at the GE junction and 3 cm hiatal hernia, no dilation done. I suspect her symptoms currently are secondary to recurrent distal esophageal stricture  #2 history of lymphocytic colitis-stable on low-dose Lialda 1.2 g 2  tablets daily #3 diverticulosis #4.  Hypertension   Plan; we discussed soft diet, very careful chewing of meats and breads, cutting meat into small pieces, and sipping liquids between bites  Patient will be scheduled for upper endoscopy with probable esophageal dilation with Dr. Rhea Belton.  Procedure was discussed in detail with the patient including indications risk benefits and she is agreeable to proceed.  I have asked her to stop Pepcid and we will restart omeprazole 40 mg p.o. every morning AC breakfast, new prescription sent  Refill Lialda 1.2, 2 tablets daily x 1 year  Patient's husband who had accompanied her today has been is having issues with constipation and asked for advice about his constipation.  He may go for 5 days between bowel movements.  He says he feels as if he is not eating enough fruits and vegetables.  No complaints of abdominal pain or bleeding. We discussed increasing water intake, increasing fiber in diet with fruits and vegetables and starting MiraLAX 17 g in 8 ounces of water daily, he can titrate the dose depending on his response.  If he continues to have problems I have asked him to call back and make an appointment.Wayne Both Nachman Sundt PA-C 09/02/2022   Cc: Daphine Deutscher, Mary-Margaret, *

## 2022-09-04 NOTE — Progress Notes (Signed)
Addendum: Reviewed and agree with assessment and management plan. Decklyn Hornik M, MD  

## 2022-09-28 ENCOUNTER — Ambulatory Visit (AMBULATORY_SURGERY_CENTER): Payer: Medicare PPO | Admitting: Internal Medicine

## 2022-09-28 ENCOUNTER — Encounter: Payer: Self-pay | Admitting: Internal Medicine

## 2022-09-28 VITALS — BP 120/51 | HR 64 | Temp 97.5°F | Resp 12 | Ht 61.0 in | Wt 127.0 lb

## 2022-09-28 DIAGNOSIS — Z8719 Personal history of other diseases of the digestive system: Secondary | ICD-10-CM | POA: Diagnosis not present

## 2022-09-28 DIAGNOSIS — K222 Esophageal obstruction: Secondary | ICD-10-CM | POA: Diagnosis not present

## 2022-09-28 DIAGNOSIS — R1319 Other dysphagia: Secondary | ICD-10-CM

## 2022-09-28 DIAGNOSIS — I10 Essential (primary) hypertension: Secondary | ICD-10-CM | POA: Diagnosis not present

## 2022-09-28 DIAGNOSIS — R1310 Dysphagia, unspecified: Secondary | ICD-10-CM | POA: Diagnosis not present

## 2022-09-28 DIAGNOSIS — K760 Fatty (change of) liver, not elsewhere classified: Secondary | ICD-10-CM | POA: Diagnosis not present

## 2022-09-28 MED ORDER — SODIUM CHLORIDE 0.9 % IV SOLN: 500.0000 mL | Freq: Once | INTRAVENOUS | Status: DC

## 2022-09-28 NOTE — Op Note (Signed)
[facility] Patient Name: Rachel Walton Procedure Date: 09/28/2022 10:24 AM MRN: [id number] Endoscopist: Beverley Fiedler , MD, [id number] Age: 89 Referring MD:  Date of Birth: 10-Jan-1930 Gender: Female Account #: [id number] Procedure:                Upper GI endoscopy Indications:              Dysphagia, For therapy of esophageal stenosis,                            recent food impaction resolved without intervention Medicines:                Monitored Anesthesia Care Procedure:                Pre-Anesthesia Assessment:                           - Prior to the procedure, a History and Physical                            was performed, and patient medications and                            allergies were reviewed. The patient's tolerance of                            previous anesthesia was also reviewed. The risks                            and benefits of the procedure and the sedation                            options and risks were discussed with the patient.                            All questions were answered, and informed consent                            was obtained. Prior Anticoagulants: The patient has                            taken no anticoagulant or antiplatelet agents. ASA                            Grade Assessment: II - A patient with mild systemic                            disease. After reviewing the risks and benefits,                            the patient was deemed in satisfactory condition to                            undergo the procedure.  After obtaining informed consent, the endoscope was                            passed under direct vision. Throughout the                            procedure, the patient's blood pressure, pulse, and                            oxygen saturations were monitored continuously. The                            Olympus Scope [id number] was introduced through the                            mouth,  and advanced to the second part of duodenum.                            The upper GI endoscopy was accomplished without                            difficulty. The patient tolerated the procedure                            well. Scope In: Scope Out: Findings:                 Two benign-appearing, intrinsic moderate                            (circumferential scarring or stenosis; an endoscope                            may pass) stenoses were found 35 to 36 cm from the                            incisors. The narrowest stenosis measured 1.3 cm                            (inner diameter) x 1 cm (in length). The stenoses                            were traversed. A TTS dilator was passed through                            the scope. Dilation with a 16-17-18 mm balloon                            dilator was performed to 17 mm. The dilation site                            was examined and showed moderate mucosal disruption  and moderate improvement in luminal narrowing.                           A 2 cm hiatal hernia was present.                           The entire examined stomach was normal.                           The examined duodenum was normal. Complications:            No immediate complications. Estimated Blood Loss:     Estimated blood loss was minimal. Impression:               - Benign-appearing esophageal stenoses. Dilated to                            17 mm with balloon.                           - 2 cm hiatal hernia.                           - Normal stomach.                           - Normal examined duodenum.                           - No specimens collected. Recommendation:           - Patient has a contact number available for                            emergencies. The signs and symptoms of potential                            delayed complications were discussed with the                            patient. Return to normal activities  tomorrow.                            Written discharge instructions were provided to the                            patient.                           - Post-dilation diet and then advance diet as                            tolerated per protocol.                           - Continue present medications.                           -  Repeat EGD can be considered in the future as                            needed for dysphagia symptoms. Beverley Fiedler, MD 09/28/2022 10:43:54 AM This report has been signed electronically.

## 2022-09-28 NOTE — Progress Notes (Signed)
Pt resting comfortably. VSS. Airway intact. SBAR complete to RN. All questions answered.   

## 2022-09-28 NOTE — Progress Notes (Signed)
Called to room to assist during endoscopic procedure.  Patient ID and intended procedure confirmed with present staff. Received instructions for my participation in the procedure from the performing physician.  

## 2022-09-28 NOTE — Progress Notes (Signed)
See office note dated 09/02/2022 for details and current H&P  Patient presenting for upper endoscopy for dysphagia and self resolving food impactions Esophageal stricture/Schatzki's ring dilated to 16 mm with [facility] in 2015 with Dr. Juanda Chance  She remains appropriate for upper endoscopy with probable dilation in the [facility] today  The nature of the procedure, as well as the risks, benefits, and alternatives were carefully and thoroughly reviewed with the patient. Ample time for discussion and questions allowed. The patient understood, was satisfied, and agreed to proceed.

## 2022-09-28 NOTE — Patient Instructions (Addendum)
Post Dilation diet -- see handout Continue present medications Handouts/information given for dilation, esophageal stricture and post-dilation diet Rachel Walton is in [place] for primary care, call Dr Rhea Belton if you have any trouble getting in to see her. Her number is [phone]   YOU HAD AN ENDOSCOPIC PROCEDURE TODAY AT THE [facility]:   Refer to the procedure report that was given to you for any specific questions about what was found during the examination.  If the procedure report does not answer your questions, please call your gastroenterologist to clarify.  If you requested that your care partner not be given the details of your procedure findings, then the procedure report has been included in a sealed envelope for you to review at your convenience later.  YOU SHOULD EXPECT: Some feelings of bloating in the abdomen. Passage of more gas than usual.  Walking can help get rid of the air that was put into your GI tract during the procedure and reduce the bloating. If you had a lower endoscopy (such as a colonoscopy or flexible sigmoidoscopy) you may notice spotting of blood in your stool or on the toilet paper. If you underwent a bowel prep for your procedure, you may not have a normal bowel movement for a few days.  Please Note:  You might notice some irritation and congestion in your nose or some drainage.  This is from the oxygen used during your procedure.  There is no need for concern and it should clear up in a day or so.  SYMPTOMS TO REPORT IMMEDIATELY:  Following upper endoscopy (EGD)  Vomiting of blood or coffee ground material  New chest pain or pain under the shoulder blades  Painful or persistently difficult swallowing  New shortness of breath  Fever of 100F or higher  Black, tarry-looking stools For urgent or emergent issues, a gastroenterologist can be reached at any hour by calling [phone]. Do not use MyChart messaging for urgent concerns.    DIET: Post-dilation diet, see handout but then you may proceed to your regular diet.  Drink plenty of fluids but you should avoid alcoholic beverages for 24 hours.  ACTIVITY:  You should plan to take it easy for the rest of today and you should NOT DRIVE or use heavy machinery until tomorrow (because of the sedation medicines used during the test).    FOLLOW UP: Our staff will call the number listed on your records the next business day following your procedure.  We will call around 7:15- 8:00 am to check on you and address any questions or concerns that you may have regarding the information given to you following your procedure. If we do not reach you, we will leave a message.     SIGNATURES/CONFIDENTIALITY: You and/or your care partner have signed paperwork which will be entered into your electronic medical record.  These signatures attest to the fact that that the information above on your After Visit Summary has been reviewed and is understood.  Full responsibility of the confidentiality of this discharge information lies with you and/or your care-partner.

## 2022-09-29 ENCOUNTER — Telehealth: Payer: Self-pay

## 2022-09-29 NOTE — Telephone Encounter (Signed)
Follow up call to pt, no answer.  

## 2022-09-30 NOTE — Telephone Encounter (Signed)
Patient called, states she believes she lost her glasses and would like to see if someone found any.

## 2022-10-01 ENCOUNTER — Telehealth: Payer: Self-pay | Admitting: *Deleted

## 2022-10-01 NOTE — Telephone Encounter (Signed)
No glasses were found. Checked with the front desk as well. Returned the patient's phone call. No answer or VM available.

## 2022-10-07 ENCOUNTER — Telehealth: Payer: Self-pay | Admitting: Internal Medicine

## 2022-10-07 NOTE — Telephone Encounter (Signed)
Inbound call from patient states she was previously referred to a primary from provider. Would like a f/u call from nurse.

## 2022-10-07 NOTE — Telephone Encounter (Signed)
Pt states Dr. Rhea Belton was going to speak to "Dr. Karie Schwalbe" in [place] about seeing her. She reports she called to schedule an appt and was told they are not taking new pts. Pt would like for Dr. Rhea Belton to see if he can arrange for her to be seen. Please advise.

## 2022-10-12 ENCOUNTER — Telehealth: Payer: Self-pay

## 2022-10-12 NOTE — Telephone Encounter (Signed)
I sent a note to Dr. Beverely Low to see if she is accepting new patients for primary care JMP

## 2022-10-12 NOTE — Telephone Encounter (Signed)
-----   Message from Beverley Fiedler, MD sent at 10/12/2022  3:24 PM EDT ----- Jae Dire Thanks so much! Vonna Kotyk  ----- Message ----- From: Sheliah Hatch, MD Sent: 10/12/2022   3:20 PM EDT To: Beverley Fiedler, MD; Lenard Simmer, CMA  Hi!  I'm happy to take her.  I don't want her seeing someone she's not confident in.  I'll have our office manager reach out to her and set things up.  Have a great week Jae Dire ----- Message ----- From: Beverley Fiedler, MD Sent: 10/12/2022   2:53 PM EDT To: Sheliah Hatch, MD  Genevieve Norlander I hope you are doing well. This patient is one of my long-term patients who is very sweet and 89 years old. She has lost confidence in her nurse practitioner at [facility] after Dr. Christell Constant retired several years ago Would you be willing to accept her as a new patient? She lives fairly close to your practice. Thanks for considering Vonna Kotyk

## 2022-11-06 ENCOUNTER — Encounter: Payer: Self-pay | Admitting: Family Medicine

## 2022-11-06 ENCOUNTER — Ambulatory Visit: Payer: Medicare PPO | Admitting: Family Medicine

## 2022-11-06 VITALS — BP 116/70 | HR 68 | Temp 97.9°F | Resp 18 | Ht 61.0 in | Wt 128.1 lb

## 2022-11-06 DIAGNOSIS — E559 Vitamin D deficiency, unspecified: Secondary | ICD-10-CM | POA: Diagnosis not present

## 2022-11-06 DIAGNOSIS — E782 Mixed hyperlipidemia: Secondary | ICD-10-CM | POA: Diagnosis not present

## 2022-11-06 DIAGNOSIS — I10 Essential (primary) hypertension: Secondary | ICD-10-CM | POA: Diagnosis not present

## 2022-11-06 DIAGNOSIS — H539 Unspecified visual disturbance: Secondary | ICD-10-CM | POA: Diagnosis not present

## 2022-11-06 LAB — CBC WITH DIFFERENTIAL/PLATELET
Basophils Absolute: 0.1 10*3/uL (ref 0.0–0.1)
Basophils Relative: 1.1 % (ref 0.0–3.0)
Eosinophils Absolute: 0.2 10*3/uL (ref 0.0–0.7)
Eosinophils Relative: 3 % (ref 0.0–5.0)
HCT: 37.2 % (ref 36.0–46.0)
Hemoglobin: 12.3 g/dL (ref 12.0–15.0)
Lymphocytes Relative: 41 % (ref 12.0–46.0)
Lymphs Abs: 2.6 10*3/uL (ref 0.7–4.0)
MCHC: 33 g/dL (ref 30.0–36.0)
MCV: 89.2 fl (ref 78.0–100.0)
Monocytes Absolute: 0.4 10*3/uL (ref 0.1–1.0)
Monocytes Relative: 6.5 % (ref 3.0–12.0)
Neutro Abs: 3 10*3/uL (ref 1.4–7.7)
Neutrophils Relative %: 48.4 % (ref 43.0–77.0)
Platelets: 259 10*3/uL (ref 150.0–400.0)
RBC: 4.18 Mil/uL (ref 3.87–5.11)
RDW: 13.5 % (ref 11.5–15.5)
WBC: 6.2 10*3/uL (ref 4.0–10.5)

## 2022-11-06 LAB — BASIC METABOLIC PANEL
BUN: 9 mg/dL (ref 6–23)
CO2: 28 mEq/L (ref 19–32)
Calcium: 9.2 mg/dL (ref 8.4–10.5)
Chloride: 104 mEq/L (ref 96–112)
Creatinine, Ser: 0.95 mg/dL (ref 0.40–1.20)
GFR: 52.61 mL/min — ABNORMAL LOW (ref >60.00)
Glucose, Bld: 93 mg/dL (ref 70–99)
Potassium: 4 mEq/L (ref 3.5–5.1)
Sodium: 138 mEq/L (ref 135–145)

## 2022-11-06 LAB — LIPID PANEL
Cholesterol: 269 mg/dL — ABNORMAL HIGH (ref 0–200)
HDL: 79.8 mg/dL (ref >39.00)
NonHDL: 188.75
Total CHOL/HDL Ratio: 3
Triglycerides: 222 mg/dL — ABNORMAL HIGH (ref 0.0–149.0)
VLDL: 44.4 mg/dL — ABNORMAL HIGH (ref 0.0–40.0)

## 2022-11-06 LAB — HEPATIC FUNCTION PANEL
ALT: 11 U/L (ref 0–35)
AST: 18 U/L (ref 0–37)
Albumin: 3.9 g/dL (ref 3.5–5.2)
Alkaline Phosphatase: 53 U/L (ref 39–117)
Bilirubin, Direct: 0 mg/dL (ref 0.0–0.3)
Total Bilirubin: 0.4 mg/dL (ref 0.2–1.2)
Total Protein: 6.3 g/dL (ref 6.0–8.3)

## 2022-11-06 LAB — LDL CHOLESTEROL, DIRECT: Direct LDL: 150 mg/dL

## 2022-11-06 LAB — TSH: TSH: 1.33 u[IU]/mL (ref 0.35–5.50)

## 2022-11-06 LAB — VITAMIN D 25 HYDROXY (VIT D DEFICIENCY, FRACTURES): VITD: 37.87 ng/mL (ref 30.00–100.00)

## 2022-11-06 NOTE — Patient Instructions (Signed)
Schedule your complete physical in 6 months We'll notify you of your lab results and make any changes if needed We'll call you to schedule your eye exam w/ Dr Elmer Picker Call with any questions or concerns Stay Safe!  Stay Healthy! Welcome!  We're glad to have you! HAPPY EARLY BIRTHDAY!!!

## 2022-11-06 NOTE — Progress Notes (Signed)
   Subjective:    Patient ID: Rachel Walton, female    DOB: March 26, 1930, 89 y.o.   MRN: [id number]  HPI New to establish.  Previous PCP- [facility]  HTN- chronic problem, on Lisinopril 20mg  daily.  .+ fatigue.  No CP, SOB, HA's, visual changes.    Hyperlipidemia- chronic problem, last LDL 146.  Not currently on medication but was in the past.  No abd pain, N/V.  'glob of black'- pt reports she only sees this at night when she looks into the hallway (where she leaves a light on).  Does not see this during daytime hours.  'it's big'.  Pt has not seen an eye doctor recently.  Has seen Dr Elmer Picker in the past for cataracts.   Review of Systems For ROS see HPI     Objective:   Physical Exam Vitals reviewed.  Constitutional:      General: She is not in acute distress.    Appearance: Normal appearance. She is well-developed. She is not ill-appearing.  HENT:     Head: Normocephalic and atraumatic.  Eyes:     Conjunctiva/sclera: Conjunctivae normal.     Pupils: Pupils are equal, round, and reactive to light.  Neck:     Thyroid: No thyromegaly.  Cardiovascular:     Rate and Rhythm: Normal rate and regular rhythm.     Heart sounds: Normal heart sounds.  Pulmonary:     Effort: Pulmonary effort is normal. No respiratory distress.     Breath sounds: Normal breath sounds.  Abdominal:     General: There is no distension.     Palpations: Abdomen is soft.     Tenderness: There is no abdominal tenderness.  Musculoskeletal:     Cervical back: Normal range of motion and neck supple.  Lymphadenopathy:     Cervical: No cervical adenopathy.  Skin:    General: Skin is warm and dry.  Neurological:     Mental Status: She is alert and oriented to person, place, and time.  Psychiatric:        Mood and Affect: Mood normal.        Behavior: Behavior normal.           Assessment & Plan:  Visual changes- new.  Pt reports seeing a 'large glob of black' when looking into the hallway from  her bedroom.  She leaves a light on in the hallway at night.  She only sees this at night, never during the day.  Does have hx of cataracts but has not seen eye doctor recently.  Does not sound like a visual hallucination but rather something to do w/ that particular scenario that may represent a visual field deficit or other eye concern.  Referral placed for ophthalmology.  Pt expressed understanding and is in agreement w/ plan.

## 2022-11-11 ENCOUNTER — Telehealth: Payer: Self-pay

## 2022-11-11 NOTE — Telephone Encounter (Signed)
-----   Message from Alveria Apley, NP sent at 11/11/2022 12:41 PM EDT ----- I am covering for Dr. Beverely Low while she is out of office. Your cholesterol and triglycerides are elevated, recommend to increase intake of fresh fruits and vegetables, increase intake of lean proteins. Bake, broil, or grill foods. Avoid fried, greasy, and fatty foods. Avoid fast foods. Increase intake of fiber-rich whole grains. Exercise encouraged - at least 150 minutes per week and advance as tolerated. Kidney function is slightly decreased, recommend to hydrate with water. All other labs are stable. No change in medication indicated at this time.

## 2022-11-11 NOTE — Telephone Encounter (Signed)
Unable to reach pt no VM set up and no answer . Sent a my chart message

## 2022-11-13 ENCOUNTER — Other Ambulatory Visit: Payer: Self-pay | Admitting: Nurse Practitioner

## 2022-11-13 DIAGNOSIS — F419 Anxiety disorder, unspecified: Secondary | ICD-10-CM

## 2022-11-13 NOTE — Telephone Encounter (Signed)
Pt has been informed.

## 2022-11-13 NOTE — Telephone Encounter (Signed)
Patient had appointment 11/05/21 Last refill 04/22/23 #30, 5 refills

## 2022-11-20 ENCOUNTER — Ambulatory Visit: Payer: Medicare PPO | Admitting: Nurse Practitioner

## 2022-11-23 ENCOUNTER — Encounter: Payer: Self-pay | Admitting: Family Medicine

## 2022-11-23 ENCOUNTER — Ambulatory Visit: Payer: Medicare PPO | Admitting: Family Medicine

## 2022-11-23 VITALS — BP 106/68 | HR 60 | Temp 97.8°F | Resp 18 | Ht 61.0 in | Wt 124.4 lb

## 2022-11-23 DIAGNOSIS — R490 Dysphonia: Secondary | ICD-10-CM

## 2022-11-23 DIAGNOSIS — M79621 Pain in right upper arm: Secondary | ICD-10-CM | POA: Diagnosis not present

## 2022-11-23 MED ORDER — AZELASTINE HCL 0.1 % NA SOLN: 1.0000 | Freq: Two times a day (BID) | NASAL | 5 refills | Status: DC

## 2022-11-23 NOTE — Patient Instructions (Signed)
Follow up as needed or as scheduled We'll call you to schedule your Orthopedic appt at Dr Dub Mikes office CONTINUE the Claritin (Cetirizine) daily, USE the Flonase regularly ADD the Azelastine nasal spray twice daily as directed to help w/ hoarseness Call with any questions or concerns Hang in there!

## 2022-11-23 NOTE — Progress Notes (Signed)
   Subjective:    Patient ID: Rachel Walton, female    DOB: 09-21-1929, 89 y.o.   MRN: [id number]  HPI Pain- 'it seems like sometimes my bones are aching'.  Pt reports R arm will start to hurt in the shoulder area and radiate 'all the way down and up into my neck'.  Pt favors R arm (is R handed).  Sxs started ~3 weeks ago.  No known injury but pt cleans house and mows the yard (on a riding mower that is difficult to turn).  Pain is less than when it initially started.  Has seen Dr Rennis Chris in the past.  Hoarseness- pt reports she has been hoarse for 2-3 months.  Pt reports ongoing allergy congestion.  Pt reports taking Claritin and occasionally Flonase.  Pt reports GERD is better controlled on the Omeprazole.  Pt reports she has seen ENT in the past.     Review of Systems For ROS see HPI     Objective:   Physical Exam Vitals reviewed.  Constitutional:      General: She is not in acute distress.    Appearance: She is not ill-appearing.  HENT:     Head: Normocephalic and atraumatic.     Right Ear: Tympanic membrane and ear canal normal.     Left Ear: Tympanic membrane and ear canal normal.     Mouth/Throat:     Mouth: Mucous membranes are moist.     Pharynx: Oropharynx is clear. No oropharyngeal exudate or posterior oropharyngeal erythema.  Musculoskeletal:        General: Tenderness (TTP over R upper arm) present.     Cervical back: Neck supple.     Comments: Decreased ROM of R shoulder  Lymphadenopathy:     Cervical: No cervical adenopathy.  Skin:    General: Skin is warm.  Neurological:     Mental Status: She is alert.     Comments: Memory impaired- does not remember visit 3 weeks ago  Psychiatric:        Mood and Affect: Mood normal.        Behavior: Behavior normal.     Comments: Wandering, tangential thoughts           Assessment & Plan:  R upper arm pain- new.  Pt feels that this started when she mowed their lawn and the riding mower was difficult to steer.  She  feels that the pain is not as severe as it was but it is not improving quickly.  She feels that her arm is weak when compared to the L and that when she had similar issues, she saw Dr Rennis Chris and did PT.  I asked if she wanted to go back to Dr Rennis Chris or give it more time to heal, she would prefer the referral.  Placed.  Hoarseness- new.  Pt reports this is an ongoing issue for the last few months.  That would correspond w/ allergy season and she has known seasonal allergies.  She also has GERD.  Both of these things could be contributing to ongoing hoarseness.  Given her memory issues, I am not sure if she is taking her daily antihistamine or using her nasal steroid.  I encouraged her to do both and will add a nasal antihistamine spray to help decrease drainage.  If no improvement, can consider ENT referral. For complete evaluation.  Pt expressed understanding and is in agreement w/ plan.

## 2022-12-03 DIAGNOSIS — H35033 Hypertensive retinopathy, bilateral: Secondary | ICD-10-CM | POA: Diagnosis not present

## 2022-12-03 DIAGNOSIS — H353132 Nonexudative age-related macular degeneration, bilateral, intermediate dry stage: Secondary | ICD-10-CM | POA: Diagnosis not present

## 2022-12-03 DIAGNOSIS — H35363 Drusen (degenerative) of macula, bilateral: Secondary | ICD-10-CM | POA: Diagnosis not present

## 2022-12-03 DIAGNOSIS — H04123 Dry eye syndrome of bilateral lacrimal glands: Secondary | ICD-10-CM | POA: Diagnosis not present

## 2022-12-04 ENCOUNTER — Ambulatory Visit: Payer: Self-pay | Admitting: *Deleted

## 2022-12-04 NOTE — Patient Instructions (Addendum)
Visit Information  Thank you for taking time to visit with me today. Please don't hesitate to contact me if I can be of assistance to you.   Following are the goals we discussed today:   Goals Addressed               This Visit's Progress     Patient Stated     Revised goal- [facility] care coordination services (breast exam, hoarseness) (pt-stated)   Not on track     Interventions Today    Flowsheet Row Most Recent Value  Chronic Disease   Chronic disease during today's visit --  [eye, mammogram , hoarseness]  General Interventions   General Interventions Discussed/Reviewed Communication with  Doctor Visits Discussed/Reviewed Doctor Visits Discussed  Communication with PCP/Specialists  [new onset of hoarseness, recurrent at intervals after home treatments of gargling with warm water.  History of GERD per patient Note sent to her GI MD Encouraged her to make an appointment for evaluation]  Exercise Interventions   Exercise Discussed/Reviewed Exercise Discussed, Physical Activity  Physical Activity Discussed/Reviewed Physical Activity Discussed, Types of exercise  Education Interventions   Education Provided Provided Web-based Education, Provided Education  Provided Verbal Education On --  [sent web base education on hoarseness, eye, breast scan]  Mental Health Interventions   Mental Health Discussed/Reviewed Mental Health Discussed, Coping Strategies  Nutrition Interventions   Nutrition Discussed/Reviewed Nutrition Discussed, Fluid intake, Decreasing fats  Pharmacy Interventions   Pharmacy Dicussed/Reviewed Pharmacy Topics Discussed, Medications and their functions, Affording Medications  Safety Interventions   Safety Discussed/Reviewed Safety Discussed, Fall Risk              Our next appointment is by telephone on 02/04/23 at 1030  Please call the care guide team at [phone] if you need to cancel or reschedule your appointment.   If you are experiencing a Mental  Health or Behavioral Health Crisis or need someone to talk to, please call the Suicide and Crisis Lifeline: 988 call the [place] National Suicide Prevention Lifeline: 509-644-8990 or TTY: 820-240-9484 TTY 206-797-4884) to talk to a trained counselor call 1-800-273-TALK (toll free, 24 hour hotline) call the [facility]: 6460593359 call 911   Patient verbalizes understanding of instructions and care plan provided today and agrees to view in MyChart. Active MyChart status and patient understanding of how to access instructions and care plan via MyChart confirmed with patient.     The patient has been provided with contact information for the care management team and has been advised to call with any health related questions or concerns.   Daquan Crapps L. Noelle Penner, RN, BSN, CCM [facility] Care Management Community Coordinator Office number [phone]

## 2022-12-04 NOTE — Patient Outreach (Addendum)
  Care Coordination   Follow Up Visit Note   12/04/2022 Name: Rachel Walton MRN: [id number] DOB: March 26, 1930  Rachel Walton is a 89 y.o. year old female who sees Tabori, Helane Rima, MD for primary care. I spoke with  Lin Landsman by phone today.  What matters to the patients health and wellness today?  Hoarse  history of GERD (gastroesophageal reflux disease) &  Episode at [employer] sandwich would not go down, had emesis.  Confirmed a change to [place] at [place] services  Breast exam needed for dense tissue noted. She will call to get scheduled to be seen in [place], closer to her home   Goals Addressed               This Visit's Progress     Patient Stated     Revised goal- [facility] care coordination services (breast exam, hoarseness) (pt-stated)   Not on track     Interventions Today    Flowsheet Row Most Recent Value  Chronic Disease   Chronic disease during today's visit --  [eye, mammogram , hoarseness]  General Interventions   General Interventions Discussed/Reviewed Communication with  Doctor Visits Discussed/Reviewed Doctor Visits Discussed  Communication with PCP/Specialists  [new onset of hoarseness, recurrent at intervals after home treatments of gargling with warm water.  History of GERD per patient Note sent to her GI MD Encouraged her to make an appointment for evaluation]  Exercise Interventions   Exercise Discussed/Reviewed Exercise Discussed, Physical Activity  Physical Activity Discussed/Reviewed Physical Activity Discussed, Types of exercise  Education Interventions   Education Provided Provided Web-based Education, Provided Education  Provided Verbal Education On --  [sent web base education on hoarseness, eye, breast scan]  Mental Health Interventions   Mental Health Discussed/Reviewed Mental Health Discussed, Coping Strategies  Nutrition Interventions   Nutrition Discussed/Reviewed Nutrition Discussed, Fluid intake, Decreasing fats  Pharmacy  Interventions   Pharmacy Dicussed/Reviewed Pharmacy Topics Discussed, Medications and their functions, Affording Medications  Safety Interventions   Safety Discussed/Reviewed Safety Discussed, Fall Risk              SDOH assessments and interventions completed:  No     Care Coordination Interventions:  Yes, provided   Follow up plan: Follow up call scheduled for 02/04/23    Encounter Outcome:  Pt. Visit Completed    Josanna Hefel L. Noelle Penner, RN, BSN, CCM [facility] Care Management Community Coordinator Office number [phone]

## 2022-12-15 ENCOUNTER — Other Ambulatory Visit: Payer: Self-pay | Admitting: Family Medicine

## 2022-12-15 DIAGNOSIS — Z1231 Encounter for screening mammogram for malignant neoplasm of breast: Secondary | ICD-10-CM

## 2022-12-17 NOTE — Assessment & Plan Note (Signed)
Chronic problem.  Currently on Lisinopril 20mg  daily w/ good control.  She reports ongoing fatigue but is otherwise asymptomatic.  Discussed that fatigue can be multifactorial- particularly at age 89.  Check labs due to ACE but no anticipated med changes.

## 2022-12-17 NOTE — Assessment & Plan Note (Signed)
Chronic problem.  Last LDL 146.  Not currently on medication but was previously.  Unclear at this time why medication was stopped- this is not on her allergy list.  At age 89, unless cholesterol in incredibly high, would be unlikely to start statin.  Pt expressed understanding and is in agreement w/ plan.

## 2022-12-17 NOTE — Assessment & Plan Note (Signed)
Check labs and replete prn. 

## 2022-12-23 ENCOUNTER — Ambulatory Visit (INDEPENDENT_AMBULATORY_CARE_PROVIDER_SITE_OTHER): Payer: Medicare PPO | Admitting: *Deleted

## 2022-12-23 DIAGNOSIS — Z0000 Encounter for general adult medical examination without abnormal findings: Secondary | ICD-10-CM

## 2022-12-23 NOTE — Patient Instructions (Signed)
[name] , Thank you for taking time to come for your Medicare Wellness Visit. I appreciate your ongoing commitment to your health goals. Please review the following plan we discussed and let me know if I can assist you in the future.   Screening recommendations/referrals: Colonoscopy: no longer required Mammogram: scheduled Bone Density: up to date Recommended yearly ophthalmology/optometry visit for glaucoma screening and checkup Recommended yearly dental visit for hygiene and checkup  Vaccinations: Influenza vaccine: Education provided Pneumococcal vaccine:  Tdap vaccine: up to date Shingles vaccine: up to date       Preventive Care 65 Years and Older, Female Preventive care refers to lifestyle choices and visits with your health care provider that can promote health and wellness. What does preventive care include? A yearly physical exam. This is also called an annual well check. Dental exams once or twice a year. Routine eye exams. Ask your health care provider how often you should have your eyes checked. Personal lifestyle choices, including: Daily care of your teeth and gums. Regular physical activity. Eating a healthy diet. Avoiding tobacco and drug use. Limiting alcohol use. Practicing safe sex. Taking low-dose aspirin every day. Taking vitamin and mineral supplements as recommended by your health care provider. What happens during an annual well check? The services and screenings done by your health care provider during your annual well check will depend on your age, overall health, lifestyle risk factors, and family history of disease. Counseling  Your health care provider may ask you questions about your: Alcohol use. Tobacco use. Drug use. Emotional well-being. Home and relationship well-being. Sexual activity. Eating habits. History of falls. Memory and ability to understand (cognition). Work and work Astronomer. Reproductive health. Screening  You  may have the following tests or measurements: Height, weight, and BMI. Blood pressure. Lipid and cholesterol levels. These may be checked every 5 years, or more frequently if you are over 97 years old. Skin check. Lung cancer screening. You may have this screening every year starting at age 65 if you have a 30-pack-year history of smoking and currently smoke or have quit within the past 15 years. Fecal occult blood test (FOBT) of the stool. You may have this test every year starting at age 38. Flexible sigmoidoscopy or colonoscopy. You may have a sigmoidoscopy every 5 years or a colonoscopy every 10 years starting at age 42. Hepatitis C blood test. Hepatitis B blood test. Sexually transmitted disease (STD) testing. Diabetes screening. This is done by checking your blood sugar (glucose) after you have not eaten for a while (fasting). You may have this done every 1-3 years. Bone density scan. This is done to screen for osteoporosis. You may have this done starting at age 59. Mammogram. This may be done every 1-2 years. Talk to your health care provider about how often you should have regular mammograms. Talk with your health care provider about your test results, treatment options, and if necessary, the need for more tests. Vaccines  Your health care provider may recommend certain vaccines, such as: Influenza vaccine. This is recommended every year. Tetanus, diphtheria, and acellular pertussis (Tdap, Td) vaccine. You may need a Td booster every 10 years. Zoster vaccine. You may need this after age 73. Pneumococcal 13-valent conjugate (PCV13) vaccine. One dose is recommended after age 54. Pneumococcal polysaccharide (PPSV23) vaccine. One dose is recommended after age 62. Talk to your health care provider about which screenings and vaccines you need and how often you need them. This information is not intended  to replace advice given to you by your health care provider. Make sure you discuss any  questions you have with your health care provider. Document Released: 05/24/2015 Document Revised: 01/15/2016 Document Reviewed: 02/26/2015 Elsevier Interactive Patient Education  2017 [employer].  Fall Prevention in the Home Falls can cause injuries. They can happen to people of all ages. There are many things you can do to make your home safe and to help prevent falls. What can I do on the outside of my home? Regularly fix the edges of walkways and driveways and fix any cracks. Remove anything that might make you trip as you walk through a door, such as a raised step or threshold. Trim any bushes or trees on the path to your home. Use bright outdoor lighting. Clear any walking paths of anything that might make someone trip, such as rocks or tools. Regularly check to see if handrails are loose or broken. Make sure that both sides of any steps have handrails. Any raised decks and porches should have guardrails on the edges. Have any leaves, snow, or ice cleared regularly. Use sand or salt on walking paths during winter. Clean up any spills in your garage right away. This includes oil or grease spills. What can I do in the bathroom? Use night lights. Install grab bars by the toilet and in the tub and shower. Do not use towel bars as grab bars. Use non-skid mats or decals in the tub or shower. If you need to sit down in the shower, use a plastic, non-slip stool. Keep the floor dry. Clean up any water that spills on the floor as soon as it happens. Remove soap buildup in the tub or shower regularly. Attach bath mats securely with double-sided non-slip rug tape. Do not have throw rugs and other things on the floor that can make you trip. What can I do in the bedroom? Use night lights. Make sure that you have a light by your bed that is easy to reach. Do not use any sheets or blankets that are too big for your bed. They should not hang down onto the floor. Have a firm chair that has side  arms. You can use this for support while you get dressed. Do not have throw rugs and other things on the floor that can make you trip. What can I do in the kitchen? Clean up any spills right away. Avoid walking on wet floors. Keep items that you use a lot in easy-to-reach places. If you need to reach something above you, use a strong step stool that has a grab bar. Keep electrical cords out of the way. Do not use floor polish or wax that makes floors slippery. If you must use wax, use non-skid floor wax. Do not have throw rugs and other things on the floor that can make you trip. What can I do with my stairs? Do not leave any items on the stairs. Make sure that there are handrails on both sides of the stairs and use them. Fix handrails that are broken or loose. Make sure that handrails are as long as the stairways. Check any carpeting to make sure that it is firmly attached to the stairs. Fix any carpet that is loose or worn. Avoid having throw rugs at the top or bottom of the stairs. If you do have throw rugs, attach them to the floor with carpet tape. Make sure that you have a light switch at the top of the stairs and  the bottom of the stairs. If you do not have them, ask someone to add them for you. What else can I do to help prevent falls? Wear shoes that: Do not have high heels. Have rubber bottoms. Are comfortable and fit you well. Are closed at the toe. Do not wear sandals. If you use a stepladder: Make sure that it is fully opened. Do not climb a closed stepladder. Make sure that both sides of the stepladder are locked into place. Ask someone to hold it for you, if possible. Clearly mark and make sure that you can see: Any grab bars or handrails. First and last steps. Where the edge of each step is. Use tools that help you move around (mobility aids) if they are needed. These include: Canes. Walkers. Scooters. Crutches. Turn on the lights when you go into a dark area.  Replace any light bulbs as soon as they burn out. Set up your furniture so you have a clear path. Avoid moving your furniture around. If any of your floors are uneven, fix them. If there are any pets around you, be aware of where they are. Review your medicines with your doctor. Some medicines can make you feel dizzy. This can increase your chance of falling. Ask your doctor what other things that you can do to help prevent falls. This information is not intended to replace advice given to you by your health care provider. Make sure you discuss any questions you have with your health care provider. Document Released: 02/21/2009 Document Revised: 10/03/2015 Document Reviewed: 06/01/2014 Elsevier Interactive Patient Education  2017 [employer].

## 2022-12-23 NOTE — Progress Notes (Signed)
Subjective:   Rachel Walton is a 89 y.o. female who presents for Medicare Annual (Subsequent) preventive examination.  Visit Complete: Virtual  I connected with  Rachel Walton on 12/23/22 by a audio enabled telemedicine application and verified that I am speaking with the correct person using two identifiers.  Patient Location: Home  Provider Location: Home Office  I discussed the limitations of evaluation and management by telemedicine. The patient expressed understanding and agreed to proceed.  Vital Signs: Unable to obtain new vitals due to this being a telehealth visit.    Review of Systems     Cardiac Risk Factors include: advanced age (>71men, >61 women);hypertension;sedentary lifestyle     Objective:    Today's Vitals   12/23/22 1430  PainSc: 6    There is no height or weight on file to calculate BMI.     12/23/2022    4:31 PM 03/30/2022   10:59 AM 03/17/2021    9:25 AM 09/13/2020    1:16 PM 03/20/2020    5:41 PM 09/29/2019    2:23 PM 07/08/2019   10:32 AM  Advanced Directives  Does Patient Have a Medical Advance Directive? No No No No No No No  Would patient like information on creating a medical advance directive? No - Patient declined  No - Patient declined No - Patient declined  No - Patient declined     Current Medications (verified) Outpatient Encounter Medications as of 12/23/2022  Medication Sig   acetaminophen (TYLENOL) 500 MG tablet Take 500 mg by mouth every 6 (six) hours as needed for mild pain.   azelastine (ASTELIN) 0.1 % nasal spray Place 1 spray into both nostrils 2 (two) times daily. Use in each nostril as directed   CALCIUM PO Take by mouth daily.   Cholecalciferol (VITAMIN D) 50 MCG (2000 UT) CAPS Take 2,000 Units by mouth daily.   clonazePAM (KLONOPIN) 0.5 MG tablet TAKE 1/2 TO 1 TABLET DAILY AS NEEDED   fluticasone (FLONASE) 50 MCG/ACT nasal spray Place 2 sprays into both nostrils daily.   lisinopril (ZESTRIL) 20 MG tablet Take 1 tablet  (20 mg total) by mouth daily.   loratadine (CLARITIN) 10 MG tablet Take 1 tablet (10 mg total) by mouth daily.   meloxicam (MOBIC) 7.5 MG tablet Take 7.5 mg by mouth daily.   mesalamine (LIALDA) 1.2 g EC tablet Take 1 tablet (1.2 g total) by mouth 2 (two) times daily.   Multiple Vitamins-Minerals (PRESERVISION AREDS PO) Take by mouth.   naphazoline-pheniramine (NAPHCON-A) 0.025-0.3 % ophthalmic solution Place 1 drop into both eyes as needed.   Omega-3 Fatty Acids (FISH OIL) 1200 MG CPDR Take by mouth.   omeprazole (PRILOSEC) 40 MG capsule Take 1 capsule (40 mg total) by mouth daily. Take 30 minutes before breakfast meal   potassium chloride (KLOR-CON) 10 MEQ tablet Take 1 tablet (10 mEq total) by mouth daily.   No facility-administered encounter medications on file as of 12/23/2022.    Allergies (verified) Alendronate sodium, Lexapro [escitalopram oxalate], Amoxicillin, Aspirin, Budesonide, Clindamycin/lincomycin, Keflex [cephalexin], Levofloxacin, Prednisone, Sudafed [pseudoephedrine hcl], Sulfa antibiotics, Toprol xl [metoprolol succinate], Trimethoprim, and Zithromax [azithromycin]   History: Past Medical History:  Diagnosis Date   Acute gastric ulcer    Allergy    Anxiety    Atrophic vaginitis    Barrett's esophagus    Basal cell carcinoma of nose     under nose   Cataract    Depression    Diverticulosis    Esophageal  stricture    Esophageal ulcer    Fatty liver    Gall stones 06/15/2013   GERD (gastroesophageal reflux disease)    Heart murmur    Hiatal hernia    Hx of adenomatous colonic polyps    Hyperlipidemia    Hypertension    Hypokalemia    IBS (irritable bowel syndrome)    Kyphosis    Lymphocytic colitis    Macular degeneration    "?wet or dry; ? 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    Tremor    Vitamin D  deficiency    Past Surgical History:  Procedure Laterality Date   ABDOMINAL HYSTERECTOMY  1980   APPENDECTOMY     BACK SURGERY     BREAST BIOPSY Left    CARPAL TUNNEL RELEASE Left 09/13/2020   Procedure: CARPAL TUNNEL RELEASE;  Surgeon: Dominica Severin, MD;  Location: [facility] OR;  Service: Orthopedics;  Laterality: Left;   CATARACT EXTRACTION W/ INTRAOCULAR LENS  IMPLANT, BILATERAL Bilateral    COLONOSCOPY     ESOPHAGOGASTRODUODENOSCOPY (EGD) WITH ESOPHAGEAL DILATION  X 1   EYE SURGERY     LUMBAR DISC SURGERY     "fragmented disc"   ORIF RADIAL FRACTURE Left 09/13/2020   Procedure: HARDWARE REMOVAL, OPEN REDUCTION INTERNAL FIXATION LEFT RADIUS;  Surgeon: Dominica Severin, MD;  Location: [facility] OR;  Service: Orthopedics;  Laterality: Left;  Block with IV sedation   POLYPECTOMY     SQUAMOUS CELL CARCINOMA EXCISION     nose tip   Family History  Problem Relation Age of Onset   Stroke Mother    Heart disease Father        MI   Heart attack Father    Cancer Sister        Brain tumor   Hypertension Sister    Osteoporosis Sister    Hypertension Sister    Breast cancer Sister 34   Hypertension Sister    Osteoporosis Sister    Hypertension Sister    Hypertension Sister    Stroke Sister    Hypertension Sister    Hypertension Sister    Colon cancer Brother    Heart disease Brother    Diabetes Son 8   Diabetes Son 40   Brain cancer Son        deceased   Cancer Son        brain   Diabetes Maternal Uncle    Colon cancer Other        nephew   Thyroid cancer Other        niece   Breast cancer Other    Breast cancer Other    Breast cancer Other    Colon polyps Neg Hx    Esophageal cancer Neg Hx    Rectal cancer Neg Hx    Stomach cancer Neg Hx    Social History   Socioeconomic History   Marital status: Married    Spouse name: Chrissie Noa   Number of children: 3   Years of education: Not on file   Highest education level: Not on file  Occupational History   Occupation: Retired   Tobacco Use   Smoking status: Never   Smokeless tobacco: Never  Vaping Use   Vaping status: Never Used  Substance and Sexual Activity   Alcohol  use: No    Alcohol/week: 0.0 standard drinks of alcohol   Drug use: No   Sexual activity: Not Currently  Other Topics Concern   Not on file  Social History Narrative   One son in [place]   One son, Tomma Lightning lives nearby and visits almost daily   Social Determinants of Health   Financial Resource Strain: Low Risk  (12/23/2022)   Overall Financial Resource Strain (CARDIA)    Difficulty of Paying Living Expenses: Not hard at all  Food Insecurity: No Food Insecurity (12/23/2022)   Hunger Vital Sign    Worried About Running Out of Food in the Last Year: Never true    Ran Out of Food in the Last Year: Never true  Transportation Needs: No Transportation Needs (12/23/2022)   PRAPARE - Administrator, Civil Service (Medical): No    Lack of Transportation (Non-Medical): No  Physical Activity: Inactive (12/23/2022)   Exercise Vital Sign    Days of Exercise per Week: 0 days    Minutes of Exercise per Session: 0 min  Stress: No Stress Concern Present (12/23/2022)   [employer] of Occupational Health - Occupational Stress Questionnaire    Feeling of Stress : Only a little  Social Connections: Moderately Isolated (12/23/2022)   Social Connection and Isolation Panel [NHANES]    Frequency of Communication with Friends and Family: Twice a week    Frequency of Social Gatherings with Friends and Family: Once a week    Attends Religious Services: Never    Database administrator or Organizations: No    Attends Engineer, structural: Never    Marital Status: Married    Tobacco Counseling Counseling given: Not Answered   Clinical Intake:  Pre-visit preparation completed: Yes  Pain : 0-10 Pain Score: 6  Pain Type: Chronic pain Pain Location: Arm Pain Orientation: Right Pain Onset: More than a month ago Pain  Frequency: Intermittent     Diabetes: No  How often do you need to have someone help you when you read instructions, pamphlets, or other written materials from your doctor or pharmacy?: 1 - Never  Interpreter Needed?: No  Information entered by :: Remi Haggard LPN   Activities of Daily Living    12/23/2022    4:30 PM 12/23/2022    2:34 PM  In your present state of health, do you have any difficulty performing the following activities:  Hearing? 0 0  Vision? 0 0  Difficulty concentrating or making decisions? 0 0  Walking or climbing stairs? 1 0  Dressing or bathing? 0 0  Doing errands, shopping? 1 1  Preparing Food and eating ? N   Using the Toilet? N N  In the past six months, have you accidently leaked urine? Y Y  Do you have problems with loss of bowel control? N N  Managing your Medications? N N  Managing your Finances? N N  Housekeeping or managing your Housekeeping? Malvin Johns    Patient Care Team: Sheliah Hatch, MD as PCP - General (Family Medicine) Ovidio Kin, MD as Consulting Physician (General Surgery) Bjorn Pippin, MD as Attending Physician (Urology) Osborn Coho, MD (Inactive) as Consulting Physician (Otolaryngology) Francena Hanly, MD as Consulting Physician (Orthopedic Surgery) Aris Lot, MD as Consulting Physician (Dermatology) Pyrtle, Carie Caddy, MD as Consulting Physician (Gastroenterology) Croitoru, Rachelle Hora, MD as Consulting Physician (Cardiology) Blima Ledger, OD (Optometry) Clinton Gallant, RN as [facility] Care Management  Indicate any recent Medical Services you [phone]  have received from other than [facility] providers in the past year (date may be approximate).     Assessment:   This is a routine wellness examination for Rachel Walton.  Hearing/Vision screen Hearing Screening - Comments:: No trouble hearing Vision Screening - Comments:: Up to date  Unsure  of name Has appointment scheduled   Dietary issues and exercise activities  discussed:     Goals Addressed             This Visit's Progress    Patient Stated       No goals     Patient Stated       No goals       Depression Screen    12/23/2022    2:41 PM 11/23/2022    2:42 PM 11/06/2022   12:31 PM 05/08/2022    9:40 AM 04/21/2022    1:59 PM 04/06/2022    3:33 PM 04/06/2022   11:11 AM  PHQ 2/9 Scores  PHQ - 2 Score 1 0 2 1 1 2 2   PHQ- 9 Score 3 1 5  2 7 7     Fall Risk    12/23/2022    4:30 PM 11/23/2022    2:42 PM 11/06/2022   12:32 PM 05/08/2022    9:37 AM 04/21/2022    1:58 PM  Fall Risk   Falls in the past year? 0 0 1 1 1   Number falls in past yr: 0 0 1 1 1   Injury with Fall? 0 0 1 1 1   Risk for fall due to :  No Fall Risks History of fall(s) History of fall(s);Impaired balance/gait History of fall(s)  Follow up Falls evaluation completed;Education provided;Falls prevention discussed Falls evaluation completed Falls evaluation completed Falls evaluation completed Education provided    MEDICARE RISK AT HOME:  Medicare Risk at Home - 12/23/22 1435     Any stairs in or around the home? Yes    If so, are there any without handrails? No    Home free of loose throw rugs in walkways, pet beds, electrical cords, etc? Yes    Adequate lighting in your home to reduce risk of falls? Yes    Life alert? No    Use of a cane, walker or w/c? No    Grab bars in the bathroom? Yes    Shower chair or bench in shower? Yes    Elevated toilet seat or a handicapped toilet? Yes             TIMED UP AND GO:  Was the test performed?  No    Cognitive Function:    01/08/2017   11:17 AM 12/30/2015   12:26 PM 08/30/2014   12:20 PM  MMSE - Mini Mental State Exam  Orientation to time 5 5 5   Orientation to Place 5 5 5   Registration 3 3 3   Attention/ Calculation 5 5 5   Recall 3 1 3   Language- name 2 objects 2 2 2   Language- repeat 1 1 1   Language- follow 3 step command 3 1 3   Language- read & follow direction 1 1 1   Write a sentence 1 1 1    Copy design 1 1 1   Total score 30 26 30         12/23/2022    3:19 PM 03/17/2021   10:17 AM  6CIT Screen  What Year? 4 points 0 points  What month? 0 points 0 points  What time? 3 points 0 points  Count back from  20 0 points 0 points  Months in reverse 4 points 0 points  Repeat phrase 4 points 0 points  Total Score 15 points 0 points    Immunizations Immunization History  Administered Date(s) Administered   Fluad Quad(high Dose 65+) 01/24/2020, 04/21/2022   Influenza Whole 02/08/2010   Influenza, High Dose Seasonal PF 04/13/2016, 03/11/2017, 03/11/2018   Influenza,inj,Quad PF,6+ Mos 03/07/2013, 04/12/2014, 03/29/2015, 02/22/2019   Moderna Sars-Covid-2 Vaccination 08/07/2019, 08/29/2019, 02/09/2020   Pneumococcal Conjugate-13 05/01/2013   Pneumococcal Polysaccharide-23 02/08/2001   Td 05/11/2006   Tdap 01/08/2017, 09/29/2019   Zoster Recombinant(Shingrix) 12/04/2020, 10/20/2021   Zoster, Live 09/09/2006    TDAP status: Up to date  Flu Vaccine status: Due, Education has been provided regarding the importance of this vaccine. Advised may receive this vaccine at local pharmacy or Health Dept. Aware to provide a copy of the vaccination record if obtained from local pharmacy or Health Dept. Verbalized acceptance and understanding.  Pneumococcal vaccine status: Up to date  Covid-19 vaccine status: Information provided on how to obtain vaccines.   Qualifies for Shingles Vaccine? Yes   Zostavax completed No   Shingrix Completed?: Yes  Screening Tests Health Maintenance  Topic Date Due   MAMMOGRAM  12/16/2022   INFLUENZA VACCINE  12/10/2022   Medicare Annual Wellness (AWV)  12/23/2023   DEXA SCAN  01/18/2024   DTaP/Tdap/Td (4 - Td or Tdap) 09/28/2029   Pneumonia Vaccine 58+ Years old  Completed   Zoster Vaccines- Shingrix  Completed   HPV VACCINES  Aged Out   COVID-19 Vaccine  Discontinued    Health Maintenance  Health Maintenance Due  Topic Date Due   MAMMOGRAM   12/16/2022   INFLUENZA VACCINE  12/10/2022    Colorectal cancer screening: No longer required.   Mammogram status: No longer required due to  .  Bone Density status: Completed 2022. Results reflect: Bone density results: OSTEOPENIA. Repeat every   years.  Lung Cancer Screening: (Low Dose CT Chest recommended if Age 1-80 years, 20 pack-year currently smoking OR have quit w/in 15years.) does not qualify.   Lung Cancer Screening Referral:   Additional Screening:  Hepatitis C Screening: does not qualify;  Vision Screening: Recommended annual ophthalmology exams for early detection of glaucoma and other disorders of the eye. Is the patient up to date with their annual eye exam?  Yes  Who is the provider or what is the name of the office in which the patient attends annual eye exams? Unsure of name If pt is not established with a provider, would they like to be referred to a provider to establish care? No .   Dental Screening: Recommended annual dental exams for proper oral hygiene  Diabetic Foot Exam:   Community Resource Referral / Chronic Care Management: CRR required this visit?  No   CCM required this visit?  No     Plan:     I have personally reviewed and noted the following in the patient's chart:   Medical and social history Use of alcohol, tobacco or illicit drugs  Current medications and supplements including opioid prescriptions. Patient is not currently taking opioid prescriptions. Functional ability and status Nutritional status Physical activity Advanced directives List of other physicians Hospitalizations, surgeries, and ER visits in previous 12 months Vitals Screenings to include cognitive, depression, and falls Referrals and appointments  In addition, I have reviewed and discussed with patient certain preventive protocols, quality metrics, and best practice recommendations. A written personalized care plan for preventive services as  well as general  preventive health recommendations were provided to patient.     Remi Haggard, LPN   1/61/0960   After Visit Summary: (MyChart) Due to this being a telephonic visit, the after visit summary with patients personalized plan was offered to patient via MyChart   Nurse Notes:

## 2022-12-28 DIAGNOSIS — M19011 Primary osteoarthritis, right shoulder: Secondary | ICD-10-CM | POA: Diagnosis not present

## 2022-12-28 DIAGNOSIS — M47812 Spondylosis without myelopathy or radiculopathy, cervical region: Secondary | ICD-10-CM | POA: Diagnosis not present

## 2022-12-28 DIAGNOSIS — M542 Cervicalgia: Secondary | ICD-10-CM | POA: Diagnosis not present

## 2023-01-06 ENCOUNTER — Ambulatory Visit
Admission: RE | Admit: 2023-01-06 | Discharge: 2023-01-06 | Disposition: A | Discharge status: Home / Self Care | Payer: Medicare PPO | Source: Ambulatory Visit | Attending: Family Medicine | Admitting: Family Medicine

## 2023-01-06 DIAGNOSIS — Z1231 Encounter for screening mammogram for malignant neoplasm of breast: Secondary | ICD-10-CM

## 2023-02-04 ENCOUNTER — Ambulatory Visit: Payer: Self-pay | Admitting: *Deleted

## 2023-02-04 NOTE — Patient Outreach (Signed)
Care Coordination   02/04/2023 Name: Rachel Walton MRN: [id number] DOB: 11-15-1929   Care Coordination Outreach Attempts:  An unsuccessful telephone outreach was attempted for a scheduled appointment today.  Follow Up Plan:  Additional outreach attempts will be made to offer the patient care coordination information and services.   Encounter Outcome:  No Answer   Care Coordination Interventions:  No, not indicated      Kaylum Shrum L. Noelle Penner, RN, BSN, CCM, Care Management Coordinator [phone]

## 2023-02-26 ENCOUNTER — Ambulatory Visit: Payer: Self-pay

## 2023-02-26 NOTE — Patient Outreach (Signed)
  Care Coordination   Follow Up Visit Note   02/26/2023 Name: Rachel Walton MRN: [id number] DOB: 1929/09/25  Rachel Walton is a 89 y.o. year old female who sees Tabori, Helane Rima, MD for primary care. I spoke with  Lin Landsman by phone today.  What matters to the patients health and wellness today?  Maintain health    Goals Addressed               This Visit's Progress     Maintain Health (pt-stated)        Patient Goals/Self Care Activities: -Patient/Caregiver will take medications as prescribed   -Patient/Caregiver will attend all scheduled provider appointments -Patient/Caregiver will call provider office for new concerns or questions     Spoke with patient. She reports doing well.  Continues to deal with hoarseness.  She is using antihistamine and nasal spray. Discussed possible ENT referral as PCP discussed. She states she has seen them in the past.  Advised to notify PCP if she wants to be referred.  She verbalized understanding.  No concerns.          SDOH assessments and interventions completed:  Yes     Care Coordination Interventions:  Yes, provided   Follow up plan: Follow up call scheduled for December    Encounter Outcome:  Patient Visit Completed   Bary Leriche, RN, MSN [place]  [facility], [facility] Management Community Coordinator Direct Dial: [phone]  Fax: [phone] Website: [email or web address]

## 2023-02-26 NOTE — Patient Instructions (Signed)
Visit Information  Thank you for taking time to visit with me today. Please don't hesitate to contact me if I can be of assistance to you.   Following are the goals we discussed today:   Goals Addressed               This Visit's Progress     Maintain Health (pt-stated)        Patient Goals/Self Care Activities: -Patient/Caregiver will take medications as prescribed   -Patient/Caregiver will attend all scheduled provider appointments -Patient/Caregiver will call provider office for new concerns or questions     Spoke with patient. She reports doing well.  Continues to deal with hoarseness.  She is using antihistamine and nasal spray. Discussed possible ENT referral as PCP discussed. She states she has seen them in the past.  Advised to notify PCP if she wants to be referred.  She verbalized understanding.  No concerns.          Our next appointment is by telephone on 04/16/23 at 1000 am  Please call the care guide team at [phone] if you need to cancel or reschedule your appointment.   If you are experiencing a Mental Health or Behavioral Health Crisis or need someone to talk to, please call the Suicide and Crisis Lifeline: 988   Patient verbalizes understanding of instructions and care plan provided today and agrees to view in MyChart. Active MyChart status and patient understanding of how to access instructions and care plan via MyChart confirmed with patient.     The patient has been provided with contact information for the care management team and has been advised to call with any health related questions or concerns.   Bary Leriche, RN, MSN [facility], [facility] Management Community Coordinator Direct Dial: [phone]  Fax: [phone] Website: [email or web address]

## 2023-03-29 ENCOUNTER — Telehealth: Payer: Self-pay | Admitting: *Deleted

## 2023-03-29 NOTE — Patient Outreach (Signed)
  Care Management   Follow Up Note   03/29/2023 Name: Rachel Walton MRN: [id number] DOB: [birth year]   Referred by: Sheliah Hatch, MD Reason for referral : Care Management (RNCM: Follow up for voicemail that patient left re: spouse medication refill)   RNCM called to follow up with patient regarding voicemail left. Patient states she was calling due to needing refill for spouses medication. Patient states she was able to contact provider and no longer needs assistance at this time.  Follow Up Plan: Telephone follow up appointment with care management team member scheduled for: 04-16-2023  Danise Edge, BSN RN RN Care Manager  [facility]  Ambulatory Care Management  Direct Number: [phone]

## 2023-04-16 ENCOUNTER — Other Ambulatory Visit: Payer: Self-pay | Admitting: *Deleted

## 2023-04-16 NOTE — Patient Outreach (Signed)
Care Management   Visit Note  04/16/2023 Name: Rachel Walton MRN: [id number] DOB: 08-Aug-1929  Subjective: Rachel Walton is a 89 y.o. year old female who is a primary care patient of Tabori, Helane Rima, MD. The Care Management team was consulted for assistance.      Engaged with patient spoke with patient by telephone.    Goals Addressed             This Visit's Progress    RNCM Care Management Expected Outcomes: Monitor, Self-Manage and Reduce Sytmpoms of: HTN, HLD, Pain       Current Barriers:  Knowledge Deficits related to plan of care for management of HTN, HLD, and Pain  Chronic Disease Management support and education needs related to HTN, HLD, and Pain  Cognitive Deficits  RNCM Clinical Goal(s):  Patient will verbalize basic understanding of  HTN, HLD, and Pain disease process and self health management plan as evidenced by verbal explanation, recognizing symptoms and lifestyle modifications. take all medications exactly as prescribed and will call provider for medication related questions as evidenced by compliance with all medications attend all scheduled medical appointments: with primary care provider and specialist as evidenced by keeping all scheduled appointments demonstrate Improved and Ongoing adherence to prescribed treatment plan for HTN, HLD, and Pain as evidenced by consistent medication compliance, symptom monitoring and continued lifestyle modifications continue to work with RN Care Manager to address care management and care coordination needs related to  HTN, HLD, and Pain as evidenced by adherence to CM Team Scheduled appointments through collaboration with RN Care manager, provider, and care team.   Interventions: Evaluation of current treatment plan related to  self management and patient's adherence to plan as established by provider   Hyperlipidemia Interventions:  (Status:  New goal. and Goal on track:  Yes.) Long Term Goal Medication review  performed; medication list updated in electronic medical record.  Provider established cholesterol goals reviewed Counseled on importance of regular laboratory monitoring as prescribed Provided HLD educational materials Reviewed importance of limiting foods high in cholesterol. Patient states she goes to [employer] several times per week and she eats a crispy chicken sandwich. RNCM suggested looking for grilled or baked options instead. She states that she was on cholesterol medication years ago and she stopped taking it at the recommendation of a friend and started taking fish oil. RNCM advised to discuss all medications changes or concerns with her primary care provider before taking/discontinuing any other medications. Patient verbalized full understanding. Reviewed exercise goals and target of 150 minutes per week Screening for signs and symptoms of depression related to chronic disease state Assessed social determinant of health barriers   Hypertension Interventions:  (Status:  New goal. and Goal on track:  Yes.) Long Term Goal Last practice recorded BP readings:  BP Readings from Last 3 Encounters:  11/23/22 106/68  11/06/22 116/70  09/28/22 (!) 120/51   Most recent eGFR/CrCl:  Lab Results  Component Value Date   EGFR 53 (L) 04/21/2022    No components found for: "CRCL"  Evaluation of current treatment plan related to hypertension self management and patient's adherence to plan as established by provider Provided education to patient re: stroke prevention, s/s of heart attack and stroke Reviewed medications with patient and discussed importance of compliance Counseled on adverse effects of illicit drug and excessive alcohol use in patients with high blood pressure  Discussed plans with patient for ongoing care management follow up and provided patient with direct contact information  for care management team Advised patient, providing education and rationale, to monitor blood pressure  daily and record, calling PCP for findings outside established parameters. Regularly checks blood pressure but does not keep a log. RNCM advised to keep log of blood pressures. Reviewed scheduled/upcoming provider appointments including: 05-14-2023 with PCP Discussed complications of poorly controlled blood pressure such as heart disease, stroke, circulatory complications, vision complications, kidney impairment, sexual dysfunction  Pain Interventions:  (Status:  New goal. and Goal on track:  Yes.) Long Term Goal Pain assessment performed. States she continues to feel "weak" in her upper extremities and she was seen by Dr. Rennis Chris who states she has arthritis and recommended she taking Tylenol Arthritis. She states she is also using Voltaren gel for the pain/discomfort. She expresses concern related to her pain/discomfort and low energy levels and states she use to be able to do more around the house that she has not been able to successfully completed. RNCM inquired if she would like a referral to be sent to SW to see if she could qualify for a PCA, she declines at this time. Medications reviewed Reviewed provider established plan for pain management Counseled on the importance of reporting any/all new or changed pain symptoms or management strategies to pain management provider Discussed use of relaxation techniques and/or diversional activities to assist with pain reduction (distraction, imagery, relaxation, massage, acupressure, TENS, heat, and cold application Reviewed with patient prescribed pharmacological and nonpharmacological pain relief strategies Advised patient to discuss any new changes with provider   Patient Goals/Self-Care Activities: Take all medications as prescribed Attend all scheduled provider appointments Call pharmacy for medication refills 3-7 days in advance of running out of medications Attend church or other social activities Perform all self care activities independently   Call provider office for new concerns or questions  check blood pressure daily write blood pressure results in a log or diary call doctor with any symptoms you believe are related to your medicine  Follow Up Plan:  Telephone follow up appointment with care management team member scheduled for:  05-25-2023 at 1:45 pm          Consent to Services:  Patient was given information about care management services, agreed to services, and gave verbal consent to participate.   Plan: Telephone follow up appointment with care management team member scheduled for:05-25-2023 at 1:45 pm  Danise Edge, BSN RN RN Care Manager  [facility]  Ambulatory Care Management  Direct Number: [phone]

## 2023-04-16 NOTE — Patient Instructions (Signed)
Visit Information  Thank you for taking time to visit with me today. Please don't hesitate to contact me if I can be of assistance to you before our next scheduled telephone appointment.  Following are the goals we discussed today:   Goals Addressed             This Visit's Progress    RNCM Care Management Expected Outcomes: Monitor, Self-Manage and Reduce Sytmpoms of: HTN, HLD, Pain       Current Barriers:  Knowledge Deficits related to plan of care for management of HTN, HLD, and Pain  Chronic Disease Management support and education needs related to HTN, HLD, and Pain  Cognitive Deficits  RNCM Clinical Goal(s):  Patient will verbalize basic understanding of  HTN, HLD, and Pain disease process and self health management plan as evidenced by verbal explanation, recognizing symptoms and lifestyle modifications. take all medications exactly as prescribed and will call provider for medication related questions as evidenced by compliance with all medications attend all scheduled medical appointments: with primary care provider and specialist as evidenced by keeping all scheduled appointments demonstrate Improved and Ongoing adherence to prescribed treatment plan for HTN, HLD, and Pain as evidenced by consistent medication compliance, symptom monitoring and continued lifestyle modifications continue to work with RN Care Manager to address care management and care coordination needs related to  HTN, HLD, and Pain as evidenced by adherence to CM Team Scheduled appointments through collaboration with RN Care manager, provider, and care team.   Interventions: Evaluation of current treatment plan related to  self management and patient's adherence to plan as established by provider   Hyperlipidemia Interventions:  (Status:  New goal. and Goal on track:  Yes.) Long Term Goal Medication review performed; medication list updated in electronic medical record.  Provider established cholesterol goals  reviewed Counseled on importance of regular laboratory monitoring as prescribed Provided HLD educational materials Reviewed importance of limiting foods high in cholesterol. Patient states she goes to [employer] several times per week and she eats a crispy chicken sandwich. RNCM suggested looking for grilled or baked options instead. She states that she was on cholesterol medication years ago and she stopped taking it at the recommendation of a friend and started taking fish oil. RNCM advised to discuss all medications changes or concerns with her primary care provider before taking/discontinuing any other medications. Patient verbalized full understanding. Reviewed exercise goals and target of 150 minutes per week Screening for signs and symptoms of depression related to chronic disease state Assessed social determinant of health barriers   Hypertension Interventions:  (Status:  New goal. and Goal on track:  Yes.) Long Term Goal Last practice recorded BP readings:  BP Readings from Last 3 Encounters:  11/23/22 106/68  11/06/22 116/70  09/28/22 (!) 120/51   Most recent eGFR/CrCl:  Lab Results  Component Value Date   EGFR 53 (L) 04/21/2022    No components found for: "CRCL"  Evaluation of current treatment plan related to hypertension self management and patient's adherence to plan as established by provider Provided education to patient re: stroke prevention, s/s of heart attack and stroke Reviewed medications with patient and discussed importance of compliance Counseled on adverse effects of illicit drug and excessive alcohol use in patients with high blood pressure  Discussed plans with patient for ongoing care management follow up and provided patient with direct contact information for care management team Advised patient, providing education and rationale, to monitor blood pressure daily and record, calling PCP for  findings outside established parameters. Regularly checks blood  pressure but does not keep a log. RNCM advised to keep log of blood pressures. Reviewed scheduled/upcoming provider appointments including: 05-14-2023 with PCP Discussed complications of poorly controlled blood pressure such as heart disease, stroke, circulatory complications, vision complications, kidney impairment, sexual dysfunction  Pain Interventions:  (Status:  New goal. and Goal on track:  Yes.) Long Term Goal Pain assessment performed. States she continues to feel "weak" in her upper extremities and she was seen by Dr. Rennis Chris who states she has arthritis and recommended she taking Tylenol Arthritis. She states she is also using Voltaren gel for the pain/discomfort. She expresses concern related to her pain/discomfort and low energy levels and states she use to be able to do more around the house that she has not been able to successfully completed. RNCM inquired if she would like a referral to be sent to SW to see if she could qualify for a PCA, she declines at this time. Medications reviewed Reviewed provider established plan for pain management Counseled on the importance of reporting any/all new or changed pain symptoms or management strategies to pain management provider Discussed use of relaxation techniques and/or diversional activities to assist with pain reduction (distraction, imagery, relaxation, massage, acupressure, TENS, heat, and cold application Reviewed with patient prescribed pharmacological and nonpharmacological pain relief strategies Advised patient to discuss any new changes with provider   Patient Goals/Self-Care Activities: Take all medications as prescribed Attend all scheduled provider appointments Call pharmacy for medication refills 3-7 days in advance of running out of medications Attend church or other social activities Perform all self care activities independently  Call provider office for new concerns or questions  check blood pressure daily write blood  pressure results in a log or diary call doctor with any symptoms you believe are related to your medicine  Follow Up Plan:  Telephone follow up appointment with care management team member scheduled for:  05-25-2023 at 1:45 pm          Our next appointment is by telephone on 05-25-2023 at 1:45 pm  Please call the care guide team at [phone] if you need to cancel or reschedule your appointment.   If you are experiencing a Mental Health or Behavioral Health Crisis or need someone to talk to, please call the Suicide and Crisis Lifeline: 988 call the [place] National Suicide Prevention Lifeline: 907-557-1335 or TTY: 6042656451 TTY (316)225-6387) to talk to a trained counselor call 1-800-273-TALK (toll free, 24 hour hotline) call 911   Patient verbalizes understanding of instructions and care plan provided today and agrees to view in MyChart. Active MyChart status and patient understanding of how to access instructions and care plan via MyChart confirmed with patient.     Telephone follow up appointment with care management team member scheduled for:05-25-2023 at 1:45 pm  Danise Edge, BSN RN RN Care Manager  [facility]  Ambulatory Care Management  Direct Number: [phone]

## 2023-04-26 ENCOUNTER — Telehealth: Payer: Self-pay | Admitting: Family Medicine

## 2023-04-26 ENCOUNTER — Other Ambulatory Visit: Payer: Self-pay | Admitting: Nurse Practitioner

## 2023-04-26 DIAGNOSIS — K2270 Barrett's esophagus without dysplasia: Secondary | ICD-10-CM

## 2023-04-26 DIAGNOSIS — E876 Hypokalemia: Secondary | ICD-10-CM

## 2023-04-26 DIAGNOSIS — I10 Essential (primary) hypertension: Secondary | ICD-10-CM

## 2023-04-26 NOTE — Telephone Encounter (Signed)
Encourage patient to contact the pharmacy for refills or they can request refills through [facility]  (Please schedule appointment if patient has not been seen in over a year)    WHAT PHARMACY WOULD THEY LIKE THIS SENT TO: [facility] - [place], [place] - [address]   MEDICATION NAME & DOSE:  mesalamine (LIALDA) 1.2 g EC tablet  potassium chloride (KLOR-CON) 10 MEQ tablet  lisinopril (ZESTRIL) 20 MG tablet   NOTES/COMMENTS FROM PATIENT: Pt no longer sees Mary-Margaret Daphine Deutscher FNP to get 2 of these meds filled. I advised her Dr. Rhea Belton may have to refill the one prescribed by him.     Front office please notify patient: It takes 48-72 hours to process rx refill requests Ask patient to call pharmacy to ensure rx is ready before heading there.

## 2023-04-27 MED ORDER — LISINOPRIL 20 MG PO TABS: 20.0000 mg | ORAL_TABLET | Freq: Every day | ORAL | 1 refills | Status: DC

## 2023-04-27 MED ORDER — POTASSIUM CHLORIDE ER 10 MEQ PO TBCR: 10.0000 meq | EXTENDED_RELEASE_TABLET | Freq: Every day | ORAL | 1 refills | Status: DC

## 2023-04-27 MED ORDER — MESALAMINE 1.2 G PO TBEC: 1.2000 g | DELAYED_RELEASE_TABLET | Freq: Two times a day (BID) | ORAL | 11 refills | Status: AC

## 2023-04-27 NOTE — Telephone Encounter (Signed)
Called to inform patient these meds were sent, no answer unable to LM

## 2023-04-27 NOTE — Telephone Encounter (Signed)
Patient is no longer seeing Mary-Margaret FNP and is requesting you prescribe the potassium and Lisinopril she used to prescribe, and is also requesting you write the Lialda that Dr Rhea Belton has been prescribing

## 2023-05-14 ENCOUNTER — Ambulatory Visit (INDEPENDENT_AMBULATORY_CARE_PROVIDER_SITE_OTHER): Payer: Medicare PPO | Admitting: Family Medicine

## 2023-05-14 ENCOUNTER — Encounter: Payer: Self-pay | Admitting: Family Medicine

## 2023-05-14 VITALS — BP 136/68 | HR 77 | Temp 98.0°F | Ht 61.0 in | Wt 124.0 lb

## 2023-05-14 DIAGNOSIS — Z23 Encounter for immunization: Secondary | ICD-10-CM

## 2023-05-14 DIAGNOSIS — Z0000 Encounter for general adult medical examination without abnormal findings: Secondary | ICD-10-CM | POA: Insufficient documentation

## 2023-05-14 DIAGNOSIS — F419 Anxiety disorder, unspecified: Secondary | ICD-10-CM | POA: Diagnosis not present

## 2023-05-14 DIAGNOSIS — J302 Other seasonal allergic rhinitis: Secondary | ICD-10-CM | POA: Diagnosis not present

## 2023-05-14 DIAGNOSIS — I10 Essential (primary) hypertension: Secondary | ICD-10-CM | POA: Diagnosis not present

## 2023-05-14 DIAGNOSIS — E559 Vitamin D deficiency, unspecified: Secondary | ICD-10-CM

## 2023-05-14 LAB — TSH: TSH: 1.11 u[IU]/mL (ref 0.35–5.50)

## 2023-05-14 LAB — LIPID PANEL
Cholesterol: 322 mg/dL — ABNORMAL HIGH (ref 0–200)
HDL: 96.8 mg/dL (ref >39.00)
LDL Cholesterol: 181 mg/dL — ABNORMAL HIGH (ref 0–99)
NonHDL: 224.8
Total CHOL/HDL Ratio: 3
Triglycerides: 221 mg/dL — ABNORMAL HIGH (ref 0.0–149.0)
VLDL: 44.2 mg/dL — ABNORMAL HIGH (ref 0.0–40.0)

## 2023-05-14 LAB — HEPATIC FUNCTION PANEL
ALT: 11 U/L (ref 0–35)
AST: 19 U/L (ref 0–37)
Albumin: 4 g/dL (ref 3.5–5.2)
Alkaline Phosphatase: 54 U/L (ref 39–117)
Bilirubin, Direct: 0.1 mg/dL (ref 0.0–0.3)
Total Bilirubin: 0.5 mg/dL (ref 0.2–1.2)
Total Protein: 6.5 g/dL (ref 6.0–8.3)

## 2023-05-14 LAB — CBC WITH DIFFERENTIAL/PLATELET
Basophils Absolute: 0 10*3/uL (ref 0.0–0.1)
Basophils Relative: 0.9 % (ref 0.0–3.0)
Eosinophils Absolute: 0.2 10*3/uL (ref 0.0–0.7)
Eosinophils Relative: 3.3 % (ref 0.0–5.0)
HCT: 39 % (ref 36.0–46.0)
Hemoglobin: 12.7 g/dL (ref 12.0–15.0)
Lymphocytes Relative: 43.3 % (ref 12.0–46.0)
Lymphs Abs: 2.2 10*3/uL (ref 0.7–4.0)
MCHC: 32.5 g/dL (ref 30.0–36.0)
MCV: 91 fL (ref 78.0–100.0)
Monocytes Absolute: 0.4 10*3/uL (ref 0.1–1.0)
Monocytes Relative: 7.7 % (ref 3.0–12.0)
Neutro Abs: 2.3 10*3/uL (ref 1.4–7.7)
Neutrophils Relative %: 44.8 % (ref 43.0–77.0)
Platelets: 258 10*3/uL (ref 150.0–400.0)
RBC: 4.28 Mil/uL (ref 3.87–5.11)
RDW: 13.2 % (ref 11.5–15.5)
WBC: 5.1 10*3/uL (ref 4.0–10.5)

## 2023-05-14 LAB — BASIC METABOLIC PANEL
BUN: 8 mg/dL (ref 6–23)
CO2: 25 meq/L (ref 19–32)
Calcium: 9 mg/dL (ref 8.4–10.5)
Chloride: 103 meq/L (ref 96–112)
Creatinine, Ser: 0.9 mg/dL (ref 0.40–1.20)
GFR: 55.94 mL/min — ABNORMAL LOW (ref >60.00)
Glucose, Bld: 92 mg/dL (ref 70–99)
Potassium: 4.5 meq/L (ref 3.5–5.1)
Sodium: 139 meq/L (ref 135–145)

## 2023-05-14 LAB — VITAMIN D 25 HYDROXY (VIT D DEFICIENCY, FRACTURES): VITD: 38.63 ng/mL (ref 30.00–100.00)

## 2023-05-14 MED ORDER — CLONAZEPAM 0.5 MG PO TABS: ORAL_TABLET | ORAL | 1 refills | Status: DC

## 2023-05-14 MED ORDER — LORATADINE 10 MG PO TABS: 10.0000 mg | ORAL_TABLET | Freq: Every day | ORAL | 11 refills | Status: AC

## 2023-05-14 NOTE — Patient Instructions (Signed)
 Follow up in 6 months to recheck BP We'll notify you of your lab results and make any changes if needed Call Dr Albertus and schedule an appt RESTART the Claritin  and the Flonase  to help w/ the drainage Call with any questions or concerns Stay Safe!  Stay Healthy! Happy New Year!

## 2023-05-14 NOTE — Progress Notes (Signed)
   Subjective:    Patient ID: Rachel Walton, female    DOB: 1929/08/12, 89 y.o.   MRN: [id number]  HPI CPE- UTD on mammo, immunizations w/ exception of flu  Patient Care Team    Relationship Specialty Notifications Start End  Mahlon Comer BRAVO, MD PCP - General Family Medicine  12/02/22   Ethyl Lenis, MD Consulting Physician General Surgery  06/15/13   Watt Rush, MD Attending Physician Urology  06/15/13   Mable Lenis, MD (Inactive) Consulting Physician Otolaryngology  08/30/14   Melita Drivers, MD Consulting Physician Orthopedic Surgery  08/30/14   Lynnell Nottingham, MD Consulting Physician Dermatology  12/30/15   Albertus Gordy HERO, MD Consulting Physician Gastroenterology  12/30/15   Francyne Headland, MD Consulting Physician Cardiology  12/30/15   Cleotilde Sewer, OD  Optometry  01/12/17   Bertrum Rosina HERO, RN VBCI Care Management General Practice  03/29/23    Comment: [phone]    Health Maintenance  Topic Date Due   INFLUENZA VACCINE  12/10/2022   Medicare Annual Wellness (AWV)  12/23/2023   MAMMOGRAM  01/06/2024   DEXA SCAN  01/18/2024   DTaP/Tdap/Td (4 - Td or Tdap) 09/28/2029   Pneumonia Vaccine 27+ Years old  Completed   Zoster Vaccines- Shingrix   Completed   HPV VACCINES  Aged Out   COVID-19 Vaccine  Discontinued      Review of Systems Patient reports no vision/ hearing changes, adenopathy,fever, weight change,  persistant/recurrent hoarseness, chest pain, palpitations, edema, persistant/recurrent cough, hemoptysis, dyspnea (rest/exertional/paroxysmal nocturnal), gastrointestinal bleeding (melena, rectal bleeding), abdominal pain, significant heartburn, bowel changes, GU symptoms (dysuria, hematuria, incontinence), Gyn symptoms (abnormal  bleeding, pain),  syncope, focal weakness, memory loss, skin/hair/nail changes, abnormal bruising or bleeding, anxiety, or depression.   + dysphagia- follows w/ Dr Albertus + bilateral hand numbness    Objective:   Physical Exam General  Appearance:    Alert, cooperative, no distress, appears stated age  Head:    Normocephalic, without obvious abnormality, atraumatic  Eyes:    PERRL, conjunctiva/corneas clear, EOM's intact, fundi    benign, both eyes  Ears:    Normal TM's and external ear canals, both ears  Nose:   Nares normal, septum midline, mucosa normal, no drainage    or sinus tenderness  Throat:   Lips, mucosa, and tongue normal; teeth and gums normal  Neck:   Supple, symmetrical, trachea midline, no adenopathy;    Thyroid : no enlargement/tenderness/nodules  Back:     Symmetric, no curvature, ROM normal, no CVA tenderness  Lungs:     Clear to auscultation bilaterally, respirations unlabored  Chest Wall:    No tenderness or deformity   Heart:    Regular rate and rhythm, S1 and S2 normal, no murmur, rub   or gallop  Breast Exam:    Deferred to mammo  Abdomen:     Soft, non-tender, bowel sounds active all four quadrants,    no masses, no organomegaly  Genitalia:    Deferred  Rectal:    Extremities:   Extremities normal, atraumatic, no cyanosis or edema  Pulses:   2+ and symmetric all extremities  Skin:   Skin color, texture, turgor normal, no rashes or lesions  Lymph nodes:   Cervical, supraclavicular, and axillary nodes normal  Neurologic:   CNII-XII intact, + tremor, decreased hearing          Assessment & Plan:

## 2023-05-17 ENCOUNTER — Other Ambulatory Visit: Payer: Self-pay

## 2023-05-17 ENCOUNTER — Telehealth: Payer: Self-pay

## 2023-05-17 DIAGNOSIS — E785 Hyperlipidemia, unspecified: Secondary | ICD-10-CM

## 2023-05-17 MED ORDER — ROSUVASTATIN CALCIUM 10 MG PO TABS: 10.0000 mg | ORAL_TABLET | Freq: Every day | ORAL | 3 refills | Status: DC

## 2023-05-17 NOTE — Telephone Encounter (Signed)
-----   Message from Comer Greet sent at 05/17/2023  9:57 AM EST ----- Your total cholesterol and LDL (bad cholesterol) are MUCH too high.  I recommend starting Crestor  10mg  nightly (#30, 3 refills) and rechecking your liver functions at a lab only visit in 6 weeks to ensure you are metabolizing the medication correctly (LFT, dx hyperlipidemia).  Remainder of labs look good!

## 2023-05-17 NOTE — Telephone Encounter (Signed)
 Lab has been ordered Crestor has been sent in Pt has been notified and agrees to medication Lab appt has been made

## 2023-05-18 NOTE — Assessment & Plan Note (Signed)
 Check labs and replete prn.

## 2023-05-18 NOTE — Assessment & Plan Note (Signed)
Chronic problem, well controlled.  Currently asymptomatic.  Check labs but no anticipated med changes.

## 2023-05-18 NOTE — Assessment & Plan Note (Signed)
 Pt's PE WNL w/ exception of known tremor.  UTD on mammogram, Tdap, PNA.  Flu shot given today.  Check labs.  Anticipatory guidance provided.

## 2023-05-18 NOTE — Assessment & Plan Note (Signed)
 Refilled her Clonazepam to use as needed.

## 2023-05-25 ENCOUNTER — Other Ambulatory Visit: Payer: Self-pay | Admitting: *Deleted

## 2023-05-25 NOTE — Patient Instructions (Signed)
 Visit Information  Thank you for taking time to visit with me today. Please don't hesitate to contact me if I can be of assistance to you before our next scheduled telephone appointment.  Following are the goals we discussed today:   Goals Addressed             This Visit's Progress    RNCM Care Management Expected Outcomes: Monitor, Self-Manage and Reduce Sytmpoms of: HTN, HLD, Pain       Current Barriers:  Knowledge Deficits related to plan of care for management of HTN, HLD, and Pain  Chronic Disease Management support and education needs related to HTN, HLD, and Pain  Cognitive Deficits  RNCM Clinical Goal(s):  Patient will verbalize basic understanding of  HTN, HLD, and Pain disease process and self health management plan as evidenced by verbal explanation, recognizing symptoms and lifestyle modifications. take all medications exactly as prescribed and will call provider for medication related questions as evidenced by compliance with all medications attend all scheduled medical appointments: with primary care provider and specialist as evidenced by keeping all scheduled appointments demonstrate Improved and Ongoing adherence to prescribed treatment plan for HTN, HLD, and Pain as evidenced by consistent medication compliance, symptom monitoring and continued lifestyle modifications continue to work with RN Care Manager to address care management and care coordination needs related to  HTN, HLD, and Pain as evidenced by adherence to CM Team Scheduled appointments through collaboration with RN Care manager, provider, and care team.   Interventions: Evaluation of current treatment plan related to  self management and patient's adherence to plan as established by provider   Hyperlipidemia Interventions:  (Status:  Goal on track:  Yes.) Long Term Goal Medication review performed; medication list updated in electronic medical record.  Provider established cholesterol goals  reviewed Counseled on importance of regular laboratory monitoring as prescribed Provided HLD educational materials Reviewed importance of limiting foods high in cholesterol. Patient reports that she has cut eating fried foods and mainly eats grilled or baked foods. She reports that they are eating at home mostly. Reviewed exercise goals and target of 150 minutes per week Screening for signs and symptoms of depression related to chronic disease state Assessed social determinant of health barriers   Hypertension Interventions:  (Status:  Goal on track:  Yes.) Long Term Goal Last practice recorded BP readings:  BP Readings from Last 3 Encounters:  05/14/23 136/68  11/23/22 106/68  11/06/22 116/70   Most recent eGFR/CrCl:  Lab Results  Component Value Date   EGFR 53 (L) 04/21/2022    No components found for: CRCL  Evaluation of current treatment plan related to hypertension self management and patient's adherence to plan as established by provider Provided education to patient re: stroke prevention, s/s of heart attack and stroke Reviewed medications with patient and discussed importance of compliance Counseled on adverse effects of illicit drug and excessive alcohol use in patients with high blood pressure  Discussed plans with patient for ongoing care management follow up and provided patient with direct contact information for care management team Advised patient, providing education and rationale, to monitor blood pressure daily and record, calling PCP for findings outside established parameters. Regularly checks blood pressure but does not keep a log. RNCM advised to keep log of blood pressures. Patient reports reading of 145/58. RNCM reviewed with patient goal SBP< 140 and DBP<90. Reviewed scheduled/upcoming provider appointments including: 11-11-2023 with PCP Discussed complications of poorly controlled blood pressure such as heart disease, stroke, circulatory  complications, vision  complications, kidney impairment, sexual dysfunction  Pain Interventions:  (Status:  New goal. and Goal on track:  Yes.) Long Term Goal Pain assessment performed 0/10 Medications reviewed Reviewed provider established plan for pain management. Reports using tylenol  arthritis and Voltaren gel Counseled on the importance of reporting any/all new or changed pain symptoms or management strategies to pain management provider Discussed use of relaxation techniques and/or diversional activities to assist with pain reduction (distraction, imagery, relaxation, massage, acupressure, TENS, heat, and cold application Reviewed with patient prescribed pharmacological and nonpharmacological pain relief strategies Advised patient to discuss any new changes with provider   Patient Goals/Self-Care Activities: Take all medications as prescribed Attend all scheduled provider appointments Call pharmacy for medication refills 3-7 days in advance of running out of medications Attend church or other social activities Perform all self care activities independently  Call provider office for new concerns or questions  check blood pressure daily write blood pressure results in a log or diary call doctor with any symptoms you believe are related to your medicine  Follow Up Plan:  Telephone follow up appointment with care management team member scheduled for:  07-22-2023 at 1:00 pm          Our next appointment is by telephone on 07-22-2023 at 1:00 pm  Please call the care guide team at [phone] if you need to cancel or reschedule your appointment.   If you are experiencing a Mental Health or Behavioral Health Crisis or need someone to talk to, please call the Suicide and Crisis Lifeline: 988 call the [place]  National Suicide Prevention Lifeline: 760-398-4333 or TTY: 562-542-8183 TTY 559-307-1372) to talk to a trained counselor call 1-800-273-TALK (toll free, 24 hour hotline) call 911   Patient verbalizes  understanding of instructions and care plan provided today and agrees to view in MyChart. Active MyChart status and patient understanding of how to access instructions and care plan via MyChart confirmed with patient.     Telephone follow up appointment with care management team member scheduled for:07-22-2023 at 1:00 pm  Rosina Forte, BSN RN RN Care Manager  [facility]  Ambulatory Care Management  Direct Number: [phone]

## 2023-05-25 NOTE — Patient Outreach (Signed)
 Care Management   Visit Note  05/25/2023 Name: JAIDENCE GEISLER MRN: [id number] DOB: Dec 31, 1929  Subjective: Rachel Walton is a 89 y.o. year old female who is a primary care patient of Tabori, Katherine E, MD. The Care Management team was consulted for assistance.      Engaged with patient spoke with patient by telephone.    Goals Addressed             This Visit's Progress    RNCM Care Management Expected Outcomes: Monitor, Self-Manage and Reduce Sytmpoms of: HTN, HLD, Pain       Current Barriers:  Knowledge Deficits related to plan of care for management of HTN, HLD, and Pain  Chronic Disease Management support and education needs related to HTN, HLD, and Pain  Cognitive Deficits  RNCM Clinical Goal(s):  Patient will verbalize basic understanding of  HTN, HLD, and Pain disease process and self health management plan as evidenced by verbal explanation, recognizing symptoms and lifestyle modifications. take all medications exactly as prescribed and will call provider for medication related questions as evidenced by compliance with all medications attend all scheduled medical appointments: with primary care provider and specialist as evidenced by keeping all scheduled appointments demonstrate Improved and Ongoing adherence to prescribed treatment plan for HTN, HLD, and Pain as evidenced by consistent medication compliance, symptom monitoring and continued lifestyle modifications continue to work with RN Care Manager to address care management and care coordination needs related to  HTN, HLD, and Pain as evidenced by adherence to CM Team Scheduled appointments through collaboration with RN Care manager, provider, and care team.   Interventions: Evaluation of current treatment plan related to  self management and patient's adherence to plan as established by provider   Hyperlipidemia Interventions:  (Status:  Goal on track:  Yes.) Long Term Goal Medication review performed; medication  list updated in electronic medical record.  Provider established cholesterol goals reviewed Counseled on importance of regular laboratory monitoring as prescribed Provided HLD educational materials Reviewed importance of limiting foods high in cholesterol. Patient reports that she has cut eating fried foods and mainly eats grilled or baked foods. She reports that they are eating at home mostly. Reviewed exercise goals and target of 150 minutes per week Screening for signs and symptoms of depression related to chronic disease state Assessed social determinant of health barriers   Hypertension Interventions:  (Status:  Goal on track:  Yes.) Long Term Goal Last practice recorded BP readings:  BP Readings from Last 3 Encounters:  05/14/23 136/68  11/23/22 106/68  11/06/22 116/70   Most recent eGFR/CrCl:  Lab Results  Component Value Date   EGFR 53 (L) 04/21/2022    No components found for: CRCL  Evaluation of current treatment plan related to hypertension self management and patient's adherence to plan as established by provider Provided education to patient re: stroke prevention, s/s of heart attack and stroke Reviewed medications with patient and discussed importance of compliance Counseled on adverse effects of illicit drug and excessive alcohol use in patients with high blood pressure  Discussed plans with patient for ongoing care management follow up and provided patient with direct contact information for care management team Advised patient, providing education and rationale, to monitor blood pressure daily and record, calling PCP for findings outside established parameters. Regularly checks blood pressure but does not keep a log. RNCM advised to keep log of blood pressures. Patient reports reading of 145/58. RNCM reviewed with patient goal SBP< 140 and DBP<90. Reviewed  scheduled/upcoming provider appointments including: 11-11-2023 with PCP Discussed complications of poorly controlled  blood pressure such as heart disease, stroke, circulatory complications, vision complications, kidney impairment, sexual dysfunction  Pain Interventions:  (Status:  New goal. and Goal on track:  Yes.) Long Term Goal Pain assessment performed 0/10 Medications reviewed Reviewed provider established plan for pain management. Reports using tylenol  arthritis and Voltaren gel Counseled on the importance of reporting any/all new or changed pain symptoms or management strategies to pain management provider Discussed use of relaxation techniques and/or diversional activities to assist with pain reduction (distraction, imagery, relaxation, massage, acupressure, TENS, heat, and cold application Reviewed with patient prescribed pharmacological and nonpharmacological pain relief strategies Advised patient to discuss any new changes with provider   Patient Goals/Self-Care Activities: Take all medications as prescribed Attend all scheduled provider appointments Call pharmacy for medication refills 3-7 days in advance of running out of medications Attend church or other social activities Perform all self care activities independently  Call provider office for new concerns or questions  check blood pressure daily write blood pressure results in a log or diary call doctor with any symptoms you believe are related to your medicine  Follow Up Plan:  Telephone follow up appointment with care management team member scheduled for:  07-22-2023 at 1:00 pm          Consent to Services:  Patient was given information about care management services, agreed to services, and gave verbal consent to participate.   Plan: Telephone follow up appointment with care management team member scheduled for:07-22-2023 at 1:00 pm  Rosina Forte, BSN RN RN Care Manager  [facility]  Ambulatory Care Management  Direct Number: [phone]

## 2023-05-31 ENCOUNTER — Telehealth: Payer: Self-pay | Admitting: Family Medicine

## 2023-05-31 NOTE — Telephone Encounter (Signed)
This is FYI form. Placed in folder at nurse station

## 2023-05-31 NOTE — Telephone Encounter (Signed)
Type of form received:[facility]   Additional comments:   Received by:  Form should be Faxed/mailed to: N/A  Is patient requesting call for pickup:N/A  Form placed: [facility] charge sheet.  Provider will determine charge. N/A  Individual made aware of 3-5 business day turn around No?   Just FYI pt wants it her chart.

## 2023-06-01 NOTE — Telephone Encounter (Signed)
Reviewed and sent to scan.

## 2023-06-21 ENCOUNTER — Other Ambulatory Visit (INDEPENDENT_AMBULATORY_CARE_PROVIDER_SITE_OTHER): Payer: Medicare PPO

## 2023-06-21 DIAGNOSIS — E785 Hyperlipidemia, unspecified: Secondary | ICD-10-CM | POA: Diagnosis not present

## 2023-06-22 LAB — HEPATIC FUNCTION PANEL
ALT: 13 U/L (ref 0–35)
AST: 22 U/L (ref 0–37)
Albumin: 4.1 g/dL (ref 3.5–5.2)
Alkaline Phosphatase: 57 U/L (ref 39–117)
Bilirubin, Direct: 0.1 mg/dL (ref 0.0–0.3)
Total Bilirubin: 0.4 mg/dL (ref 0.2–1.2)
Total Protein: 6.9 g/dL (ref 6.0–8.3)

## 2023-06-23 ENCOUNTER — Ambulatory Visit: Payer: Medicare PPO | Admitting: Family Medicine

## 2023-06-23 ENCOUNTER — Telehealth: Payer: Self-pay

## 2023-06-23 ENCOUNTER — Encounter: Payer: Self-pay | Admitting: Family Medicine

## 2023-06-23 NOTE — Telephone Encounter (Signed)
-----   Message from Neena Rhymes sent at 06/23/2023  7:27 AM EST ----- Liver functions look great!  No changes at this time

## 2023-06-23 NOTE — Telephone Encounter (Signed)
Pt has been notified.

## 2023-06-24 ENCOUNTER — Ambulatory Visit: Payer: Medicare PPO | Admitting: Family Medicine

## 2023-06-24 ENCOUNTER — Encounter: Payer: Self-pay | Admitting: Family Medicine

## 2023-06-24 VITALS — BP 114/78 | HR 78 | Temp 98.7°F | Wt 121.4 lb

## 2023-06-24 DIAGNOSIS — R079 Chest pain, unspecified: Secondary | ICD-10-CM | POA: Diagnosis not present

## 2023-06-24 DIAGNOSIS — R5383 Other fatigue: Secondary | ICD-10-CM

## 2023-06-24 DIAGNOSIS — K219 Gastro-esophageal reflux disease without esophagitis: Secondary | ICD-10-CM

## 2023-06-24 MED ORDER — PANTOPRAZOLE SODIUM 40 MG PO TBEC: 40.0000 mg | DELAYED_RELEASE_TABLET | Freq: Every day | ORAL | 3 refills | Status: DC

## 2023-06-24 NOTE — Progress Notes (Signed)
   Subjective:    Patient ID: Rachel Walton, female    DOB: 09-09-1929, 89 y.o.   MRN: [id number]  HPI CP- pt reports epigastric/substernal CP.  Pain is described as a 'tightness'.  Will ease off spontaneously.  Increased belching.  Has some difficulty swallowing.  Increased heartburn.  Currently asymptomatic.  Pain worsens with eating depending on the food- worse w/ acidic or spicy food.  Was previously on Omeprazole 40mg  daily but she stopped this when it seemed that sxs were worsening.     Fatigue- pt reports feeling weak and tired 'all the time'.  No regular exercise.  Pt states that she did PT in [place] previously and felt that this was helpful.  Pt reports sleep is fragmented due to waking up to urinate.     Review of Systems For ROS see HPI     Objective:   Physical Exam Vitals reviewed.  Constitutional:      General: She is not in acute distress.    Appearance: Normal appearance. She is well-developed. She is not ill-appearing.  HENT:     Head: Normocephalic and atraumatic.  Eyes:     Conjunctiva/sclera: Conjunctivae normal.     Pupils: Pupils are equal, round, and reactive to light.  Neck:     Thyroid: No thyromegaly.  Cardiovascular:     Rate and Rhythm: Normal rate and regular rhythm.     Heart sounds: Normal heart sounds. No murmur heard. Pulmonary:     Effort: Pulmonary effort is normal. No respiratory distress.     Breath sounds: Normal breath sounds.  Abdominal:     General: There is no distension.     Palpations: Abdomen is soft.     Tenderness: There is no abdominal tenderness.  Musculoskeletal:     Cervical back: Normal range of motion and neck supple.  Lymphadenopathy:     Cervical: No cervical adenopathy.  Skin:    General: Skin is warm and dry.  Neurological:     Mental Status: She is alert and oriented to person, place, and time.  Psychiatric:        Mood and Affect: Mood normal.        Behavior: Behavior normal.           Assessment &  Plan:  Chest pain- new.  Thankfully pt's EKG WNL.  She reports epigastric and substernal pain, increased indigestion, increased belching.  Notes pain is worse w/ spicy or acidic foods.  She is not currently on PPI.  Start Pantoprazole as she said Omeprazole worsened her sxs.  Encouraged her to avoid acidic or spicy foods.  If no improvement on PPI recommended she call Dr Rhea Belton.  She was also told that if her chest pain changed or worsened, she needs to go to the ER for evaluation.  Pt expressed understanding and is in agreement w/ plan.   GERD- see above.  Start Pantoprazole.  Fatigue- new.  Pt notes fragmented sleep due to need to get up and urinate throughout the night.  Feels that she has more energy when she is physically active.  Really enjoyed doing PT in [place] and felt that it made her stronger and more energetic.  Asking for referral.  Referral placed.  Reviewed recent labs w/ her- no obvious cause of fatigue.  Discussed that this is not unusual as we age.  Will follow.

## 2023-06-24 NOTE — Patient Instructions (Addendum)
Follow up in 3 months to recheck energy and reflux We'll call you to schedule your physical therapy appt in [place] Try and be active throughout the day to keep your energy level up START the Pantoprazole daily to help w/ heartburn/reflux/indigestion If the chest pain changes or worsens- please go to the ER for a complete heart work up Call with any questions or concerns Hang in there!

## 2023-06-30 ENCOUNTER — Ambulatory Visit: Payer: Medicare PPO

## 2023-07-07 ENCOUNTER — Other Ambulatory Visit: Payer: Self-pay

## 2023-07-07 ENCOUNTER — Ambulatory Visit: Payer: Medicare PPO | Attending: Family Medicine

## 2023-07-07 DIAGNOSIS — R5383 Other fatigue: Secondary | ICD-10-CM | POA: Diagnosis not present

## 2023-07-07 DIAGNOSIS — M6281 Muscle weakness (generalized): Secondary | ICD-10-CM | POA: Insufficient documentation

## 2023-07-07 NOTE — Therapy (Signed)
 OUTPATIENT PHYSICAL THERAPY NEURO EVALUATION   Patient Name: Rachel Walton MRN: [id number] DOB:14-Aug-1929, 89 y.o., female Today's Date: 07/07/2023  REFERRING PROVIDER: Sheliah Hatch, MD   END OF SESSION:  PT End of Session - 07/07/23 1313     Visit Number 1    Number of Visits 12    Date for PT Re-Evaluation 09/03/23    PT Start Time 1310    PT Stop Time 1334    PT Time Calculation (min) 24 min    Activity Tolerance Patient tolerated treatment well    Behavior During Therapy WFL for tasks assessed/performed             Past Medical History:  Diagnosis Date   Acute gastric ulcer    Allergy    Anxiety    Atrophic vaginitis    Barrett's esophagus    Basal cell carcinoma of nose     under nose   Cataract    Depression    Diverticulosis    Esophageal stricture    Esophageal ulcer    Fatty liver    Gall stones 06/15/2013   GERD (gastroesophageal reflux disease)    Heart murmur    Hiatal hernia    Hx of adenomatous colonic polyps    Hyperlipidemia    Hypertension    Hypokalemia    IBS (irritable bowel syndrome)    Kyphosis    Lymphocytic colitis    Macular degeneration    "?wet or dry; ? 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    Tremor    Vitamin D deficiency    Past Surgical History:  Procedure Laterality Date   ABDOMINAL HYSTERECTOMY  1980   APPENDECTOMY     BACK SURGERY     BREAST BIOPSY Left    CARPAL TUNNEL RELEASE Left 09/13/2020   Procedure: CARPAL TUNNEL RELEASE;  Surgeon: Dominica Severin, MD;  Location: [facility] OR;  Service: Orthopedics;  Laterality: Left;   CATARACT EXTRACTION W/ INTRAOCULAR LENS  IMPLANT, BILATERAL Bilateral    COLONOSCOPY     ESOPHAGOGASTRODUODENOSCOPY (EGD) WITH ESOPHAGEAL DILATION  X 1   EYE SURGERY     LUMBAR DISC SURGERY     "fragmented disc"   ORIF RADIAL FRACTURE Left  09/13/2020   Procedure: HARDWARE REMOVAL, OPEN REDUCTION INTERNAL FIXATION LEFT RADIUS;  Surgeon: Dominica Severin, MD;  Location: [facility] OR;  Service: Orthopedics;  Laterality: Left;  Block with IV sedation   POLYPECTOMY     SQUAMOUS CELL CARCINOMA EXCISION     nose tip   Patient Active Problem List   Diagnosis Date Noted   Physical exam 05/14/2023   Memory difficulties 01/18/2021   Anxiety 01/18/2021   Vitamin D deficiency 03/06/2020   Osteopenia with high risk of fracture 10/21/2016   Essential tremor 05/20/2016   Age-related macular degeneration, dry, both eyes 04/07/2016   Barrett's esophagus 12/20/2015   Hypokalemia 10/09/2015   Blepharoptosis, acquired, 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   Seasonal allergies 12/11/2013   Essential hypertension 12/11/2013   Amblyopia ex anopsia of right eye 12/07/2013   Degenerative drusen 05/05/2013   Mixed hyperlipidemia 11/21/2012   Colon polyp    Kyphosis    Atrophic vaginitis    Diverticulosis     ONSET DATE: years ago   REFERRING DIAG: Fatigue, unspecified type, Weakness  THERAPY DIAG:  Muscle weakness (generalized)  Rationale for Evaluation and Treatment: Rehabilitation  SUBJECTIVE:                                                                                                                                                                                             SUBJECTIVE STATEMENT: Patient reports that she has fallen multiple times over the years and due to these falls she is now afraid of falling. She has not fallen recently, but she has noticed that she moves slower due to this fear.  Pt accompanied by: self  PERTINENT HISTORY: hypertension, essential tremor, osteopenia, allergies, macular degeneration, memory difficulty, anxiety, and depression  PAIN:  Are you having pain? Yes: NPRS scale: 5-6/10 Pain location: both arms  PRECAUTIONS: Fall  RED FLAGS: None   WEIGHT BEARING RESTRICTIONS: No  FALLS: Has patient fallen in  last 6 months? No  LIVING ENVIRONMENT: Lives with: lives with their spouse Lives in: House/apartment Stairs: Yes: Internal: 14 steps; can reach both; she goes down her steps sideways Has following equipment at home: None  PLOF: Independent  PATIENT GOALS: improved safety, strength, and fall risk   OBJECTIVE:  Note: Objective measures were completed at Evaluation unless otherwise noted.  COGNITION: Overall cognitive status: Within functional limits for tasks assessed   SENSATION: Patient reports that "my hands don't feel right"   COORDINATION: WFL for activities assessed  EDEMA:  No edema observed  MUSCLE TONE:   WFL   LOWER EXTREMITY ROM: WFL for activities assessed  LOWER EXTREMITY MMT:    MMT Right Eval Left Eval  Hip flexion 3/5 3/5  Hip extension    Hip abduction    Hip adduction    Hip internal rotation    Hip external rotation    Knee flexion 4-/5 3+/5  Knee extension 4/5 4-/5  Ankle dorsiflexion 3/5 3/5  Ankle plantarflexion    Ankle inversion    Ankle eversion    (Blank rows = not tested)  TRANSFERS: Assistive device utilized: None  Sit to stand: Complete Independence Stand to sit: Complete Independence  GAIT: Gait pattern: decreased arm swing- Right, decreased arm swing- Left, decreased stride length, Right foot flat, Left foot flat, shuffling, poor foot clearance- Right, and poor foot clearance- Left Assistive device utilized: None Level of assistance: Complete Independence Gait speed: 0.72 m/s    FUNCTIONAL TESTS:  5 times sit to stand: 29.03 seconds without upper extremity support Timed up and go (TUG): 19.13 seconds  TREATMENT DATE:                                     07/07/23 EXERCISE LOG  Exercise Repetitions and Resistance Comments  Nustep  L4 x 6 minutes                    Blank cell = exercise not performed today    PATIENT EDUCATION: Education details: plan of care, prognosis, objective findings, and goals for physical therapy Person educated: Patient Education method: Explanation Education comprehension: verbalized understanding  HOME EXERCISE PROGRAM:   GOALS: Goals reviewed with patient? Yes  SHORT TERM GOALS: Target date: 07/28/23  Patient will be independent with her initial HEP.  Baseline: Goal status: INITIAL  2.  Patient will improve her five time sit to stand time to 20 seconds or less for improved lower extremity power.  Baseline:  Goal status: INITIAL  3.  Patient will improve her timed up and go time to 12 seconds or less to reduce her fall risk.  Baseline:  Goal status: INITIAL  LONG TERM GOALS: Target date: 08/18/23  Patient will be independent with her advanced HEP.  Baseline:  Goal status: INITIAL  2.  Patient will improve her five time sit to stand time to 15 seconds or less to reduce her fall risk.  Baseline:  Goal status: INITIAL  3.  Patient will improve her gait speed to 1.0 m/s or greater for improved community mobility.  Baseline:  Goal status: INITIAL  4.  Patient will improve her bilateral hip flexor strength to at least 3+/5 for improved functional mobility.  Baseline:  Goal status: INITIAL  5.  Patient will be able to safely navigate at least 4 steps with a step to or reciprocal pattern for improved household mobility.  Baseline:  Goal status: INITIAL  ASSESSMENT:  CLINICAL IMPRESSION: Patient is a 89 y.o. female who was seen today for physical therapy evaluation and treatment for deconditioning. She exhibited significant lower extremity muscular strength bilaterally. She is also at an elevated risk of falling as evidenced by her objective measures and gait deviations. Recommend that she continue with skilled physical therapy to address her impairments to maximize her safety and functional mobility.    OBJECTIVE IMPAIRMENTS: Abnormal gait,  decreased activity tolerance, decreased balance, decreased endurance, decreased mobility, difficulty walking, and decreased strength.   ACTIVITY LIMITATIONS: stairs, transfers, and locomotion level  PARTICIPATION LIMITATIONS: meal prep, cleaning, laundry, shopping, and community activity  PERSONAL FACTORS: Age, Past/current experiences, Time since onset of injury/illness/exacerbation, and 3+ comorbidities: hypertension, essential tremor, osteopenia, allergies, macular degeneration, memory difficulty, anxiety, and depression  are also affecting patient's functional outcome.   REHAB POTENTIAL: Good  CLINICAL DECISION MAKING: Evolving/moderate complexity  EVALUATION COMPLEXITY: Moderate  PLAN:  PT FREQUENCY: 2x/week  PT DURATION: 6 weeks  PLANNED INTERVENTIONS: 97164- PT Re-evaluation, 97110-Therapeutic exercises, 97530- Therapeutic activity, 97112- Neuromuscular re-education, 97535- Self Care, 08657- Manual therapy, [phone]- Gait training, Patient/Family education, Balance training, and Stair training  PLAN FOR NEXT SESSION: Nustep, lower extremity strengthening, gait training, and balance interventions   Granville Lewis, PT 07/07/2023, 3:19 PM

## 2023-07-12 ENCOUNTER — Ambulatory Visit: Payer: Medicare PPO | Attending: Family Medicine

## 2023-07-12 DIAGNOSIS — M6281 Muscle weakness (generalized): Secondary | ICD-10-CM | POA: Insufficient documentation

## 2023-07-12 NOTE — Therapy (Signed)
 OUTPATIENT PHYSICAL THERAPY NEURO TREATMENT   Patient Name: Rachel Walton MRN: [id number] DOB:24-May-1929, 89 y.o., female Today's Date: 07/12/2023  REFERRING PROVIDER: Sheliah Hatch, MD   END OF SESSION:  PT End of Session - 07/12/23 1259     Visit Number 2    Number of Visits 12    Date for PT Re-Evaluation 09/03/23    PT Start Time 1259    PT Stop Time 1345    PT Time Calculation (min) 46 min    Activity Tolerance Patient tolerated treatment well    Behavior During Therapy WFL for tasks assessed/performed              Past Medical History:  Diagnosis Date   Acute gastric ulcer    Allergy    Anxiety    Atrophic vaginitis    Barrett's esophagus    Basal cell carcinoma of nose     under nose   Cataract    Depression    Diverticulosis    Esophageal stricture    Esophageal ulcer    Fatty liver    Gall stones 06/15/2013   GERD (gastroesophageal reflux disease)    Heart murmur    Hiatal hernia    Hx of adenomatous colonic polyps    Hyperlipidemia    Hypertension    Hypokalemia    IBS (irritable bowel syndrome)    Kyphosis    Lymphocytic colitis    Macular degeneration    "?wet or dry; ? 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    Tremor    Vitamin D deficiency    Past Surgical History:  Procedure Laterality Date   ABDOMINAL HYSTERECTOMY  1980   APPENDECTOMY     BACK SURGERY     BREAST BIOPSY Left    CARPAL TUNNEL RELEASE Left 09/13/2020   Procedure: CARPAL TUNNEL RELEASE;  Surgeon: Dominica Severin, MD;  Location: [facility];  Service: Orthopedics;  Laterality: Left;   CATARACT EXTRACTION W/ INTRAOCULAR LENS  IMPLANT, BILATERAL Bilateral    COLONOSCOPY     ESOPHAGOGASTRODUODENOSCOPY (EGD) WITH ESOPHAGEAL DILATION  X 1   EYE SURGERY     LUMBAR DISC SURGERY     "fragmented disc"   ORIF RADIAL FRACTURE Left  09/13/2020   Procedure: HARDWARE REMOVAL, OPEN REDUCTION INTERNAL FIXATION LEFT RADIUS;  Surgeon: Dominica Severin, MD;  Location: [facility];  Service: Orthopedics;  Laterality: Left;  Block with IV sedation   POLYPECTOMY     SQUAMOUS CELL CARCINOMA EXCISION     nose tip   Patient Active Problem List   Diagnosis Date Noted   Physical exam 05/14/2023   Memory difficulties 01/18/2021   Anxiety 01/18/2021   Vitamin D deficiency 03/06/2020   Osteopenia with high risk of fracture 10/21/2016   Essential tremor 05/20/2016   Age-related macular degeneration, dry, both eyes 04/07/2016   Barrett's esophagus 12/20/2015   Hypokalemia 10/09/2015   Blepharoptosis, acquired, 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   Seasonal allergies 12/11/2013   Essential hypertension 12/11/2013   Amblyopia ex anopsia of right eye 12/07/2013   Degenerative drusen 05/05/2013   Mixed hyperlipidemia 11/21/2012   Colon polyp    Kyphosis    Atrophic vaginitis    Diverticulosis     ONSET DATE: years ago   REFERRING DIAG: Fatigue, unspecified type, Weakness  THERAPY DIAG:  Muscle weakness (generalized)  Rationale for Evaluation and Treatment: Rehabilitation  SUBJECTIVE:                                                                                                                                                                                             SUBJECTIVE STATEMENT: Patient reports that she feels alright today.  Pt accompanied by: self  PERTINENT HISTORY: hypertension, essential tremor, osteopenia, allergies, macular degeneration, memory difficulty, anxiety, and depression  PAIN:  Are you having pain? Yes: NPRS scale: no pain score provided Pain location: both arms  PRECAUTIONS: Fall  RED FLAGS: None   WEIGHT BEARING RESTRICTIONS: No  FALLS: Has patient fallen in last 6 months? No  LIVING ENVIRONMENT: Lives with: lives with their spouse Lives in: House/apartment Stairs: Yes: Internal: 14 steps; can reach  both; she goes down her steps sideways Has following equipment at home: None  PLOF: Independent  PATIENT GOALS: improved safety, strength, and fall risk   OBJECTIVE:  Note: Objective measures were completed at Evaluation unless otherwise noted.  COGNITION: Overall cognitive status: Within functional limits for tasks assessed   SENSATION: Patient reports that "my hands don't feel right"   COORDINATION: WFL for activities assessed  EDEMA:  No edema observed  MUSCLE TONE:   WFL   LOWER EXTREMITY ROM: WFL for activities assessed  LOWER EXTREMITY MMT:    MMT Right Eval Left Eval  Hip flexion 3/5 3/5  Hip extension    Hip abduction    Hip adduction    Hip internal rotation    Hip external rotation    Knee flexion 4-/5 3+/5  Knee extension 4/5 4-/5  Ankle dorsiflexion 3/5 3/5  Ankle plantarflexion    Ankle inversion    Ankle eversion    (Blank rows = not tested)  TRANSFERS: Assistive device utilized: None  Sit to stand: Complete Independence Stand to sit: Complete Independence  GAIT: Gait pattern: decreased arm swing- Right, decreased arm swing- Left, decreased stride length, Right foot flat, Left foot flat, shuffling, poor foot clearance- Right, and poor foot clearance- Left Assistive device utilized: None Level of assistance: Complete Independence Gait speed: 0.72 m/s    FUNCTIONAL TESTS:  5 times sit to stand: 29.03 seconds without upper extremity support Timed up and go (TUG): 19.13 seconds  TREATMENT DATE:                                     07/12/23 EXERCISE LOG  Exercise Repetitions and Resistance Comments  Nustep  L3 x 15 minutes   Seated hip ADD isometric  3 minutes w/ 5 second hold   LAQ 3# x 2.5 minutes Alternating LE   Seated marching  3# x 2 minutes   Marching on foam  2 x 1 minutes  BUE support from parallel bars   Rocker board  3  minutes  BUE support from parallel bars  Sit to stand  5 reps each From mat table   Blank cell = exercise not performed today                                    07/07/23 EXERCISE LOG  Exercise Repetitions and Resistance Comments  Nustep  L4 x 6 minutes                    Blank cell = exercise not performed today   PATIENT EDUCATION: Education details: expectation for soreness Person educated: Patient Education method: Explanation Education comprehension: verbalized understanding  HOME EXERCISE PROGRAM:   GOALS: Goals reviewed with patient? Yes  SHORT TERM GOALS: Target date: 07/28/23  Patient will be independent with her initial HEP.  Baseline: Goal status: INITIAL  2.  Patient will improve her five time sit to stand time to 20 seconds or less for improved lower extremity power.  Baseline:  Goal status: INITIAL  3.  Patient will improve her timed up and go time to 12 seconds or less to reduce her fall risk.  Baseline:  Goal status: INITIAL  LONG TERM GOALS: Target date: 08/18/23  Patient will be independent with her advanced HEP.  Baseline:  Goal status: INITIAL  2.  Patient will improve her five time sit to stand time to 15 seconds or less to reduce her fall risk.  Baseline:  Goal status: INITIAL  3.  Patient will improve her gait speed to 1.0 m/s or greater for improved community mobility.  Baseline:  Goal status: INITIAL  4.  Patient will improve her bilateral hip flexor strength to at least 3+/5 for improved functional mobility.  Baseline:  Goal status: INITIAL  5.  Patient will be able to safely navigate at least 4 steps with a step to or reciprocal pattern for improved household mobility.  Baseline:  Goal status: INITIAL  ASSESSMENT:  CLINICAL IMPRESSION: Patient was introduced to multiple new interventions for improved lower extremity strength and stability with moderate difficulty and fatigue. She required minimal cueing with today's new  intervention for proper exercise performance to promote proper muscular engagement. She required a brief seated rest break after today's standing interventions due to muscular fatigue.  She reported feeling fine upon the conclusion of treatment.  She continues to require skilled physical therapy to address her remaining impairments to maximize her safety and functional mobility.  OBJECTIVE IMPAIRMENTS: Abnormal gait, decreased activity tolerance, decreased balance, decreased endurance, decreased mobility, difficulty walking, and decreased strength.   ACTIVITY LIMITATIONS: stairs, transfers, and locomotion level  PARTICIPATION LIMITATIONS: meal prep, cleaning, laundry, shopping, and community activity  PERSONAL FACTORS: Age, Past/current experiences, Time since onset of injury/illness/exacerbation, and 3+ comorbidities: hypertension, essential tremor, osteopenia, allergies, macular degeneration, memory  difficulty, anxiety, and depression  are also affecting patient's functional outcome.   REHAB POTENTIAL: Good  CLINICAL DECISION MAKING: Evolving/moderate complexity  EVALUATION COMPLEXITY: Moderate  PLAN:  PT FREQUENCY: 2x/week  PT DURATION: 6 weeks  PLANNED INTERVENTIONS: 97164- PT Re-evaluation, 97110-Therapeutic exercises, 97530- Therapeutic activity, 97112- Neuromuscular re-education, 97535- Self Care, 40981- Manual therapy, [phone]- Gait training, Patient/Family education, Balance training, and Stair training  PLAN FOR NEXT SESSION: Nustep, lower extremity strengthening, gait training, and balance interventions   Granville Lewis, PT 07/12/2023, 4:59 PM

## 2023-07-14 ENCOUNTER — Ambulatory Visit: Payer: Medicare PPO

## 2023-07-14 DIAGNOSIS — M6281 Muscle weakness (generalized): Secondary | ICD-10-CM | POA: Diagnosis not present

## 2023-07-14 NOTE — Therapy (Signed)
 OUTPATIENT PHYSICAL THERAPY NEURO TREATMENT   Patient Name: Rachel Walton MRN: [id number] DOB:04/04/1930, 89 y.o., female Today's Date: 07/14/2023  REFERRING PROVIDER: Sheliah Hatch, MD   END OF SESSION:  PT End of Session - 07/14/23 1306     Visit Number 3    Number of Visits 12    Date for PT Re-Evaluation 09/03/23    Authorization - Number of Visits 10    PT Start Time 1303    PT Stop Time 1345    PT Time Calculation (min) 42 min    Activity Tolerance Patient tolerated treatment well    Behavior During Therapy WFL for tasks assessed/performed               Past Medical History:  Diagnosis Date   Acute gastric ulcer    Allergy    Anxiety    Atrophic vaginitis    Barrett's esophagus    Basal cell carcinoma of nose     under nose   Cataract    Depression    Diverticulosis    Esophageal stricture    Esophageal ulcer    Fatty liver    Gall stones 06/15/2013   GERD (gastroesophageal reflux disease)    Heart murmur    Hiatal hernia    Hx of adenomatous colonic polyps    Hyperlipidemia    Hypertension    Hypokalemia    IBS (irritable bowel syndrome)    Kyphosis    Lymphocytic colitis    Macular degeneration    "?wet or dry; ? 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    Tremor    Vitamin D deficiency    Past Surgical History:  Procedure Laterality Date   ABDOMINAL HYSTERECTOMY  1980   APPENDECTOMY     BACK SURGERY     BREAST BIOPSY Left    CARPAL TUNNEL RELEASE Left 09/13/2020   Procedure: CARPAL TUNNEL RELEASE;  Surgeon: Dominica Severin, MD;  Location: [facility] OR;  Service: Orthopedics;  Laterality: Left;   CATARACT EXTRACTION W/ INTRAOCULAR LENS  IMPLANT, BILATERAL Bilateral    COLONOSCOPY     ESOPHAGOGASTRODUODENOSCOPY (EGD) WITH ESOPHAGEAL DILATION  X 1   EYE SURGERY     LUMBAR DISC SURGERY      "fragmented disc"   ORIF RADIAL FRACTURE Left 09/13/2020   Procedure: HARDWARE REMOVAL, OPEN REDUCTION INTERNAL FIXATION LEFT RADIUS;  Surgeon: Dominica Severin, MD;  Location: [facility] OR;  Service: Orthopedics;  Laterality: Left;  Block with IV sedation   POLYPECTOMY     SQUAMOUS CELL CARCINOMA EXCISION     nose tip   Patient Active Problem List   Diagnosis Date Noted   Physical exam 05/14/2023   Memory difficulties 01/18/2021   Anxiety 01/18/2021   Vitamin D deficiency 03/06/2020   Osteopenia with high risk of fracture 10/21/2016   Essential tremor 05/20/2016   Age-related macular degeneration, dry, both eyes 04/07/2016   Barrett's esophagus 12/20/2015   Hypokalemia 10/09/2015   Blepharoptosis, acquired, 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   Seasonal allergies 12/11/2013   Essential hypertension 12/11/2013   Amblyopia ex anopsia of right eye 12/07/2013   Degenerative drusen 05/05/2013   Mixed hyperlipidemia 11/21/2012   Colon polyp    Kyphosis    Atrophic vaginitis    Diverticulosis     ONSET DATE:  years ago   REFERRING DIAG: Fatigue, unspecified type, Weakness   THERAPY DIAG:  Muscle weakness (generalized)  Rationale for Evaluation and Treatment: Rehabilitation  SUBJECTIVE:                                                                                                                                                                                             SUBJECTIVE STATEMENT: Patient reports that she feels fine today. She also felt "alright" after her last appointment.  Pt accompanied by: self  PERTINENT HISTORY: hypertension, essential tremor, osteopenia, allergies, macular degeneration, memory difficulty, anxiety, and depression  PAIN:  Are you having pain? Yes: NPRS scale: no pain score provided Pain location: both arms  PRECAUTIONS: Fall  RED FLAGS: None   WEIGHT BEARING RESTRICTIONS: No  FALLS: Has patient fallen in last 6 months? No  LIVING ENVIRONMENT: Lives  with: lives with their spouse Lives in: House/apartment Stairs: Yes: Internal: 14 steps; can reach both; she goes down her steps sideways Has following equipment at home: None  PLOF: Independent  PATIENT GOALS: improved safety, strength, and fall risk   OBJECTIVE:  Note: Objective measures were completed at Evaluation unless otherwise noted.  COGNITION: Overall cognitive status: Within functional limits for tasks assessed   SENSATION: Patient reports that "my hands don't feel right"   COORDINATION: WFL for activities assessed  EDEMA:  No edema observed  MUSCLE TONE:   WFL   LOWER EXTREMITY ROM: WFL for activities assessed  LOWER EXTREMITY MMT:    MMT Right Eval Left Eval  Hip flexion 3/5 3/5  Hip extension    Hip abduction    Hip adduction    Hip internal rotation    Hip external rotation    Knee flexion 4-/5 3+/5  Knee extension 4/5 4-/5  Ankle dorsiflexion 3/5 3/5  Ankle plantarflexion    Ankle inversion    Ankle eversion    (Blank rows = not tested)  TRANSFERS: Assistive device utilized: None  Sit to stand: Complete Independence Stand to sit: Complete Independence  GAIT: Gait pattern: decreased arm swing- Right, decreased arm swing- Left, decreased stride length, Right foot flat, Left foot flat, shuffling, poor foot clearance- Right, and poor foot clearance- Left Assistive device utilized: None Level of assistance: Complete Independence Gait speed: 0.72 m/s    FUNCTIONAL TESTS:  5 times sit to stand: 29.03 seconds without upper extremity support Timed up and go (TUG): 19.13 seconds  TREATMENT DATE:                                     07/14/23 EXERCISE LOG  Exercise Repetitions and Resistance Comments  Nustep  L3 x 16 minutes   Seated hip ADD isometric  3 minutes w/ 5 seconds   Seated clams  Red t-band x 3 minutes   LAQ 3# x 3 minutes  Alternating LE  Seated marching  3# x 2.5 minutes   Sit to stand  12 reps With UE support from armrests  Rocker board  4 minutes BUE support from parallel bars  Standing HS curl on foam  2 x 1 minute BUE support from parallel bars   Blank cell = exercise not performed today                                    07/12/23 EXERCISE LOG  Exercise Repetitions and Resistance Comments  Nustep  L3 x 15 minutes   Seated hip ADD isometric  3 minutes w/ 5 second hold   LAQ 3# x 2.5 minutes Alternating LE   Seated marching  3# x 2 minutes   Marching on foam  2 x 1 minutes  BUE support from parallel bars   Rocker board  3 minutes  BUE support from parallel bars  Sit to stand  5 reps each From mat table   Blank cell = exercise not performed today                                    07/07/23 EXERCISE LOG  Exercise Repetitions and Resistance Comments  Nustep  L4 x 6 minutes                    Blank cell = exercise not performed today   PATIENT EDUCATION: Education details: expectation for soreness Person educated: Patient Education method: Explanation Education comprehension: verbalized understanding  HOME EXERCISE PROGRAM:   GOALS: Goals reviewed with patient? Yes  SHORT TERM GOALS: Target date: 07/28/23  Patient will be independent with her initial HEP.  Baseline: Goal status: INITIAL  2.  Patient will improve her five time sit to stand time to 20 seconds or less for improved lower extremity power.  Baseline:  Goal status: INITIAL  3.  Patient will improve her timed up and go time to 12 seconds or less to reduce her fall risk.  Baseline:  Goal status: INITIAL  LONG TERM GOALS: Target date: 08/18/23  Patient will be independent with her advanced HEP.  Baseline:  Goal status: INITIAL  2.  Patient will improve her five time sit to stand time to 15 seconds or less to reduce her fall risk.  Baseline:  Goal status: INITIAL  3.  Patient will improve her gait speed to 1.0 m/s or  greater for improved community mobility.  Baseline:  Goal status: INITIAL  4.  Patient will improve her bilateral hip flexor strength to at least 3+/5 for improved functional mobility.  Baseline:  Goal status: INITIAL  5.  Patient will be able to safely navigate at least 4 steps with a step to or reciprocal pattern for improved household mobility.  Baseline:  Goal status: INITIAL  ASSESSMENT:  CLINICAL IMPRESSION: Patient was progressed  with seated clams and other familiar interventions for improved lower extremity strength and muscular endurance. She required minimal cueing with today's interventions for proper pacing to avoid a significant increase in lower extremity fatigue. She required upper extremity support from the parallel bars for safety with today's standing interventions. She reported feeling good "like I have been doing something" upon the conclusion of treatment. She continues to require skilled physical therapy to address her remaining impairments to maximize her functional mobility.   OBJECTIVE IMPAIRMENTS: Abnormal gait, decreased activity tolerance, decreased balance, decreased endurance, decreased mobility, difficulty walking, and decreased strength.   ACTIVITY LIMITATIONS: stairs, transfers, and locomotion level  PARTICIPATION LIMITATIONS: meal prep, cleaning, laundry, shopping, and community activity  PERSONAL FACTORS: Age, Past/current experiences, Time since onset of injury/illness/exacerbation, and 3+ comorbidities: hypertension, essential tremor, osteopenia, allergies, macular degeneration, memory difficulty, anxiety, and depression  are also affecting patient's functional outcome.   REHAB POTENTIAL: Good  CLINICAL DECISION MAKING: Evolving/moderate complexity  EVALUATION COMPLEXITY: Moderate  PLAN:  PT FREQUENCY: 2x/week  PT DURATION: 6 weeks  PLANNED INTERVENTIONS: 97164- PT Re-evaluation, 97110-Therapeutic exercises, 97530- Therapeutic activity, 97112-  Neuromuscular re-education, 97535- Self Care, 19147- Manual therapy, [phone]- Gait training, Patient/Family education, Balance training, and Stair training  PLAN FOR NEXT SESSION: Nustep, lower extremity strengthening, gait training, and balance interventions   Granville Lewis, PT 07/14/2023, 1:53 PM

## 2023-07-21 ENCOUNTER — Ambulatory Visit: Admitting: Physical Therapy

## 2023-07-21 DIAGNOSIS — M6281 Muscle weakness (generalized): Secondary | ICD-10-CM

## 2023-07-21 NOTE — Therapy (Signed)
 OUTPATIENT PHYSICAL THERAPY NEURO TREATMENT   Patient Name: Rachel Walton MRN: [id number] DOB:07-25-1929, 89 y.o., female Today's Date: 07/21/2023  REFERRING PROVIDER: Sheliah Hatch, MD   END OF SESSION:  PT End of Session - 07/21/23 1411     Visit Number 4    Number of Visits 12    Date for PT Re-Evaluation 09/03/23    PT Start Time 0151   Running late.   PT Stop Time 0233    PT Time Calculation (min) 42 min    Activity Tolerance Patient tolerated treatment well    Behavior During Therapy WFL for tasks assessed/performed                Past Medical History:  Diagnosis Date   Acute gastric ulcer    Allergy    Anxiety    Atrophic vaginitis    Barrett's esophagus    Basal cell carcinoma of nose     under nose   Cataract    Depression    Diverticulosis    Esophageal stricture    Esophageal ulcer    Fatty liver    Gall stones 06/15/2013   GERD (gastroesophageal reflux disease)    Heart murmur    Hiatal hernia    Hx of adenomatous colonic polyps    Hyperlipidemia    Hypertension    Hypokalemia    IBS (irritable bowel syndrome)    Kyphosis    Lymphocytic colitis    Macular degeneration    "?wet or dry; ? 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    Tremor    Vitamin D deficiency    Past Surgical History:  Procedure Laterality Date   ABDOMINAL HYSTERECTOMY  1980   APPENDECTOMY     BACK SURGERY     BREAST BIOPSY Left    CARPAL TUNNEL RELEASE Left 09/13/2020   Procedure: CARPAL TUNNEL RELEASE;  Surgeon: Dominica Severin, MD;  Location: [facility];  Service: Orthopedics;  Laterality: Left;   CATARACT EXTRACTION W/ INTRAOCULAR LENS  IMPLANT, BILATERAL Bilateral    COLONOSCOPY     ESOPHAGOGASTRODUODENOSCOPY (EGD) WITH ESOPHAGEAL DILATION  X 1   EYE SURGERY     LUMBAR DISC SURGERY     "fragmented disc"   ORIF  RADIAL FRACTURE Left 09/13/2020   Procedure: HARDWARE REMOVAL, OPEN REDUCTION INTERNAL FIXATION LEFT RADIUS;  Surgeon: Dominica Severin, MD;  Location: [facility];  Service: Orthopedics;  Laterality: Left;  Block with IV sedation   POLYPECTOMY     SQUAMOUS CELL CARCINOMA EXCISION     nose tip   Patient Active Problem List   Diagnosis Date Noted   Physical exam 05/14/2023   Memory difficulties 01/18/2021   Anxiety 01/18/2021   Vitamin D deficiency 03/06/2020   Osteopenia with high risk of fracture 10/21/2016   Essential tremor 05/20/2016   Age-related macular degeneration, dry, both eyes 04/07/2016   Barrett's esophagus 12/20/2015   Hypokalemia 10/09/2015   Blepharoptosis, acquired, 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   Seasonal allergies 12/11/2013   Essential hypertension 12/11/2013   Amblyopia ex anopsia of right eye 12/07/2013   Degenerative drusen 05/05/2013   Mixed hyperlipidemia 11/21/2012   Colon polyp    Kyphosis    Atrophic vaginitis    Diverticulosis     ONSET DATE: years ago   REFERRING  DIAG: Fatigue, unspecified type, Weakness   THERAPY DIAG:  Muscle weakness (generalized)  Rationale for Evaluation and Treatment: Rehabilitation  SUBJECTIVE:                                                                                                                                                                                             SUBJECTIVE STATEMENT: Patient reports that she feels fine today. She also felt "alright" after her last appointment.  Pt accompanied by: self  PERTINENT HISTORY: hypertension, essential tremor, osteopenia, allergies, macular degeneration, memory difficulty, anxiety, and depression  PAIN:  Are you having pain? Yes: NPRS scale: no pain score provided Pain location: both arms  PRECAUTIONS: Fall  RED FLAGS: None   WEIGHT BEARING RESTRICTIONS: No  FALLS: Has patient fallen in last 6 months? No  LIVING ENVIRONMENT: Lives with: lives with their  spouse Lives in: House/apartment Stairs: Yes: Internal: 14 steps; can reach both; she goes down her steps sideways Has following equipment at home: None  PLOF: Independent  PATIENT GOALS: improved safety, strength, and fall risk   OBJECTIVE:  Note: Objective measures were completed at Evaluation unless otherwise noted.  COGNITION: Overall cognitive status: Within functional limits for tasks assessed   SENSATION: Patient reports that "my hands don't feel right"   COORDINATION: WFL for activities assessed  EDEMA:  No edema observed  MUSCLE TONE:   WFL   LOWER EXTREMITY ROM: WFL for activities assessed  LOWER EXTREMITY MMT:    MMT Right Eval Left Eval  Hip flexion 3/5 3/5  Hip extension    Hip abduction    Hip adduction    Hip internal rotation    Hip external rotation    Knee flexion 4-/5 3+/5  Knee extension 4/5 4-/5  Ankle dorsiflexion 3/5 3/5  Ankle plantarflexion    Ankle inversion    Ankle eversion    (Blank rows = not tested)  TRANSFERS: Assistive device utilized: None  Sit to stand: Complete Independence Stand to sit: Complete Independence  GAIT: Gait pattern: decreased arm swing- Right, decreased arm swing- Left, decreased stride length, Right foot flat, Left foot flat, shuffling, poor foot clearance- Right, and poor foot clearance- Left Assistive device utilized: None Level of assistance: Complete Independence Gait speed: 0.72 m/s    FUNCTIONAL TESTS:  5 times sit to stand: 29.03 seconds without upper extremity support Timed up and go (TUG): 19.13 seconds  TREATMENT DATE:    07/20/23:                                       EXERCISE LOG  Exercise Repetitions and Resistance Comments  Nustep Level 3 x 20 minutes   Rockerboard  4 minutes in parallel bars   Ball squeezes 3 minutes.   Hip abd 3 minutes with red theraband   Resisted 4-way  walking Resisted with blue tubing (XTS) 2 minutes each way (8 minutes total).                                       07/14/23 EXERCISE LOG  Exercise Repetitions and Resistance Comments  Nustep  L3 x 16 minutes   Seated hip ADD isometric  3 minutes w/ 5 seconds   Seated clams  Red t-band x 3 minutes   LAQ 3# x 3 minutes Alternating LE  Seated marching  3# x 2.5 minutes   Sit to stand  12 reps With UE support from armrests  Rocker board  4 minutes BUE support from parallel bars  Standing HS curl on foam  2 x 1 minute BUE support from parallel bars   Blank cell = exercise not performed today                                    07/12/23 EXERCISE LOG  Exercise Repetitions and Resistance Comments  Nustep  L3 x 15 minutes   Seated hip ADD isometric  3 minutes w/ 5 second hold   LAQ 3# x 2.5 minutes Alternating LE   Seated marching  3# x 2 minutes   Marching on foam  2 x 1 minutes  BUE support from parallel bars   Rocker board  3 minutes  BUE support from parallel bars  Sit to stand  5 reps each From mat table   Blank cell = exercise not performed today                                    07/07/23 EXERCISE LOG  Exercise Repetitions and Resistance Comments  Nustep  L4 x 6 minutes                    Blank cell = exercise not performed today   PATIENT EDUCATION: Education details: expectation for soreness Person educated: Patient Education method: Explanation Education comprehension: verbalized understanding  HOME EXERCISE PROGRAM:   GOALS: Goals reviewed with patient? Yes  SHORT TERM GOALS: Target date: 07/28/23  Patient will be independent with her initial HEP.  Baseline: Goal status: INITIAL  2.  Patient will improve her five time sit to stand time to 20 seconds or less for improved lower extremity power.  Baseline:  Goal status: INITIAL  3.  Patient will improve her timed up and go time to 12 seconds or less to reduce her fall risk.  Baseline:  Goal status:  INITIAL  LONG TERM GOALS: Target date: 08/18/23  Patient will be independent with her advanced HEP.  Baseline:  Goal status: INITIAL  2.  Patient will improve her five time sit to stand time to 15 seconds or  less to reduce her fall risk.  Baseline:  Goal status: INITIAL  3.  Patient will improve her gait speed to 1.0 m/s or greater for improved community mobility.  Baseline:  Goal status: INITIAL  4.  Patient will improve her bilateral hip flexor strength to at least 3+/5 for improved functional mobility.  Baseline:  Goal status: INITIAL  5.  Patient will be able to safely navigate at least 4 steps with a step to or reciprocal pattern for improved household mobility.  Baseline:  Goal status: INITIAL  ASSESSMENT:  CLINICAL IMPRESSION: Patient did very well today with therx and neuro re-edu including resisted walking.    OBJECTIVE IMPAIRMENTS: Abnormal gait, decreased activity tolerance, decreased balance, decreased endurance, decreased mobility, difficulty walking, and decreased strength.   ACTIVITY LIMITATIONS: stairs, transfers, and locomotion level  PARTICIPATION LIMITATIONS: meal prep, cleaning, laundry, shopping, and community activity  PERSONAL FACTORS: Age, Past/current experiences, Time since onset of injury/illness/exacerbation, and 3+ comorbidities: hypertension, essential tremor, osteopenia, allergies, macular degeneration, memory difficulty, anxiety, and depression  are also affecting patient's functional outcome.   REHAB POTENTIAL: Good  CLINICAL DECISION MAKING: Evolving/moderate complexity  EVALUATION COMPLEXITY: Moderate  PLAN:  PT FREQUENCY: 2x/week  PT DURATION: 6 weeks  PLANNED INTERVENTIONS: 97164- PT Re-evaluation, 97110-Therapeutic exercises, 97530- Therapeutic activity, 97112- Neuromuscular re-education, 97535- Self Care, 78469- Manual therapy, [phone]- Gait training, Patient/Family education, Balance training, and Stair training  PLAN FOR NEXT  SESSION: Nustep, lower extremity strengthening, gait training, and balance interventions   Beadie Matsunaga, [place], PT 07/21/2023, 3:08 PM

## 2023-07-22 ENCOUNTER — Other Ambulatory Visit: Payer: Self-pay | Admitting: *Deleted

## 2023-07-22 NOTE — Patient Outreach (Signed)
 Care Management   Visit Note  07/22/2023 Name: Rachel Walton MRN: [id number] DOB: 1929-08-05  Subjective: Rachel Walton is a 89 y.o. year old female who is a primary care patient of Tabori, Helane Rima, MD. The Care Management team was consulted for assistance.      Engaged with patient spoke with patient by telephone.    Goals Addressed             This Visit's Progress    RNCM Care Management Expected Outcomes: Monitor, Self-Manage and Reduce Sytmpoms of: HTN, HLD, Pain       Current Barriers:  Knowledge Deficits related to plan of care for management of HTN, HLD, and Pain  Chronic Disease Management support and education needs related to HTN, HLD, and Pain  Cognitive Deficits  RNCM Clinical Goal(s):  Patient will verbalize basic understanding of  HTN, HLD, and Pain disease process and self health management plan as evidenced by verbal explanation, recognizing symptoms and lifestyle modifications. take all medications exactly as prescribed and will call provider for medication related questions as evidenced by compliance with all medications attend all scheduled medical appointments: with primary care provider and specialist as evidenced by keeping all scheduled appointments demonstrate Improved and Ongoing adherence to prescribed treatment plan for HTN, HLD, and Pain as evidenced by consistent medication compliance, symptom monitoring and continued lifestyle modifications continue to work with RN Care Manager to address care management and care coordination needs related to  HTN, HLD, and Pain as evidenced by adherence to CM Team Scheduled appointments through collaboration with RN Care manager, provider, and care team.   Interventions: Evaluation of current treatment plan related to  self management and patient's adherence to plan as established by provider   Hyperlipidemia Interventions:  (Status:  Goal on track:  NO.) Long Term Goal Medication review performed; medication  list updated in electronic medical record.  Provider established cholesterol goals reviewed. Patient remains above goal and reports that she is compliant with her medication regime. She does endorse not following her diet as closely as she should. Education provided. Counseled on importance of regular laboratory monitoring as prescribed. Labs up to date Provided HLD educational materials Reviewed importance of limiting foods high in cholesterol. Patient reports that she has cut eating fried foods and mainly eats grilled or baked foods. She reports that they are eating at home mostly. Reviewed exercise goals and target of 150 minutes per week Screening for signs and symptoms of depression related to chronic disease state Assessed social determinant of health barriers  Lipid Panel  Lab Results  Component Value Date   CHOL 322 (H) 05/14/2023   HDL 96.80 05/14/2023   LDLCALC 181 (H) 05/14/2023   LDLDIRECT 150.0 11/06/2022   TRIG 221.0 (H) 05/14/2023   CHOLHDL 3 05/14/2023    Hypertension Interventions:  (Status:  Goal on track:  Yes.) Long Term Goal Last practice recorded BP readings:  BP Readings from Last 3 Encounters:  06/24/23 114/78  05/14/23 136/68  11/23/22 106/68   Most recent eGFR/CrCl:  Lab Results  Component Value Date   EGFR 53 (L) 04/21/2022    No components found for: "CRCL"  Evaluation of current treatment plan related to hypertension self management and patient's adherence to plan as established by provider. Patient has not been regularly checking her BP. RNCM reviewed goal for SBP and DBP. Denies any chest pain, headaches, dizziness or swelling Provided education to patient re: stroke prevention, s/s of heart attack and stroke. Education and  support Reviewed medications with patient and discussed importance of compliance. Reports compliance with all medications Counseled on adverse effects of illicit drug and excessive alcohol use in patients with high blood pressure.  Denies illicit drug and excessive alcohol use Discussed plans with patient for ongoing care management follow up and provided patient with direct contact information for care management team Advised patient, providing education and rationale, to monitor blood pressure daily and record, calling PCP for findings outside established parameters.  Reviewed scheduled/upcoming provider appointments including: 11-11-2023 with PCP Discussed complications of poorly controlled blood pressure such as heart disease, stroke, circulatory complications, vision complications, kidney impairment, sexual dysfunction  Pain Interventions:  (Status:  New goal. and Goal on track:  Yes.) Long Term Goal Pain assessment performed 2/10. Medications reviewed Reviewed provider established plan for pain management. Reports using tylenol arthritis and Voltaren gel Counseled on the importance of reporting any/all new or changed pain symptoms or management strategies to pain management provider Discussed use of relaxation techniques and/or diversional activities to assist with pain reduction (distraction, imagery, relaxation, massage, acupressure, TENS, heat, and cold application Reviewed with patient prescribed pharmacological and nonpharmacological pain relief strategies Advised patient to discuss any new changes with provider   Patient Goals/Self-Care Activities: Take all medications as prescribed Attend all scheduled provider appointments Call pharmacy for medication refills 3-7 days in advance of running out of medications Attend church or other social activities Perform all self care activities independently  Call provider office for new concerns or questions  check blood pressure daily write blood pressure results in a log or diary call doctor with any symptoms you believe are related to your medicine  Follow Up Plan:  Telephone follow up appointment with care management team member scheduled for:  08-23-2023 at 11:45  am           Consent to Services:  Patient was given information about care management services, agreed to services, and gave verbal consent to participate.   Plan: Telephone follow up appointment with care management team member scheduled for:08-23-2023 at 11:45 am  Larey Brick, BSN RN [facility], [facility] RN Care Manager Direct Dial: [phone]  Fax: [phone]

## 2023-07-22 NOTE — Patient Instructions (Signed)
 Visit Information  Thank you for taking time to visit with me today. Please don't hesitate to contact me if I can be of assistance to you before our next scheduled telephone appointment.  Following are the goals we discussed today:   Goals Addressed             This Visit's Progress    RNCM Care Management Expected Outcomes: Monitor, Self-Manage and Reduce Sytmpoms of: HTN, HLD, Pain       Current Barriers:  Knowledge Deficits related to plan of care for management of HTN, HLD, and Pain  Chronic Disease Management support and education needs related to HTN, HLD, and Pain  Cognitive Deficits  RNCM Clinical Goal(s):  Patient will verbalize basic understanding of  HTN, HLD, and Pain disease process and self health management plan as evidenced by verbal explanation, recognizing symptoms and lifestyle modifications. take all medications exactly as prescribed and will call provider for medication related questions as evidenced by compliance with all medications attend all scheduled medical appointments: with primary care provider and specialist as evidenced by keeping all scheduled appointments demonstrate Improved and Ongoing adherence to prescribed treatment plan for HTN, HLD, and Pain as evidenced by consistent medication compliance, symptom monitoring and continued lifestyle modifications continue to work with RN Care Manager to address care management and care coordination needs related to  HTN, HLD, and Pain as evidenced by adherence to CM Team Scheduled appointments through collaboration with RN Care manager, provider, and care team.   Interventions: Evaluation of current treatment plan related to  self management and patient's adherence to plan as established by provider   Hyperlipidemia Interventions:  (Status:  Goal on track:  NO.) Long Term Goal Medication review performed; medication list updated in electronic medical record.  Provider established cholesterol goals reviewed.  Patient remains above goal and reports that she is compliant with her medication regime. She does endorse not following her diet as closely as she should. Education provided. Counseled on importance of regular laboratory monitoring as prescribed. Labs up to date Provided HLD educational materials Reviewed importance of limiting foods high in cholesterol. Patient reports that she has cut eating fried foods and mainly eats grilled or baked foods. She reports that they are eating at home mostly. Reviewed exercise goals and target of 150 minutes per week Screening for signs and symptoms of depression related to chronic disease state Assessed social determinant of health barriers  Lipid Panel  Lab Results  Component Value Date   CHOL 322 (H) 05/14/2023   HDL 96.80 05/14/2023   LDLCALC 181 (H) 05/14/2023   LDLDIRECT 150.0 11/06/2022   TRIG 221.0 (H) 05/14/2023   CHOLHDL 3 05/14/2023    Hypertension Interventions:  (Status:  Goal on track:  Yes.) Long Term Goal Last practice recorded BP readings:  BP Readings from Last 3 Encounters:  06/24/23 114/78  05/14/23 136/68  11/23/22 106/68   Most recent eGFR/CrCl:  Lab Results  Component Value Date   EGFR 53 (L) 04/21/2022    No components found for: "CRCL"  Evaluation of current treatment plan related to hypertension self management and patient's adherence to plan as established by provider. Patient has not been regularly checking her BP. RNCM reviewed goal for SBP and DBP. Denies any chest pain, headaches, dizziness or swelling Provided education to patient re: stroke prevention, s/s of heart attack and stroke. Education and support Reviewed medications with patient and discussed importance of compliance. Reports compliance with all medications Counseled on adverse effects of  illicit drug and excessive alcohol use in patients with high blood pressure. Denies illicit drug and excessive alcohol use Discussed plans with patient for ongoing care  management follow up and provided patient with direct contact information for care management team Advised patient, providing education and rationale, to monitor blood pressure daily and record, calling PCP for findings outside established parameters.  Reviewed scheduled/upcoming provider appointments including: 11-11-2023 with PCP Discussed complications of poorly controlled blood pressure such as heart disease, stroke, circulatory complications, vision complications, kidney impairment, sexual dysfunction  Pain Interventions:  (Status:  New goal. and Goal on track:  Yes.) Long Term Goal Pain assessment performed 2/10. Medications reviewed Reviewed provider established plan for pain management. Reports using tylenol arthritis and Voltaren gel Counseled on the importance of reporting any/all new or changed pain symptoms or management strategies to pain management provider Discussed use of relaxation techniques and/or diversional activities to assist with pain reduction (distraction, imagery, relaxation, massage, acupressure, TENS, heat, and cold application Reviewed with patient prescribed pharmacological and nonpharmacological pain relief strategies Advised patient to discuss any new changes with provider   Patient Goals/Self-Care Activities: Take all medications as prescribed Attend all scheduled provider appointments Call pharmacy for medication refills 3-7 days in advance of running out of medications Attend church or other social activities Perform all self care activities independently  Call provider office for new concerns or questions  check blood pressure daily write blood pressure results in a log or diary call doctor with any symptoms you believe are related to your medicine  Follow Up Plan:  Telephone follow up appointment with care management team member scheduled for:  08-23-2023 at 11:45 am          Our next appointment is by telephone on 08-23-2023 at 11:45 am  Please  call the care guide team at [phone] if you need to cancel or reschedule your appointment.   If you are experiencing a Mental Health or Behavioral Health Crisis or need someone to talk to, please call the Suicide and Crisis Lifeline: 988 call the [place] National Suicide Prevention Lifeline: 917-539-1625 or TTY: (204) 421-2045 TTY 346 259 2187) to talk to a trained counselor call 1-800-273-TALK (toll free, 24 hour hotline)   The patient verbalized understanding of instructions, educational materials, and care plan provided today and DECLINED offer to receive copy of patient instructions, educational materials, and care plan.   Telephone follow up appointment with care management team member scheduled for:08-23-2023 at 11:45 am  Larey Brick, BSN RN [facility], [facility] RN Care Manager Direct Dial: [phone]  Fax: [phone]

## 2023-07-23 ENCOUNTER — Encounter

## 2023-07-27 ENCOUNTER — Encounter: Admitting: *Deleted

## 2023-07-29 ENCOUNTER — Encounter: Admitting: *Deleted

## 2023-08-02 ENCOUNTER — Other Ambulatory Visit: Payer: Self-pay | Admitting: Family Medicine

## 2023-08-02 DIAGNOSIS — E876 Hypokalemia: Secondary | ICD-10-CM

## 2023-08-02 DIAGNOSIS — I10 Essential (primary) hypertension: Secondary | ICD-10-CM

## 2023-08-13 ENCOUNTER — Other Ambulatory Visit: Payer: Self-pay | Admitting: Family Medicine

## 2023-08-13 DIAGNOSIS — F419 Anxiety disorder, unspecified: Secondary | ICD-10-CM

## 2023-08-23 ENCOUNTER — Encounter: Payer: Self-pay | Admitting: *Deleted

## 2023-08-23 ENCOUNTER — Other Ambulatory Visit: Payer: Self-pay

## 2023-08-23 ENCOUNTER — Other Ambulatory Visit: Payer: Self-pay | Admitting: *Deleted

## 2023-08-23 NOTE — Patient Outreach (Signed)
 Complex Care Management   Visit Note  08/23/2023  Name:  Rachel Walton MRN: [id number] DOB: 1930-04-03  Situation: Referral received for Complex Care Management related to  HTN, HLD  I obtained verbal consent from Patient.  Visit completed with patient  on the phone  Background:   Past Medical History:  Diagnosis Date   Acute gastric ulcer    Allergy    Anxiety    Atrophic vaginitis    Barrett's esophagus    Basal cell carcinoma of nose     under nose   Cataract    Depression    Diverticulosis    Esophageal stricture    Esophageal ulcer    Fatty liver    Gall stones 06/15/2013   GERD (gastroesophageal reflux disease)    Heart murmur    Hiatal hernia    Hx of adenomatous colonic polyps    Hyperlipidemia    Hypertension    Hypokalemia    IBS (irritable bowel syndrome)    Kyphosis    Lymphocytic colitis    Macular degeneration    "?wet or dry; ? 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    Tremor    Vitamin D deficiency     Assessment: Patient Reported Symptoms:  Cognitive Cognitive Status: Alert and oriented to person, place, and time, Struggling with memory recall Cognitive/Intellectual Conditions Management [RPT]: None reported or documented in medical history or problem list   Health Maintenance Behaviors: Annual physical exam, Sleep adequate Healing Pattern: Average Health Facilitated by: Rest  Neurological   Neurological Management Strategies: Medication therapy Neurological Self-Management Outcome: 3 (uncertain)  HEENT HEENT Symptoms Reported: No symptoms reported HEENT Self-Management Outcome: 4 (good)    Cardiovascular Cardiovascular Symptoms Reported: No symptoms reported Does patient have uncontrolled Hypertension?: No Cardiovascular Conditions: Hypertension, High blood cholesterol Cardiovascular Management  Strategies: Medication therapy Cardiovascular Self-Management Outcome: 3 (uncertain)  Respiratory Respiratory Symptoms Reported: No symptoms reported Respiratory Self-Management Outcome: 4 (good)  Endocrine Patient reports the following symptoms related to hypoglycemia or hyperglycemia : No symptoms reported Is patient diabetic?: No Endocrine Self-Management Outcome: 4 (good)  Gastrointestinal Gastrointestinal Symptoms Reported: No symptoms reported Gastrointestinal Self-Management Outcome: 4 (good) Nutrition Risk Screen (CP): No indicators present  Genitourinary   Genitourinary Self-Management Outcome: 4 (good)  Integumentary Integumentary Symptoms Reported: No symptoms reported Skin Management Strategies: Routine screening Skin Self-Management Outcome: 4 (good)  Musculoskeletal Musculoskelatal Symptoms Reviewed: Weakness Musculoskeletal Management Strategies: Medication therapy Musculoskeletal Self-Management Outcome: 4 (good) Falls in the past year?: No Number of falls in past year: 1 or less Was there an injury with Fall?: No Fall Risk Category Calculator: 0 Patient Fall Risk Level: Low Fall Risk Patient at Risk for Falls Due to: History of fall(s) Fall risk Follow up: Falls evaluation completed, Education provided, Falls prevention discussed  Psychosocial Psychosocial Symptoms Reported: Anxiety - if selected complete GAD Behavioral Health Conditions: Anxiety Behavioral Management Strategies: Medication therapy Behavioral Health Self-Management Outcome: 3 (uncertain) Major Change/Loss/Stressor/Fears (CP): Denies Techniques to Cope with Loss/Stress/Change: Spiritual practice(s) Quality of Family Relationships: non-existent Do you feel physically threatened by others?: No      08/23/2023   11:43 AM  Depression screen PHQ 2/9  Decreased Interest 0  Down, Depressed, Hopeless 1  PHQ - 2 Score 1    Vitals:  08/23/23 1118  BP: (!) 146/61    Medications Reviewed Today      Reviewed by Ricky Stabs, RN (Registered Nurse) on 08/23/23 at 1136  Med List Status: <None>   Medication Order Taking? Sig Documenting Provider Last Dose Status Informant  acetaminophen (TYLENOL) 500 MG tablet [id number] Yes Take 500 mg by mouth every 6 (six) hours as needed for mild pain. [provider] Taking Active Self  azelastine (ASTELIN) 0.1 % nasal spray [id number] No Place 1 spray into both nostrils 2 (two) times daily. Use in each nostril as directed  Patient not taking: Reported on 08/23/2023   Sheliah Hatch, MD Not Taking Consider Medication Status and Discontinue   CALCIUM PO [id number] No Take by mouth daily.  Patient not taking: Reported on 08/23/2023   [provider] Not Taking Consider Medication Status and Discontinue   Cholecalciferol (VITAMIN D) 50 MCG (2000 UT) CAPS [id number] No Take 2,000 Units by mouth daily.  Patient not taking: Reported on 07/22/2023   [provider] Not Taking Consider Medication Status and Discontinue Self  clonazePAM (KLONOPIN) 0.5 MG tablet [id number] Yes TAKE 1/2 TO 1 TABLET BY MOUTH DAILY AS NEEDED Sheliah Hatch, MD Taking Active   famotidine (PEPCID) 20 MG tablet [id number] Yes Take 20 mg by mouth 2 (two) times daily. Sheliah Hatch, MD Taking Active Self  fluticasone Aleda Grana) 50 MCG/ACT nasal spray [id number] Yes Place 2 sprays into both nostrils daily. Daphine Deutscher, Mary-Margaret, FNP Taking Active   lisinopril (ZESTRIL) 20 MG tablet [id number] Yes TAKE ONE (1) TABLET BY MOUTH EVERY DAY Sheliah Hatch, MD Taking Active   loratadine (CLARITIN) 10 MG tablet [id number] Yes Take 1 tablet (10 mg total) by mouth daily. Sheliah Hatch, MD Taking Active   meloxicam [facility]) 7.5 MG tablet [id number] No Take 7.5 mg by mouth daily.  Patient not taking: Reported on 08/23/2023   [provider] Not Taking Consider Medication Status and Discontinue   mesalamine (LIALDA) 1.2 g EC tablet [id number] Yes  Take 1 tablet (1.2 g total) by mouth 2 (two) times daily. Sheliah Hatch, MD Taking Active   Multiple Vitamins-Minerals (PRESERVISION AREDS PO) [id number] Yes Take by mouth. [provider] Taking Active   naphazoline-pheniramine (NAPHCON-A) 0.025-0.3 % ophthalmic solution [id number] No Place 1 drop into both eyes as needed.  Patient not taking: Reported on 08/23/2023   [provider] Not Taking Active   Omega-3 Fatty Acids (FISH OIL) 1200 MG CPDR [id number] No Take by mouth.  Patient not taking: Reported on 08/23/2023   [provider] Not Taking Active   pantoprazole (PROTONIX) 40 MG tablet [id number] Yes Take 1 tablet (40 mg total) by mouth daily. Sheliah Hatch, MD Taking Active   potassium chloride (KLOR-CON) 10 MEQ tablet [id number] Yes TAKE ONE (1) TABLET BY MOUTH EVERY DAY Sheliah Hatch, MD Taking Active   rosuvastatin (CRESTOR) 10 MG tablet [id number] Yes Take 1 tablet (10 mg total) by mouth daily. Sheliah Hatch, MD Taking Active             Recommendation:   PCP Follow-up  Follow Up Plan:   Telephone follow up appointment date/time:  09-22-2023 at 9:45 am  Danise Edge, BSN RN [facility], [facility] RN Care Manager Direct Dial: [phone]  Fax: [phone]

## 2023-08-23 NOTE — Patient Instructions (Signed)
 Visit Information  Thank you for taking time to visit with me today. Please don't hesitate to contact me if I can be of assistance to you before our next scheduled appointment.  Your next care management appointment is by telephone on 09-22-2023 at 9:45 am  Telephone follow-up in 1 day   Please call the care guide team at [phone] if you need to cancel, schedule, or reschedule an appointment.   Please call the Suicide and Crisis Lifeline: 988 call the [place]  National Suicide Prevention Lifeline: 6065110648 or TTY: 581-803-5381 TTY 682 750 0674) to talk to a trained counselor call 1-800-273-TALK (toll free, 24 hour hotline) call the [facility]: [phone] call 911 if you are experiencing a Mental Health or Behavioral Health Crisis or need someone to talk to.  Grandville Lax, BSN RN [facility] RN Care Manager Direct Dial: [phone]  Fax: [phone]

## 2023-08-30 ENCOUNTER — Other Ambulatory Visit: Payer: Self-pay | Admitting: Family Medicine

## 2023-09-01 ENCOUNTER — Ambulatory Visit: Attending: Family Medicine | Admitting: Physical Therapy

## 2023-09-01 DIAGNOSIS — M6281 Muscle weakness (generalized): Secondary | ICD-10-CM | POA: Insufficient documentation

## 2023-09-01 NOTE — Therapy (Signed)
 Patient states she is not wanting to continue PT at this time.  She states she only wants to do there stepper.  She was provided with information regarding a local gym that has this equipment.    Rachel Walton MPT

## 2023-09-21 ENCOUNTER — Ambulatory Visit: Payer: Medicare PPO | Admitting: Family Medicine

## 2023-09-21 ENCOUNTER — Encounter: Payer: Self-pay | Admitting: Family Medicine

## 2023-09-21 VITALS — BP 110/68 | HR 79 | Temp 97.9°F | Ht 61.0 in | Wt 125.5 lb

## 2023-09-21 DIAGNOSIS — H938X3 Other specified disorders of ear, bilateral: Secondary | ICD-10-CM | POA: Insufficient documentation

## 2023-09-21 DIAGNOSIS — R490 Dysphonia: Secondary | ICD-10-CM | POA: Diagnosis not present

## 2023-09-21 DIAGNOSIS — I10 Essential (primary) hypertension: Secondary | ICD-10-CM | POA: Diagnosis not present

## 2023-09-21 DIAGNOSIS — E782 Mixed hyperlipidemia: Secondary | ICD-10-CM

## 2023-09-21 LAB — CBC WITH DIFFERENTIAL/PLATELET
Basophils Absolute: 0.1 10*3/uL (ref 0.0–0.1)
Basophils Relative: 1 % (ref 0.0–3.0)
Eosinophils Absolute: 0.2 10*3/uL (ref 0.0–0.7)
Eosinophils Relative: 3.4 % (ref 0.0–5.0)
HCT: 36.8 % (ref 36.0–46.0)
Hemoglobin: 12.2 g/dL (ref 12.0–15.0)
Lymphocytes Relative: 42.1 % (ref 12.0–46.0)
Lymphs Abs: 2.3 10*3/uL (ref 0.7–4.0)
MCHC: 33 g/dL (ref 30.0–36.0)
MCV: 91 fl (ref 78.0–100.0)
Monocytes Absolute: 0.5 10*3/uL (ref 0.1–1.0)
Monocytes Relative: 8.7 % (ref 3.0–12.0)
Neutro Abs: 2.4 10*3/uL (ref 1.4–7.7)
Neutrophils Relative %: 44.8 % (ref 43.0–77.0)
Platelets: 247 10*3/uL (ref 150.0–400.0)
RBC: 4.04 Mil/uL (ref 3.87–5.11)
RDW: 13.7 % (ref 11.5–15.5)
WBC: 5.4 10*3/uL (ref 4.0–10.5)

## 2023-09-21 LAB — BASIC METABOLIC PANEL WITH GFR
BUN: 8 mg/dL (ref 6–23)
CO2: 28 meq/L (ref 19–32)
Calcium: 9.1 mg/dL (ref 8.4–10.5)
Chloride: 102 meq/L (ref 96–112)
Creatinine, Ser: 1.01 mg/dL (ref 0.40–1.20)
GFR: 48.59 mL/min — ABNORMAL LOW (ref >60.00)
Glucose, Bld: 86 mg/dL (ref 70–99)
Potassium: 4.4 meq/L (ref 3.5–5.1)
Sodium: 139 meq/L (ref 135–145)

## 2023-09-21 LAB — TSH: TSH: 1.56 u[IU]/mL (ref 0.35–5.50)

## 2023-09-21 LAB — HEPATIC FUNCTION PANEL
ALT: 17 U/L (ref 0–35)
AST: 24 U/L (ref 0–37)
Albumin: 4.1 g/dL (ref 3.5–5.2)
Alkaline Phosphatase: 56 U/L (ref 39–117)
Bilirubin, Direct: 0.1 mg/dL (ref 0.0–0.3)
Total Bilirubin: 0.5 mg/dL (ref 0.2–1.2)
Total Protein: 6.7 g/dL (ref 6.0–8.3)

## 2023-09-21 LAB — LIPID PANEL
Cholesterol: 231 mg/dL — ABNORMAL HIGH (ref 0–200)
HDL: 115.2 mg/dL (ref >39.00)
LDL Cholesterol: 83 mg/dL (ref 0–99)
NonHDL: 115.42
Total CHOL/HDL Ratio: 2
Triglycerides: 161 mg/dL — ABNORMAL HIGH (ref 0.0–149.0)
VLDL: 32.2 mg/dL (ref 0.0–40.0)

## 2023-09-21 NOTE — Assessment & Plan Note (Signed)
 Chronic problem.  On Crestor 10mg  daily w/o difficulty.  Check labs.  Adjust meds prn

## 2023-09-21 NOTE — Assessment & Plan Note (Signed)
 Pt reports this is an ongoing issue.  On daily antihistamine and using Flonase  w/o relief.  R TM is retracted and L TM is obscured by wax- but given her head tremor, I don't feel safe removing w/ instrumentation.  Will refer to ENT.  Pt expressed understanding and is in agreement w/ plan.

## 2023-09-21 NOTE — Patient Instructions (Signed)
 Schedule your complete physical in January We'll notify you of your lab results and make any changes if needed We'll call you to schedule your ENT appt for the hoarseness and ear fullness Drink plenty of fluids Call with any questions or concerns Stay Safe!  Stay Healthy! Have a [place]!!!

## 2023-09-21 NOTE — Assessment & Plan Note (Signed)
 Ongoing issue for pt.  Suspect PND is contributing but she is already on daily antihistamine and nasal steroid.  Again, refer to ENT for evaluation and tx.  Pt expressed understanding and is in agreement w/ plan.

## 2023-09-21 NOTE — Assessment & Plan Note (Signed)
 Chronic problem.  Well controlled today on Lisinopril .  Currently asymptomatic.  Check labs due to ACE use but no anticipated med changes.  Will follow.

## 2023-09-21 NOTE — Progress Notes (Signed)
   Subjective:    Patient ID: Rachel Walton, female    DOB: 08-23-1929, 89 y.o.   MRN: [id number]  HPI HTN- chronic problem, on Lisinopril  20mg  daily.  Denies CP, SOB, HA's, visual changes,  Hyperlipidemia- chronic problem, on Crestor  10mg  daily.  No abd pain, N/V.  Hoarseness- ongoing issue.  Pt reports this tends to happen w/ seasonal allergies but is worse this year.   Taking Claritin  daily and using Flonase .  Pt feels she has had current sxs for ~3 months.  Also having bilateral ear fullness.  Has previously seen Dr Melissa Spring (retired)  Review of Systems For ROS see HPI     Objective:   Physical Exam Vitals reviewed.  Constitutional:      General: She is not in acute distress.    Appearance: Normal appearance. She is well-developed. She is not ill-appearing.  HENT:     Head: Normocephalic and atraumatic.     Right Ear: Tympanic membrane is retracted.     Left Ear: There is impacted cerumen.  Eyes:     Conjunctiva/sclera: Conjunctivae normal.     Pupils: Pupils are equal, round, and reactive to light.  Neck:     Thyroid : No thyromegaly.  Cardiovascular:     Rate and Rhythm: Normal rate and regular rhythm.     Pulses: Normal pulses.     Heart sounds: Normal heart sounds. No murmur heard. Pulmonary:     Effort: Pulmonary effort is normal. No respiratory distress.     Breath sounds: Normal breath sounds.  Abdominal:     General: There is no distension.     Palpations: Abdomen is soft.     Tenderness: There is no abdominal tenderness.  Musculoskeletal:     Cervical back: Normal range of motion and neck supple.     Right lower leg: No edema.     Left lower leg: No edema.  Lymphadenopathy:     Cervical: No cervical adenopathy.  Skin:    General: Skin is warm and dry.  Neurological:     General: No focal deficit present.     Mental Status: She is alert and oriented to person, place, and time.  Psychiatric:        Mood and Affect: Mood normal.        Behavior: Behavior  normal.           Assessment & Plan:

## 2023-09-22 ENCOUNTER — Ambulatory Visit: Payer: Self-pay | Admitting: Family Medicine

## 2023-09-22 ENCOUNTER — Other Ambulatory Visit: Payer: Self-pay

## 2023-09-22 NOTE — Telephone Encounter (Signed)
 Pt has been notified.

## 2023-09-22 NOTE — Telephone Encounter (Signed)
-----   Message from Laymon Priest sent at 09/22/2023  7:34 AM EDT ----- Cholesterol looks MUCH better!  Remainder of labs are stable and look good.  No changes at this time

## 2023-09-22 NOTE — Patient Instructions (Signed)
 Visit Information  Thank you for taking time to visit with me today. Please don't hesitate to contact me if I can be of assistance to you before our next scheduled telephone appointment.  Our next appointment is by telephone on 10/27/23 at 9:15 am  Following is a copy of your care plan:   Goals Addressed             This Visit's Progress    VBCI RN Care Plan: HLD       Problems:  Chronic Disease Management support and education needs related to HLD Cognitive Deficits  Goal: Over the next 3 months the Patient will attend all scheduled medical appointments: with primary care provider and specialist as evidenced by keeping all scheduled appointments        demonstrate Improved and Ongoing adherence to prescribed treatment plan for HLD as evidenced by consistent medication compliance, symptom monitoring, lifestyle modifications take all medications exactly as prescribed and will call provider for medication related questions as evidenced by compliance with all medications     Interventions:   Hyperlipidemia Interventions: Medication review performed; medication list updated in electronic medical record.  Provider established cholesterol goals reviewed. Remains above goal, reports compliance with diet and medication Counseled on importance of regular laboratory monitoring as prescribed. Labs up to date. Provided HLD educational materials Reviewed role and benefits of statin for ASCVD risk reduction. Reports compliance with Crestor  Discussed strategies to manage statin-induced myalgias. Denies myalgias Reviewed importance of limiting foods high in cholesterol. Reports compliance. Reviewed exercise goals and target of 150 minutes per week Screening for signs and symptoms of depression related to chronic disease state Assessed social determinant of health barriers  Lab Results  Component Value Date   CHOL 322 (H) 05/14/2023   HDL 96.80 05/14/2023   LDLCALC 181 (H) 05/14/2023    LDLDIRECT 150.0 11/06/2022   TRIG 221.0 (H) 05/14/2023   CHOLHDL 3 05/14/2023    Patient Self-Care Activities:  Attend all scheduled provider appointments Call provider office for new concerns or questions  Take medications as prescribed   take all medications exactly as prescribed call doctor when you experience any new symptoms develop an exercise routine  Plan:  Telephone follow up appointment with care management team member scheduled for:  10-27-2023 at 9:15 am          VBCI RN Care Plan: HTN       Problems:  Chronic Disease Management support and education needs related to HTN Cognitive Deficits  Goal: Over the next 3 months the Patient will attend all scheduled medical appointments: with primary care provider and specialist as evidenced by keeping all scheduled appointments        take all medications exactly as prescribed and will call provider for medication related questions as evidenced by compliance with all medications    verbalize basic understanding of HTN disease process and self health management plan as evidenced by verbal explanation, recognizing symptoms, lifestyle modifications  Interventions:   Hypertension Interventions: Last practice recorded BP readings:  BP Readings from Last 3 Encounters:  08/23/23 (!) 146/61  06/24/23 114/78  05/14/23 136/68   Most recent eGFR/CrCl:  Lab Results  Component Value Date   EGFR 53 (L) 04/21/2022    No components found for: "CRCL"  Evaluation of current treatment plan related to hypertension self management and patient's adherence to plan as established by provider Provided education to patient re: stroke prevention, s/s of heart attack and stroke. Education and support provided. Reviewed  medications with patient and discussed importance of compliance. Reports compliance with all medications Counseled on adverse effects of illicit drug and excessive alcohol use in patients with high blood pressure . Denies illicit  drug or excessive alcohol use Counseled on the importance of exercise goals with target of 150 minutes per week Discussed plans with patient for ongoing care management follow up and provided patient with direct contact information for care management team Advised patient, providing education and rationale, to monitor blood pressure daily and record, calling PCP for findings outside established parameters. RNCM reviewed with patient goal SBP<140 DBP<90 Reviewed scheduled/upcoming provider appointments including: 09-21-2023 with PCP Advised patient to discuss elevated home readings with provider Provided education on prescribed diet heart healthy Discussed complications of poorly controlled blood pressure such as heart disease, stroke, circulatory complications, vision complications, kidney impairment, sexual dysfunction Screening for signs and symptoms of depression related to chronic disease state  Assessed social determinant of health barriers  Patient Self-Care Activities:  Attend all scheduled provider appointments Call provider office for new concerns or questions  Perform all self care activities independently  Take medications as prescribed   check blood pressure daily take blood pressure log to all doctor appointments call doctor for signs and symptoms of high blood pressure take medications for blood pressure exactly as prescribed  Plan:  Telephone follow up appointment with care management team member scheduled for:  10-27-2023 at 9:15 am             Patient verbalizes understanding of instructions and care plan provided today and agrees to view in MyChart. Active MyChart status and patient understanding of how to access instructions and care plan via MyChart confirmed with patient.     The patient has been provided with contact information for the care management team and has been advised to call with any health related questions or concerns.   Please call the care guide  team at [phone] if you need to cancel or reschedule your appointment.   Please call 1-800-273-TALK (toll free, 24 hour hotline) if you are experiencing a Mental Health or Behavioral Health Crisis or need someone to talk to.  Zayvian Mcmurtry A. Saverio Curling RN, BA, [facility], CRRN [place]  [facility] Population Health RN Care Manager Direct Dial: [phone]  Fax: [phone]

## 2023-09-22 NOTE — Patient Outreach (Signed)
 Complex Care Management   Visit Note  09/22/2023  Name:  Rachel Walton MRN: [id number] DOB: 1929-06-19  Situation: Referral received for Complex Care Management related to Hypertension and Hyperlipidemia I obtained verbal consent from Patient.  Visit completed with Patient and RNCM  on the phone  Background:   Past Medical History:  Diagnosis Date   Acute gastric ulcer    Allergy    Anxiety    Atrophic vaginitis    Barrett's esophagus    Basal cell carcinoma of nose     under nose   Cataract    Depression    Diverticulosis    Esophageal stricture    Esophageal ulcer    Fatty liver    Gall stones 06/15/2013   GERD (gastroesophageal reflux disease)    Heart murmur    Hiatal hernia    Hx of adenomatous colonic polyps    Hyperlipidemia    Hypertension    Hypokalemia    IBS (irritable bowel syndrome)    Kyphosis    Lymphocytic colitis    Macular degeneration    "?wet or dry; ? 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    Tremor    Vitamin D  deficiency     Assessment: Patient Reported Symptoms:  Cognitive Cognitive Status: Alert and oriented to person, place, and time   Health Maintenance Behaviors: Annual physical exam, Exercise, Healthy diet Health Facilitated by: Healthy diet, Rest  Neurological Neurological Review of Symptoms: Other: [place]her Neurological Symptoms/Conditions [RPT]: Essential tremor    HEENT HEENT Symptoms Reported: Change or loss of hearing HEENT Conditions: Ear problem(s), Vision problem(s) Vision Problems: blindness/vision loss (suffers from Age-related Macular Degeneration, dry, both eyes: Amblyopia ex anopsia of Right eye: also wears corrective lenses) HEENT Management Strategies: Medication therapy, Routine screening, Medical device HEENT Self-Management Outcome: 3 (uncertain) HEENT Comment: Suffers  from Age-related Macular Degeneration, dry, both eyes: Amblyopia ex anopsia of Right eye: also wears corrective lenses. Also complains of bilateral Ear Fullness Ear problem(s), Vision problem(s)  Cardiovascular Cardiovascular Symptoms Reported: No symptoms reported Does patient have uncontrolled Hypertension?: No (BP in PCP's office on 5/13 = 110/68) Cardiovascular Conditions: Hypertension Cardiovascular Management Strategies: Medication therapy, Coping strategies, Adequate rest, Diet modification, Exercise Cardiovascular Self-Management Outcome: 4 (good)  Respiratory Respiratory Symptoms Reported: No symptoms reported    Endocrine Patient reports the following symptoms related to hypoglycemia or hyperglycemia : No symptoms reported Is patient diabetic?: No Endocrine Conditions: Vitamin D  deficiency Endocrine Management Strategies: Medication therapy, Diet modification, Exercise, Routine screening, Adequate rest Endocrine Self-Management Outcome: 4 (good) Endocrine Comment: Takes multivitamins and minerals supplement  Gastrointestinal Gastrointestinal Symptoms Reported: No symptoms reported Gastrointestinal Conditions: Other Other Gastrointestinal Conditions: Barrett's Esophagus Gastrointestinal Management Strategies: Medication therapy, Coping strategies, Diet modification Gastrointestinal Self-Management Outcome: 4 (good) Gastrointestinal Comment: on pantoprazole  Nutrition Risk Screen (CP): No indicators present  Genitourinary Genitourinary Symptoms Reported: No symptoms reported    Integumentary Integumentary Symptoms Reported: No symptoms reported    Musculoskeletal Musculoskelatal Symptoms Reviewed: Other Additional Musculoskeletal Details: Suffers from Kyphosis and Osteopenia with a high risk for fractures Musculoskeletal Conditions: Osteopenia, Other Other Musculoskeletal Conditions: Kyphosis Musculoskeletal Management Strategies: Exercise, Coping strategies, Routine screening,  Adequate rest Musculoskeletal Self-Management Outcome: 4 (good) Falls in the past year?: No Number of falls in past year: 1 or less Was  there an injury with Fall?: No Fall Risk Category Calculator: 0 Patient Fall Risk Level: Low Fall Risk Patient at Risk for Falls Due to: Other (Comment) (Osteopenia) Fall risk Follow up: Falls prevention discussed, Education provided, Falls evaluation completed  Psychosocial Psychosocial Symptoms Reported: No symptoms reported Behavioral Health Conditions: Anxiety Behavioral Management Strategies: Medication therapy, Coping strategies, Adequate rest, Exercise, Support system Behavioral Health Self-Management Outcome: 4 (good) Major Change/Loss/Stressor/Fears (CP): Medical condition, self, Medical condition, family Quality of Family Relationships: involved, helpful, supportive Do you feel physically threatened by others?: No      08/23/2023   11:43 AM  Depression screen PHQ 2/9  Decreased Interest 0  Down, Depressed, Hopeless 1  PHQ - 2 Score 1    There were no vitals filed for this visit.  Medications Reviewed Today     Reviewed by Randye Buttner, RN (Registered Nurse) on 09/22/23 at 1014  Med List Status: <None>   Medication Order Taking? Sig Documenting Provider Last Dose Status Informant  acetaminophen  (TYLENOL ) 500 MG tablet [id number] Yes Take 500 mg by mouth every 6 (six) hours as needed for mild pain. [provider] Taking Active Self  azelastine  (ASTELIN ) 0.1 % nasal spray [id number] Yes Place 1 spray into both nostrils 2 (two) times daily. Use in each nostril as directed Jess Morita, MD Taking Active   clonazePAM  (KLONOPIN ) 0.5 MG tablet [id number] Yes TAKE 1/2 TO 1 TABLET BY MOUTH DAILY AS NEEDED Tabori, Katherine E, MD Taking Active   famotidine  (PEPCID ) 20 MG tablet [id number] Yes Take 20 mg by mouth 2 (two) times daily. Tabori, Katherine E, MD Taking Active Self  fluticasone  (FLONASE ) 50 MCG/ACT nasal spray [id number]  Yes Place 2 sprays into both nostrils daily. Gaylyn Keas, Mary-Margaret, FNP Taking Active   lisinopril  (ZESTRIL ) 20 MG tablet [id number] Yes TAKE ONE (1) TABLET BY MOUTH EVERY DAY Jess Morita, MD Taking Active   loratadine  (CLARITIN ) 10 MG tablet [id number] Yes Take 1 tablet (10 mg total) by mouth daily. Tabori, Katherine E, MD Taking Active   meloxicam [facility]) 7.5 MG tablet [id number] Yes Take 7.5 mg by mouth daily. [provider] Taking Active   mesalamine  (LIALDA ) 1.2 g EC tablet [id number] Yes Take 1 tablet (1.2 g total) by mouth 2 (two) times daily. Tabori, Katherine E, MD Taking Active   Multiple Vitamins-Minerals (PRESERVISION AREDS PO) [id number] Yes Take by mouth. [provider] Taking Active   naphazoline-pheniramine (NAPHCON-A) 0.025-0.3 % ophthalmic solution [id number] Yes Place 1 drop into both eyes as needed. [provider] Taking Active   Omega-3 Fatty Acids  (FISH OIL) 1200 MG CPDR [id number] Yes Take by mouth. [provider] Taking Active   pantoprazole  (PROTONIX ) 40 MG tablet [id number] Yes Take 1 tablet (40 mg total) by mouth daily. Tabori, Katherine E, MD Taking Active   potassium chloride  (KLOR-CON ) 10 MEQ tablet [id number] Yes TAKE ONE (1) TABLET BY MOUTH EVERY DAY Jess Morita, MD Taking Active   rosuvastatin  (CRESTOR ) 10 MG tablet [id number] Yes TAKE ONE (1) TABLET BY MOUTH EVERY DAY Tabori, Katherine E, MD Taking Active             Recommendation:   PCP Follow up with Dr Paulla Bossier on 11/11/23 @ 1pm  Follow Up Plan:   10/27/23 at 9:15 am with RN Case manager Barbra Ley A. Saverio Curling RN, BA, [facility], CRRN [place]  [facility] Population Health RN Care Manager Direct Dial: [phone]  Fax: [phone]

## 2023-10-14 ENCOUNTER — Other Ambulatory Visit: Payer: Self-pay | Admitting: Family Medicine

## 2023-10-15 ENCOUNTER — Ambulatory Visit: Payer: Self-pay

## 2023-10-15 ENCOUNTER — Ambulatory Visit (HOSPITAL_COMMUNITY)
Admission: RE | Admit: 2023-10-15 | Discharge: 2023-10-15 | Disposition: A | Discharge status: Home / Self Care | Source: Ambulatory Visit | Attending: Student in an Organized Health Care Education/Training Program | Admitting: Student in an Organized Health Care Education/Training Program

## 2023-10-15 ENCOUNTER — Ambulatory Visit: Admitting: Student in an Organized Health Care Education/Training Program

## 2023-10-15 ENCOUNTER — Encounter: Payer: Self-pay | Admitting: Student in an Organized Health Care Education/Training Program

## 2023-10-15 VITALS — BP 166/75 | HR 76 | Wt 125.0 lb

## 2023-10-15 DIAGNOSIS — M1612 Unilateral primary osteoarthritis, left hip: Secondary | ICD-10-CM | POA: Insufficient documentation

## 2023-10-15 DIAGNOSIS — M25552 Pain in left hip: Secondary | ICD-10-CM | POA: Diagnosis not present

## 2023-10-15 MED ORDER — MELOXICAM 7.5 MG PO TABS: 7.5000 mg | ORAL_TABLET | Freq: Every day | ORAL | 0 refills | Status: DC | PRN

## 2023-10-15 NOTE — Telephone Encounter (Signed)
 Patient is going to be seen for pain in the groin and fever

## 2023-10-15 NOTE — Assessment & Plan Note (Signed)
 New issue.  2 weeks of pain in her left groin, based on history and exam I think this is most likely left hip osteoarthritis.  Not sure why its flared up over the last 2 weeks.  No systemic symptoms like fever, walking okay, low risk for septic arthritis.  Will get x-ray of the left hip today.  We talked about supportive care including anti-inflammatories.  I recommended meloxicam 7.5 mg daily for 5 days.  Based on the x-ray I think we can better estimate how long this is going to bother her.  She is frail, advanced age, frequent falls.  I recommended the use of a walker to help her with mobility.

## 2023-10-15 NOTE — Progress Notes (Signed)
   Acute Office Visit  Subjective:     Patient ID: Rachel Walton, female    DOB: 03/27/1930, 89 y.o.   MRN: [id number]  Chief Complaint  Patient presents with   Groin Pain    Pain in the groin area but the pain is worse on the left side for about a week. Patient has been putting on lidocaine  plus but that was no longer helping so patient started using diclofenac sodium topical gel     HPI  89 year old person who is overall pretty healthy, here for 2 weeks of worsening pain in her left groin.  Does not remember what she was doing before this started.  She says she is very active living at home.  She walks a lot.  Pain is located deep in the left groin.  She was worried about a hernia.  Denies any history of arthritis, no history of orthopedic surgery.  She has had falls in the past, but denies any recent falls over the last few weeks.  No fevers or chills.  She is eating and drinking okay.  Doing okay at home.  No other recent changes in her medications.      Objective:    BP (!) 166/75   Pulse 76   Wt 125 lb (56.7 kg)   BMI 23.62 kg/m    Physical Exam  Gen: Frail-appearing Ext: Left hip has stiffness and pain with external rotation of the foot on which showed wiper test.  She has pain with resisted flexion of the hip.  Other joints are normal. Abd: Soft, nontender, no hernia present in the left groin Neuro: Alert, conversational, very slow get up and go, slouched over unsteady gait, not currently using an assist device      Assessment & Plan:   Problem List Items Addressed This Visit       Unprioritized   Arthritis of left hip - Primary   New issue.  2 weeks of pain in her left groin, based on history and exam I think this is most likely left hip osteoarthritis.  Not sure why its flared up over the last 2 weeks.  No systemic symptoms like fever, walking okay, low risk for septic arthritis.  Will get x-ray of the left hip today.  We talked about supportive care including  anti-inflammatories.  I recommended meloxicam 7.5 mg daily for 5 days.  Based on the x-ray I think we can better estimate how long this is going to bother her.  She is frail, advanced age, frequent falls.  I recommended the use of a walker to help her with mobility.      Relevant Medications   meloxicam (MOBIC) 7.5 MG tablet   Other Relevant Orders   DG Hip Unilat W OR W/O Pelvis 2-3 Views Left    Meds ordered this encounter  Medications   meloxicam (MOBIC) 7.5 MG tablet    Sig: Take 1 tablet (7.5 mg total) by mouth daily as needed for pain.    Dispense:  15 tablet    Refill:  0    Ether Hercules, MD

## 2023-10-15 NOTE — Telephone Encounter (Signed)
 FYI Only or Action Required?: FYI only for provider  Patient was last seen in primary care on 09/21/2023 by Jess Morita, MD. Called Nurse Triage reporting Pain. Symptoms began 1.5 weeks. Interventions attempted: OTC medications: pain lotion. Symptoms are: gradually worsening.  Triage Disposition: See Physician Within 24 Hours  Patient/caregiver understands and will follow disposition?: Yes   Copied from CRM [id number]. Topic: Clinical - Red Word Triage >> Oct 15, 2023 12:24 PM Abigail D wrote: Red Word that prompted transfer to Nurse Triage: Pain in groin for a week and a half, has gotten worse as of this morning.  Put some medicated lotion on it but it has not helped. Reason for Disposition  Fever present > 3 days (72 hours)    Left area groin pain: unknown appearance of area and pt is not aware if swelling or redness to area: scheduled pt in office visit for today.  Answer Assessment - Initial Assessment Questions 1. ONSET: "When did the muscle aches or body pains start?"      1.5 weeks 2. LOCATION: "What part of your body is hurting?" (e.g., entire body, arms, legs)      Left groin area pain 3. SEVERITY: "How bad is the pain?" (Scale 1-10; or mild, moderate, severe)   - MILD (1-3): doesn't interfere with normal activities    - MODERATE (4-7): interferes with normal activities or awakens from sleep    - SEVERE (8-10):  excruciating pain, unable to do any normal activities      7/10 - pt has been applying pain lotion to area but today it is not helping 4. CAUSE: "What do you think is causing the pains?"     unknown 5. FEVER: "Have you been having fever?"     no 6. OTHER SYMPTOMS: "Do you have any other symptoms?" (e.g., chest pain, weakness, rash, cold or flu symptoms, weight loss)     No 7. PREGNANCY: "Is there any chance you are pregnant?" "When was your last menstrual period?"     N/a 8. TRAVEL: "Have you traveled out of the country in the last month?" (e.g., travel history,  exposures)     N/a  Protocols used: Muscle Aches and Body Pain-A-AH

## 2023-10-18 ENCOUNTER — Telehealth: Payer: Self-pay | Admitting: Student in an Organized Health Care Education/Training Program

## 2023-10-18 DIAGNOSIS — M1612 Unilateral primary osteoarthritis, left hip: Secondary | ICD-10-CM

## 2023-10-18 MED ORDER — MELOXICAM 7.5 MG PO TABS: 7.5000 mg | ORAL_TABLET | Freq: Every day | ORAL | 1 refills | Status: DC | PRN

## 2023-10-18 NOTE — Telephone Encounter (Signed)
 I spoke with the patient by phone to follow-up on her acute visit from Friday.  We had an x-ray, radiologist results not yet available, but to me it appears that she has advanced osteoarthritis of the left hip with significant loss of cartilage, nearly bone-on-bone.  We talked about limited treatment options for this given her advanced age and frailty.  I prescribed meloxicam  which she has been using daily and having some improvement in the discomfort, but still at times is functional limiting.  We talked about the safe use of meloxicam  on a limited basis.  I encouraged ambulation and activity as tolerated, especially with the help with assist devices for safety.  Patient has follow-up with her primary care physician in early July and they can consider more about the appropriateness of a surgical consultation.

## 2023-10-22 ENCOUNTER — Ambulatory Visit: Payer: Self-pay | Admitting: Student in an Organized Health Care Education/Training Program

## 2023-10-27 ENCOUNTER — Other Ambulatory Visit: Payer: Self-pay

## 2023-10-27 NOTE — Patient Instructions (Signed)
 Visit Information  Thank you for taking time to visit with me today. Please don't hesitate to contact me if I can be of assistance to you before our next scheduled telephone appointment.  Our next appointment is by telephone on 11/16/23 at 9:30am  Following is a copy of your care plan:   Goals Addressed             This Visit's Progress    VBCI RN Care Plan: HLD       Problems:  Chronic Disease Management support and education needs related to HLD Cognitive Deficits  Goal: Over the next 4 months the Patient will attend all scheduled medical appointments: with primary care provider and specialist as evidenced by keeping all scheduled appointments        demonstrate Improved and Ongoing adherence to prescribed treatment plan for HLD as evidenced by consistent medication compliance, symptom monitoring, lifestyle modifications take all medications exactly as prescribed and will call provider for medication related questions as evidenced by compliance with all medications     Interventions:   Hyperlipidemia Interventions: Medication review performed; medication list updated in electronic medical record.  Provider established cholesterol goals reviewed. Remains above goal, reports compliance with diet and medication Counseled on importance of regular laboratory monitoring as prescribed. Labs up to date. Provided HLD educational materials Reviewed role and benefits of statin for ASCVD risk reduction. Reports compliance with Crestor  Discussed strategies to manage statin-induced myalgias. Denies myalgias Reviewed importance of limiting foods high in cholesterol. Reports compliance. Reviewed exercise goals and target of 150 minutes per week Screening for signs and symptoms of depression related to chronic disease state Assessed social determinant of health barriers  Lab Results  Component Value Date   CHOL 322 (H) 05/14/2023   HDL 96.80 05/14/2023   LDLCALC 181 (H) 05/14/2023    LDLDIRECT 150.0 11/06/2022   TRIG 221.0 (H) 05/14/2023   CHOLHDL 3 05/14/2023    Patient Self-Care Activities:  Attend all scheduled provider appointments Call provider office for new concerns or questions  Take medications as prescribed   take all medications exactly as prescribed call doctor when you experience any new symptoms develop an exercise routine  Plan:  Telephone follow up appointment with care management team member scheduled for:  11-16-2023 at 9:30 am          VBCI RN Care Plan: HTN       Problems:  Chronic Disease Management support and education needs related to HTN Cognitive Deficits  Goal: Over the next 4 months the Patient will attend all scheduled medical appointments: with primary care provider and specialist as evidenced by keeping all scheduled appointments        take all medications exactly as prescribed and will call provider for medication related questions as evidenced by compliance with all medications    verbalize basic understanding of HTN disease process and self health management plan as evidenced by verbal explanation, recognizing symptoms, lifestyle modifications  Interventions:   Hypertension Interventions: Last practice recorded BP readings:  BP Readings from Last 3 Encounters:  08/23/23 (!) 146/61  06/24/23 114/78  05/14/23 136/68   Most recent eGFR/CrCl:  Lab Results  Component Value Date   EGFR 53 (L) 04/21/2022    No components found for: CRCL  Evaluation of current treatment plan related to hypertension self management and patient's adherence to plan as established by provider Provided education to patient re: stroke prevention, s/s of heart attack and stroke. Education and support provided. Reviewed medications  with patient and discussed importance of compliance. Reports compliance with all medications Counseled on adverse effects of illicit drug and excessive alcohol use in patients with high blood pressure . Denies illicit  drug or excessive alcohol use Counseled on the importance of exercise goals with target of 150 minutes per week Discussed plans with patient for ongoing care management follow up and provided patient with direct contact information for care management team Advised patient, providing education and rationale, to monitor blood pressure daily and record, calling PCP for findings outside established parameters. RNCM reviewed with patient goal SBP<140 DBP<90 Reviewed scheduled/upcoming provider appointments including: 09-21-2023 with PCP Advised patient to discuss elevated home readings with provider Provided education on prescribed diet heart healthy Discussed complications of poorly controlled blood pressure such as heart disease, stroke, circulatory complications, vision complications, kidney impairment, sexual dysfunction Screening for signs and symptoms of depression related to chronic disease state  Assessed social determinant of health barriers  Patient Self-Care Activities:  Attend all scheduled provider appointments Call provider office for new concerns or questions  Perform all self care activities independently  Take medications as prescribed   check blood pressure daily take blood pressure log to all doctor appointments call doctor for signs and symptoms of high blood pressure take medications for blood pressure exactly as prescribed  Plan:  Telephone follow up appointment with care management team member scheduled for:  11-16-2023 at 9:30 am             Patient verbalizes understanding of instructions and care plan provided today and agrees to view in MyChart. Active MyChart status and patient understanding of how to access instructions and care plan via MyChart confirmed with patient.     The patient has been provided with contact information for the care management team and has been advised to call with any health related questions or concerns.   Please call the care guide  team at [phone] if you need to cancel or reschedule your appointment.   Please call 1-800-273-TALK (toll free, 24 hour hotline) if you are experiencing a Mental Health or Behavioral Health Crisis or need someone to talk to.  Clotine Heiner A. Saverio Curling RN, BA, [facility], CRRN [place]  [facility] Population Health RN Care Manager Direct Dial: [phone]  Fax: [phone]

## 2023-10-27 NOTE — Patient Outreach (Signed)
 Complex Care Management   Visit Note  10/27/2023  Name:  Rachel Walton MRN: [id number] DOB: 07-17-1929  Situation: Referral received for Complex Care Management related to Hypertension and Hyperlipidemia. I obtained verbal consent from Patient.  Visit completed with patient  on the phone  Background:   Past Medical History:  Diagnosis Date   Acute gastric ulcer    Allergy    Anxiety    Atrophic vaginitis    Barrett's esophagus    Basal cell carcinoma of nose     under nose   Cataract    Depression    Diverticulosis    Esophageal stricture    Esophageal ulcer    Fatty liver    Gall stones 06/15/2013   GERD (gastroesophageal reflux disease)    Heart murmur    Hiatal hernia    Hx of adenomatous colonic polyps    Hyperlipidemia    Hypertension    Hypokalemia    IBS (irritable bowel syndrome)    Kyphosis    Lymphocytic colitis    Macular degeneration    ?wet or dry; ? 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    Tremor    Vitamin D  deficiency     Assessment: Patient Reported Symptoms:  Cognitive Cognitive Status: Alert and oriented to person, place, and time, Normal speech and language skills Cognitive/Intellectual Conditions Management [RPT]: None reported or documented in medical history or problem list   Health Maintenance Behaviors: Annual physical exam, Exercise, Healthy diet Healing Pattern: Unsure Health Facilitated by: Healthy diet  Neurological Neurological Review of Symptoms: Other: Oher Neurological Symptoms/Conditions [RPT]: Essential tremor Neurological Self-Management Outcome: 4 (good)  HEENT HEENT Symptoms Reported: Change or loss of hearing HEENT Conditions: Ear problem(s), Vision problem(s) Vision Problems: blindness/vision loss HEENT Management Strategies: Medication therapy, Routine screening, Medical  device, Coping strategies HEENT Self-Management Outcome: 3 (uncertain) HEENT Comment: Suffers from Age-related Macular Degeneration, dry, both eyes; Amblyopia ex anopsia of Right eye; also wears corrective lenses. Sees Ophthalmologist regularly. Also complains of ear fullness. Noted to be hard of hearing but not as pronounced as her husband's, so both patient and husbband speak with me va speaker phone to help each other hear everything & compare notes. Ear problem(s), Vision problem(s)  Cardiovascular Cardiovascular Symptoms Reported: No symptoms reported Does patient have uncontrolled Hypertension?: No Cardiovascular Conditions: Hypertension, High blood cholesterol Cardiovascular Management Strategies: Medication therapy, Coping strategies, Adequate rest, Diet modification, Exercise Weight: 125 lb (56.7 kg) Cardiovascular Self-Management Outcome: 4 (good)  Respiratory Respiratory Symptoms Reported: No symptoms reported    Endocrine Patient reports the following symptoms related to hypoglycemia or hyperglycemia : No symptoms reported Is patient diabetic?: No Endocrine Conditions: Vitamin D  deficiency Endocrine Management Strategies: Medication therapy, Diet modification, Routine screening, Exercise, Adequate rest Endocrine Self-Management Outcome: 4 (good)  Gastrointestinal Gastrointestinal Symptoms Reported: No symptoms reported Gastrointestinal Conditions: Other Other Gastrointestinal Conditions: PMH - Barrett's Esophagus Gastrointestinal Management Strategies: Medication therapy, Coping strategies, Diet modification Gastrointestinal Self-Management Outcome: 4 (good) Gastrointestinal Comment: on pantoprazole  Nutrition Risk Screen (CP): No indicators present  Genitourinary Genitourinary Symptoms Reported: No symptoms reported    Integumentary Integumentary Symptoms Reported: No symptoms reported    Musculoskeletal Musculoskelatal Symptoms Reviewed: Difficulty walking, Other Other  Musculoskeletal Symptoms: complaining of increased arthritic pain to her Left Hip, wants to be  able to get cortizone injections for her hip just like her husband gets cortizone injections for his osteoarthritic knee. Encouraged her to speak to her PCP, Dr Paulla Bossier at her scheduled visit on 11/11/23 to obtain a possible referral to be seen by her husband's Orthopedic MD, Dr. Jacquline Maw (this is the MD patient prefers to be evaluated for her left hip issue) Additional Musculoskeletal Details: suffers from Kyphosis and Osteopenia with a high risk for fractures Musculoskeletal Conditions: Osteopenia, Osteoarthritis Other Musculoskeletal Conditions: Kyphosis Musculoskeletal Management Strategies: Coping strategies, Routine screening, Exercise, Adequate rest, Medication therapy Musculoskeletal Self-Management Outcome: 3 (uncertain) Falls in the past year?: No Number of falls in past year: 1 or less Was there an injury with Fall?: No Fall Risk Category Calculator: 0 Patient Fall Risk Level: Low Fall Risk Fall risk Follow up: Falls evaluation completed, Falls prevention discussed, Education provided  Psychosocial   Behavioral Health Conditions: Anxiety Behavioral Management Strategies: Medication therapy, Coping strategies, Adequate rest, Support system Behavioral Health Self-Management Outcome: 4 (good) Major Change/Loss/Stressor/Fears (CP): Medical condition, self, Medical condition, family Quality of Family Relationships: involved, helpful, supportive Do you feel physically threatened by others?: No      08/23/2023   11:43 AM  Depression screen PHQ 2/9  Decreased Interest 0  Down, Depressed, Hopeless 1  PHQ - 2 Score 1    Vitals:   10/27/23 1705  BP: 134/61    Medications Reviewed Today     Reviewed by Randye Buttner, RN (Registered Nurse) on 10/27/23 at 1005  Med List Status: <None>   Medication Order Taking? Sig Documenting Provider Last Dose Status Informant  acetaminophen   (TYLENOL ) 500 MG tablet [id number] Yes Take 500 mg by mouth every 6 (six) hours as needed for mild pain. [provider]  Active Self  azelastine  (ASTELIN ) 0.1 % nasal spray [id number] Yes Place 1 spray into both nostrils 2 (two) times daily. Use in each nostril as directed Jess Morita, MD  Active   clonazePAM  (KLONOPIN ) 0.5 MG tablet [id number] Yes TAKE 1/2 TO 1 TABLET BY MOUTH DAILY AS NEEDED Tabori, Katherine E, MD  Active   famotidine  (PEPCID ) 20 MG tablet [id number] Yes Take 20 mg by mouth 2 (two) times daily. Tabori, Katherine E, MD  Active Self  fluticasone  (FLONASE ) 50 MCG/ACT nasal spray [id number] Yes Place 2 sprays into both nostrils daily. Gaylyn Keas, Mary-Margaret, FNP  Active   lisinopril  (ZESTRIL ) 20 MG tablet [id number] Yes TAKE ONE (1) TABLET BY MOUTH EVERY DAY Jess Morita, MD  Active   loratadine  (CLARITIN ) 10 MG tablet [id number] Yes Take 1 tablet (10 mg total) by mouth daily. Tabori, Katherine E, MD  Active   meloxicam  (MOBIC ) 7.5 MG tablet [id number] Yes Take 1 tablet (7.5 mg total) by mouth daily as needed for pain. Ether Hercules, MD  Active   mesalamine  (LIALDA ) 1.2 g EC tablet [id number] Yes Take 1 tablet (1.2 g total) by mouth 2 (two) times daily. Tabori, Katherine E, MD  Active   Multiple Vitamins-Minerals (PRESERVISION AREDS PO) [id number] Yes Take by mouth. [provider]  Active   naphazoline-pheniramine (NAPHCON-A) 0.025-0.3 % ophthalmic solution [id number] Yes Place 1 drop into both eyes as needed. [provider]  Active   Omega-3 Fatty Acids  (FISH OIL) 1200 MG CPDR [id number] Yes Take by mouth. [provider]  Active   pantoprazole  (PROTONIX ) 40 MG tablet [id number] Yes TAKE ONE (1) TABLET BY MOUTH EVERY DAY Jess Morita, MD  Active   potassium chloride  (KLOR-CON )  10 MEQ tablet [id number] Yes TAKE ONE (1) TABLET BY MOUTH EVERY DAY Tabori, Katherine E, MD  Active   rosuvastatin  (CRESTOR ) 10 MG tablet [id number] Yes TAKE  ONE (1) TABLET BY MOUTH EVERY DAY Jess Morita, MD  Active             Recommendation:   PCP Follow-up with Dr. Paulla Bossier on 11/11/23 and discuss with PCP need for referral to Orthopedic Specialist Dr. Danielle Idylwood regarding patient's left hip pain.  Follow Up Plan:   Telephone follow up appointment date/time:  11/16/23 @ 9:30am  Bartholomew Light A. Saverio Curling RN, BA, [facility], CRRN [place]  [facility] Population Health RN Care Manager Direct Dial: [phone]  Fax: [phone]

## 2023-11-09 ENCOUNTER — Other Ambulatory Visit: Payer: Self-pay | Admitting: Family Medicine

## 2023-11-09 DIAGNOSIS — F419 Anxiety disorder, unspecified: Secondary | ICD-10-CM

## 2023-11-11 ENCOUNTER — Telehealth: Payer: Self-pay | Admitting: Family Medicine

## 2023-11-11 ENCOUNTER — Ambulatory Visit: Payer: Medicare PPO | Admitting: Family Medicine

## 2023-11-11 NOTE — Telephone Encounter (Signed)
 error

## 2023-11-16 ENCOUNTER — Other Ambulatory Visit: Payer: Self-pay

## 2023-11-16 NOTE — Patient Outreach (Signed)
 Complex Care Management   Visit Note  11/16/2023  Name:  Rachel Walton MRN: [id number] DOB: June 11, 1929  Situation: Referral received for Complex Care Management related to Hypertension & Hyperlipidemia. I obtained verbal consent from Patient.  Visit completed with patient  on the phone  Background:   Past Medical History:  Diagnosis Date   Acute gastric ulcer    Allergy    Anxiety    Atrophic vaginitis    Barrett's esophagus    Basal cell carcinoma of nose     under nose   Cataract    Depression    Diverticulosis    Esophageal stricture    Esophageal ulcer    Fatty liver    Gall stones 06/15/2013   GERD (gastroesophageal reflux disease)    Heart murmur    Hiatal hernia    Hx of adenomatous colonic polyps    Hyperlipidemia    Hypertension    Hypokalemia    IBS (irritable bowel syndrome)    Kyphosis    Lymphocytic colitis    Macular degeneration    ?wet or dry; ? 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    Tremor    Vitamin D  deficiency     Assessment: Patient Reported Symptoms:  Cognitive Cognitive Status: Alert and oriented to person, place, and time, Normal speech and language skills Cognitive/Intellectual Conditions Management [RPT]: None reported or documented in medical history or problem list   Health Maintenance Behaviors: Annual physical exam, Healthy diet Healing Pattern: Unsure Health Facilitated by: Healthy diet, Rest  Neurological Neurological Review of Symptoms: Other: Oher Neurological Symptoms/Conditions [RPT]: Essential tremor Neurological Management Strategies: Coping strategies, Adequate rest, Routine screening Neurological Self-Management Outcome: 4 (good)  HEENT HEENT Symptoms Reported: Change or loss of hearing, Sudden change or loss of vision HEENT Management Strategies: Medication therapy, Routine  screening, Medical device, Coping strategies HEENT Self-Management Outcome: 3 (uncertain) Ear problem(s), Vision problem(s)  Cardiovascular Cardiovascular Symptoms Reported: No symptoms reported Does patient have uncontrolled Hypertension?: No Cardiovascular Management Strategies: Medication therapy, Coping strategies, Adequate rest, Diet modification, Exercise Cardiovascular Self-Management Outcome: 4 (good)  Respiratory Respiratory Symptoms Reported: No symptoms reported    Endocrine Endocrine Symptoms Reported: No symptoms reported Is patient diabetic?: No Endocrine Self-Management Outcome: 4 (good)  Gastrointestinal Gastrointestinal Symptoms Reported: No symptoms reported Gastrointestinal Management Strategies: Medication therapy, Coping strategies, Diet modification Gastrointestinal Self-Management Outcome: 4 (good) Gastrointestinal Comment: takes pantoprazole  Nutrition Risk Screen (CP): No indicators present  Genitourinary Genitourinary Symptoms Reported: No symptoms reported    Integumentary Integumentary Symptoms Reported: No symptoms reported    Musculoskeletal Musculoskelatal Symptoms Reviewed: Difficulty walking, Limited mobility Additional Musculoskeletal Details: suffers from Kyphosis and Osteopenia with a high risk for fractures Musculoskeletal Management Strategies: Coping strategies, Routine screening, Exercise, Adequate rest, Medication therapy Musculoskeletal Self-Management Outcome: 3 (uncertain) Falls in the past year?: No    Psychosocial Psychosocial Symptoms Reported: No symptoms reported Behavioral Management Strategies: Medication therapy, Coping strategies, Adequate rest, Support system Behavioral Health Self-Management Outcome: 4 (good) Major Change/Loss/Stressor/Fears (CP): Medical condition, self, Medical condition, family Quality of Family Relationships: supportive, helpful, involved Do you feel physically threatened by others?: No      08/23/2023    11:43 AM  Depression screen PHQ 2/9  Decreased Interest 0  Down, Depressed, Hopeless 1  PHQ - 2  Score 1    There were no vitals filed for this visit.  Medications Reviewed Today     Reviewed by Gordy Channing LABOR, RN (Registered Nurse) on 11/16/23 at [phone]  Med List Status: <None>   Medication Order Taking? Sig Documenting Provider Last Dose Status Informant  acetaminophen  (TYLENOL ) 500 MG tablet [id number] Yes Take 500 mg by mouth every 6 (six) hours as needed for mild pain. [provider]  Active Self  azelastine  (ASTELIN ) 0.1 % nasal spray [id number] Yes Place 1 spray into both nostrils 2 (two) times daily. Use in each nostril as directed Mahlon Comer BRAVO, MD  Active   clonazePAM  (KLONOPIN ) 0.5 MG tablet [id number] Yes TAKE 1/2 TO 1 TABLET BY MOUTH DAILY AS NEEDED Tabori, Katherine E, MD  Active   famotidine  (PEPCID ) 20 MG tablet [id number] Yes Take 20 mg by mouth 2 (two) times daily. Tabori, Katherine E, MD  Active Self  fluticasone  (FLONASE ) 50 MCG/ACT nasal spray [id number] Yes Place 2 sprays into both nostrils daily. Gladis, Mary-Margaret, FNP  Active   lisinopril  (ZESTRIL ) 20 MG tablet [id number] Yes TAKE ONE (1) TABLET BY MOUTH EVERY DAY Mahlon Comer BRAVO, MD  Active   loratadine  (CLARITIN ) 10 MG tablet [id number] Yes Take 1 tablet (10 mg total) by mouth daily. Tabori, Katherine E, MD  Active   meloxicam  (MOBIC ) 7.5 MG tablet [id number] Yes Take 1 tablet (7.5 mg total) by mouth daily as needed for pain. Jerrell Cleatus Ned, MD  Active   mesalamine  (LIALDA ) 1.2 g EC tablet [id number] Yes Take 1 tablet (1.2 g total) by mouth 2 (two) times daily. Tabori, Katherine E, MD  Active   Multiple Vitamins-Minerals (PRESERVISION AREDS PO) [id number] Yes Take by mouth. [provider]  Active   naphazoline-pheniramine (NAPHCON-A) 0.025-0.3 % ophthalmic solution [id number] Yes Place 1 drop into both eyes as needed. [provider]  Active   Omega-3 Fatty Acids  (FISH OIL)  1200 MG CPDR [id number] Yes Take by mouth. [provider]  Active   pantoprazole  (PROTONIX ) 40 MG tablet [id number] Yes TAKE ONE (1) TABLET BY MOUTH EVERY DAY Tabori, Katherine E, MD  Active   potassium chloride  (KLOR-CON ) 10 MEQ tablet [id number] Yes TAKE ONE (1) TABLET BY MOUTH EVERY DAY Tabori, Katherine E, MD  Active   rosuvastatin  (CRESTOR ) 10 MG tablet [id number] Yes TAKE ONE (1) TABLET BY MOUTH EVERY DAY Tabori, Katherine E, MD  Active             Recommendation:   PCP Follow-up 12/23/23 @ 2pm  Follow Up Plan:   Telephone follow up appointment date/time:  12/22/23 @ 9:30 am  Amila Callies A. Gordy RN, BA, [facility], CRRN [place]  [facility] Population Health RN Care Manager Direct Dial: [phone]  Fax: [phone]

## 2023-11-16 NOTE — Patient Instructions (Signed)
 Visit Information  Thank you for taking time to visit with me today. Please don't hesitate to contact me if I can be of assistance to you before our next scheduled telephone appointment.  Our next appointment is by telephone on 12/22/23 at 9:30 am  Following is a copy of your care plan:   Goals Addressed             This Visit's Progress    VBCI RN Care Plan: HLD       Problems:  Chronic Disease Management support and education needs related to HLD Cognitive Deficits  Goal: Over the next 3 months the Patient will attend all scheduled medical appointments: with primary care provider and specialist as evidenced by keeping all scheduled appointments        demonstrate Improved and Ongoing adherence to prescribed treatment plan for HLD as evidenced by consistent medication compliance, symptom monitoring, lifestyle modifications take all medications exactly as prescribed and will call provider for medication related questions as evidenced by compliance with all medications     Interventions:   Hyperlipidemia Interventions: Medication review performed; medication list updated in electronic medical record.  Provider established cholesterol goals reviewed. Remains above goal, reports compliance with diet and medication Counseled on importance of regular laboratory monitoring as prescribed. Labs up to date. Provided HLD educational materials Reviewed role and benefits of statin for ASCVD risk reduction. Reports compliance with Crestor  Discussed strategies to manage statin-induced myalgias. Denies myalgias Reviewed importance of limiting foods high in cholesterol. Reports compliance. Reviewed exercise goals and target of 150 minutes per week Screening for signs and symptoms of depression related to chronic disease state Assessed social determinant of health barriers  Lab Results  Component Value Date   CHOL 322 (H) 05/14/2023   HDL 96.80 05/14/2023   LDLCALC 181 (H) 05/14/2023    LDLDIRECT 150.0 11/06/2022   TRIG 221.0 (H) 05/14/2023   CHOLHDL 3 05/14/2023    Patient Self-Care Activities:  Attend all scheduled provider appointments Call provider office for new concerns or questions  Take medications as prescribed   take all medications exactly as prescribed call doctor when you experience any new symptoms develop an exercise routine  Plan:  Telephone follow up appointment with care management team member scheduled for:  12/22/23 at 9:30 am          VBCI RN Care Plan: HTN       Problems:  Chronic Disease Management support and education needs related to HTN Cognitive Deficits  Goal: Over the next 3 months the Patient will attend all scheduled medical appointments: with primary care provider and specialist as evidenced by keeping all scheduled appointments        take all medications exactly as prescribed and will call provider for medication related questions as evidenced by compliance with all medications    verbalize basic understanding of HTN disease process and self health management plan as evidenced by verbal explanation, recognizing symptoms, lifestyle modifications  Interventions:   Hypertension Interventions: Last practice recorded BP readings:  BP Readings from Last 3 Encounters:  08/23/23 (!) 146/61  06/24/23 114/78  05/14/23 136/68   Most recent eGFR/CrCl:  Lab Results  Component Value Date   EGFR 53 (L) 04/21/2022    No components found for: CRCL  Evaluation of current treatment plan related to hypertension self management and patient's adherence to plan as established by provider Provided education to patient re: stroke prevention, s/s of heart attack and stroke. Education and support provided. Reviewed  medications with patient and discussed importance of compliance. Reports compliance with all medications Counseled on adverse effects of illicit drug and excessive alcohol use in patients with high blood pressure . Denies illicit  drug or excessive alcohol use Counseled on the importance of exercise goals with target of 150 minutes per week Discussed plans with patient for ongoing care management follow up and provided patient with direct contact information for care management team Advised patient, providing education and rationale, to monitor blood pressure daily and record, calling PCP for findings outside established parameters. RNCM reviewed with patient goal SBP<140 DBP<90 Reviewed scheduled/upcoming provider appointments including: 09-21-2023 with PCP Advised patient to discuss elevated home readings with provider Provided education on prescribed diet heart healthy Discussed complications of poorly controlled blood pressure such as heart disease, stroke, circulatory complications, vision complications, kidney impairment, sexual dysfunction Screening for signs and symptoms of depression related to chronic disease state  Assessed social determinant of health barriers  Patient Self-Care Activities:  Attend all scheduled provider appointments Call provider office for new concerns or questions  Perform all self care activities independently  Take medications as prescribed   check blood pressure daily take blood pressure log to all doctor appointments call doctor for signs and symptoms of high blood pressure take medications for blood pressure exactly as prescribed  Plan:  Telephone follow up appointment with care management team member scheduled for:  12/22/2023 at 9:30 am             Patient verbalizes understanding of instructions and care plan provided today and agrees to view in MyChart. Active MyChart status and patient understanding of how to access instructions and care plan via MyChart confirmed with patient.     The patient has been provided with contact information for the care management team and has been advised to call with any health related questions or concerns.   Please call the care guide  team at [phone] if you need to cancel or reschedule your appointment.   Please call 1-800-273-TALK (toll free, 24 hour hotline) if you are experiencing a Mental Health or Behavioral Health Crisis or need someone to talk to.  Dayshia Ballinas A. Gordy RN, BA, [facility], CRRN [place]  [facility] Population Health RN Care Manager Direct Dial: [phone]  Fax: [phone]

## 2023-12-09 ENCOUNTER — Ambulatory Visit (INDEPENDENT_AMBULATORY_CARE_PROVIDER_SITE_OTHER): Admitting: Audiology

## 2023-12-09 ENCOUNTER — Institutional Professional Consult (permissible substitution) (INDEPENDENT_AMBULATORY_CARE_PROVIDER_SITE_OTHER): Admitting: Otolaryngology

## 2023-12-22 ENCOUNTER — Other Ambulatory Visit: Payer: Self-pay

## 2023-12-22 ENCOUNTER — Other Ambulatory Visit

## 2023-12-23 ENCOUNTER — Encounter: Payer: Self-pay | Admitting: Family Medicine

## 2023-12-23 ENCOUNTER — Ambulatory Visit: Admitting: Family Medicine

## 2023-12-23 VITALS — BP 130/70 | HR 65 | Temp 98.2°F | Wt 127.8 lb

## 2023-12-23 DIAGNOSIS — R351 Nocturia: Secondary | ICD-10-CM | POA: Diagnosis not present

## 2023-12-23 DIAGNOSIS — R5383 Other fatigue: Secondary | ICD-10-CM | POA: Diagnosis not present

## 2023-12-23 NOTE — Patient Instructions (Signed)
 Schedule your complete physical for January No need for labs today- YAY! Keep up the good work on healthy diet and regular exercise- you look great! Call with any questions or concerns HAPPY BIRTHDAY!!!

## 2023-12-23 NOTE — Progress Notes (Signed)
   Subjective:    Patient ID: Rachel Walton, female    DOB: 1929/08/27, 89 y.o.   MRN: [id number]  HPI 'i just don't have the strength to do much'- 'i don't have the energy like I used to'.  pt turns 89 tomorrow.  Pt is excited about her Roomba vacuum.  Pt is waking ~3x/night to use the bathroom.  Wants to know if anything can be done.  Otherwise sleeping well.  Pt reports eating well- [employer] Chicken Sandwich, [place].  No CP, SOB, HA's, visual changes, abd pain, N/V.   Review of Systems For ROS see HPI     Objective:   Physical Exam Vitals reviewed.  Constitutional:      General: She is not in acute distress.    Appearance: Normal appearance. She is well-developed. She is not ill-appearing.  HENT:     Head: Normocephalic and atraumatic.  Eyes:     Conjunctiva/sclera: Conjunctivae normal.     Pupils: Pupils are equal, round, and reactive to light.  Neck:     Thyroid : No thyromegaly.  Cardiovascular:     Rate and Rhythm: Normal rate and regular rhythm.     Heart sounds: Normal heart sounds. No murmur heard. Pulmonary:     Effort: Pulmonary effort is normal. No respiratory distress.     Breath sounds: Normal breath sounds.  Abdominal:     General: There is no distension.     Palpations: Abdomen is soft.     Tenderness: There is no abdominal tenderness.  Musculoskeletal:     Cervical back: Normal range of motion and neck supple.  Lymphadenopathy:     Cervical: No cervical adenopathy.  Skin:    General: Skin is warm and dry.  Neurological:     Mental Status: She is alert and oriented to person, place, and time. Mental status is at baseline.  Psychiatric:        Mood and Affect: Mood normal.        Behavior: Behavior normal.        Thought Content: Thought content normal.           Assessment & Plan:

## 2023-12-23 NOTE — Patient Outreach (Signed)
 Complex Care Management   Visit Note  12/22/2023  Name:  Rachel Walton MRN: [id number] DOB: [birth year]  Situation: Referral received for Complex Care Management related to Hypertension and Hyperlipidemia I obtained verbal consent from Patient.  Visit completed with patient  on the phone  Background:   Past Medical History:  Diagnosis Date   Acute gastric ulcer    Allergy    Anxiety    Atrophic vaginitis    Barrett's esophagus    Basal cell carcinoma of nose     under nose   Cataract    Depression    Diverticulosis    Esophageal stricture    Esophageal ulcer    Fatty liver    Gall stones 06/15/2013   GERD (gastroesophageal reflux disease)    Heart murmur    Hiatal hernia    Hx of adenomatous colonic polyps    Hyperlipidemia    Hypertension    Hypokalemia    IBS (irritable bowel syndrome)    Kyphosis    Lymphocytic colitis    Macular degeneration    ?wet or dry; ? 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    Tremor    Vitamin D  deficiency     Assessment: Patient Reported Symptoms:  Cognitive Cognitive Status: Alert and oriented to person, place, and time, Normal speech and language skills Cognitive/Intellectual Conditions Management [RPT]: None reported or documented in medical history or problem list   Health Maintenance Behaviors: Annual physical exam, Healthy diet Healing Pattern: Unsure Health Facilitated by: Healthy diet, Rest  Neurological Neurological Review of Symptoms: Other: [place] Neurological Symptoms/Conditions [RPT]: Essential tremor Neurological Management Strategies: Coping strategies, Adequate rest, Routine screening Neurological Self-Management Outcome: 4 (good)  HEENT HEENT Symptoms Reported: Change or loss of hearing, Sudden change or loss of vision HEENT Management Strategies: Coping strategies,  Medication therapy, Routine screening HEENT Self-Management Outcome: 3 (uncertain) Ear problem(s), Vision problem(s)  Cardiovascular Cardiovascular Symptoms Reported: No symptoms reported Cardiovascular Management Strategies: Coping strategies, Medication therapy, Adequate rest, Diet modification, Exercise Cardiovascular Self-Management Outcome: 4 (good)  Respiratory Respiratory Symptoms Reported: No symptoms reported    Endocrine Endocrine Symptoms Reported: No symptoms reported Is patient diabetic?: No Endocrine Self-Management Outcome: 4 (good)  Gastrointestinal Gastrointestinal Symptoms Reported: No symptoms reported Gastrointestinal Management Strategies: Medication therapy, Coping strategies, Diet modification Gastrointestinal Self-Management Outcome: 4 (good)    Genitourinary Genitourinary Symptoms Reported: No symptoms reported    Integumentary Integumentary Symptoms Reported: No symptoms reported    Musculoskeletal Musculoskelatal Symptoms Reviewed: Limited mobility, Difficulty walking Musculoskeletal Management Strategies: Coping strategies, Routine screening, Exercise, Adequate rest, Medication therapy Musculoskeletal Self-Management Outcome: 3 (uncertain) Falls in the past year?: No    Psychosocial Psychosocial Symptoms Reported: No symptoms reported            12/23/2023    1:34 PM  Depression screen PHQ 2/9  Decreased Interest 1  Down, Depressed, Hopeless 1  PHQ - 2 Score 2  Altered sleeping 1  Tired, decreased energy 2  Change in appetite 1  Feeling bad or failure about yourself  1  Trouble concentrating 1  Moving slowly or fidgety/restless 0  Suicidal thoughts 0  PHQ-9 Score 8  Difficult doing work/chores Somewhat difficult    There were no vitals filed for this visit.  Medications Reviewed Today  Reviewed by Gordy Channing LABOR, RN (Registered Nurse) on 12/22/23 at 1043  Med List Status: <None>   Medication Order Taking? Sig Documenting Provider  Last Dose Status Informant  acetaminophen  (TYLENOL ) 500 MG tablet [id number] Yes Take 500 mg by mouth every 6 (six) hours as needed for mild pain. [provider]  Active Self  azelastine  (ASTELIN ) 0.1 % nasal spray [id number] Yes Place 1 spray into both nostrils 2 (two) times daily. Use in each nostril as directed Mahlon Comer BRAVO, MD  Active   clonazePAM  (KLONOPIN ) 0.5 MG tablet [id number] Yes TAKE 1/2 TO 1 TABLET BY MOUTH DAILY AS NEEDED Tabori, Katherine E, MD  Active   famotidine  (PEPCID ) 20 MG tablet [id number] Yes Take 20 mg by mouth 2 (two) times daily. Tabori, Katherine E, MD  Active Self  fluticasone  (FLONASE ) 50 MCG/ACT nasal spray [id number] Yes Place 2 sprays into both nostrils daily. Gladis, Mary-Margaret, FNP  Active   lisinopril  (ZESTRIL ) 20 MG tablet [id number] Yes TAKE ONE (1) TABLET BY MOUTH EVERY DAY Mahlon Comer BRAVO, MD  Active   loratadine  (CLARITIN ) 10 MG tablet [id number] Yes Take 1 tablet (10 mg total) by mouth daily. Tabori, Katherine E, MD  Active   meloxicam  (MOBIC ) 7.5 MG tablet [id number] Yes Take 1 tablet (7.5 mg total) by mouth daily as needed for pain. Jerrell Cleatus Ned, MD  Active   mesalamine  (LIALDA ) 1.2 g EC tablet [id number] Yes Take 1 tablet (1.2 g total) by mouth 2 (two) times daily. Tabori, Katherine E, MD  Active   Multiple Vitamins-Minerals (PRESERVISION AREDS PO) [id number] Yes Take by mouth. [provider]  Active   naphazoline-pheniramine (NAPHCON-A) 0.025-0.3 % ophthalmic solution [id number] Yes Place 1 drop into both eyes as needed. [provider]  Active   Omega-3 Fatty Acids  (FISH OIL) 1200 MG CPDR [id number] Yes Take by mouth. [provider]  Active   pantoprazole  (PROTONIX ) 40 MG tablet [id number] Yes TAKE ONE (1) TABLET BY MOUTH EVERY DAY Tabori, Katherine E, MD  Active   potassium chloride  (KLOR-CON ) 10 MEQ tablet [id number] Yes TAKE ONE (1) TABLET BY MOUTH EVERY DAY Tabori, Katherine E, MD  Active   rosuvastatin   (CRESTOR ) 10 MG tablet [id number] Yes TAKE ONE (1) TABLET BY MOUTH EVERY DAY Tabori, Katherine E, MD  Active             Recommendation:   Continue Current Plan of Care  Follow Up Plan:   Telephone follow up appointment date/time:  01/27/24 at 9:30  [facility] A. Gordy RN, BA, [facility], CRRN [place]  [facility] Population Health RN Care Manager Direct Dial: [phone]  Fax: [phone]

## 2023-12-23 NOTE — Patient Instructions (Signed)
 Visit Information  Thank you for taking time to visit with me today. Please don't hesitate to contact me if I can be of assistance to you before our next scheduled telephone appointment.  Our next appointment is by telephone on 01/27/2024 at 9:30 am.  Following is a copy of your care plan:   Goals Addressed             This Visit's Progress    VBCI RN Care Plan: HLD       Problems:  Chronic Disease Management support and education needs related to HLD Cognitive Deficits  Goal: Over the next 3 months the Patient will attend all scheduled medical appointments: with primary care provider and specialist as evidenced by keeping all scheduled appointments        demonstrate Improved and Ongoing adherence to prescribed treatment plan for HLD as evidenced by consistent medication compliance, symptom monitoring, lifestyle modifications take all medications exactly as prescribed and will call provider for medication related questions as evidenced by compliance with all medications     Interventions:   Hyperlipidemia Interventions: Medication review performed; medication list updated in electronic medical record.  Provider established cholesterol goals reviewed. Remains above goal, reports compliance with diet and medication Counseled on importance of regular laboratory monitoring as prescribed. Labs up to date. Provided HLD educational materials Reviewed role and benefits of statin for ASCVD risk reduction. Reports compliance with Crestor  Discussed strategies to manage statin-induced myalgias. Denies myalgias Reviewed importance of limiting foods high in cholesterol. Reports compliance. Reviewed exercise goals and target of 150 minutes per week Screening for signs and symptoms of depression related to chronic disease state Assessed social determinant of health barriers  Lab Results  Component Value Date   CHOL 322 (H) 05/14/2023   HDL 96.80 05/14/2023   LDLCALC 181 (H) 05/14/2023    LDLDIRECT 150.0 11/06/2022   TRIG 221.0 (H) 05/14/2023   CHOLHDL 3 05/14/2023    Patient Self-Care Activities:  Attend all scheduled provider appointments Call provider office for new concerns or questions  Take medications as prescribed   take all medications exactly as prescribed call doctor when you experience any new symptoms develop an exercise routine  Plan:  Telephone follow up appointment with care management team member scheduled for:  01/27/24 at 9:30 am          VBCI RN Care Plan: HTN       Problems:  Chronic Disease Management support and education needs related to HTN Cognitive Deficits  Goal: Over the next 3 months the Patient will attend all scheduled medical appointments: with primary care provider and specialist as evidenced by keeping all scheduled appointments        take all medications exactly as prescribed and will call provider for medication related questions as evidenced by compliance with all medications    verbalize basic understanding of HTN disease process and self health management plan as evidenced by verbal explanation, recognizing symptoms, lifestyle modifications  Interventions:   Hypertension Interventions: Last practice recorded BP readings:  BP Readings from Last 3 Encounters:  08/23/23 (!) 146/61  06/24/23 114/78  05/14/23 136/68   Most recent eGFR/CrCl:  Lab Results  Component Value Date   EGFR 53 (L) 04/21/2022    No components found for: CRCL  Evaluation of current treatment plan related to hypertension self management and patient's adherence to plan as established by provider Provided education to patient re: stroke prevention, s/s of heart attack and stroke. Education and support provided. Reviewed  medications with patient and discussed importance of compliance. Reports compliance with all medications Counseled on adverse effects of illicit drug and excessive alcohol use in patients with high blood pressure . Denies illicit  drug or excessive alcohol use Counseled on the importance of exercise goals with target of 150 minutes per week Discussed plans with patient for ongoing care management follow up and provided patient with direct contact information for care management team Advised patient, providing education and rationale, to monitor blood pressure daily and record, calling PCP for findings outside established parameters. RNCM reviewed with patient goal SBP<140 DBP<90 Reviewed scheduled/upcoming provider appointments including: 09-21-2023 with PCP Advised patient to discuss elevated home readings with provider Provided education on prescribed diet heart healthy Discussed complications of poorly controlled blood pressure such as heart disease, stroke, circulatory complications, vision complications, kidney impairment, sexual dysfunction Screening for signs and symptoms of depression related to chronic disease state  Assessed social determinant of health barriers  Patient Self-Care Activities:  Attend all scheduled provider appointments Call provider office for new concerns or questions  Perform all self care activities independently  Take medications as prescribed   check blood pressure daily take blood pressure log to all doctor appointments call doctor for signs and symptoms of high blood pressure take medications for blood pressure exactly as prescribed  Plan:  Telephone follow up appointment with care management team member scheduled for:  01/27/2024 at 9:30 am             Patient verbalizes understanding of instructions and care plan provided today and agrees to view in MyChart. Active MyChart status and patient understanding of how to access instructions and care plan via MyChart confirmed with patient.     The patient has been provided with contact information for the care management team and has been advised to call with any health related questions or concerns.   Please call the care guide  team at [phone] if you need to cancel or reschedule your appointment.   Please call 1-800-273-TALK (toll free, 24 hour hotline) if you are experiencing a Mental Health or Behavioral Health Crisis or need someone to talk to.  Meisha Salone A. Gordy RN, BA, [facility], CRRN [place]  [facility] Population Health RN Care Manager Direct Dial: [phone]  Fax: [phone]

## 2024-01-09 DIAGNOSIS — R351 Nocturia: Secondary | ICD-10-CM | POA: Insufficient documentation

## 2024-01-09 DIAGNOSIS — R5383 Other fatigue: Secondary | ICD-10-CM | POA: Insufficient documentation

## 2024-01-09 NOTE — Assessment & Plan Note (Signed)
 Ongoing issue for pt.  Reviewed that she is about to be 89 yrs old and is still living independently, keep up her house, and overall doing remarkably well.  Again discussed that she will not have the same level of energy that she previously did and that is a natural consequence of aging.  Reviewed the labs done in May- no need to repeat.  Pt felt reassured after our discussion and admits that she is likely expecting too much.  Will continue to follow.

## 2024-01-09 NOTE — Assessment & Plan Note (Signed)
 New to provider, ongoing for pt.  Discussed that any of the OAB medications can cause memory issues and increase fall risk.  Pt is not interested in this and prefers to just use the bathroom as needed nightly.

## 2024-01-25 ENCOUNTER — Other Ambulatory Visit: Payer: Self-pay | Admitting: Family Medicine

## 2024-01-25 DIAGNOSIS — F419 Anxiety disorder, unspecified: Secondary | ICD-10-CM

## 2024-01-27 ENCOUNTER — Other Ambulatory Visit: Payer: Self-pay

## 2024-02-01 NOTE — Patient Outreach (Signed)
 01/27/24  Spoke to Dickey Costa at the scheduled time for both her and her husband William's CCM RNCM FU encounter which they engage in together via speaker phone with this nurse. Patient states her husband Sharnae Winfree was hospitalized after suffering a fall at home. Mr. Kulesza underwent an ORIF of fractured hip but suffered a massive MI while being operated on and his recovery course included resuscitation and transfer to CICU on a ventilator. Patient's husband was subsequently extubated but appears to have suffered anoxic brain injury complicated by his previously underlying dementia. Patient's husband is expected to dc to Rehab today despite poor prognosis for recovery. [place] Natarajan wants her husband to remain a FULL CODE at this time.  Support provided to patient, Mrs. Jewel while gently discussing what level of care patient's husband would want at this point in time, as well as reviewing her husband's current goals of care.  Jeananne Bedwell A. Gordy RN, BA, [facility], CRRN [place]  [facility] Population Health RN Care Manager Direct Dial: [phone]  Fax: [phone]

## 2024-02-07 ENCOUNTER — Other Ambulatory Visit: Payer: Self-pay | Admitting: Student in an Organized Health Care Education/Training Program

## 2024-02-07 ENCOUNTER — Other Ambulatory Visit: Payer: Self-pay | Admitting: Internal Medicine

## 2024-02-07 ENCOUNTER — Ambulatory Visit: Payer: Self-pay

## 2024-02-07 ENCOUNTER — Other Ambulatory Visit: Payer: Self-pay | Admitting: Family Medicine

## 2024-02-07 DIAGNOSIS — M1612 Unilateral primary osteoarthritis, left hip: Secondary | ICD-10-CM

## 2024-02-07 NOTE — Telephone Encounter (Signed)
 Requested Prescriptions   Pending Prescriptions Disp Refills   meloxicam  (MOBIC ) 7.5 MG tablet [Pharmacy Med Name: MELOXICAM  7.5 MG TAB] 30 tablet 1    Sig: TAKE 1 TABLET BY MOUTH DAILY AS NEEDED FOR PAIN     Date of patient request: 02/07/2024 Last office visit: 10/15/2023 Upcoming visit: Visit date not found Date of last refill: 10/18/2023 Last refill amount: 30

## 2024-02-07 NOTE — Telephone Encounter (Signed)
 FYI Only or Action Required?: FYI only for provider.  Patient was last seen in primary care on 12/23/2023 by Mahlon Comer BRAVO, MD.  Called Nurse Triage reporting Memory Loss.  Symptoms began several months ago.  Interventions attempted: Nothing.  Symptoms are: gradually worsening.  Triage Disposition: See Physician Within 24 Hours  Patient/caregiver understands and will follow disposition?: Yes Copied from CRM #[id number]. Topic: Clinical - Red Word Triage >> Feb 07, 2024  4:08 PM Dedra B wrote: [facility] that prompted transfer to Nurse Triage: Pt caregiver, Duwaine Rummer, called on behalf of pt. Pt having issues with her memory and seems nervous all the time. Warm transfer to NT. Reason for Disposition  [1] Confusion getting worse AND [2] slow onset (days to weeks)  Answer Assessment - Initial Assessment Questions 1. MAIN CONCERN OR SYMPTOM:  What is your main concern right now? What questions do you have? What's the main symptom you're worried about? (e.g., confusion, memory loss)     Family and caregiver report memory loss and anxiety; Caregiver says that patient has been very worried and concerned for her husbands health for the past 2 days ago   2. ONSET:  When did the symptom start (or worsen)? (minutes, hours, days, weeks)     Family started noticing some symptoms a few months ago getting worse over the past 3 weeks since husband has been in the hospital  3. BETTER-SAME-WORSE: Are you (the patient) getting better, staying the same, or getting worse compared to the day you (they) were diagnosed or most recent hospital discharge?     Getting worse  4. DIAGNOSIS: Was the dementia diagnosed by a doctor? If Yes, ask: When? (e.g., days, months, years ago)     Patient has not been diagnosed with dementia  5. MEDICINES: Has there been any change in medicines recently? (e.g., narcotics, antihistamines, benzodiazepines, etc.)     No med changes  6. OTHER SYMPTOMS:  Are there any other symptoms? (e.g., cough, falling, fever, pain)     Having trouble sleeping   7. SUPPORT: What type of support do you (the patient) have? Note: Document living circumstances and support (e.g., family, nursing home).     Lives with husband who is now at a facility. Family and caregiver have been stayng with her  Protocols used: Dementia Symptoms and Questions-A-AH

## 2024-02-07 NOTE — Telephone Encounter (Signed)
 Patient is seeing Dr. Jerrell tomorrow

## 2024-02-08 ENCOUNTER — Ambulatory Visit (INDEPENDENT_AMBULATORY_CARE_PROVIDER_SITE_OTHER): Admitting: Student in an Organized Health Care Education/Training Program

## 2024-02-08 ENCOUNTER — Encounter: Payer: Self-pay | Admitting: Student in an Organized Health Care Education/Training Program

## 2024-02-08 VITALS — BP 179/86 | HR 71 | Wt 133.0 lb

## 2024-02-08 DIAGNOSIS — F0390 Unspecified dementia without behavioral disturbance: Secondary | ICD-10-CM | POA: Insufficient documentation

## 2024-02-08 NOTE — Assessment & Plan Note (Signed)
 Family brought the patient in today requesting memory testing.  Based on the testing we did in the office, I think the patient has moderate to severe memory impairment is consistent with dementia, likely Alzheimer's type.  On exam she shows no signs of parkinsonism.  No history of cerebral infarction.  Long-term clonazepam  use noted, family understands the complexity of managing this medication in older people who have been on it for so long.  Family does have safety concerns at home due to potential wandering or falls.  I recommended avoid other sedating medications such as Benadryl and sleep aids. Encouraged social engagement and daily exercise.  We talked about treatment options including donepezil, we talked about reasonable expectations, family declined to add an additional medication at this time which I think is reasonable given limited expected benefit.  I recommended they consider assisted living if safety cannot be ensured at home.  Currently the patient is living by herself, but has pretty good family support.  Discuss future care planning with family and Doctor Mahlon. Report any abrupt changes in mental status for evaluation.

## 2024-02-08 NOTE — Patient Instructions (Signed)
  VISIT SUMMARY: Today, we discussed your concerns about memory loss and possible Parkinson's disease. We also addressed your hearing issues and overall safety at home.  YOUR PLAN: -DEMENTIA, LIKELY ALZHEIMER'S TYPE: Dementia is a condition that affects your memory and thinking skills. It is likely that you have Alzheimer's type dementia, which is a common form of dementia. We will continue your current medication, clonazepam , at the same dose and avoid any sedating medications like Benadryl and sleep aids. It's important to stay socially active and exercise daily. If safety at home becomes a concern, consider assisted living. Discuss future care planning with your family and Doctor Mahlon. Report any sudden changes in your mental status immediately.  -BILATERAL EAR FULLNESS AND HEARING LOSS: Chronic ear fullness and hearing loss can affect your memory and overall well-being. We recommend considering hearing aids to improve your hearing, which may also help with your memory.  INSTRUCTIONS: Please follow up with Doctor Mahlon to discuss future care planning. Report any sudden changes in your mental status for further evaluation. Consider getting hearing aids to improve your hearing and potentially aid your memory.

## 2024-02-08 NOTE — Progress Notes (Signed)
 Acute Office Visit  Subjective:     Patient ID: Rachel Walton, female    DOB: 1929-11-22, 89 y.o.   MRN: [id number]  Chief Complaint  Patient presents with   Memory Loss    Family and Care giver are concerned with demintia due to patients increased confusion and anxiety since husbands hospital stay. Has increased Kloninipin.     HPI  Discussed the use of AI scribe software for clinical note transcription with the patient, who gave verbal consent to proceed.  History of Present Illness Rachel Walton is a 89 year old female who presents with concerns of dementia and Parkinson's disease. She is accompanied by Duwaine, her family caregiver.  She experiences severe memory loss, shuffling gait, imbalance, and nervousness. Her caregiver is concerned about possible Parkinson's disease and dementia. She has a history of tremors, previously evaluated and diagnosed as essential tremors, for which she has been taking clonazepam  0.5 mg, half to one tablet daily, for a long time.  Her memory varies with her interest in the subject. She sometimes forgets where she places belongings, such as her handbag, but is not overly worried about her memory. She can recall some details like her address and the current year but struggles with others.  Her ears have been stopped up for a long time, affecting her hearing. She does not use hearing aids. She has been hoarse for about three to four months.  She lives at home with her husband, who is currently in rehab after a hip fracture. Her son and caregiver frequently assist her at home. She does not drive due to her memory issues and uses a walker to aid with her balance and prevent falls. She stays active by engaging in activities like baking.     Objective:    BP (!) 179/86   Pulse 71   Wt 133 lb (60.3 kg)   BMI 25.13 kg/m    Physical Exam  Gen: Frail-appearing older woman ENT: Minimal cerumen which is nonobstructing, tympanic membranes appear  normal Neuro: Alert, conversational, sitting in a wheelchair, full strength upper and lower extremities  Memory Testing:   MMSE - 18/30 Mini-Cog - 1/5 Clock Draw -the numbers were not spatially placed correct on the clock, hands were approximately correct      Assessment & Plan:    Problem List Items Addressed This Visit       Unprioritized   Major neurocognitive disorder (HCC) - Primary   Family brought the patient in today requesting memory testing.  Based on the testing we did in the office, I think the patient has moderate to severe memory impairment is consistent with dementia, likely Alzheimer's type.  On exam she shows no signs of parkinsonism.  No history of cerebral infarction.  Long-term clonazepam  use noted, family understands the complexity of managing this medication in older people who have been on it for so long.  Family does have safety concerns at home due to potential wandering or falls.  I recommended avoid other sedating medications such as Benadryl and sleep aids. Encouraged social engagement and daily exercise.  We talked about treatment options including donepezil, we talked about reasonable expectations, family declined to add an additional medication at this time which I think is reasonable given limited expected benefit.  I recommended they consider assisted living if safety cannot be ensured at home.  Currently the patient is living by herself, but has pretty good family support.  Discuss future care planning with family  and Doctor Mahlon. Report any abrupt changes in mental status for evaluation.       Cleatus Debby Specking, MD

## 2024-02-15 NOTE — Progress Notes (Signed)
 Pharmacy Quality Measure Review  This patient is appearing on a report for being at risk of failing the adherence measure for cholesterol (statin) medications this calendar year.   Medication: rosuvastatin  10 mg daily Last fill date: 02/07/2024 for 30 day supply  Insurance report was not up to date. No action needed at this time.  She is out of refills. Will need a new prescription sent into the pharmacy by the end of October. Will route to embedded pharmacist to facilitate.   Woodie Jock, PharmD PGY1 Pharmacy Resident  02/15/2024

## 2024-02-16 NOTE — Progress Notes (Signed)
 02/16/2024 - Patient was last seen by Dr Mahlon 12/23/2023 and next follow up is January 2026. Looks like Rx was sent to her pharmacy 02/07/2024 for #30 tablets with 3 refills - she should have 3 refills remaining.   Outpatient Orders     The patient is taking this medication long-term.     Start Date End Date Dispense Refills Pharmacy   rosuvastatin  (CRESTOR ) 10 MG tablet 02/07/2024 -- 30 tablet 3/3 [facility]   TAKE ONE (1) TABLET BY MOUTH EVERY DAY    rosuvastatin  (CRESTOR ) 10 MG tablet 08/30/2023 02/07/2024 30 tablet 3/3 [facility]   TAKE ONE (1) TABLET BY MOUTH EVERY DAY    rosuvastatin  (CRESTOR ) 10 MG tablet 05/17/2023 08/30/2023 30 tablet 3/3 [facility]   Take 1 tablet (10 mg total) by mouth daily.    Madelin Ray, PharmD Clinical Pharmacist [facility]  Population Health [phone]

## 2024-02-18 ENCOUNTER — Other Ambulatory Visit: Payer: Self-pay | Admitting: Family Medicine

## 2024-02-18 DIAGNOSIS — E876 Hypokalemia: Secondary | ICD-10-CM

## 2024-02-18 DIAGNOSIS — I10 Essential (primary) hypertension: Secondary | ICD-10-CM

## 2024-02-22 ENCOUNTER — Emergency Department (HOSPITAL_BASED_OUTPATIENT_CLINIC_OR_DEPARTMENT_OTHER)

## 2024-02-22 ENCOUNTER — Ambulatory Visit: Admitting: Student in an Organized Health Care Education/Training Program

## 2024-02-22 ENCOUNTER — Encounter: Payer: Self-pay | Admitting: Student in an Organized Health Care Education/Training Program

## 2024-02-22 ENCOUNTER — Encounter (HOSPITAL_BASED_OUTPATIENT_CLINIC_OR_DEPARTMENT_OTHER): Payer: Self-pay | Admitting: *Deleted

## 2024-02-22 ENCOUNTER — Emergency Department (HOSPITAL_BASED_OUTPATIENT_CLINIC_OR_DEPARTMENT_OTHER)
Admission: EM | Admit: 2024-02-22 | Discharge: 2024-02-22 | Disposition: A | Discharge status: Home / Self Care | Attending: Emergency Medicine | Admitting: Emergency Medicine

## 2024-02-22 ENCOUNTER — Other Ambulatory Visit: Payer: Self-pay

## 2024-02-22 VITALS — BP 163/57 | HR 64 | Temp 97.8°F | Wt 131.0 lb

## 2024-02-22 DIAGNOSIS — M8580 Other specified disorders of bone density and structure, unspecified site: Secondary | ICD-10-CM | POA: Diagnosis not present

## 2024-02-22 DIAGNOSIS — I10 Essential (primary) hypertension: Secondary | ICD-10-CM | POA: Diagnosis not present

## 2024-02-22 DIAGNOSIS — B9689 Other specified bacterial agents as the cause of diseases classified elsewhere: Secondary | ICD-10-CM | POA: Diagnosis not present

## 2024-02-22 DIAGNOSIS — N390 Urinary tract infection, site not specified: Secondary | ICD-10-CM | POA: Diagnosis not present

## 2024-02-22 DIAGNOSIS — R454 Irritability and anger: Secondary | ICD-10-CM | POA: Insufficient documentation

## 2024-02-22 DIAGNOSIS — R410 Disorientation, unspecified: Secondary | ICD-10-CM | POA: Insufficient documentation

## 2024-02-22 DIAGNOSIS — R519 Headache, unspecified: Secondary | ICD-10-CM | POA: Insufficient documentation

## 2024-02-22 DIAGNOSIS — F0390 Unspecified dementia without behavioral disturbance: Secondary | ICD-10-CM | POA: Diagnosis not present

## 2024-02-22 DIAGNOSIS — R451 Restlessness and agitation: Secondary | ICD-10-CM | POA: Diagnosis not present

## 2024-02-22 DIAGNOSIS — K625 Hemorrhage of anus and rectum: Secondary | ICD-10-CM | POA: Insufficient documentation

## 2024-02-22 DIAGNOSIS — Z79899 Other long term (current) drug therapy: Secondary | ICD-10-CM | POA: Insufficient documentation

## 2024-02-22 DIAGNOSIS — Y92810 Car as the place of occurrence of the external cause: Secondary | ICD-10-CM | POA: Insufficient documentation

## 2024-02-22 DIAGNOSIS — S0990XA Unspecified injury of head, initial encounter: Secondary | ICD-10-CM

## 2024-02-22 DIAGNOSIS — D72819 Decreased white blood cell count, unspecified: Secondary | ICD-10-CM | POA: Diagnosis not present

## 2024-02-22 DIAGNOSIS — W228XXA Striking against or struck by other objects, initial encounter: Secondary | ICD-10-CM | POA: Insufficient documentation

## 2024-02-22 DIAGNOSIS — Z85828 Personal history of other malignant neoplasm of skin: Secondary | ICD-10-CM | POA: Insufficient documentation

## 2024-02-22 DIAGNOSIS — R531 Weakness: Secondary | ICD-10-CM | POA: Diagnosis not present

## 2024-02-22 LAB — COMPREHENSIVE METABOLIC PANEL WITH GFR
ALT: 16 U/L (ref 0–35)
ALT: 15 U/L (ref 0–44)
AST: 18 U/L (ref 0–37)
AST: 27 U/L (ref 15–41)
Albumin: 3.8 g/dL (ref 3.5–5.2)
Albumin: 4 g/dL (ref 3.5–5.0)
Alkaline Phosphatase: 48 U/L (ref 39–117)
Alkaline Phosphatase: 63 U/L (ref 38–126)
Anion gap: 8 (ref 5–15)
BUN: 16 mg/dL (ref 6–23)
BUN: 15 mg/dL (ref 8–23)
CO2: 29 meq/L (ref 19–32)
CO2: 27 mmol/L (ref 22–32)
Calcium: 8.6 mg/dL (ref 8.4–10.5)
Calcium: 9 mg/dL (ref 8.9–10.3)
Chloride: 106 meq/L (ref 96–112)
Chloride: 106 mmol/L (ref 98–111)
Creatinine, Ser: 0.99 mg/dL (ref 0.40–1.20)
Creatinine, Ser: 1.12 mg/dL — ABNORMAL HIGH (ref 0.44–1.00)
GFR, Estimated: 46 mL/min — ABNORMAL LOW (ref >60)
GFR: 49.62 mL/min — ABNORMAL LOW (ref >60.00)
Glucose, Bld: 92 mg/dL (ref 70–99)
Glucose, Bld: 92 mg/dL (ref 70–99)
Potassium: 4 meq/L (ref 3.5–5.1)
Potassium: 4.3 mmol/L (ref 3.5–5.1)
Sodium: 141 meq/L (ref 135–145)
Sodium: 141 mmol/L (ref 135–145)
Total Bilirubin: 0.4 mg/dL (ref 0.2–1.2)
Total Bilirubin: 0.3 mg/dL (ref 0.0–1.2)
Total Protein: 6.1 g/dL (ref 6.0–8.3)
Total Protein: 6.4 g/dL — ABNORMAL LOW (ref 6.5–8.1)

## 2024-02-22 LAB — URINALYSIS, ROUTINE W REFLEX MICROSCOPIC
Bilirubin Urine: NEGATIVE
Glucose, UA: NEGATIVE mg/dL
Ketones, ur: NEGATIVE mg/dL
Nitrite: NEGATIVE
Protein, ur: NEGATIVE mg/dL
Specific Gravity, Urine: 1.017 (ref 1.005–1.030)
WBC, UA: >50 WBC/hpf (ref 0–5)
pH: 5.5 (ref 5.0–8.0)

## 2024-02-22 LAB — CBC
HCT: 34.9 % — ABNORMAL LOW (ref 36.0–46.0)
HCT: 36.2 % (ref 36.0–46.0)
Hemoglobin: 11.6 g/dL — ABNORMAL LOW (ref 12.0–15.0)
Hemoglobin: 12 g/dL (ref 12.0–15.0)
MCH: 30.2 pg (ref 26.0–34.0)
MCHC: 33.3 g/dL (ref 30.0–36.0)
MCHC: 33.1 g/dL (ref 30.0–36.0)
MCV: 88.7 fl (ref 78.0–100.0)
MCV: 91.2 fL (ref 80.0–100.0)
Platelets: 194 K/uL (ref 150.0–400.0)
Platelets: 213 K/uL (ref 150–400)
RBC: 3.94 Mil/uL (ref 3.87–5.11)
RBC: 3.97 MIL/uL (ref 3.87–5.11)
RDW: 14.2 % (ref 11.5–15.5)
RDW: 13.7 % (ref 11.5–15.5)
WBC: 4.9 K/uL (ref 4.0–10.5)
WBC: 5.8 K/uL (ref 4.0–10.5)
nRBC: 0 % (ref 0.0–0.2)

## 2024-02-22 LAB — MAGNESIUM: Magnesium: 2.1 mg/dL (ref 1.5–2.5)

## 2024-02-22 MED ORDER — FOSFOMYCIN TROMETHAMINE 3 G PO PACK: 3.0000 g | PACK | Freq: Once | ORAL | 0 refills | Status: AC

## 2024-02-22 MED ORDER — FOSFOMYCIN TROMETHAMINE 3 G PO PACK: 3.0000 g | PACK | Freq: Once | ORAL | Status: DC

## 2024-02-22 MED ORDER — DONEPEZIL HCL 5 MG PO TABS: 5.0000 mg | ORAL_TABLET | Freq: Every day | ORAL | 1 refills | Status: DC

## 2024-02-22 MED ORDER — LORAZEPAM 2 MG/ML IJ SOLN
0.5000 mg | Freq: Once | INTRAMUSCULAR | Status: AC
  Administered 2024-02-22: 0.5 mg via INTRAVENOUS
  Filled 2024-02-22: qty 1
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
## 2024-02-22 NOTE — ED Notes (Addendum)
 Pt getting increasingly agitated and wanting to leave. Family wanting to leave as well. Encouraged to wait for the doctor before leaving. IV removed. Pt and family seen leaving the ED. MD notified.
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
## 2024-02-22 NOTE — ED Notes (Signed)
 Pt left without discharge paperwork. Spoke with daughter on the phone to notify them about prescriptions to pick up. MD contacted the family as well.
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
## 2024-02-22 NOTE — ED Triage Notes (Signed)
 Pt is brought in by her daughter who is her caregiver. Pt has been having generalized weakness, fatigue and some confusion (hx of dementia) since about 10 days.  Pt was seen at her PCP and her urine was wnl, no UTI.  She had blood work done.  Pt is here now due to having a BM pta which daughter is concerned might be bloody.  Pt is hypertensive in triage and daughter attributes this to having had to drop her spouse off at nursing home pta.

## 2024-02-23 ENCOUNTER — Ambulatory Visit: Payer: Self-pay | Admitting: Student in an Organized Health Care Education/Training Program

## 2024-02-23 LAB — URINE CULTURE

## 2024-02-28 ENCOUNTER — Other Ambulatory Visit: Payer: Self-pay | Admitting: Family Medicine

## 2024-02-28 DIAGNOSIS — Z1231 Encounter for screening mammogram for malignant neoplasm of breast: Secondary | ICD-10-CM

## 2024-03-01 ENCOUNTER — Other Ambulatory Visit: Payer: Self-pay

## 2024-03-01 NOTE — Progress Notes (Signed)
 Pharmacy Quality Measure Review  This patient is appearing on a report for being at risk of failing the adherence measure for hypertension (ACEi/ARB) medications this calendar year.   Medication: lisinopril  20 mg Last fill date: 02/21/24 for 90 day supply  Insurance report was not up to date. No action needed at this time.   Jenkins Graces, PharmD PGY1 Pharmacy Resident [phone]

## 2024-03-06 ENCOUNTER — Other Ambulatory Visit: Payer: Self-pay | Admitting: Internal Medicine

## 2024-03-23 ENCOUNTER — Ambulatory Visit
Admission: RE | Admit: 2024-03-23 | Discharge: 2024-03-23 | Disposition: A | Source: Ambulatory Visit | Attending: Family Medicine | Admitting: Family Medicine

## 2024-03-23 DIAGNOSIS — Z1231 Encounter for screening mammogram for malignant neoplasm of breast: Secondary | ICD-10-CM

## 2024-03-26 ENCOUNTER — Other Ambulatory Visit: Payer: Self-pay | Admitting: Pharmacist

## 2024-03-26 NOTE — Progress Notes (Signed)
 Pharmacy Quality Measure Review  This patient is appearing on a report for being at risk of failing the adherence measure for cholesterol (statin) medications this calendar year.   Medication: rosuvastatin  10 mg Last fill date: 03/06/24 for 30 day supply  Insurance report was not up to date. No action needed at this time.    Jenkins Graces, PharmD PGY1 Pharmacy Resident [phone]

## 2024-03-27 ENCOUNTER — Ambulatory Visit: Payer: Self-pay

## 2024-03-27 ENCOUNTER — Telehealth: Payer: Self-pay

## 2024-03-27 DIAGNOSIS — H40033 Anatomical narrow angle, bilateral: Secondary | ICD-10-CM | POA: Diagnosis not present

## 2024-03-27 DIAGNOSIS — H43393 Other vitreous opacities, bilateral: Secondary | ICD-10-CM | POA: Diagnosis not present

## 2024-03-27 MED ORDER — ROSUVASTATIN CALCIUM 10 MG PO TABS: 10.0000 mg | ORAL_TABLET | Freq: Every day | ORAL | 0 refills | Status: DC

## 2024-03-27 NOTE — Telephone Encounter (Signed)
 I do not see where patient needs to have labs drawn. Patient was told to follow up with Dr. Mahlon and follow up with neurologist to discuss the possibility of tapering off clonazepam  and starting donepezil. Please let patient know the above, thank you    Copied from CRM #[id number]. Topic: General - Other >> Mar 27, 2024  1:31 PM Rachel Walton wrote: Reason for CRM: Patient called to schedule lab work, not showing and orders for blood work. Call dropped while on hold. >> Mar 27, 2024  3:03 PM Rachel Walton wrote: Pt calling back in regards to labs , there are no lab orders, please put lab order in so we can schedule.

## 2024-03-27 NOTE — Telephone Encounter (Signed)
  FYI Only or Action Required?: FYI only for provider: appointment scheduled on 03/29/2024.  Patient was last seen in primary care on 02/22/2024 by Jerrell Cleatus Ned, MD.  Called Nurse Triage reporting Fatigue and Depression.  Symptoms began a week ago.  Interventions attempted: Nothing.  Symptoms are: gradually worsening.  Triage Disposition: See Physician Within 24 Hours  Patient/caregiver understands and will follow disposition?: Yes Copied from CRM #[id number]. Topic: Clinical - Red Word Triage >> Mar 27, 2024  4:47 PM Ashley R wrote: Red Word that prompted transfer to Nurse Triage:  Symptoms: Very fatigued, tightness in chest (panic/anxiety), eating/drinking, depression ( spouse just past away), anemia.  Pt not feeling well - called in to request sodium, anemia, regular blood panel . Had to stop donepezil because it was making her worse. Reason for Disposition  [1] Depression AND [2] getting worse (e.g., sleeping poorly, less able to do activities of daily living)  Answer Assessment - Initial Assessment Questions 1. CONCERN: What happened that made you call today?     Daughter of patient is caller 2. DEPRESSION SYMPTOM SCREENING: How are you feeling overall? (e.g., decreased energy, increased sleeping or difficulty sleeping, difficulty concentrating, feelings of sadness, guilt, hopelessness, or worthlessness)     Fatigue, decreased energy, cries all the time.  Recent loss of husband of 72 years 3. RISK OF HARM - SUICIDAL IDEATION:  Do you ever have thoughts of hurting or killing yourself?  (e.g., yes, no, no but preoccupation with thoughts about death)     Per daughter, no risk of self harm  5. FUNCTIONAL IMPAIRMENT: How have things been going for you overall? Have you had more difficulty than usual doing your normal daily activities?  (e.g., better, same, worse; self-care, school, work, interactions)     No functional impairment other than fatigue, sleeps often.  Does  not feel like herself  Protocols used: Depression-A-AH

## 2024-03-27 NOTE — Telephone Encounter (Signed)
 Pt already triaged.   Copied from CRM #[id number]. Topic: Clinical - Red Word Triage >> Mar 27, 2024  3:04 PM Frederich PARAS wrote: [facility] that prompted transfer to Nurse Triage: extreme fatigue Rachel Walton daughter , Rachel Walton to schedule labs but orders in . Husband just passed away, they were together awhile., she is anemic, extreme fatigue. She is eating, just very tired and extreme fatigue

## 2024-03-28 NOTE — Telephone Encounter (Signed)
 Called patient to see how she is doing today. We can certainly see her tomorrow if she is okay with seeing Dr. Levora. Left vm to return call

## 2024-03-28 NOTE — Telephone Encounter (Signed)
 Please see other phone note

## 2024-03-28 NOTE — Telephone Encounter (Signed)
 Patients husband recently passed and she is having a hard time with grief. Patient has appt with Dr. Levora tomorrow. Would you like patient to be seen sooner?

## 2024-03-28 NOTE — Telephone Encounter (Signed)
 Caregiver Megan states depression and anxiety has increased . Pt is okay with seeing Dr.Greene

## 2024-03-28 NOTE — Telephone Encounter (Signed)
 I am so sorry to hear this.  I am happy to see her tomorrow and can discuss resources or treatment during this difficult time.  I do see that Dr. Mahlon has some same-day appointments available on Thursday if she would prefer to see her, and I will copy her on this message, but again I am happy to see Ms. Kulakowski tomorrow if needed.

## 2024-03-28 NOTE — Telephone Encounter (Signed)
 Dr.Tabori does not have any opening on her schedule when I checked. Typically she is a 40 min appt. Would you like me to move this pt to later date with Dr.Tabori ?

## 2024-03-28 NOTE — Telephone Encounter (Signed)
 I am happy to see pt in the first available appt I have (can be 20 minutes), but if she wants to be seen sooner than what I have, she will need to see someone else.  It is completely up to the pt at this time and we will do our best to help in whatever way

## 2024-03-29 ENCOUNTER — Ambulatory Visit: Admitting: Family Medicine

## 2024-03-29 VITALS — BP 120/62 | HR 81 | Temp 97.7°F | Ht 61.0 in | Wt 131.0 lb

## 2024-03-29 DIAGNOSIS — R6889 Other general symptoms and signs: Secondary | ICD-10-CM | POA: Diagnosis not present

## 2024-03-29 DIAGNOSIS — R233 Spontaneous ecchymoses: Secondary | ICD-10-CM | POA: Diagnosis not present

## 2024-03-29 DIAGNOSIS — R5383 Other fatigue: Secondary | ICD-10-CM | POA: Diagnosis not present

## 2024-03-29 DIAGNOSIS — F4320 Adjustment disorder, unspecified: Secondary | ICD-10-CM | POA: Diagnosis not present

## 2024-03-29 DIAGNOSIS — F32A Depression, unspecified: Secondary | ICD-10-CM | POA: Diagnosis not present

## 2024-03-29 DIAGNOSIS — G4700 Insomnia, unspecified: Secondary | ICD-10-CM | POA: Diagnosis not present

## 2024-03-29 MED ORDER — TRAZODONE HCL 50 MG PO TABS: 25.0000 mg | ORAL_TABLET | Freq: Every evening | ORAL | 1 refills | Status: DC | PRN

## 2024-03-29 NOTE — Patient Instructions (Addendum)
 I am very sorry to hear about your loss.  Continue to allow those around you to support you during this difficult time including your family and church family.  Since then clonazepam  at nighttime made symptoms worse, I think it would be reasonable to try a different medication and I have sent in a low-dose of trazodone  which can help with sleep and depression.  Start with 1/2 pill at bedtime.  If that is tolerated and no side effects but not effective for sleep, can increase to 1 pill at bedtime.  If any new side effects or difficulty tolerating that medication let us  know and we can look at other options.  I am checking some labs for the fatigue and other concerns but please follow-up with Dr. Mahlon in the next 2 weeks.  We are happy to see you sooner if needed but I do think that clonazepam  at night will be helpful.  Hang in there.  Managing Loss, Adult People experience loss in many different ways throughout their lives. Events such as moving, changing jobs, and losing friends can create a sense of loss. The loss may be as serious as a major health change, divorce, death of a pet, or death of a loved one. All of these types of loss are likely to create a physical and emotional reaction known as grief. Grief is the result of a major change or an absence of something or someone that you count on. Grief is a normal reaction to loss. A variety of factors can affect your grieving experience, including: The nature of your loss. Your relationship to what or whom you lost. Your understanding of grief and how to manage it. Your support system. Be aware that when grief becomes extreme, it can lead to more severe issues like isolation, depression, anxiety, or suicidal thoughts. Talk with your health care provider if you have any of these issues. How to manage lifestyle changes Keep to your normal routine as much as possible. If you have trouble focusing or doing normal activities, it is acceptable to take some  time away from your normal routine. Spend time with friends and loved ones. Eat a healthy diet, get plenty of sleep, and rest when you feel tired. How to recognize changes  The way that you deal with your grief will affect your ability to function as you normally do. When grieving, you may experience these changes: Numbness, shock, sadness, anxiety, anger, denial, and guilt. Thoughts about death. Unexpected crying. A physical sensation of emptiness in your stomach. Problems sleeping and eating. Tiredness (fatigue). Loss of interest in normal activities. Dreaming about or imagining seeing the person who died. A need to remember what or whom you lost. Difficulty thinking about anything other than your loss for a period of time. Relief. If you have been expecting the loss for a while, you may feel a sense of relief when it happens. Follow these instructions at home: Activity Express your feelings in healthy ways, such as: Talking with others about your loss. It may be helpful to find others who have had a similar loss, such as a support group. Writing down your feelings in a journal. Doing physical activities to release stress and emotional energy. Doing creative activities like painting, sculpting, or playing or listening to music. Practicing resilience. This is the ability to recover and adjust after facing challenges. Reading some resources that encourage resilience may help you to learn ways to practice those behaviors.  General instructions Be patient with yourself  and others. Allow the grieving process to happen, and remember that grieving takes time. It is likely that you may never feel completely done with some grief. You may find a way to move on while still cherishing memories and feelings about your loss. Accepting your loss is a process. It can take months or longer to adjust. Keep all follow-up visits. This is important. Where to find support To get support for managing  loss: Ask your health care provider for help and recommendations, such as grief counseling or therapy. Think about joining a support group for people who are managing a loss. Where to find more information You can find more information about managing loss from: American Society of Clinical Oncology: www.cancer.net American Psychological Association: dicetournament.ca Contact a health care provider if: Your grief is extreme and keeps getting worse. You have ongoing grief that does not improve. Your body shows symptoms of grief, such as illness. You feel depressed, anxious, or hopeless. Get help right away if: You have thoughts about hurting yourself or others. Get help right away if you feel like you may hurt yourself or others, or have thoughts about taking your own life. Go to your nearest emergency room or: Call 911. Call the National Suicide Prevention Lifeline at (310) 592-0404 or 988. This is open 24 hours a day. Text the Crisis Text Line at 224-623-5054. Summary Grief is the result of a major change or an absence of someone or something that you count on. Grief is a normal reaction to loss. The depth of grief and the period of recovery depend on the type of loss and your ability to adjust to the change and process your feelings. Processing grief requires patience and a willingness to accept your feelings and talk about your loss with people who are supportive. It is important to find resources that work for you and to realize that people experience grief differently. There is not one grieving process that works for everyone in the same way. Be aware that when grief becomes extreme, it can lead to more severe issues like isolation, depression, anxiety, or suicidal thoughts. Talk with your health care provider if you have any of these issues. This information is not intended to replace advice given to you by your health care provider. Make sure you discuss any questions you have with your health care  provider. Document Revised: 12/16/2020 Document Reviewed: 12/16/2020 Elsevier Patient Education  2024 [employer].

## 2024-03-29 NOTE — Progress Notes (Signed)
 Subjective:  Patient ID: Rachel Walton, female    DOB: 1929/08/17  Age: 89 y.o. MRN: [id number]  CC:  Chief Complaint  Patient presents with   Depression    Fatigue, pt has not been sleeping well, pt lost her husband recently and has been dealing with depression.      HPI [name] presents for  Acute visit for above.  PCP is Dr. Mahlon.  Chart reviewed.  She was recently seen 1 month ago by Dr. Jerrell.  History of major neurocognitive disorder, noted increasing confusion at nighttime and early morning at that visit exam was reassuring at that time without signs of acute illness.  Thought to have possible progression of underlying neurocognitive disorder.  Recommended avoidance of CNS depressant medications at that time, had been on clonazepam  and there was a plan to discuss tapering that with her neurology team and PCP.  She was started on donepezil , question of trial of trazodone  in the evening if issues with sundowning.  Reorientation with her support system discussed.  She was seen later that day in the ER.  CT head without acute intracranial abnormality.  CBC normal.  CMP with creatinine 1.12, total protein 6.4, eGFR 46, otherwise normal.  Urine culture eventually indicated multiple species, but initial urinalysis with rare bacteria, greater than 50 WBC.  She was treated with fosfomycin for UTI.  Nurse triage note reviewed from November 17.  Unfortunately her spouse of 72 years recently passed 2 weeks ago Scheduled to see me acutely due to grief and difficulty with fatigue, depression symptoms.  She presents with her daughter Duwaine today who provides some history. She notes that Vinessa is more depressed, anxious, not sleeping well.  Trouble getting to sleep and staying asleep. Eating and drinking ok. Fatigue during the day with some naps during the day. Still some sundowning, notes more if taking nap during day.  Felt worse on donepezil  - calling confused in middle of night. Stopped  after a few weeks. Less confused off donepezil .  Clonazepam  0.5mg  1/4 tab in the morning - worse tremors off that med. Living with dtr and son.  Good support system. Involved with church  No fever.  Some diarrhea with being upset. Orange stools? Cold natured.  Fatigue.  Easy bruising recently - past few weeks.   No SI.   At end of visit - Duwaine noted that she had tried to give [name] 1/4 pill of clonazepam  at night but that seemed to make symptoms worse - more confused, agitation. She is requesting med like SSRI for depression.      12/23/2023    1:34 PM 08/23/2023   11:43 AM 05/14/2023   12:43 PM 12/23/2022    2:41 PM 11/23/2022    2:42 PM  Depression screen PHQ 2/9  Decreased Interest 1 0 0 1 0  Down, Depressed, Hopeless 1 1 1  0 0  PHQ - 2 Score 2 1 1 1  0  Altered sleeping 1  0 1 0  Tired, decreased energy 2  1 1 1   Change in appetite 1  0 0 0  Feeling bad or failure about yourself  1  0 0 0  Trouble concentrating 1  0 0 0  Moving slowly or fidgety/restless 0  0 0 0  Suicidal thoughts 0  0 0 0  PHQ-9 Score 8   2  3  1    Difficult doing work/chores Somewhat difficult   Not difficult at all Not difficult at  all     Data saved with a previous flowsheet row definition     History Patient Active Problem List   Diagnosis Date Noted   Head injury 02/22/2024   Major neurocognitive disorder (HCC) 02/08/2024   Nocturia 01/09/2024   Fatigue 01/09/2024   Arthritis of left hip 10/15/2023   Hoarseness 09/21/2023   Ear fullness, bilateral 09/21/2023   Physical exam 05/14/2023   Memory difficulties 01/18/2021   Anxiety 01/18/2021   Vitamin D  deficiency 03/06/2020   Osteopenia with high risk of fracture 10/21/2016   Essential tremor 05/20/2016   Age-related macular degeneration, dry, both eyes 04/07/2016   Barrett's esophagus 12/20/2015   Hypokalemia 10/09/2015   Blepharoptosis, acquired, 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   Seasonal allergies 12/11/2013   Benign essential HTN 12/11/2013    Amblyopia ex anopsia of right eye 12/07/2013   Degenerative drusen 05/05/2013   Mixed hyperlipidemia 11/21/2012   Colon polyp    Kyphosis    Atrophic vaginitis    Diverticulosis    Past Medical History:  Diagnosis Date   Acute gastric ulcer    Allergy    Anxiety    Atrophic vaginitis    Barrett's esophagus    Basal cell carcinoma of nose     under nose   Cataract    Depression    Diverticulosis    Esophageal stricture    Esophageal ulcer    Fatty liver    Gall stones 06/15/2013   GERD (gastroesophageal reflux disease)    Heart murmur    Hiatal hernia    Hx of adenomatous colonic polyps    Hyperlipidemia    Hypertension    Hypokalemia    IBS (irritable bowel syndrome)    Kyphosis    Lymphocytic colitis    Macular degeneration    ?wet or dry; ? 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    Tremor    Vitamin D  deficiency    Past Surgical History:  Procedure Laterality Date   ABDOMINAL HYSTERECTOMY  1980   APPENDECTOMY     BACK SURGERY     BREAST BIOPSY Left    CARPAL TUNNEL RELEASE Left 09/13/2020   Procedure: CARPAL TUNNEL RELEASE;  Surgeon: Camella Fallow, MD;  Location: [facility] OR;  Service: Orthopedics;  Laterality: Left;   CATARACT EXTRACTION W/ INTRAOCULAR LENS  IMPLANT, BILATERAL Bilateral    COLONOSCOPY     ESOPHAGOGASTRODUODENOSCOPY (EGD) WITH ESOPHAGEAL DILATION  X 1   EYE SURGERY     LUMBAR DISC SURGERY     fragmented disc   ORIF RADIAL FRACTURE Left 09/13/2020   Procedure: HARDWARE REMOVAL, OPEN REDUCTION INTERNAL FIXATION LEFT RADIUS;  Surgeon: Camella Fallow, MD;  Location: [facility] OR;  Service: Orthopedics;  Laterality: Left;  Block with IV sedation   POLYPECTOMY     SQUAMOUS CELL CARCINOMA EXCISION     nose tip   Allergies  Allergen Reactions   Alendronate Sodium Other (See Comments)    Chest pain    Lexapro  [Escitalopram  Oxalate] Other (See Comments)    Makes lymphangitic colitis worse   Amoxicillin  Other (See Comments)    diarrhea   Aspirin  Other (See Comments)    Per the patient, made the gastric ulcer worse.   Budesonide  Other (See Comments)    Blurry vision, shaking inside her body  Clindamycin /Lincomycin Other (See Comments)    unknown   Keflex [Cephalexin] Other (See Comments)    unknown   Levofloxacin  Other (See Comments)    Insomnia and headache   Prednisone  Other (See Comments)    Nervousness   Sudafed [Pseudoephedrine Hcl] Other (See Comments)    insomnia   Sulfa Antibiotics Hives   Toprol Xl [Metoprolol Succinate] Other (See Comments)    Unknown reaction   Trimethoprim     Unknown reaction   Zithromax  [Azithromycin ] Rash   Prior to Admission medications   Medication Sig Start Date End Date Taking? Authorizing Provider  acetaminophen  (TYLENOL ) 500 MG tablet Take 500 mg by mouth every 6 (six) hours as needed for mild pain.   Yes [provider]  clonazePAM  (KLONOPIN ) 0.5 MG tablet TAKE 1/2 TO 1 TABLET BY MOUTH DAILY AS NEEDED 01/26/24  Yes Tabori, Katherine E, MD  donepezil  (ARICEPT ) 5 MG tablet Take 1 tablet (5 mg total) by mouth at bedtime. 02/22/24  Yes Jerrell Cleatus Ned, MD  famotidine  (PEPCID ) 20 MG tablet Take 20 mg by mouth 2 (two) times daily.   Yes Tabori, Katherine E, MD  fluticasone  (FLONASE ) 50 MCG/ACT nasal spray Place 2 sprays into both nostrils daily. 12/12/21  Yes Gladis, Mary-Margaret, FNP  lisinopril  (ZESTRIL ) 20 MG tablet TAKE ONE (1) TABLET BY MOUTH EVERY DAY 02/18/24  Yes Tabori, Katherine E, MD  loratadine  (CLARITIN ) 10 MG tablet Take 1 tablet (10 mg total) by mouth daily. 05/14/23  Yes Tabori, Katherine E, MD  meloxicam  (MOBIC ) 7.5 MG tablet TAKE 1 TABLET BY MOUTH DAILY AS NEEDED FOR PAIN 02/08/24  Yes Tabori, Katherine E, MD  mesalamine  (LIALDA ) 1.2 g EC tablet Take 1 tablet (1.2 g total) by mouth 2 (two) times daily. 04/27/23  Yes  Tabori, Katherine E, MD  Multiple Vitamins-Minerals (PRESERVISION AREDS PO) Take by mouth.   Yes [provider]  naphazoline-pheniramine (NAPHCON-A) 0.025-0.3 % ophthalmic solution Place 1 drop into both eyes as needed. 05/05/13  Yes [provider]  Omega-3 Fatty Acids  (FISH OIL) 1200 MG CPDR Take by mouth.   Yes [provider]  omeprazole  (PRILOSEC) 40 MG capsule Take 1 capsule (40 mg total) by mouth daily. NEEDS APPT FOR FURTHER REFILLS 02/07/24  Yes Pyrtle, Gordy HERO, MD  pantoprazole  (PROTONIX ) 40 MG tablet TAKE ONE (1) TABLET BY MOUTH EVERY DAY 10/14/23  Yes Tabori, Katherine E, MD  potassium chloride  (KLOR-CON ) 10 MEQ tablet TAKE ONE (1) TABLET BY MOUTH EVERY DAY 02/18/24  Yes Mahlon Comer BRAVO, MD  rosuvastatin  (CRESTOR ) 10 MG tablet Take 1 tablet (10 mg total) by mouth daily. 03/27/24  Yes Tabori, Katherine E, MD  azelastine  (ASTELIN ) 0.1 % nasal spray Place 1 spray into both nostrils 2 (two) times daily. Use in each nostril as directed Patient not taking: Reported on 03/29/2024 11/23/22   Mahlon Comer BRAVO, MD   Social History   Socioeconomic History   Marital status: Married    Spouse name: Elsie   Number of children: 3   Years of education: Not on file   Highest education level: Not on file  Occupational History   Occupation: Retired  Tobacco Use   Smoking status: Never   Smokeless tobacco: Never  Vaping Use   Vaping status: Never Used  Substance and Sexual Activity   Alcohol use: No    Alcohol/week: 0.0 standard drinks of alcohol   Drug use: No   Sexual activity: Not Currently  Other Topics Concern   Not on  file  Social History Narrative   One son in [place]   One son, Matilda lives nearby and visits almost daily   Social Drivers of Health   Financial Resource Strain: Low Risk  (09/22/2023)   Overall Financial Resource Strain (CARDIA)    Difficulty of Paying Living Expenses: Not very hard  Food Insecurity: Unknown (10/27/2023)    Hunger Vital Sign    Worried About Running Out of Food in the Last Year: Never true    Ran Out of Food in the Last Year: Not on file  Transportation Needs: No Transportation Needs (10/27/2023)   PRAPARE - Administrator, Civil Service (Medical): No    Lack of Transportation (Non-Medical): No  Physical Activity: Inactive (12/23/2022)   Exercise Vital Sign    Days of Exercise per Week: 0 days    Minutes of Exercise per Session: 0 min  Stress: No Stress Concern Present (12/23/2022)   [employer] of Occupational Health - Occupational Stress Questionnaire    Feeling of Stress : Only a little  Social Connections: Moderately Isolated (12/23/2022)   Social Connection and Isolation Panel    Frequency of Communication with Friends and Family: Twice a week    Frequency of Social Gatherings with Friends and Family: Once a week    Attends Religious Services: Never    Database Administrator or Organizations: No    Attends Banker Meetings: Never    Marital Status: Married  Catering Manager Violence: Not At Risk (10/27/2023)   Humiliation, Afraid, Rape, and Kick questionnaire    Fear of Current or Ex-Partner: No    Emotionally Abused: No    Physically Abused: No    Sexually Abused: No    Review of Systems  Per HPI.  Objective:   Vitals:   03/29/24 1404  BP: 120/62  Pulse: 81  Temp: 97.7 F (36.5 C)  TempSrc: Temporal  SpO2: 98%  Weight: 131 lb (59.4 kg)  Height: 5' 1 (1.549 m)     Physical Exam Vitals reviewed.  Constitutional:      Appearance: Normal appearance. She is well-developed.     Comments: Slight tremor.  Tearful during exam.  Some distraction, tangentiality during discussion but interacting appropriately otherwise.  HENT:     Head: Normocephalic and atraumatic.  Eyes:     Conjunctiva/sclera: Conjunctivae normal.     Pupils: Pupils are equal, round, and reactive to light.  Neck:     Vascular: No carotid bruit.  Cardiovascular:     Rate  and Rhythm: Normal rate and regular rhythm.     Heart sounds: Murmur (2/6 SEM) heard.  Pulmonary:     Breath sounds: Normal breath sounds.  Abdominal:     Palpations: Abdomen is soft. There is no pulsatile mass.     Tenderness: There is no abdominal tenderness.  Musculoskeletal:     Right lower leg: No edema.     Left lower leg: No edema.  Skin:    General: Skin is warm and dry.  Neurological:     Mental Status: She is alert and oriented to person, place, and time.  Psychiatric:        Mood and Affect: Mood is depressed.    I personally spent a total of 54 minutes in the care of the patient today including preparing to see the patient, getting/reviewing separately obtained history, performing a medically appropriate exam/evaluation, counseling and educating, placing orders, and referring and communicating with other health care professionals -  discussed with her PCP, and time documenting in the medical record.   Assessment & Plan:  MANUELITA MOXON is a 89 y.o. female . Grief reaction - Plan: traZODone  (DESYREL ) 50 MG tablet  Insomnia, unspecified type - Plan: traZODone  (DESYREL ) 50 MG tablet  Fatigue, unspecified type - Plan: Basic metabolic panel with GFR, TSH  Cold intolerance - Plan: TSH  Easy bruising - Plan: CBC, Basic metabolic panel with GFR  Depression, unspecified depression type - Plan: traZODone  (DESYREL ) 50 MG tablet   Very unfortunate situation with understandable worsening of depression symptoms, grief after passing of spouse as above.  However treatment of such is complicated by her memory history, and side effects with meds as above.  On further discussion, has not tolerated evening dosing of clonazepam .  Given this information we can try a very low-dose of trazodone  with potential risks and side effects discussed with patient, daughter.  Close monitoring and update on symptoms.  25 to 50 mg dosing options discussed with RTC precautions.   With additional concern  of fatigue, cold intolerance and easy bruising, will check CBC, TSH, BMP and follow-up with PCP depending on those results.  RTC precautions discussed including if any persistent diarrhea or other new symptoms.   Meds ordered this encounter  Medications   traZODone  (DESYREL ) 50 MG tablet    Sig: Take 0.5-1 tablets (25-50 mg total) by mouth at bedtime as needed for sleep.    Dispense:  30 tablet    Refill:  1   Patient Instructions  I am very sorry to hear about your loss.  Continue to allow those around you to support you during this difficult time including your family and church family.  Since then clonazepam  at nighttime made symptoms worse, I think it would be reasonable to try a different medication and I have sent in a low-dose of trazodone  which can help with sleep and depression.  Start with 1/2 pill at bedtime.  If that is tolerated and no side effects but not effective for sleep, can increase to 1 pill at bedtime.  If any new side effects or difficulty tolerating that medication let us  know and we can look at other options.  I am checking some labs for the fatigue and other concerns but please follow-up with Dr. Mahlon in the next 2 weeks.  We are happy to see you sooner if needed but I do think that clonazepam  at night will be helpful.  Hang in there.  Managing Loss, Adult People experience loss in many different ways throughout their lives. Events such as moving, changing jobs, and losing friends can create a sense of loss. The loss may be as serious as a major health change, divorce, death of a pet, or death of a loved one. All of these types of loss are likely to create a physical and emotional reaction known as grief. Grief is the result of a major change or an absence of something or someone that you count on. Grief is a normal reaction to loss. A variety of factors can affect your grieving experience, including: The nature of your loss. Your relationship to what or whom you  lost. Your understanding of grief and how to manage it. Your support system. Be aware that when grief becomes extreme, it can lead to more severe issues like isolation, depression, anxiety, or suicidal thoughts. Talk with your health care provider if you have any of these issues. How to manage lifestyle changes Keep to your normal  routine as much as possible. If you have trouble focusing or doing normal activities, it is acceptable to take some time away from your normal routine. Spend time with friends and loved ones. Eat a healthy diet, get plenty of sleep, and rest when you feel tired. How to recognize changes  The way that you deal with your grief will affect your ability to function as you normally do. When grieving, you may experience these changes: Numbness, shock, sadness, anxiety, anger, denial, and guilt. Thoughts about death. Unexpected crying. A physical sensation of emptiness in your stomach. Problems sleeping and eating. Tiredness (fatigue). Loss of interest in normal activities. Dreaming about or imagining seeing the person who died. A need to remember what or whom you lost. Difficulty thinking about anything other than your loss for a period of time. Relief. If you have been expecting the loss for a while, you may feel a sense of relief when it happens. Follow these instructions at home: Activity Express your feelings in healthy ways, such as: Talking with others about your loss. It may be helpful to find others who have had a similar loss, such as a support group. Writing down your feelings in a journal. Doing physical activities to release stress and emotional energy. Doing creative activities like painting, sculpting, or playing or listening to music. Practicing resilience. This is the ability to recover and adjust after facing challenges. Reading some resources that encourage resilience may help you to learn ways to practice those behaviors.  General  instructions Be patient with yourself and others. Allow the grieving process to happen, and remember that grieving takes time. It is likely that you may never feel completely done with some grief. You may find a way to move on while still cherishing memories and feelings about your loss. Accepting your loss is a process. It can take months or longer to adjust. Keep all follow-up visits. This is important. Where to find support To get support for managing loss: Ask your health care provider for help and recommendations, such as grief counseling or therapy. Think about joining a support group for people who are managing a loss. Where to find more information You can find more information about managing loss from: American Society of Clinical Oncology: www.cancer.net American Psychological Association: dicetournament.ca Contact a health care provider if: Your grief is extreme and keeps getting worse. You have ongoing grief that does not improve. Your body shows symptoms of grief, such as illness. You feel depressed, anxious, or hopeless. Get help right away if: You have thoughts about hurting yourself or others. Get help right away if you feel like you may hurt yourself or others, or have thoughts about taking your own life. Go to your nearest emergency room or: Call 911. Call the National Suicide Prevention Lifeline at 231-310-0401 or 988. This is open 24 hours a day. Text the Crisis Text Line at 778-108-9794. Summary Grief is the result of a major change or an absence of someone or something that you count on. Grief is a normal reaction to loss. The depth of grief and the period of recovery depend on the type of loss and your ability to adjust to the change and process your feelings. Processing grief requires patience and a willingness to accept your feelings and talk about your loss with people who are supportive. It is important to find resources that work for you and to realize that people  experience grief differently. There is not one grieving process that works for  everyone in the same way. Be aware that when grief becomes extreme, it can lead to more severe issues like isolation, depression, anxiety, or suicidal thoughts. Talk with your health care provider if you have any of these issues. This information is not intended to replace advice given to you by your health care provider. Make sure you discuss any questions you have with your health care provider. Document Revised: 12/16/2020 Document Reviewed: 12/16/2020 Elsevier Patient Education  2024 Elsevier Inc.    Signed,   Reyes Pines, MD [facility], [facility] 03/29/24 3:23 PM

## 2024-03-30 LAB — CBC
HCT: 39.4 % (ref 36.0–46.0)
Hemoglobin: 12.9 g/dL (ref 12.0–15.0)
MCHC: 32.8 g/dL (ref 30.0–36.0)
MCV: 89.2 fl (ref 78.0–100.0)
Platelets: 213 K/uL (ref 150.0–400.0)
RBC: 4.42 Mil/uL (ref 3.87–5.11)
RDW: 13.4 % (ref 11.5–15.5)
WBC: 6.3 K/uL (ref 4.0–10.5)

## 2024-03-30 LAB — BASIC METABOLIC PANEL WITH GFR
BUN: 12 mg/dL (ref 6–23)
CO2: 26 meq/L (ref 19–32)
Calcium: 8.8 mg/dL (ref 8.4–10.5)
Chloride: 98 meq/L (ref 96–112)
Creatinine, Ser: 1.13 mg/dL (ref 0.40–1.20)
GFR: 42.31 mL/min — ABNORMAL LOW (ref >60.00)
Glucose, Bld: 138 mg/dL — ABNORMAL HIGH (ref 70–99)
Potassium: 3.7 meq/L (ref 3.5–5.1)
Sodium: 134 meq/L — ABNORMAL LOW (ref 135–145)

## 2024-03-30 LAB — TSH: TSH: 0.88 u[IU]/mL (ref 0.35–5.50)

## 2024-04-02 ENCOUNTER — Encounter: Payer: Self-pay | Admitting: Family Medicine

## 2024-04-04 ENCOUNTER — Ambulatory Visit: Payer: Self-pay | Admitting: Family Medicine

## 2024-04-04 ENCOUNTER — Ambulatory Visit: Admitting: Physician Assistant

## 2024-04-04 ENCOUNTER — Encounter: Payer: Self-pay | Admitting: Physician Assistant

## 2024-04-04 VITALS — BP 122/60 | HR 73 | Ht 61.0 in | Wt 126.0 lb

## 2024-04-04 DIAGNOSIS — K52832 Lymphocytic colitis: Secondary | ICD-10-CM

## 2024-04-04 DIAGNOSIS — K219 Gastro-esophageal reflux disease without esophagitis: Secondary | ICD-10-CM | POA: Diagnosis not present

## 2024-04-04 DIAGNOSIS — K529 Noninfective gastroenteritis and colitis, unspecified: Secondary | ICD-10-CM | POA: Diagnosis not present

## 2024-04-04 MED ORDER — PANTOPRAZOLE SODIUM 40 MG PO TBEC: 40.0000 mg | DELAYED_RELEASE_TABLET | Freq: Every day | ORAL | 3 refills | Status: DC

## 2024-04-04 NOTE — Progress Notes (Signed)
 Rachel Console, PA-C [address] [place], [place]  [address] Phone: [phone]   Primary Care Physician: Mahlon Comer BRAVO, MD  Primary Gastroenterologist:  Rachel Console, PA-C / Dr. Gordy Starch   Chief Complaint:  Follow-up GERD, chronic diarrhea, and lymphocytic colitis       HPI:   Discussed the use of AI scribe software for clinical note transcription with the patient, who gave verbal consent to proceed.  Here for follow-up of GERD, chronic diarrhea, lymphocytic colitis.  Also has history of distal esophageal stricture.  Currently taking famotidine  20 mg twice daily and Prilosec 40 mg daily.  She is here today with her granddaughter who helps with her care.  Patient has dementia and is poor historian.  History of Present Illness She experiences regurgitation of food, describing it as food 'coming back up', associated with hoarseness of voice. Despite being on medication for acid reflux, she finds it ineffective. Her last upper endoscopy was a year ago, during which her esophagus was stretched.  She has a history of lymphocytic colitis. She describes her bowel movements as diarrhea every time she uses the bathroom, with the sound resembling urination. She does not take any fiber supplements currently.  She denies vomiting or food getting stuck when swallowing. She reports diarrhea with every bowel movement.  She has multiple medication allergies and cannot take budesonide .  She reports increased bowel sounds.  09/2022 EGD (for dysphagia): 2 benign-appearing moderate distal esophageal stenoses.  Dilated to 17 mm.  2 cm hiatal hernia.  03/2021 EGD: Schatzki's ring at [facility].  3 cm hiatal hernia.  Normal stomach and duodenum.  08/2015 last colonoscopy: Small internal hemorrhoids.  Mild diverticulosis.  Otherwise normal with no polyps.  No repeat due to age.  Biopsies    Current Outpatient Medications  Medication Sig Dispense Refill   acetaminophen  (TYLENOL ) 500 MG tablet  Take 500 mg by mouth every 6 (six) hours as needed for mild pain.     clonazePAM  (KLONOPIN ) 0.5 MG tablet TAKE 1/2 TO 1 TABLET BY MOUTH DAILY AS NEEDED 30 tablet 3   donepezil  (ARICEPT ) 5 MG tablet Take 1 tablet (5 mg total) by mouth at bedtime. 90 tablet 1   famotidine  (PEPCID ) 20 MG tablet Take 20 mg by mouth 2 (two) times daily.     fluticasone  (FLONASE ) 50 MCG/ACT nasal spray Place 2 sprays into both nostrils daily. 16 g 6   lisinopril  (ZESTRIL ) 20 MG tablet TAKE ONE (1) TABLET BY MOUTH EVERY DAY 90 tablet 1   loratadine  (CLARITIN ) 10 MG tablet Take 1 tablet (10 mg total) by mouth daily. 30 tablet 11   meloxicam  (MOBIC ) 7.5 MG tablet TAKE 1 TABLET BY MOUTH DAILY AS NEEDED FOR PAIN 30 tablet 1   mesalamine  (LIALDA ) 1.2 g EC tablet Take 1 tablet (1.2 g total) by mouth 2 (two) times daily. 60 tablet 11   Multiple Vitamins-Minerals (PRESERVISION AREDS PO) Take by mouth.     naphazoline-pheniramine (NAPHCON-A) 0.025-0.3 % ophthalmic solution Place 1 drop into both eyes as needed.     Omega-3 Fatty Acids  (FISH OIL) 1200 MG CPDR Take by mouth.     potassium chloride  (KLOR-CON ) 10 MEQ tablet TAKE ONE (1) TABLET BY MOUTH EVERY DAY 90 tablet 1   rosuvastatin  (CRESTOR ) 10 MG tablet Take 1 tablet (10 mg total) by mouth daily. 90 tablet 0   traZODone  (DESYREL ) 50 MG tablet Take 0.5-1 tablets (25-50 mg total) by mouth at bedtime as needed  for sleep. 30 tablet 1   azelastine  (ASTELIN ) 0.1 % nasal spray Place 1 spray into both nostrils 2 (two) times daily. Use in each nostril as directed (Patient not taking: Reported on 04/04/2024) 30 mL 5   pantoprazole  (PROTONIX ) 40 MG tablet Take 1 tablet (40 mg total) by mouth daily before breakfast. 90 tablet 3   No current facility-administered medications for this visit.    Allergies as of 04/04/2024 - Review Complete 04/04/2024  Allergen Reaction Noted   Alendronate sodium Other (See Comments) 08/24/2010   Lexapro  [escitalopram  oxalate] Other (See Comments)  04/19/2018   Amoxicillin  Other (See Comments) 06/07/2014   Aspirin  Other (See Comments) 05/20/2016   Budesonide  Other (See Comments) 01/23/2016   Clindamycin /lincomycin Other (See Comments) 03/17/2018   Keflex [cephalexin] Other (See Comments) 03/17/2018   Levofloxacin  Other (See Comments) 02/19/2015   Prednisone  Other (See Comments) 08/24/2010   Sudafed [pseudoephedrine hcl] Other (See Comments) 08/24/2010   Sulfa antibiotics Hives 08/24/2010   Toprol xl [metoprolol succinate] Other (See Comments) 08/24/2010   Trimethoprim  11/17/2012   Zithromax  [azithromycin ] Rash 03/07/2019    Past Medical History:  Diagnosis Date   Acute gastric ulcer    Allergy    Anxiety    Atrophic vaginitis    Barrett's esophagus    Basal cell carcinoma of nose     under nose   Cataract    Depression    Diverticulosis    Esophageal stricture    Esophageal ulcer    Fatty liver    Gall stones 06/15/2013   GERD (gastroesophageal reflux disease)    Heart murmur    Hiatal hernia    Hx of adenomatous colonic polyps    Hyperlipidemia    Hypertension    Hypokalemia    IBS (irritable bowel syndrome)    Kyphosis    Lymphocytic colitis    Macular degeneration    ?wet or dry; ? 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    Tremor    Vitamin D  deficiency     Past Surgical History:  Procedure Laterality Date   ABDOMINAL HYSTERECTOMY  1980   APPENDECTOMY     BACK SURGERY     BREAST BIOPSY Left    CARPAL TUNNEL RELEASE Left 09/13/2020   Procedure: CARPAL TUNNEL RELEASE;  Surgeon: Camella Fallow, MD;  Location: [facility] OR;  Service: Orthopedics;  Laterality: Left;   CATARACT EXTRACTION W/ INTRAOCULAR LENS  IMPLANT, BILATERAL Bilateral    COLONOSCOPY     ESOPHAGOGASTRODUODENOSCOPY (EGD) WITH ESOPHAGEAL DILATION  X 1   EYE SURGERY     LUMBAR DISC SURGERY      fragmented disc   ORIF RADIAL FRACTURE Left 09/13/2020   Procedure: HARDWARE REMOVAL, OPEN REDUCTION INTERNAL FIXATION LEFT RADIUS;  Surgeon: Camella Fallow, MD;  Location: [facility] OR;  Service: Orthopedics;  Laterality: Left;  Block with IV sedation   POLYPECTOMY     SQUAMOUS CELL CARCINOMA EXCISION     nose tip    Review of Systems:    All systems reviewed and negative except where noted in HPI.    Physical Exam:  BP 122/60   Pulse 73   Ht 5' 1 (1.549 m)   Wt 126 lb (57.2 kg)   BMI 23.81 kg/m  No LMP recorded. Patient has had a  hysterectomy.  General: Feeble elderly female in no acute distress.  Sitting in a wheelchair. Lungs: Clear to auscultation bilaterally. Non-labored. Heart: Regular rate and rhythm, no murmurs rubs or gallops.  Abdomen: Bowel sounds are normal; Abdomen is Soft; No hepatosplenomegaly, masses or hernias;  No Abdominal Tenderness; No guarding or rebound tenderness. Neuro: Alert and oriented x 3.  Grossly intact.  Moderate memory impairment noted. Psych: Alert and cooperative, normal mood and affect.   Imaging Studies: MM 3D SCREENING MAMMOGRAM BILATERAL BREAST Result Date: 03/27/2024 CLINICAL DATA:  Screening. EXAM: DIGITAL SCREENING BILATERAL MAMMOGRAM WITH TOMOSYNTHESIS AND CAD TECHNIQUE: Bilateral screening digital craniocaudal and mediolateral oblique mammograms were obtained. Bilateral screening digital breast tomosynthesis was performed. The images were evaluated with computer-aided detection. COMPARISON:  Previous exam(s). ACR Breast Density Category c: The breasts are heterogeneously dense, which may obscure small masses. FINDINGS: There are no findings suspicious for malignancy. IMPRESSION: No mammographic evidence of malignancy. A result letter of this screening mammogram will be mailed directly to the patient. RECOMMENDATION: Screening mammogram in one year. (Code:SM-B-01Y) BI-RADS CATEGORY  1: Negative. Electronically Signed   By: Dina  Arceo M.D.    On: 03/27/2024 10:49    Labs: CBC    Component Value Date/Time   WBC 6.3 03/29/2024 1523   RBC 4.42 03/29/2024 1523   HGB 12.9 03/29/2024 1523   HGB 11.9 04/21/2022 1444   HCT 39.4 03/29/2024 1523   HCT 35.8 04/21/2022 1444   PLT 213.0 03/29/2024 1523   PLT 273 04/21/2022 1444   MCV 89.2 03/29/2024 1523   MCV 90 04/21/2022 1444   MCH 30.2 02/22/2024 1634   MCHC 32.8 03/29/2024 1523   RDW 13.4 03/29/2024 1523   RDW 12.6 04/21/2022 1444   LYMPHSABS 2.3 09/21/2023 1144   LYMPHSABS 2.4 04/21/2022 1444   MONOABS 0.5 09/21/2023 1144   EOSABS 0.2 09/21/2023 1144   EOSABS 0.2 04/21/2022 1444   BASOSABS 0.1 09/21/2023 1144   BASOSABS 0.1 04/21/2022 1444    CMP     Component Value Date/Time   NA 134 (L) 03/29/2024 1523   NA 138 04/21/2022 1444   K 3.7 03/29/2024 1523   CL 98 03/29/2024 1523   CO2 26 03/29/2024 1523   GLUCOSE 138 (H) 03/29/2024 1523   BUN 12 03/29/2024 1523   BUN 14 04/21/2022 1444   CREATININE 1.13 03/29/2024 1523   CREATININE 0.82 03/05/2020 1201   CALCIUM  8.8 03/29/2024 1523   PROT 6.4 (L) 02/22/2024 1634   PROT 6.4 04/21/2022 1444   ALBUMIN 4.0 02/22/2024 1634   ALBUMIN 4.1 04/21/2022 1444   AST 27 02/22/2024 1634   ALT 15 02/22/2024 1634   ALKPHOS 63 02/22/2024 1634   BILITOT 0.3 02/22/2024 1634   BILITOT 0.2 04/21/2022 1444   GFRNONAA 46 (L) 02/22/2024 1634   GFRNONAA 61 11/21/2012 1632   GFRAA >60 09/29/2019 1519   GFRAA 70 11/21/2012 1632     Assessment and Plan:   LAMOINE FREDRICKSEN is a 89 y.o. y/o female presents for annual follow-up of: Assessment & Plan 1.  Gastroesophageal reflux disease with regurgitation and hoarseness Chronic GERD with regurgitation and hoarseness. Prilosec ineffective. Symptoms likely due to esophageal irritation from acid reflux. No significant dysphagia. No current indication for repeat endoscopy unless she develops solid food dysphagia. - Discontinued Prilosec. - Start pantoprazole  40 mg once daily. -Continue  famotidine  40 mg twice daily. - Advised to avoid eating or drinking 2-3 hours before lying down. - Recommended staying upright after eating. -  Avoid GERD trigger foods and drinks.  Irritable bowel syndrome and history of lymphocytic colitis with chronic diarrhea.  She cannot take budesonide  due to allergy. - Initiated FiberColon, two tablets once daily with a large glass of water. - Take OTC Imodium as needed, 1 or 2 tablets 3 times daily as needed  Rachel Console, PA-C  Follow up in 1 year to refill medication.  Follow-up sooner if symptoms worsen.

## 2024-04-04 NOTE — Patient Instructions (Addendum)
  For Diarrhea: Start Fiber Con Tablets Take 2 Caplets with a full glass of water or other liquid (8 ounces/240 milliliters) Once Daily.    For Acid Reflux / Voice Hoarseness: - Stop Prilosec (Omeprazole ) - Start Rx Pantoprazole  40mg  1 tablet once daily, take 30 minutes before breakfast. - Recommend Lifestyle Modifications to prevent Acid Reflux.  Rec. Avoid coffee, sodas, peppermint, garlic, onions, alcohol, citrus fruits, chocolate, tomatoes, fatty and spicey foods.  Avoid eating 2-3 hours before bedtime.    Please follow up sooner if symptoms increase or worsen  Due to recent changes in healthcare laws, you may see the results of your imaging and laboratory studies on MyChart before your provider has had a chance to review them.  We understand that in some cases there may be results that are confusing or concerning to you. Not all laboratory results come back in the same time frame and the provider may be waiting for multiple results in order to interpret others.  Please give us  48 hours in order for your provider to thoroughly review all the results before contacting the office for clarification of your results.   Thank you for trusting me with your gastrointestinal care!   Ellouise Console, PA-C _______________________________________________________  If your blood pressure at your visit was 140/90 or greater, please contact your primary care physician to follow up on this. _______________________________________________________  If you are age 21 or older, your body mass index should be between 23-30. Your Body mass index is 23.81 kg/m. If this is out of the aforementioned range listed, please consider follow up with your Primary Care Provider.  If you are age 50 or younger, your body mass index should be between 19-25. Your Body mass index is 23.81 kg/m. If this is out of the aformentioned range listed, please consider follow up with your Primary Care Provider.   ________________________________________________________  The [facility] providers would like to encourage you to use MYCHART to communicate with providers for non-urgent requests or questions.  Due to long hold times on the telephone, sending your provider a message by [facility] may be a faster and more efficient way to get a response.  Please allow 48 business hours for a response.  Please remember that this is for non-urgent requests.  _______________________________________________________

## 2024-04-07 ENCOUNTER — Other Ambulatory Visit: Payer: Self-pay | Admitting: Family Medicine

## 2024-04-07 DIAGNOSIS — M1612 Unilateral primary osteoarthritis, left hip: Secondary | ICD-10-CM

## 2024-04-10 NOTE — Progress Notes (Signed)
 Lab results have been discussed.   Verbalized understanding? Yes  Are there any questions? No

## 2024-04-11 ENCOUNTER — Encounter: Admitting: Family Medicine

## 2024-04-12 ENCOUNTER — Encounter: Payer: Self-pay | Admitting: Family Medicine

## 2024-04-12 ENCOUNTER — Telehealth: Payer: Self-pay

## 2024-04-12 ENCOUNTER — Ambulatory Visit: Admitting: Family Medicine

## 2024-04-12 VITALS — BP 192/77 | HR 87 | Temp 99.0°F | Ht 61.0 in | Wt 125.6 lb

## 2024-04-12 DIAGNOSIS — F4320 Adjustment disorder, unspecified: Secondary | ICD-10-CM

## 2024-04-12 DIAGNOSIS — I10 Essential (primary) hypertension: Secondary | ICD-10-CM

## 2024-04-12 DIAGNOSIS — H938X3 Other specified disorders of ear, bilateral: Secondary | ICD-10-CM | POA: Diagnosis not present

## 2024-04-12 MED ORDER — SERTRALINE HCL 25 MG PO TABS: 25.0000 mg | ORAL_TABLET | Freq: Every day | ORAL | 3 refills | Status: DC

## 2024-04-12 NOTE — Telephone Encounter (Signed)
 There was obviously a lot of tension today between the 2 women and I could sense frustration on both sides.  This makes for less than an ideal caregiving situation.  That said, even if things are difficult at home, I don't think that nursing home or assisted living placement should ever be used as a threat as these places should be viewed as helpful and necessary when the time comes; and not as a form of punishment.  I also don't feel that the answer is to 'admit her' which is what Meghan recommended today- there is no medical reason at this time and that would be very distressing and disorienting to her.  I agree that Ms Ogarro should not be driving under any circumstance- and she was told this.  She was in agreement with this today at her appt.  If Mr Bazzano is concerned about mom and what she is saying or how she is acting, I encourage him to come to her next visit.  I don't feel that she said anything today that was spiteful or came across as retribution.  We are starting a daily controller medication for her depression and anxiety so that we can hopefully stop the Clonazepam  as this can make people confused or irritable.  Hopefully we will be able to improve things over the next few weeks for everyone involved.

## 2024-04-12 NOTE — Telephone Encounter (Signed)
 Copied from CRM [id number]. Topic: General - Call Back - No Documentation >> Apr 12, 2024  3:34 PM Mercedes MATSU wrote: Reason for CRM: Patients son called in requesting a call back nurse to discuss whatever occurred in office today. Birtha Hatler can  be reached at [phone]

## 2024-04-12 NOTE — Patient Instructions (Addendum)
 Follow up in 2-3 weeks to recheck grief START the Sertraline once daily for anxiety/nervousness/sadness I put in a referral for [facility].  They will call you to schedule They will call you to schedule your ENT referral for the ear issues Call with any questions or concerns Hang in there!

## 2024-04-12 NOTE — Progress Notes (Unsigned)
   Subjective:    Patient ID: Rachel Walton, female    DOB: [birth year], 89 y.o.   MRN: [id number]  HPI HTN- chronic problem.  BP is elevated today but on 11/19 and 11/25 it was 120s/60s.  Grief- was given trazodone  to sleep but this caused hallucinations.  Family reports mood swings are 'horrible'.  'she cries nonstop'.  Family says they were close to taking her to [facility] due to severe anxiety and depression.  'i get nervous.  I can't help it'.  Is very irritable and defensive.  Stopped Aricept .  Ears clogged- pt reports other physicians have looked at ears and said they are clear but she still feels they are clogged.     Review of Systems For ROS see HPI     Objective:   Physical Exam        Assessment & Plan:

## 2024-04-12 NOTE — Telephone Encounter (Signed)
 Called son Elsie Jeannetta) about his concerns/questions about the visit. He would like to know about Bettys mental status discussion.  He expresses some concerns that Jhana was dishonest during the visit, he did not have a full concept of what was said during the visit. He mentioned that money is counted by Meghan with consent from [place], notes he doesn't feel she should drive but she insist on having a key. He notes concerns she is no longer comprehending well due to a conversation about Hospice and she became irrate despite them denying they were placing her anywhere. Son did mention in the call sometimes we don't have control over where we have to be placed  He notes that he feels she said what she did during the visit to be mean spirited and get back at the family for talking about putting her in a home despite his claims that was not the intent of that discussion.  Dempsey goes on to say that Meghan(the woman accompanying Kristine today) is an [employer] whom he trusts explicitly, she takes Deletha to her appointments and stays with her during the week so she is not alone.  He also notes Raizel has been very irritable and mean spirited recently especially when taking Clonazepam  she will nap and then wake up angry.    I informed Dempsey I would relay these concerns to Dr Mahlon and we would revisit this discussion once it has been reviewed.

## 2024-04-13 ENCOUNTER — Encounter (INDEPENDENT_AMBULATORY_CARE_PROVIDER_SITE_OTHER): Payer: Self-pay

## 2024-04-13 DIAGNOSIS — R509 Fever, unspecified: Secondary | ICD-10-CM | POA: Diagnosis not present

## 2024-04-13 DIAGNOSIS — J069 Acute upper respiratory infection, unspecified: Secondary | ICD-10-CM | POA: Diagnosis not present

## 2024-04-13 NOTE — Assessment & Plan Note (Signed)
 Deteriorated.  BP is very elevated today but pt is notably anxious and upset.  Her last 2 visits ([facility]) her BP was 120s/60s.  I fear that adjusting her medication would cause a drop in BP that could result in dizziness/falls/syncope.  Will hold on medication changes at this time but will follow.  Pt expressed understanding and is in agreement w/ plan.

## 2024-04-13 NOTE — Assessment & Plan Note (Signed)
 Ongoing.  No evidence of cerumen impaction today so no need for irrigation.  Will refer to ENT.

## 2024-04-13 NOTE — Telephone Encounter (Signed)
 [place] [employer] were called this afternoon regarding the visit yesterday and Ms Osorno grabbing my arm and asking for 'help' when Megan stepped out of the room.  She said that Megan is aggressive with her, she doesn't trust [place], and 'all she wants to do is count my money'.  Pt was told at time of visit that I was going to call and file a report

## 2024-04-14 ENCOUNTER — Other Ambulatory Visit: Payer: Self-pay

## 2024-04-14 ENCOUNTER — Encounter (HOSPITAL_COMMUNITY): Payer: Self-pay | Admitting: Emergency Medicine

## 2024-04-14 ENCOUNTER — Emergency Department (HOSPITAL_COMMUNITY): Admission: EM | Admit: 2024-04-14 | Discharge: 2024-04-14 | Disposition: A | Discharge status: Home / Self Care

## 2024-04-14 ENCOUNTER — Emergency Department (HOSPITAL_COMMUNITY)

## 2024-04-14 DIAGNOSIS — J069 Acute upper respiratory infection, unspecified: Secondary | ICD-10-CM | POA: Diagnosis not present

## 2024-04-14 DIAGNOSIS — I10 Essential (primary) hypertension: Secondary | ICD-10-CM | POA: Diagnosis not present

## 2024-04-14 DIAGNOSIS — B9789 Other viral agents as the cause of diseases classified elsewhere: Secondary | ICD-10-CM | POA: Diagnosis not present

## 2024-04-14 DIAGNOSIS — R531 Weakness: Secondary | ICD-10-CM | POA: Diagnosis not present

## 2024-04-14 DIAGNOSIS — E860 Dehydration: Secondary | ICD-10-CM | POA: Diagnosis not present

## 2024-04-14 LAB — BASIC METABOLIC PANEL WITH GFR
Anion gap: 7 (ref 5–15)
BUN: 10 mg/dL (ref 8–23)
CO2: 28 mmol/L (ref 22–32)
Calcium: 8.6 mg/dL — ABNORMAL LOW (ref 8.9–10.3)
Chloride: 99 mmol/L (ref 98–111)
Creatinine, Ser: 0.87 mg/dL (ref 0.44–1.00)
GFR, Estimated: >60 mL/min (ref >60)
Glucose, Bld: 109 mg/dL — ABNORMAL HIGH (ref 70–99)
Potassium: 4 mmol/L (ref 3.5–5.1)
Sodium: 134 mmol/L — ABNORMAL LOW (ref 135–145)

## 2024-04-14 LAB — HEPATIC FUNCTION PANEL
ALT: 18 U/L (ref 0–44)
AST: 27 U/L (ref 15–41)
Albumin: 4 g/dL (ref 3.5–5.0)
Alkaline Phosphatase: 57 U/L (ref 38–126)
Bilirubin, Direct: 0.2 mg/dL (ref 0.0–0.2)
Indirect Bilirubin: 0.1 mg/dL — ABNORMAL LOW (ref 0.3–0.9)
Total Bilirubin: 0.3 mg/dL (ref 0.0–1.2)
Total Protein: 6.2 g/dL — ABNORMAL LOW (ref 6.5–8.1)

## 2024-04-14 LAB — CBC WITH DIFFERENTIAL/PLATELET
Abs Immature Granulocytes: 0.01 K/uL (ref 0.00–0.07)
Basophils Absolute: 0 K/uL (ref 0.0–0.1)
Basophils Relative: 1 %
Eosinophils Absolute: 0.1 K/uL (ref 0.0–0.5)
Eosinophils Relative: 1 %
HCT: 36.7 % (ref 36.0–46.0)
Hemoglobin: 11.9 g/dL — ABNORMAL LOW (ref 12.0–15.0)
Immature Granulocytes: 0 %
Lymphocytes Relative: 33 %
Lymphs Abs: 1.9 K/uL (ref 0.7–4.0)
MCH: 29.2 pg (ref 26.0–34.0)
MCHC: 32.4 g/dL (ref 30.0–36.0)
MCV: 90.2 fL (ref 80.0–100.0)
Monocytes Absolute: 0.4 K/uL (ref 0.1–1.0)
Monocytes Relative: 7 %
Neutro Abs: 3.3 K/uL (ref 1.7–7.7)
Neutrophils Relative %: 58 %
Platelets: 274 K/uL (ref 150–400)
RBC: 4.07 MIL/uL (ref 3.87–5.11)
RDW: 12.9 % (ref 11.5–15.5)
WBC: 5.7 K/uL (ref 4.0–10.5)
nRBC: 0 % (ref 0.0–0.2)

## 2024-04-14 LAB — RESP PANEL BY RT-PCR (RSV, FLU A&B, COVID)  RVPGX2
Influenza A by PCR: NEGATIVE
Influenza B by PCR: NEGATIVE
Resp Syncytial Virus by PCR: NEGATIVE
SARS Coronavirus 2 by RT PCR: NEGATIVE

## 2024-04-14 LAB — LIPASE, BLOOD: Lipase: 32 U/L (ref 11–51)

## 2024-04-14 MED ORDER — LACTATED RINGERS IV BOLUS
1000.0000 mL | Freq: Once | INTRAVENOUS | Status: AC
  Administered 2024-04-14: 1000 mL via INTRAVENOUS

## 2024-04-14 NOTE — ED Triage Notes (Signed)
 Pt here from home with c/o weakness, pt was seen at [facility] a few days ago and dx with URI , family was told if pt not feeling better in a few days to come to the Ed

## 2024-04-14 NOTE — Telephone Encounter (Signed)
 Received fax this morning requesting medical records covering the last 3 office visits with associated information and Dx, this has been forwarded to Medical records department (per Cassie R) and a copy has been placed in the chart at this time.

## 2024-04-14 NOTE — ED Provider Notes (Signed)
  EMERGENCY DEPARTMENT AT [facility] Provider Note   CSN: [id number] Arrival date & time: 04/14/24  1238     Patient presents with: Weakness   Rachel Walton is a 89 y.o. female.   89 year old female presents for evaluation of weakness.  She was sent in by EMS.  Reportedly was seen at urgent care few days ago diagnosed with a viral URI and told to come to the ER for symptoms and improved.  Patient denies any complaints at this time.  States her husband died in 03-02-2024 and she has not been eating or drinking much since.  States her family members wanted her evaluated in the ER today.  She largely has no complaints.   Weakness Associated symptoms: no abdominal pain, no arthralgias, no chest pain, no cough, no dysuria, no fever, no seizures, no shortness of breath and no vomiting        Prior to Admission medications   Medication Sig Start Date End Date Taking? Authorizing Provider  acetaminophen  (TYLENOL ) 500 MG tablet Take 500 mg by mouth every 6 (six) hours as needed for mild pain.    [provider]  azelastine  (ASTELIN ) 0.1 % nasal spray Place 1 spray into both nostrils 2 (two) times daily. Use in each nostril as directed Patient not taking: Reported on 04/04/2024 11/23/22   Tabori, Katherine E, MD  clonazePAM  (KLONOPIN ) 0.5 MG tablet TAKE 1/2 TO 1 TABLET BY MOUTH DAILY AS NEEDED 01/26/24   Tabori, Katherine E, MD  donepezil  (ARICEPT ) 5 MG tablet Take 1 tablet (5 mg total) by mouth at bedtime. 02/22/24   Jerrell Cleatus Ned, MD  famotidine  (PEPCID ) 20 MG tablet Take 20 mg by mouth 2 (two) times daily.    Tabori, Katherine E, MD  fluticasone  (FLONASE ) 50 MCG/ACT nasal spray Place 2 sprays into both nostrils daily. 12/12/21   Gladis, Mary-Margaret, FNP  lisinopril  (ZESTRIL ) 20 MG tablet TAKE ONE (1) TABLET BY MOUTH EVERY DAY 02/18/24   Tabori, Katherine E, MD  loratadine  (CLARITIN ) 10 MG tablet Take 1 tablet (10 mg total) by mouth daily. 05/14/23   Tabori,  Katherine E, MD  meloxicam  (MOBIC ) 7.5 MG tablet TAKE 1 TABLET BY MOUTH DAILY AS NEEDED FOR PAIN 04/11/24   Tabori, Katherine E, MD  mesalamine  (LIALDA ) 1.2 g EC tablet Take 1 tablet (1.2 g total) by mouth 2 (two) times daily. 04/27/23   Tabori, Katherine E, MD  Multiple Vitamins-Minerals (PRESERVISION AREDS PO) Take by mouth.    [provider]  naphazoline-pheniramine (NAPHCON-A) 0.025-0.3 % ophthalmic solution Place 1 drop into both eyes as needed. 05/05/13   [provider]  Omega-3 Fatty Acids  (FISH OIL) 1200 MG CPDR Take by mouth.    [provider]  pantoprazole  (PROTONIX ) 40 MG tablet Take 1 tablet (40 mg total) by mouth daily before breakfast. 04/04/24 03/30/25  [place], PA-C  potassium chloride  (KLOR-CON ) 10 MEQ tablet TAKE ONE (1) TABLET BY MOUTH EVERY DAY 02/18/24   Tabori, Katherine E, MD  rosuvastatin  (CRESTOR ) 10 MG tablet Take 1 tablet (10 mg total) by mouth daily. 03/27/24   Tabori, Katherine E, MD  sertraline  (ZOLOFT ) 25 MG tablet Take 1 tablet (25 mg total) by mouth daily. 04/12/24   Tabori, Katherine E, MD    Allergies: Alendronate sodium, Lexapro  [escitalopram  oxalate], Amoxicillin , Aspirin , Budesonide , Clindamycin /lincomycin, Keflex [cephalexin], Levofloxacin , Prednisone , Sudafed [pseudoephedrine hcl], Sulfa antibiotics, Toprol xl [metoprolol succinate], Trimethoprim, and Zithromax  [azithromycin ]    Review of Systems  Constitutional:  Negative  for chills and fever.  HENT:  Negative for ear pain and sore throat.   Eyes:  Negative for pain and visual disturbance.  Respiratory:  Negative for cough and shortness of breath.   Cardiovascular:  Negative for chest pain and palpitations.  Gastrointestinal:  Negative for abdominal pain and vomiting.  Genitourinary:  Negative for dysuria and hematuria.  Musculoskeletal:  Negative for arthralgias and back pain.  Skin:  Negative for color change and rash.  Neurological:  Positive for weakness. Negative  for seizures and syncope.  All other systems reviewed and are negative.   Updated Vital Signs BP (!) 190/69   Pulse 78   Temp 98.1 F (36.7 C) (Oral)   Resp 19   Ht 5' 1 (1.549 m)   Wt 63.5 kg   SpO2 94%   BMI 26.45 kg/m   Physical Exam Vitals and nursing note reviewed.  Constitutional:      General: She is not in acute distress.    Appearance: Normal appearance. She is well-developed. She is not ill-appearing.  HENT:     Head: Normocephalic and atraumatic.  Eyes:     Conjunctiva/sclera: Conjunctivae normal.  Cardiovascular:     Rate and Rhythm: Normal rate and regular rhythm.     Heart sounds: No murmur heard. Pulmonary:     Effort: Pulmonary effort is normal. No respiratory distress.     Breath sounds: Normal breath sounds.  Abdominal:     Palpations: Abdomen is soft.     Tenderness: There is no abdominal tenderness.  Musculoskeletal:        General: No swelling.     Cervical back: Neck supple.  Skin:    General: Skin is warm and dry.     Capillary Refill: Capillary refill takes less than 2 seconds.  Neurological:     Mental Status: She is alert.  Psychiatric:        Mood and Affect: Mood normal.     (all labs ordered are listed, but only abnormal results are displayed) Labs Reviewed  BASIC METABOLIC PANEL WITH GFR - Abnormal; Notable for the following components:      Result Value   Sodium 134 (*)    Glucose, Bld 109 (*)    Calcium  8.6 (*)    All other components within normal limits  HEPATIC FUNCTION PANEL - Abnormal; Notable for the following components:   Total Protein 6.2 (*)    Indirect Bilirubin 0.1 (*)    All other components within normal limits  CBC WITH DIFFERENTIAL/PLATELET - Abnormal; Notable for the following components:   Hemoglobin 11.9 (*)    All other components within normal limits  RESP PANEL BY RT-PCR (RSV, FLU A&B, COVID)  RVPGX2  LIPASE, BLOOD    EKG: None  Radiology: DG Chest 1 View Result Date: 04/14/2024 CLINICAL  DATA:  sob EXAM: CHEST  1 VIEW COMPARISON:  12/30/2017 FINDINGS: No focal airspace consolidation, pleural effusion, or pneumothorax. No cardiomegaly. Aortic atherosclerosis. No acute fracture or destructive lesions. Multilevel thoracic osteophytosis. IMPRESSION: No acute cardiopulmonary abnormality. Electronically Signed   By: Rogelia Myers M.D.   On: 04/14/2024 13:49     Procedures   Medications Ordered in the ED  lactated ringers  bolus 1,000 mL (0 mLs Intravenous Stopped 04/14/24 1526)                                    Medical Decision Making Cardiac  monitor interpretation: Sinus rhythm, no ectopy  Patient here for general weakness and recent diagnosis of viral URI.  She recently lost her husband has not been eating and drinking as much as usual.  It was given a liter of IV fluids.  Lab workup unremarkable.  Patient is feeling reassured.  I advise close up with her primary care doctor.  Per family she recently started Zoloft  and this does seem to be helping.  All results and plan were discussed with patient and family at bedside they feel comfortable being discharged.  Advised to return for any new or worsening symptoms.  Problems Addressed: Viral URI: acute illness or injury Weakness: acute illness or injury  Amount and/or Complexity of Data Reviewed External Data Reviewed: notes.    Details: Prior ED records reviewed and shows ED visit on 02/22/2024 for UTI  Labs: ordered. Decision-making details documented in ED Course.    Details: Ordered and reviewed by me and unremarkable Radiology: ordered and independent interpretation performed. Decision-making details documented in ED Course.    Details: Ordered and interpreted by me independently of radiology Chest x-ray: Shows no acute abnormality ECG/medicine tests: ordered and independent interpretation performed. Decision-making details documented in ED Course.    Details: Ordered and interpreted by me in the absence of cardiology  and EKG shows sinus rhythm, no STEMI or significant change when compared to prior  Risk OTC drugs. Prescription drug management. Drug therapy requiring intensive monitoring for toxicity. Diagnosis or treatment significantly limited by social determinants of health.    Final diagnoses:  Weakness  Viral URI    ED Discharge Orders     None          Gennaro Duwaine CROME, DO 04/14/24 1549

## 2024-04-14 NOTE — Discharge Instructions (Addendum)
 Eat a balanced diet and drink lots of fluids.  Stay on your medications as prescribed.  Follow-up with your primary care doctor in 1 to 2 weeks.  Return to the ER for any new or worsening symptoms.

## 2024-04-17 ENCOUNTER — Telehealth: Payer: Self-pay

## 2024-04-17 NOTE — Telephone Encounter (Signed)
 Copied from CRM #[id number]. Topic: Referral - Status >> Apr 17, 2024  4:13 PM Alfonso HERO wrote: Reason for CRM: patients son is calling for ENT  specialist info asking for a callback with the details of where Referral has been sent. [phone] is his number.

## 2024-04-18 NOTE — Telephone Encounter (Signed)
 Referring To Provider Information [facility] [address] [place] [address] [phone]  Referral Start Date: 04/12/2024

## 2024-04-18 NOTE — Telephone Encounter (Signed)
 Rachel Walton has been notified of number

## 2024-04-21 ENCOUNTER — Encounter: Payer: Self-pay | Admitting: Pharmacist

## 2024-04-21 NOTE — Progress Notes (Signed)
 Pharmacy Quality Measure Review  This patient is appearing on a report for being at risk of failing the adherence measure for cholesterol (statin) medications this calendar year.   Medication: rosuvastatin   Last fill date: 03/06/2024 for 30 day supply per adherence report.   Reviewed recent refill history in Dr Annemarie database. Actual last refill date was 04/07/2024 for 30 day supply. Patient has 2 refills remaining. Next appointment with PCP is 05/18/2024.    Insurance report was not up to date. No action needed at this time.   Madelin Ray, PharmD Clinical Pharmacist [facility] Primary Care  Population Health [phone]

## 2024-04-25 ENCOUNTER — Ambulatory Visit: Admitting: Family Medicine

## 2024-04-25 ENCOUNTER — Encounter: Payer: Self-pay | Admitting: Family Medicine

## 2024-04-25 VITALS — BP 139/65 | HR 70 | Temp 98.1°F | Ht 61.0 in | Wt 121.4 lb

## 2024-04-25 DIAGNOSIS — Z23 Encounter for immunization: Secondary | ICD-10-CM

## 2024-04-25 DIAGNOSIS — F4320 Adjustment disorder, unspecified: Secondary | ICD-10-CM

## 2024-04-25 DIAGNOSIS — Z79899 Other long term (current) drug therapy: Secondary | ICD-10-CM

## 2024-04-25 DIAGNOSIS — E782 Mixed hyperlipidemia: Secondary | ICD-10-CM

## 2024-04-25 DIAGNOSIS — H6991 Unspecified Eustachian tube disorder, right ear: Secondary | ICD-10-CM

## 2024-04-25 DIAGNOSIS — F0394 Unspecified dementia, unspecified severity, with anxiety: Secondary | ICD-10-CM

## 2024-04-25 DIAGNOSIS — H6591 Unspecified nonsuppurative otitis media, right ear: Secondary | ICD-10-CM

## 2024-04-25 DIAGNOSIS — I10 Essential (primary) hypertension: Secondary | ICD-10-CM

## 2024-04-25 DIAGNOSIS — K219 Gastro-esophageal reflux disease without esophagitis: Secondary | ICD-10-CM

## 2024-04-25 DIAGNOSIS — G250 Essential tremor: Secondary | ICD-10-CM

## 2024-04-25 MED ORDER — SERTRALINE HCL 50 MG PO TABS: 50.0000 mg | ORAL_TABLET | Freq: Every day | ORAL | 3 refills | Status: DC

## 2024-04-25 NOTE — Progress Notes (Signed)
 New Patient Office Visit  Patient ID: Rachel Walton, Female   DOB: May 08, 1930 89 y.o. MRN: [id number]  Chief Complaint  Patient presents with   Establish Care   Subjective:     Rachel Walton presents to establish care  HPI  Discussed the use of AI scribe software for clinical note transcription with the patient, who gave verbal consent to proceed.  History of Present Illness   Rachel Walton is a 89 year old female here today to establish care.   Duwaine Rummer care giver present. Patient lives alone. She reports Duwaine and the patient's son come to her home and help her.     Hx of dementia, hypertension, GERD, and hyperlipidemia who presents to establish care.   Neurocognitive symptoms and mood disturbance - Dementia with ongoing cognitive impairment. - Husband recently passed away. Zoloft  was started a couple weeks ago.  Mood improved and crying episodes reduced with Zoloft  25 mg daily, but effect does not last throughout the day. Patient reports that her grieving symptoms have improved with zoloft  and by going out and visiting people. Gare giver requesting increase of zoloft  dose.  - Prior use of trazodone  at night resulted in hallucinations. - Previous dementia medication caused behavioral changes including increased agitation and aggression per Megan.   Tremors - Persistent tremors managed with Klonopin , prescribed by a primary care provider. - Caregiver administers half a tablet in the morning and half at night - Tremors worsen without Klonopin  - Patient states that klonopin  was started by neurologist a few years ago but is not currently followed by neuro.  - Previous fam office providers have recommended tapering off of klonopin  due to their negative effects with dementia potentially worsening symptoms.   Hypertension - Caregiver reports that she has been holding the patient's BP medication for the past couple weeks and levels have been normal.   Hyperlipidemia -  Cholesterol medication taken as part of her regimen.  Gastrointestinal symptoms - Protonix  taken for GERD - Last GI visit was on 04/04/24.   Auditory symptoms - Experiences cracking sounds in her ears  - ENT referral ordered by previous office.  - ENT appointment scheduled for December 31st for evaluation.       Outpatient Encounter Medications as of 04/25/2024  Medication Sig   clonazePAM  (KLONOPIN ) 0.5 MG tablet TAKE 1/2 TO 1 TABLET BY MOUTH DAILY AS NEEDED   cyanocobalamin  (VITAMIN B12) 1000 MCG tablet Take 1,000 mcg by mouth daily.   lisinopril  (ZESTRIL ) 20 MG tablet TAKE ONE (1) TABLET BY MOUTH EVERY DAY   loratadine  (CLARITIN ) 10 MG tablet Take 1 tablet (10 mg total) by mouth daily.   mesalamine  (LIALDA ) 1.2 g EC tablet Take 1 tablet (1.2 g total) by mouth 2 (two) times daily.   Omega-3 Fatty Acids  (FISH OIL) 1200 MG CPDR Take by mouth.   pantoprazole  (PROTONIX ) 40 MG tablet Take 1 tablet (40 mg total) by mouth daily before breakfast.   potassium chloride  (KLOR-CON ) 10 MEQ tablet TAKE ONE (1) TABLET BY MOUTH EVERY DAY   rosuvastatin  (CRESTOR ) 10 MG tablet Take 1 tablet (10 mg total) by mouth daily.   sertraline  (ZOLOFT ) 50 MG tablet Take 1 tablet (50 mg total) by mouth daily.   [DISCONTINUED] sertraline  (ZOLOFT ) 25 MG tablet Take 1 tablet (25 mg total) by mouth daily.   [DISCONTINUED] acetaminophen  (TYLENOL ) 500 MG tablet Take 500 mg by mouth every 6 (six) hours as needed for mild pain.   [DISCONTINUED] azelastine  (ASTELIN )  0.1 % nasal spray Place 1 spray into both nostrils 2 (two) times daily. Use in each nostril as directed (Patient not taking: Reported on 04/04/2024)   [DISCONTINUED] donepezil  (ARICEPT ) 5 MG tablet Take 1 tablet (5 mg total) by mouth at bedtime.   [DISCONTINUED] famotidine  (PEPCID ) 20 MG tablet Take 20 mg by mouth 2 (two) times daily.   [DISCONTINUED] fluticasone  (FLONASE ) 50 MCG/ACT nasal spray Place 2 sprays into both nostrils daily.   [DISCONTINUED]  meloxicam  (MOBIC ) 7.5 MG tablet TAKE 1 TABLET BY MOUTH DAILY AS NEEDED FOR PAIN   [DISCONTINUED] Multiple Vitamins-Minerals (PRESERVISION AREDS PO) Take by mouth.   [DISCONTINUED] naphazoline-pheniramine (NAPHCON-A) 0.025-0.3 % ophthalmic solution Place 1 drop into both eyes as needed.   No facility-administered encounter medications on file as of 04/25/2024.    Past Medical History:  Diagnosis Date   Acute gastric ulcer    Allergy    Anxiety    Atrophic vaginitis    Barrett's esophagus    Basal cell carcinoma of nose     under nose   Cataract    Depression    Diverticulosis    Esophageal stricture    Esophageal ulcer    Fatty liver    Gall stones 06/15/2013   GERD (gastroesophageal reflux disease)    Heart murmur    Hiatal hernia    Hx of adenomatous colonic polyps    Hyperlipidemia    Hypertension    Hypokalemia    IBS (irritable bowel syndrome)    Kyphosis    Lymphocytic colitis    Macular degeneration    ?wet or dry; ? 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    Tremor    Vitamin D  deficiency     Past Surgical History:  Procedure Laterality Date   ABDOMINAL HYSTERECTOMY  1980   APPENDECTOMY     BACK SURGERY     BREAST BIOPSY Left    CARPAL TUNNEL RELEASE Left 09/13/2020   Procedure: CARPAL TUNNEL RELEASE;  Surgeon: Camella Fallow, MD;  Location: [facility] OR;  Service: Orthopedics;  Laterality: Left;   CATARACT EXTRACTION W/ INTRAOCULAR LENS  IMPLANT, BILATERAL Bilateral    COLONOSCOPY     ESOPHAGOGASTRODUODENOSCOPY (EGD) WITH ESOPHAGEAL DILATION  X 1   EYE SURGERY     LUMBAR DISC SURGERY     fragmented disc   ORIF RADIAL FRACTURE Left 09/13/2020   Procedure: HARDWARE REMOVAL, OPEN REDUCTION INTERNAL FIXATION LEFT RADIUS;  Surgeon: Camella Fallow, MD;  Location: [facility] OR;  Service: Orthopedics;  Laterality: Left;  Block  with IV sedation   POLYPECTOMY     SQUAMOUS CELL CARCINOMA EXCISION     nose tip    Family History  Problem Relation Age of Onset   Stroke Mother    Heart disease Father        MI   Heart attack Father    Cancer Sister        Brain tumor   Hypertension Sister    Osteoporosis Sister    Hypertension Sister    Breast cancer Sister 89   Hypertension Sister    Osteoporosis Sister    Hypertension Sister    Hypertension Sister    Stroke Sister    Hypertension Sister    Hypertension Sister    Colon cancer Brother  Heart disease Brother    Diabetes Son 8   Diabetes Son 78   Brain cancer Son        deceased   Cancer Son        brain   Diabetes Maternal Uncle    Colon cancer Other        nephew   Thyroid  cancer Other        niece   Breast cancer Other    Breast cancer Other    Breast cancer Other    Colon polyps Neg Hx    Esophageal cancer Neg Hx    Rectal cancer Neg Hx    Stomach cancer Neg Hx     Social History   Socioeconomic History   Marital status: Married    Spouse name: Elsie   Number of children: 3   Years of education: Not on file   Highest education level: Not on file  Occupational History   Occupation: Retired  Tobacco Use   Smoking status: Never   Smokeless tobacco: Never  Vaping Use   Vaping status: Never Used  Substance and Sexual Activity   Alcohol use: No    Alcohol/week: 0.0 standard drinks of alcohol   Drug use: No   Sexual activity: Not Currently  Other Topics Concern   Not on file  Social History Narrative   One son in [place]   One son, Matilda lives nearby and visits almost daily   Social Drivers of Health   Tobacco Use: Low Risk (04/25/2024)   Patient History    Smoking Tobacco Use: Never    Smokeless Tobacco Use: Never    Passive Exposure: Not on file  Financial Resource Strain: Low Risk (09/22/2023)   Overall Financial Resource Strain (CARDIA)    Difficulty of Paying Living Expenses: Not very hard  Food  Insecurity: Unknown (10/27/2023)   Epic    Worried About Programme Researcher, Broadcasting/film/video in the Last Year: Never true    [employer] of Food in the Last Year: Not on file  Transportation Needs: No Transportation Needs (10/27/2023)   Epic    Lack of Transportation (Medical): No    Lack of Transportation (Non-Medical): No  Physical Activity: Inactive (12/23/2022)   Exercise Vital Sign    Days of Exercise per Week: 0 days    Minutes of Exercise per Session: 0 min  Stress: No Stress Concern Present (12/23/2022)   [employer] of Occupational Health - Occupational Stress Questionnaire    Feeling of Stress : Only a little  Social Connections: Moderately Isolated (12/23/2022)   Social Connection and Isolation Panel    Frequency of Communication with Friends and Family: Twice a week    Frequency of Social Gatherings with Friends and Family: Once a week    Attends Religious Services: Never    Database Administrator or Organizations: No    Attends Banker Meetings: Never    Marital Status: Married  Catering Manager Violence: Not At Risk (10/27/2023)   Epic    Fear of Current or Ex-Partner: No    Emotionally Abused: No    Physically Abused: No    Sexually Abused: No  Depression (PHQ2-9): Low Risk (04/25/2024)   Depression (PHQ2-9)    PHQ-2 Score: 2  Alcohol Screen: Low Risk (12/23/2022)   Alcohol Screen    Last Alcohol Screening Score (AUDIT): 0  Housing: Low Risk (10/27/2023)   Epic    Unable to Pay for Housing in the Last Year: No  Number of Times Moved in the Last Year: 0    Homeless in the Last Year: No  Utilities: Not At Risk (10/27/2023)   Epic    Threatened with loss of utilities: No  Health Literacy: Adequate Health Literacy (10/27/2023)   B1300 Health Literacy    Frequency of need for help with medical instructions: Never    ROS    Objective:    BP 139/65   Pulse 70   Temp 98.1 F (36.7 C)   Ht 5' 1 (1.549 m)   Wt 121 lb 6.4 oz (55.1 kg)   SpO2 96%   BMI 22.94  kg/m   Physical Exam Vitals reviewed.  Constitutional:      Appearance: Normal appearance.  HENT:     Head: Normocephalic and atraumatic.     Right Ear: External ear normal.     Left Ear: Tympanic membrane, ear canal and external ear normal.     Ears:     Comments: Clear fluid bubbles behind R TM. No erythema.     Nose: Nose normal.     Mouth/Throat:     Mouth: Mucous membranes are moist.     Pharynx: Oropharynx is clear.  Eyes:     Extraocular Movements: Extraocular movements intact.     Conjunctiva/sclera: Conjunctivae normal.     Pupils: Pupils are equal, round, and reactive to light.  Cardiovascular:     Rate and Rhythm: Normal rate and regular rhythm.     Pulses: Normal pulses.     Heart sounds: Normal heart sounds. No murmur heard. Pulmonary:     Effort: Pulmonary effort is normal. No respiratory distress.     Breath sounds: Normal breath sounds.  Musculoskeletal:        General: No deformity. Normal range of motion.     Cervical back: Normal range of motion.  Skin:    General: Skin is warm and dry.  Neurological:     General: No focal deficit present.     Mental Status: She is alert and oriented to person, place, and time.  Psychiatric:        Mood and Affect: Mood normal.        Behavior: Behavior normal.          Assessment & Plan:   Problem List Items Addressed This Visit       Nervous and Auditory   Essential tremor   Relevant Orders   Ambulatory referral to Neurology     Other   Mixed hyperlipidemia   Other Visit Diagnoses       Dementia with anxiety, unspecified dementia severity, unspecified dementia type (HCC)    -  Primary   Relevant Medications   sertraline  (ZOLOFT ) 50 MG tablet   Other Relevant Orders   Ambulatory referral to Neurology     Grief reaction       Relevant Medications   sertraline  (ZOLOFT ) 50 MG tablet     Essential hypertension         Gastroesophageal reflux disease, unspecified whether esophagitis present          Right otitis media with effusion         Dysfunction of right eustachian tube         Controlled substance agreement signed       Relevant Orders   ToxASSURE Select 13 (MW), Urine     Encounter for immunization       Relevant Orders   Flu vaccine HIGH DOSE PF(Fluzone Trivalent) (  Completed)       Assessment and Plan    Dementia Progressive dementia with adverse reactions to medications. Zoloft  improved mood. Clonazepam  used for tremors but may worsen dementia. Neurology referral recommended. - Increased Zoloft  to 50 mg daily. - Referred to neurology for further evaluation and management of dementia and tremors. - Continue current clonazepam  regimen until neurology consultation.  Depressive symptoms related to bereavement - Increased Zoloft  to 50 mg daily. - Encouraged social interaction and engagement in activities.  Essential tremor Managed with clonazepam , effective but may worsen dementia. Neurology referral recommended due further evaulate tremor and dementia options.  - Continue current clonazepam  regimen until neurology consultation.  - Referred to neurology for further evaluation and management of tremors.  Hypertension Stable blood pressure readings. - Continue to monitor BP at home and if consistently above 130/80 restart medications.   Hyperlipidemia Managed with current medication regimen. - Continue crestor   Gastroesophageal reflux disease Managed with Protonix  under gastroenterologist care. - Continue current Protonix  regimen. - F/u with GI as recommended by them.   R Eustachian tube dysfunction with effusion Eustachian tube dysfunction with effusion. Flonase  recommended for fluid drainage. - Patient reports having flonase  at home but is not using.  - Use Flonase  nasal spray twice daily for the first week, then once daily. - Follow up with ENT on December 31st for further evaluation.  General health maintenance - F/u in 3 months for annual physical         No follow-ups on file.   Oneil LELON Severin, FNP [facility]

## 2024-04-25 NOTE — Progress Notes (Deleted)
 SABRA

## 2024-04-26 ENCOUNTER — Encounter: Payer: Self-pay | Admitting: Family Medicine

## 2024-04-26 ENCOUNTER — Telehealth: Payer: Self-pay | Admitting: Family Medicine

## 2024-04-26 NOTE — Telephone Encounter (Signed)
 Spoke with Megan and made her aware that letter has been completed and will be left up front for her to pick up. She voiced understanding and said she would come by the office today to get it.

## 2024-04-26 NOTE — Telephone Encounter (Unsigned)
 Copied from CRM #[id number]. Topic: General - Other >> Apr 26, 2024 10:58 AM Wess RAMAN wrote: Reason for CRM: Patient's granddaughter, Rachel Walton, stated she needs a letter from Alcus Anes, FNP stating she needs caregiver services. She would like to know the status of the letter. Advised that forms and letter may take 7-10 business days.  Callback #: [phone]

## 2024-04-28 LAB — TOXASSURE SELECT 13 (MW), URINE

## 2024-05-01 ENCOUNTER — Telehealth: Payer: Self-pay | Admitting: Family Medicine

## 2024-05-01 ENCOUNTER — Other Ambulatory Visit: Payer: Self-pay | Admitting: Family Medicine

## 2024-05-01 ENCOUNTER — Other Ambulatory Visit: Payer: Self-pay | Admitting: Internal Medicine

## 2024-05-01 DIAGNOSIS — F419 Anxiety disorder, unspecified: Secondary | ICD-10-CM

## 2024-05-01 NOTE — Telephone Encounter (Unsigned)
 Copied from CRM #[id number]. Topic: Clinical - Medication Refill >> May 01, 2024 10:38 AM Carrielelia G wrote: Medication: fluticasone  (FLONASE ) 50 MCG/ACT nasal spray  Has the patient contacted their pharmacy? No (Agent: If no, request that the patient contact the pharmacy for the refill. If patient does not wish to contact the pharmacy document the reason why and proceed with request.) (Agent: If yes, when and what did the pharmacy advise?)  This is the patient's preferred pharmacy:  [facility], [place] - [address] [place] [address] Phone: [phone] Fax: [phone]  Is this the correct pharmacy for this prescription? Yes If no, delete pharmacy and type the correct one.   Has the prescription been filled recently? No  Is the patient out of the medication? Yes  Has the patient been seen for an appointment in the last year OR does the patient have an upcoming appointment? Yes  Can we respond through MyChart? No  Agent: Please be advised that Rx refills may take up to 3 business days. We ask that you follow-up with your pharmacy.

## 2024-05-01 NOTE — Telephone Encounter (Signed)
 Medication not on pts current med list. Ok for Rx? Last seen 12/16. Has 75m f/u in March.

## 2024-05-01 NOTE — Telephone Encounter (Signed)
 Unable to pend

## 2024-05-02 ENCOUNTER — Other Ambulatory Visit: Payer: Self-pay | Admitting: Family Medicine

## 2024-05-02 DIAGNOSIS — J302 Other seasonal allergic rhinitis: Secondary | ICD-10-CM

## 2024-05-02 MED ORDER — FLUTICASONE PROPIONATE 50 MCG/ACT NA SUSP: 1.0000 | Freq: Every day | NASAL | 2 refills | Status: AC | PRN

## 2024-05-03 ENCOUNTER — Encounter: Payer: Self-pay | Admitting: Neurology

## 2024-05-10 ENCOUNTER — Encounter (INDEPENDENT_AMBULATORY_CARE_PROVIDER_SITE_OTHER): Payer: Self-pay | Admitting: Otolaryngology

## 2024-05-10 ENCOUNTER — Encounter (INDEPENDENT_AMBULATORY_CARE_PROVIDER_SITE_OTHER): Payer: Self-pay

## 2024-05-10 ENCOUNTER — Ambulatory Visit (INDEPENDENT_AMBULATORY_CARE_PROVIDER_SITE_OTHER): Admitting: Otolaryngology

## 2024-05-10 VITALS — BP 150/69 | HR 76 | Ht 61.0 in | Wt 126.0 lb

## 2024-05-10 DIAGNOSIS — H903 Sensorineural hearing loss, bilateral: Secondary | ICD-10-CM

## 2024-05-10 DIAGNOSIS — M26623 Arthralgia of bilateral temporomandibular joint: Secondary | ICD-10-CM | POA: Diagnosis not present

## 2024-05-10 NOTE — Progress Notes (Signed)
 Reason for Consult: Hoarseness Referring Physician: Dr. Mahlon Dickey Rachel Walton is an 89 y.o. female.  HPI: She is here for evaluation of several things.  She has a popping and cracking in both ears that has been present for many years.  She is bothered by it when she chews or yawns.  She feels a fullness in both ears.  She has a decreased hearing in both ears.  No tinnitus.  No vertigo.  She has not had any treatment or medication for her ears.  She has no nasal symptoms of congestion, obstruction, or epistaxis.  She also has a hoarse voice complaint but it is really a tremorous voice.  She has a normal voice many times but then there is a tremor sound.  She has a significant tremor of her head and neck and hands.  It is not clear what the etiology of this is.  She has no dysphagia or odynophagia.  No sore throat.  The caretaker agrees that it is not hoarseness it is the tremor sound.  Past Medical History:  Diagnosis Date   Acute gastric ulcer    Allergy    Anxiety    Atrophic vaginitis    Barrett's esophagus    Basal cell carcinoma of nose     under nose   Cataract    Depression    Diverticulosis    Esophageal stricture    Esophageal ulcer    Fatty liver    Gall stones 06/15/2013   GERD (gastroesophageal reflux disease)    Heart murmur    Hiatal hernia    Hx of adenomatous colonic polyps    Hyperlipidemia    Hypertension    Hypokalemia    IBS (irritable bowel syndrome)    Kyphosis    Lymphocytic colitis    Macular degeneration    ?wet or dry; ? 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    Tremor    Vitamin D  deficiency     Past Surgical History:  Procedure Laterality Date   ABDOMINAL HYSTERECTOMY  1980   APPENDECTOMY     BACK SURGERY     BREAST BIOPSY Left    CARPAL TUNNEL RELEASE Left 09/13/2020   Procedure: CARPAL TUNNEL  RELEASE;  Surgeon: Camella Fallow, MD;  Location: [facility] OR;  Service: Orthopedics;  Laterality: Left;   CATARACT EXTRACTION W/ INTRAOCULAR LENS  IMPLANT, BILATERAL Bilateral    COLONOSCOPY     ESOPHAGOGASTRODUODENOSCOPY (EGD) WITH ESOPHAGEAL DILATION  X 1   EYE SURGERY     LUMBAR DISC SURGERY     fragmented disc   ORIF RADIAL FRACTURE Left 09/13/2020   Procedure: HARDWARE REMOVAL, OPEN REDUCTION INTERNAL FIXATION LEFT RADIUS;  Surgeon: Camella Fallow, MD;  Location: [facility] OR;  Service: Orthopedics;  Laterality: Left;  Block with IV sedation   POLYPECTOMY     SQUAMOUS CELL CARCINOMA EXCISION     nose tip    Family History  Problem Relation Age of Onset   Stroke Mother    Heart disease Father        MI   Heart attack Father    Cancer Sister        Brain tumor   Hypertension Sister    Osteoporosis Sister    Hypertension Sister    Breast cancer  Sister 88   Hypertension Sister    Osteoporosis Sister    Hypertension Sister    Hypertension Sister    Stroke Sister    Hypertension Sister    Hypertension Sister    Colon cancer Brother    Heart disease Brother    Diabetes Son 8   Diabetes Son 66   Brain cancer Son        deceased   Cancer Son        brain   Diabetes Maternal Uncle    Colon cancer Other        nephew   Thyroid  cancer Other        niece   Breast cancer Other    Breast cancer Other    Breast cancer Other    Colon polyps Neg Hx    Esophageal cancer Neg Hx    Rectal cancer Neg Hx    Stomach cancer Neg Hx     Social History:  reports that she has never smoked. She has never used smokeless tobacco. She reports that she does not drink alcohol and does not use drugs.  Allergies: Allergies[1]   No results found for this or any previous visit (from the past 48 hours).  No results found.  ROS There were no vitals taken for this visit. Physical Exam Constitutional:      Appearance: Normal appearance.  HENT:     Head: Normocephalic and atraumatic.  Both  TMJs have crepitance and pain with palpation.  The crepitance is fairly significant bilaterally.    Right Ear: Tympanic membrane is without lesions and middle ear aerated, ear canal and external ear normal.     Left Ear: Tympanic membrane is without lesions and middle ear aerated, ear canal and external ear normal.     Nose: Nose without deviation of septum.  Turbinates with mild hypertrophy, No significant swelling or masses.     Oral cavity/oropharynx: Mucous membranes are moist. No lesions or masses    Larynx: normal voice except occasionally I do hear the tremorous sound.  The whole time that she is in the office her head is constantly rocking back-and-forth with her systemic tremor.  Her hands are also tremorous.  When I looked in her ears the tremor gets worse. Mirror attempted without success    Eyes:     Extraocular Movements: Extraocular movements intact.     Conjunctiva/sclera: Conjunctivae normal.     Pupils: Pupils are equal, round, and reactive to light.  Cardiovascular:     Rate and Rhythm: Normal rate.  Pulmonary:     Effort: Pulmonary effort is normal.  Musculoskeletal:     Cervical back: Normal range of motion and neck supple. No rigidity.  Lymphadenopathy:     Cervical: No cervical adenopathy or masses.salivary glands without lesions. .     Salivary glands- no mass or swelling Neurological:     Mental Status: He is alert. CN 2-12 intact. No nystagmus      Assessment/Plan: Sensorineural hearing loss-she has hearing loss bilaterally most likely and will get an audiogram to see if she can benefit from hearing aids.  Ear fullness-this is almost certainly due to her TMJ problem.  She has no nasal symptoms to suggest that she would have eustachian tube dysfunction.  She has significant crepitance and discomfort in both TMJs.  I will send her to oral surgery for evaluation.  Hoarseness-this is not really hoarseness but rather a tremorous voice consistent with her other  neurologic tremor  she has in her head and neck.  The caretaker agrees there is no hoarseness that her voice sounds normal most of the time.  Norleen Notice 05/10/2024, 10:13 AM         [1]  Allergies Allergen Reactions   Alendronate Sodium Other (See Comments)    Chest pain   Lexapro  [Escitalopram  Oxalate] Other (See Comments)    Makes lymphangitic colitis worse   Amoxicillin  Other (See Comments)    diarrhea   Aspirin  Other (See Comments)    Per the patient, made the gastric ulcer worse.   Budesonide  Other (See Comments)    Blurry vision, shaking inside her body   Clindamycin /Lincomycin Other (See Comments)    unknown   Keflex [Cephalexin] Other (See Comments)    unknown   Levofloxacin  Other (See Comments)    Insomnia and headache   Prednisone  Other (See Comments)    Nervousness   Sudafed [Pseudoephedrine Hcl] Other (See Comments)    insomnia   Sulfa Antibiotics Hives   Toprol Xl [Metoprolol Succinate] Other (See Comments)    Unknown reaction   Trimethoprim     Unknown reaction   Zithromax  [Azithromycin ] Rash

## 2024-05-17 ENCOUNTER — Encounter: Admitting: Family Medicine

## 2024-05-18 ENCOUNTER — Encounter: Admitting: Family Medicine

## 2024-05-29 ENCOUNTER — Other Ambulatory Visit: Payer: Self-pay | Admitting: Family Medicine

## 2024-05-29 DIAGNOSIS — I10 Essential (primary) hypertension: Secondary | ICD-10-CM

## 2024-06-02 ENCOUNTER — Other Ambulatory Visit: Payer: Self-pay | Admitting: Family Medicine

## 2024-06-02 DIAGNOSIS — F419 Anxiety disorder, unspecified: Secondary | ICD-10-CM

## 2024-06-05 ENCOUNTER — Other Ambulatory Visit: Payer: Self-pay | Admitting: *Deleted

## 2024-06-05 DIAGNOSIS — F419 Anxiety disorder, unspecified: Secondary | ICD-10-CM

## 2024-06-05 NOTE — Telephone Encounter (Unsigned)
 Copied from CRM #[id number]. Topic: Clinical - Medication Refill >> Jun 05, 2024 11:38 AM Delon HERO wrote: Medication: clonazePAM  (KLONOPIN ) 0.5 MG tablet [[id number]]  Has the patient contacted their pharmacy? Yes (Agent: If no, request that the patient contact the pharmacy for the refill. If patient does not wish to contact the pharmacy document the reason why and proceed with request.) (Agent: If yes, when and what did the pharmacy advise?)  This is the patient's preferred pharmacy:  [facility], [place] - [address] [place] [address] Phone: [phone] Fax: [phone]  Is this the correct pharmacy for this prescription? Yes If no, delete pharmacy and type the correct one.   Has the prescription been filled recently? Yes  Is the patient out of the medication? Yes  Has the patient been seen for an appointment in the last year OR does the patient have an upcoming appointment? Yes  Can we respond through MyChart? Yes  Agent: Please be advised that Rx refills may take up to 3 business days. We ask that you follow-up with your pharmacy.

## 2024-06-06 NOTE — Telephone Encounter (Signed)
 Informed that requested refill is for a controlled medication that PCP has not prescribed and pt has signed a contract and will have to be seen. Appt made for 06/08/24

## 2024-06-08 ENCOUNTER — Encounter: Payer: Self-pay | Admitting: Family Medicine

## 2024-06-08 ENCOUNTER — Ambulatory Visit: Admitting: Family Medicine

## 2024-06-08 VITALS — BP 121/68 | HR 76 | Temp 97.4°F | Ht 61.0 in | Wt 121.6 lb

## 2024-06-08 DIAGNOSIS — G250 Essential tremor: Secondary | ICD-10-CM | POA: Diagnosis not present

## 2024-06-08 DIAGNOSIS — E538 Deficiency of other specified B group vitamins: Secondary | ICD-10-CM | POA: Diagnosis not present

## 2024-06-08 DIAGNOSIS — F4320 Adjustment disorder, unspecified: Secondary | ICD-10-CM | POA: Diagnosis not present

## 2024-06-08 DIAGNOSIS — F0394 Unspecified dementia, unspecified severity, with anxiety: Secondary | ICD-10-CM | POA: Diagnosis not present

## 2024-06-08 DIAGNOSIS — F419 Anxiety disorder, unspecified: Secondary | ICD-10-CM

## 2024-06-08 DIAGNOSIS — I10 Essential (primary) hypertension: Secondary | ICD-10-CM | POA: Diagnosis not present

## 2024-06-08 DIAGNOSIS — E785 Hyperlipidemia, unspecified: Secondary | ICD-10-CM | POA: Diagnosis not present

## 2024-06-08 DIAGNOSIS — K219 Gastro-esophageal reflux disease without esophagitis: Secondary | ICD-10-CM

## 2024-06-08 MED ORDER — SERTRALINE HCL 50 MG PO TABS: 50.0000 mg | ORAL_TABLET | Freq: Every day | ORAL | 1 refills | Status: AC

## 2024-06-08 MED ORDER — PANTOPRAZOLE SODIUM 40 MG PO TBEC: 40.0000 mg | DELAYED_RELEASE_TABLET | Freq: Every day | ORAL | 1 refills | Status: AC

## 2024-06-08 MED ORDER — ROSUVASTATIN CALCIUM 10 MG PO TABS: 10.0000 mg | ORAL_TABLET | Freq: Every day | ORAL | 1 refills | Status: AC

## 2024-06-08 MED ORDER — LISINOPRIL 20 MG PO TABS: 20.0000 mg | ORAL_TABLET | Freq: Every day | ORAL | 1 refills | Status: AC

## 2024-06-08 MED ORDER — CLONAZEPAM 0.5 MG PO TABS: ORAL_TABLET | ORAL | 2 refills | Status: AC

## 2024-06-08 NOTE — Progress Notes (Signed)
 "  Established Patient Office Visit  Patient ID: Rachel Walton, female    DOB: 11-20-1929  Age: 89 y.o. MRN: [id number] PCP: Alcus Oneil ORN, FNP  Chief Complaint  Patient presents with   Medication Refill    Subjective:     Medication Refill    Discussed the use of AI scribe software for clinical note transcription with the patient, who gave verbal consent to proceed.  History of Present Illness   Rachel Walton is a 89 year old female who presents for medication refills.  Megan caretaker is present.  Reduced appetite - Caregiver concerned about reduced appetite and has been working on having the patient eat three meals per day.   Neurocognitive symptoms and tremor - Mental state is stable but experiences some memory loss sometimes.  - History of negative reaction to dementia medication, including hallucinations and confusion - Caregiver administers half a tablet in the morning and half at night - Tremors worsen without Klonopin  - Has upcoming neurology appointment on 08/01/2024  Depression - Currently taking Zoloft  50 mg for depression - Patient still has some grief over the loss of her husband.  Hypertension - Currently taking lisinopril  for blood pressure management  Hyperlipidemia - Currently taking Crestor  for cholesterol management  Gastrointestinal symptoms - Currently taking Protonix  for gastrointestinal issues - Last GI appointment was on 04/04/2024 with follow-up recommended in 1 year.  Nutritional supplementation - Taking potassium supplement, B12 supplement, and over-the-counter fish oil       ROS    Objective:     BP 121/68 (Cuff Size: Normal)   Pulse 76   Temp (!) 97.4 F (36.3 C)   Ht 5' 1 (1.549 m)   Wt 121 lb 9.6 oz (55.2 kg)   SpO2 94%   BMI 22.98 kg/m    Physical Exam Vitals reviewed.  Constitutional:      Appearance: Normal appearance.  HENT:     Head: Normocephalic and atraumatic.     Comments: Mild tremor to head  observed  Eyes:     Extraocular Movements: Extraocular movements intact.     Conjunctiva/sclera: Conjunctivae normal.     Pupils: Pupils are equal, round, and reactive to light.  Cardiovascular:     Rate and Rhythm: Normal rate and regular rhythm.     Pulses: Normal pulses.     Heart sounds: Normal heart sounds. No murmur heard. Pulmonary:     Effort: Pulmonary effort is normal. No respiratory distress.     Breath sounds: Normal breath sounds.  Musculoskeletal:        General: No swelling. Normal range of motion.     Cervical back: Normal range of motion.  Skin:    General: Skin is warm and dry.  Neurological:     General: No focal deficit present.     Mental Status: She is alert and oriented to person, place, and time.  Psychiatric:        Mood and Affect: Mood normal.        Behavior: Behavior normal.      No results found for any visits on 06/08/24.    The ASCVD Risk score (Arnett DK, et al., 2019) failed to calculate for the following reasons:   The 2019 ASCVD risk score is only valid for ages 62 to 86   * - Cholesterol units were assumed    Assessment & Plan:   Problem List Items Addressed This Visit       Nervous and Auditory  Essential tremor     Other   Anxiety   Relevant Medications   sertraline  (ZOLOFT ) 50 MG tablet   clonazePAM  (KLONOPIN ) 0.5 MG tablet   Other Visit Diagnoses       Dementia with anxiety, unspecified dementia severity, unspecified dementia type (HCC)    -  Primary   Relevant Medications   sertraline  (ZOLOFT ) 50 MG tablet   clonazePAM  (KLONOPIN ) 0.5 MG tablet     Essential hypertension       Relevant Medications   lisinopril  (ZESTRIL ) 20 MG tablet   rosuvastatin  (CRESTOR ) 10 MG tablet     Hyperlipidemia, unspecified hyperlipidemia type       Relevant Medications   lisinopril  (ZESTRIL ) 20 MG tablet   rosuvastatin  (CRESTOR ) 10 MG tablet     Gastroesophageal reflux disease, unspecified whether esophagitis present       Relevant  Medications   pantoprazole  (PROTONIX ) 40 MG tablet     Grief reaction       Relevant Medications   sertraline  (ZOLOFT ) 50 MG tablet     Vitamin B12 deficiency       Relevant Orders   Vitamin B12       Assessment and Plan    Dementia, tremor, and grief Zoloft  improved mood. Clonazepam  used for tremors - Has neurology appointment on 08/01/2024 - Continue Zoloft  50 mg daily. - Continue current clonazepam  regimen until neurology consultation.  Discussed that benzodiazepine use can worsen dementia symptoms, patient would like to continue with medication at this time. Patient has been on medication long-term and weaning off may be difficult.   Hypertension Well-controlled with lisinopril . Blood pressure excellent. - Continue lisinopril  as prescribed. - Refilled lisinopril  prescription.  Gastroesophageal reflux disease Managed with Protonix . - Continue Protonix  as prescribed. - Refilled Protonix  prescription.  Vitamin B12 deficiency - Recheck B12 levels. - Currently taking over-the-counter B12 supplementation.  May need to adjust based on lab results.  Reduced appetite - Discussed reduced appetite in aging adults.  - Continue to offer 3 meals per day.  - Will monitor weight.  - F/u if appetite or weight continues to decrease.        Return in about 3 months (around 09/06/2024).    Oneil LELON Severin, FNP [facility]   "

## 2024-06-09 ENCOUNTER — Ambulatory Visit: Payer: Self-pay | Admitting: Family Medicine

## 2024-06-09 LAB — VITAMIN B12: Vitamin B-12: 1246 pg/mL — ABNORMAL HIGH (ref 232–1245)

## 2024-06-14 ENCOUNTER — Ambulatory Visit (INDEPENDENT_AMBULATORY_CARE_PROVIDER_SITE_OTHER): Admitting: Audiology

## 2024-06-14 NOTE — Progress Notes (Unsigned)
" °  [address] [place], [place] [address] [phone]  Audiological Evaluation    Name: Rachel Walton     DOB:   06-17-1929      MRN:   [id number]                                                                                     Service Date: 06/14/2024     Accompanied by: self ***   Patient comes today after {ENT providers:31223} sent a referral for a hearing evaluation due to concerns with {audiology symptoms:31224}.   Symptoms Yes Details  Hearing loss  []    Tinnitus  []    Ear pain/ infections/pressure  []    Balance problems  []    Noise exposure history  []    Previous ear surgeries  []    Family history of hearing loss  []    Amplification  []    Other  []      Otoscopy: Right ear: {otoscopy:31227} Left ear:  {otoscopy:31227}  Tympanometry: Right ear: {tympanometry results:31367} Left ear: {tympanometry results:31367}  ***  Hearing Evaluation The hearing test results were completed {transducer options:31388} and results are deemed to be of {test reliability:31390::good reliability}. Test technique:  {audiometric test technique:31400::conventional}    Pure tone Audiometry: Right ear- *** {hearing loss types:31372::sensorineural hearing loss} from *** Hz - *** Hz. Left ear-  *** {hearing loss types:31372::sensorineural hearing loss} from *** Hz - *** Hz.  Speech Audiometry: Right ear- {AUD SRT/SAT:19348}. Left ear-{AUD SRT/SAT:19348}.   Word Recognition Score Tested using {word lists:31376::NU-6 (recorded)} Right ear: ***% was obtained at a presentation level of *** dBHL with contralateral masking which is deemed as  {word recognition score:31373}. Left ear: ***% was obtained at a presentation level of *** dBHL with contralateral masking which is deemed as  {word recognition score:31373}.   Impression: {Word recognition Score interpretation:31432::There is not a significant difference in pure-tone thresholds between ears.,There is not a  significant difference in the word recognition score in between ears. }   Recommendations: {Audiology Recommendations:31370::Follow up with ENT as scheduled.}   Arlean Rake, AUD  "

## 2024-07-18 ENCOUNTER — Ambulatory Visit (INDEPENDENT_AMBULATORY_CARE_PROVIDER_SITE_OTHER): Admitting: Audiology

## 2024-08-01 ENCOUNTER — Ambulatory Visit: Admitting: Family Medicine

## 2024-08-03 ENCOUNTER — Ambulatory Visit: Payer: Self-pay | Admitting: Neurology

## 2024-09-06 ENCOUNTER — Ambulatory Visit: Admitting: Family Medicine
